# Patient Record
Sex: Female | Born: 1945 | Race: White | Hispanic: No | Marital: Married | Smoking: Former smoker
Health system: Southern US, Community
[De-identification: ages and names within clinical notes are randomized; demographics above are authoritative.]

## PROBLEM LIST (undated history)

## (undated) DIAGNOSIS — D509 Iron deficiency anemia, unspecified: Secondary | ICD-10-CM

## (undated) DIAGNOSIS — F329 Major depressive disorder, single episode, unspecified: Secondary | ICD-10-CM

## (undated) DIAGNOSIS — E785 Hyperlipidemia, unspecified: Secondary | ICD-10-CM

## (undated) DIAGNOSIS — Z8719 Personal history of other diseases of the digestive system: Secondary | ICD-10-CM

## (undated) DIAGNOSIS — C50019 Malignant neoplasm of nipple and areola, unspecified female breast: Secondary | ICD-10-CM

## (undated) DIAGNOSIS — N393 Stress incontinence (female) (male): Secondary | ICD-10-CM

## (undated) DIAGNOSIS — R21 Rash and other nonspecific skin eruption: Secondary | ICD-10-CM

## (undated) DIAGNOSIS — J449 Chronic obstructive pulmonary disease, unspecified: Secondary | ICD-10-CM

## (undated) DIAGNOSIS — I201 Angina pectoris with documented spasm: Secondary | ICD-10-CM

## (undated) DIAGNOSIS — S92912A Unspecified fracture of left toe(s), initial encounter for closed fracture: Secondary | ICD-10-CM

## (undated) DIAGNOSIS — I209 Angina pectoris, unspecified: Secondary | ICD-10-CM

## (undated) DIAGNOSIS — R0600 Dyspnea, unspecified: Secondary | ICD-10-CM

## (undated) DIAGNOSIS — D126 Benign neoplasm of colon, unspecified: Secondary | ICD-10-CM

## (undated) DIAGNOSIS — R7303 Prediabetes: Secondary | ICD-10-CM

## (undated) DIAGNOSIS — M810 Age-related osteoporosis without current pathological fracture: Secondary | ICD-10-CM

## (undated) DIAGNOSIS — I639 Cerebral infarction, unspecified: Secondary | ICD-10-CM

## (undated) DIAGNOSIS — F32A Depression, unspecified: Secondary | ICD-10-CM

## (undated) DIAGNOSIS — C50919 Malignant neoplasm of unspecified site of unspecified female breast: Secondary | ICD-10-CM

## (undated) DIAGNOSIS — K219 Gastro-esophageal reflux disease without esophagitis: Secondary | ICD-10-CM

## (undated) DIAGNOSIS — E669 Obesity, unspecified: Secondary | ICD-10-CM

## (undated) HISTORY — DX: Age-related osteoporosis without current pathological fracture: M81.0

## (undated) HISTORY — DX: Gastro-esophageal reflux disease without esophagitis: K21.9

## (undated) HISTORY — DX: Hyperlipidemia, unspecified: E78.5

## (undated) HISTORY — PX: CATARACT EXTRACTION: SUR2

## (undated) HISTORY — DX: Obesity, unspecified: E66.9

## (undated) HISTORY — DX: Major depressive disorder, single episode, unspecified: F32.9

## (undated) HISTORY — DX: Unspecified fracture of left toe(s), initial encounter for closed fracture: S92.912A

## (undated) HISTORY — DX: Malignant neoplasm of nipple and areola, unspecified female breast: C50.019

## (undated) HISTORY — PX: TONSILLECTOMY: SUR1361

## (undated) HISTORY — DX: Benign neoplasm of colon, unspecified: D12.6

## (undated) HISTORY — PX: RADIAL KERATOTOMY: SHX217

## (undated) HISTORY — DX: Iron deficiency anemia, unspecified: D50.9

## (undated) HISTORY — DX: Cerebral infarction, unspecified: I63.9

## (undated) HISTORY — DX: Depression, unspecified: F32.A

## (undated) SURGERY — Surgical Case
Anesthesia: *Unknown

---

## 1987-03-21 HISTORY — PX: ABDOMINAL HYSTERECTOMY: SHX81

## 2002-09-16 ENCOUNTER — Inpatient Hospital Stay (HOSPITAL_COMMUNITY): Admission: EM | Admit: 2002-09-16 | Discharge: 2002-09-18 | Payer: Self-pay | Admitting: Emergency Medicine

## 2002-09-16 ENCOUNTER — Encounter: Payer: Self-pay | Admitting: Emergency Medicine

## 2002-09-18 ENCOUNTER — Encounter: Payer: Self-pay | Admitting: Cardiology

## 2003-03-21 DIAGNOSIS — C50919 Malignant neoplasm of unspecified site of unspecified female breast: Secondary | ICD-10-CM

## 2003-03-21 HISTORY — DX: Malignant neoplasm of unspecified site of unspecified female breast: C50.919

## 2003-03-21 HISTORY — PX: MASTECTOMY: SHX3

## 2005-12-29 ENCOUNTER — Emergency Department (HOSPITAL_COMMUNITY): Admission: EM | Admit: 2005-12-29 | Discharge: 2005-12-29 | Payer: Self-pay | Admitting: Family Medicine

## 2011-03-10 ENCOUNTER — Encounter: Payer: Self-pay | Admitting: *Deleted

## 2011-03-10 ENCOUNTER — Emergency Department (INDEPENDENT_AMBULATORY_CARE_PROVIDER_SITE_OTHER)
Admission: EM | Admit: 2011-03-10 | Discharge: 2011-03-10 | Disposition: A | Discharge status: Home / Self Care | Payer: Federal, State, Local not specified - PPO | Source: Home / Self Care | Attending: Family Medicine | Admitting: Family Medicine

## 2011-03-10 DIAGNOSIS — H16002 Unspecified corneal ulcer, left eye: Secondary | ICD-10-CM

## 2011-03-10 DIAGNOSIS — H16009 Unspecified corneal ulcer, unspecified eye: Secondary | ICD-10-CM

## 2011-03-10 HISTORY — DX: Chronic obstructive pulmonary disease, unspecified: J44.9

## 2011-03-10 HISTORY — DX: Angina pectoris with documented spasm: I20.1

## 2011-03-10 HISTORY — DX: Malignant neoplasm of unspecified site of unspecified female breast: C50.919

## 2011-03-10 MED ORDER — ERYTHROMYCIN 5 MG/GM OP OINT: TOPICAL_OINTMENT | Freq: Every day | OPHTHALMIC | Status: AC

## 2011-03-10 MED ORDER — POLYMYXIN B-TRIMETHOPRIM 10000-0.1 UNIT/ML-% OP SOLN: 1.0000 [drp] | OPHTHALMIC | Status: AC

## 2011-03-10 NOTE — ED Provider Notes (Signed)
[name] is a 65 year old woman with left eye pain sensitivity and discharge. One month ago she had conjunctivitis of the left eye and has had symptoms off and on since then. She lives in [place] and is down here visiting family. She notes that her eye worsen dramatically this morning. She doesn't think she scratched it or to any foreign body in it. She has been using moisturizer eyedrops on her eyes. Her pertinent medical history for high is  Astigmatism, and  radial keratotomy.  Her past medical history significant for COPD, diabetes, Prinzmetal's angina and a history of breast cancer status post mastectomy.  She is a drug allergy to second-generation cephalosporins which cause a headache.  She feels well otherwise and has no change in her visual acuity. Additionally she denies any pain with eye motion and denies any fevers or chills.  PMH reviewed.  ROS as above otherwise neg Medications reviewed. No current facility-administered medications for this encounter.   Current Outpatient Prescriptions  Medication Sig Dispense Refill  . amLODipine (NORVASC) 10 MG tablet Take 10 mg by mouth daily.        Marland Kitchen aspirin 81 MG tablet Take 81 mg by mouth daily.        Marland Kitchen buPROPion (WELLBUTRIN) 100 MG tablet Take 100 mg by mouth daily.        . Calcium Carbonate-Vitamin D (OSCAL 500/200 D-3 PO) Take by mouth.        . ISOSORBIDE PO Take by mouth daily.        . metFORMIN (GLUCOPHAGE-XR) 500 MG 24 hr tablet Take 500 mg by mouth daily with breakfast. Take 2 daily       . metoprolol succinate (TOPROL-XL) 12.5 mg TB24 Take by mouth daily.        . Multiple Vitamins-Minerals (MULTIVITAMIN PO) Take by mouth.        . RABEprazole (ACIPHEX) 20 MG tablet Take 20 mg by mouth 2 (two) times daily.        . ramipril (ALTACE) 2.5 MG tablet Take 2.5 mg by mouth daily.        . rosuvastatin (CRESTOR) 40 MG tablet Take 40 mg by mouth daily.        Marland Kitchen erythromycin [facility]) ophthalmic ointment Place into the left eye at  bedtime.  3.5 g  0  . trimethoprim-polymyxin b (POLYTRIM) ophthalmic solution Place 1 drop into the left eye every 4 (four) hours.  10 mL  0     Exam:  BP 119/53  Pulse 86  Temp(Src) 98.6 F (37 C) (Oral)  Resp 16  SpO2 95% Gen: Well NAD HEENT: EOMI,  MMM EYE: Slitlamp exam reveals conjunctival injection of the medial aspect of the left eye. Additionally there is a small circular area on the cornea that has fluorescein uptake consistent with a corneal ulcer. The area is just inferior to the border of the iris in the 6:00 position . There is no dendritic pattern. No foreign body seen  Lungs: CTABL Nl WOB Heart: RRR no MRG Abd: NABS, NT, ND Exts: Non edematous BL  LE, warm and well perfused.    Assessment and plan: 65 year old woman with corneal ulcer, versus abrasion. Plan to treat with erythromycin ointment at night and Polytrim eye drops during the day.  I recommend that she followup with her ophthalmologist upon return to [place] in one week. If her pain worsens or she is changes in vision I recommend that she see an ophthalmologist sooner.  Handout on corneal  ulcer and red flag precautions reviewed with patient who expresses understanding.    Clementeen Graham 03/10/11 2016

## 2011-03-10 NOTE — ED Notes (Signed)
Had pink eye in left eye approx 1 month ago; "I've had problems with it ever since".  Has been using artificial tears, but irritation is getting worse in left eye.  C/O weeping eye w/ "gunky" drainage in AM.

## 2011-03-11 NOTE — ED Provider Notes (Signed)
Medical screening examination/treatment/procedure(s) were performed by [facility] family medicine resident physician and as supervising physician I was immediately available for consultation/collaboration.   [facility]; MD   Sharin Grave, MD 03/11/11 [phone]

## 2011-03-21 DIAGNOSIS — I639 Cerebral infarction, unspecified: Secondary | ICD-10-CM

## 2011-03-21 HISTORY — DX: Cerebral infarction, unspecified: I63.9

## 2011-04-19 DIAGNOSIS — E785 Hyperlipidemia, unspecified: Secondary | ICD-10-CM | POA: Diagnosis not present

## 2011-04-19 DIAGNOSIS — Z79899 Other long term (current) drug therapy: Secondary | ICD-10-CM | POA: Diagnosis not present

## 2011-04-25 DIAGNOSIS — K219 Gastro-esophageal reflux disease without esophagitis: Secondary | ICD-10-CM | POA: Diagnosis not present

## 2011-05-10 DIAGNOSIS — Z4682 Encounter for fitting and adjustment of non-vascular catheter: Secondary | ICD-10-CM | POA: Diagnosis not present

## 2011-05-10 DIAGNOSIS — Z880 Allergy status to penicillin: Secondary | ICD-10-CM | POA: Diagnosis not present

## 2011-05-10 DIAGNOSIS — G8190 Hemiplegia, unspecified affecting unspecified side: Secondary | ICD-10-CM | POA: Diagnosis not present

## 2011-05-10 DIAGNOSIS — D509 Iron deficiency anemia, unspecified: Secondary | ICD-10-CM | POA: Diagnosis present

## 2011-05-10 DIAGNOSIS — J449 Chronic obstructive pulmonary disease, unspecified: Secondary | ICD-10-CM | POA: Diagnosis present

## 2011-05-10 DIAGNOSIS — R29810 Facial weakness: Secondary | ICD-10-CM | POA: Diagnosis not present

## 2011-05-10 DIAGNOSIS — I6789 Other cerebrovascular disease: Secondary | ICD-10-CM | POA: Diagnosis not present

## 2011-05-10 DIAGNOSIS — I252 Old myocardial infarction: Secondary | ICD-10-CM | POA: Diagnosis not present

## 2011-05-10 DIAGNOSIS — E46 Unspecified protein-calorie malnutrition: Secondary | ICD-10-CM | POA: Diagnosis not present

## 2011-05-10 DIAGNOSIS — I10 Essential (primary) hypertension: Secondary | ICD-10-CM | POA: Diagnosis not present

## 2011-05-10 DIAGNOSIS — R4701 Aphasia: Secondary | ICD-10-CM | POA: Diagnosis not present

## 2011-05-10 DIAGNOSIS — I69959 Hemiplegia and hemiparesis following unspecified cerebrovascular disease affecting unspecified side: Secondary | ICD-10-CM | POA: Diagnosis not present

## 2011-05-10 DIAGNOSIS — K5730 Diverticulosis of large intestine without perforation or abscess without bleeding: Secondary | ICD-10-CM | POA: Diagnosis not present

## 2011-05-10 DIAGNOSIS — I669 Occlusion and stenosis of unspecified cerebral artery: Secondary | ICD-10-CM | POA: Diagnosis not present

## 2011-05-10 DIAGNOSIS — K922 Gastrointestinal hemorrhage, unspecified: Secondary | ICD-10-CM | POA: Diagnosis not present

## 2011-05-10 DIAGNOSIS — K219 Gastro-esophageal reflux disease without esophagitis: Secondary | ICD-10-CM | POA: Diagnosis present

## 2011-05-10 DIAGNOSIS — K449 Diaphragmatic hernia without obstruction or gangrene: Secondary | ICD-10-CM | POA: Diagnosis not present

## 2011-05-10 DIAGNOSIS — E785 Hyperlipidemia, unspecified: Secondary | ICD-10-CM | POA: Diagnosis not present

## 2011-05-10 DIAGNOSIS — Z8673 Personal history of transient ischemic attack (TIA), and cerebral infarction without residual deficits: Secondary | ICD-10-CM | POA: Diagnosis not present

## 2011-05-10 DIAGNOSIS — I6350 Cerebral infarction due to unspecified occlusion or stenosis of unspecified cerebral artery: Secondary | ICD-10-CM | POA: Diagnosis not present

## 2011-05-10 DIAGNOSIS — E119 Type 2 diabetes mellitus without complications: Secondary | ICD-10-CM | POA: Diagnosis not present

## 2011-05-10 DIAGNOSIS — R627 Adult failure to thrive: Secondary | ICD-10-CM | POA: Diagnosis not present

## 2011-05-10 DIAGNOSIS — R1310 Dysphagia, unspecified: Secondary | ICD-10-CM | POA: Diagnosis not present

## 2011-05-22 DIAGNOSIS — Z931 Gastrostomy status: Secondary | ICD-10-CM | POA: Diagnosis not present

## 2011-05-22 DIAGNOSIS — G8190 Hemiplegia, unspecified affecting unspecified side: Secondary | ICD-10-CM | POA: Diagnosis not present

## 2011-05-22 DIAGNOSIS — G519 Disorder of facial nerve, unspecified: Secondary | ICD-10-CM | POA: Diagnosis not present

## 2011-05-22 DIAGNOSIS — I69991 Dysphagia following unspecified cerebrovascular disease: Secondary | ICD-10-CM | POA: Diagnosis not present

## 2011-05-22 DIAGNOSIS — R1310 Dysphagia, unspecified: Secondary | ICD-10-CM | POA: Diagnosis not present

## 2011-05-22 DIAGNOSIS — J449 Chronic obstructive pulmonary disease, unspecified: Secondary | ICD-10-CM | POA: Diagnosis not present

## 2011-05-22 DIAGNOSIS — I252 Old myocardial infarction: Secondary | ICD-10-CM | POA: Diagnosis not present

## 2011-05-22 DIAGNOSIS — R4701 Aphasia: Secondary | ICD-10-CM | POA: Diagnosis not present

## 2011-05-22 DIAGNOSIS — I69920 Aphasia following unspecified cerebrovascular disease: Secondary | ICD-10-CM | POA: Diagnosis not present

## 2011-05-22 DIAGNOSIS — Z5189 Encounter for other specified aftercare: Secondary | ICD-10-CM | POA: Diagnosis not present

## 2011-05-22 DIAGNOSIS — I6350 Cerebral infarction due to unspecified occlusion or stenosis of unspecified cerebral artery: Secondary | ICD-10-CM | POA: Diagnosis not present

## 2011-05-22 DIAGNOSIS — D509 Iron deficiency anemia, unspecified: Secondary | ICD-10-CM | POA: Diagnosis not present

## 2011-05-22 DIAGNOSIS — K219 Gastro-esophageal reflux disease without esophagitis: Secondary | ICD-10-CM | POA: Diagnosis not present

## 2011-05-22 DIAGNOSIS — I2510 Atherosclerotic heart disease of native coronary artery without angina pectoris: Secondary | ICD-10-CM | POA: Diagnosis not present

## 2011-05-22 DIAGNOSIS — E119 Type 2 diabetes mellitus without complications: Secondary | ICD-10-CM | POA: Diagnosis not present

## 2011-05-22 DIAGNOSIS — I69922 Dysarthria following unspecified cerebrovascular disease: Secondary | ICD-10-CM | POA: Diagnosis not present

## 2011-05-22 DIAGNOSIS — I6789 Other cerebrovascular disease: Secondary | ICD-10-CM | POA: Diagnosis not present

## 2011-05-22 DIAGNOSIS — I69959 Hemiplegia and hemiparesis following unspecified cerebrovascular disease affecting unspecified side: Secondary | ICD-10-CM | POA: Diagnosis not present

## 2011-05-22 DIAGNOSIS — F329 Major depressive disorder, single episode, unspecified: Secondary | ICD-10-CM | POA: Diagnosis not present

## 2011-06-30 DIAGNOSIS — I10 Essential (primary) hypertension: Secondary | ICD-10-CM | POA: Diagnosis not present

## 2011-06-30 DIAGNOSIS — E785 Hyperlipidemia, unspecified: Secondary | ICD-10-CM | POA: Diagnosis not present

## 2011-06-30 DIAGNOSIS — I6789 Other cerebrovascular disease: Secondary | ICD-10-CM | POA: Diagnosis not present

## 2011-07-04 DIAGNOSIS — E119 Type 2 diabetes mellitus without complications: Secondary | ICD-10-CM | POA: Diagnosis not present

## 2011-07-04 DIAGNOSIS — I6789 Other cerebrovascular disease: Secondary | ICD-10-CM | POA: Diagnosis not present

## 2011-07-04 DIAGNOSIS — J449 Chronic obstructive pulmonary disease, unspecified: Secondary | ICD-10-CM | POA: Diagnosis not present

## 2011-07-04 DIAGNOSIS — R5381 Other malaise: Secondary | ICD-10-CM | POA: Diagnosis not present

## 2011-07-05 DIAGNOSIS — F329 Major depressive disorder, single episode, unspecified: Secondary | ICD-10-CM | POA: Diagnosis not present

## 2011-07-07 DIAGNOSIS — I6789 Other cerebrovascular disease: Secondary | ICD-10-CM | POA: Diagnosis not present

## 2011-07-07 DIAGNOSIS — R110 Nausea: Secondary | ICD-10-CM | POA: Diagnosis not present

## 2011-07-07 DIAGNOSIS — J449 Chronic obstructive pulmonary disease, unspecified: Secondary | ICD-10-CM | POA: Diagnosis not present

## 2011-07-07 DIAGNOSIS — E119 Type 2 diabetes mellitus without complications: Secondary | ICD-10-CM | POA: Diagnosis not present

## 2011-07-07 DIAGNOSIS — Z79899 Other long term (current) drug therapy: Secondary | ICD-10-CM | POA: Diagnosis not present

## 2011-07-10 DIAGNOSIS — I6789 Other cerebrovascular disease: Secondary | ICD-10-CM | POA: Diagnosis not present

## 2011-07-10 DIAGNOSIS — R5383 Other fatigue: Secondary | ICD-10-CM | POA: Diagnosis not present

## 2011-07-10 DIAGNOSIS — J449 Chronic obstructive pulmonary disease, unspecified: Secondary | ICD-10-CM | POA: Diagnosis not present

## 2011-07-10 DIAGNOSIS — E119 Type 2 diabetes mellitus without complications: Secondary | ICD-10-CM | POA: Diagnosis not present

## 2011-07-10 DIAGNOSIS — R5381 Other malaise: Secondary | ICD-10-CM | POA: Diagnosis not present

## 2011-07-12 DIAGNOSIS — F0151 Vascular dementia with behavioral disturbance: Secondary | ICD-10-CM | POA: Diagnosis not present

## 2011-07-14 DIAGNOSIS — I10 Essential (primary) hypertension: Secondary | ICD-10-CM | POA: Diagnosis not present

## 2011-07-14 DIAGNOSIS — I6789 Other cerebrovascular disease: Secondary | ICD-10-CM | POA: Diagnosis not present

## 2011-07-14 DIAGNOSIS — I2510 Atherosclerotic heart disease of native coronary artery without angina pectoris: Secondary | ICD-10-CM | POA: Diagnosis not present

## 2011-07-14 DIAGNOSIS — J449 Chronic obstructive pulmonary disease, unspecified: Secondary | ICD-10-CM | POA: Diagnosis not present

## 2011-07-19 DIAGNOSIS — J449 Chronic obstructive pulmonary disease, unspecified: Secondary | ICD-10-CM | POA: Diagnosis not present

## 2011-07-19 DIAGNOSIS — I6789 Other cerebrovascular disease: Secondary | ICD-10-CM | POA: Diagnosis not present

## 2011-07-19 DIAGNOSIS — E119 Type 2 diabetes mellitus without complications: Secondary | ICD-10-CM | POA: Diagnosis not present

## 2011-07-19 DIAGNOSIS — F0151 Vascular dementia with behavioral disturbance: Secondary | ICD-10-CM | POA: Diagnosis not present

## 2011-07-19 DIAGNOSIS — R110 Nausea: Secondary | ICD-10-CM | POA: Diagnosis not present

## 2011-07-26 DIAGNOSIS — E119 Type 2 diabetes mellitus without complications: Secondary | ICD-10-CM | POA: Diagnosis not present

## 2011-07-26 DIAGNOSIS — I6789 Other cerebrovascular disease: Secondary | ICD-10-CM | POA: Diagnosis not present

## 2011-07-26 DIAGNOSIS — H1045 Other chronic allergic conjunctivitis: Secondary | ICD-10-CM | POA: Diagnosis not present

## 2011-07-26 DIAGNOSIS — J449 Chronic obstructive pulmonary disease, unspecified: Secondary | ICD-10-CM | POA: Diagnosis not present

## 2011-07-27 DIAGNOSIS — J449 Chronic obstructive pulmonary disease, unspecified: Secondary | ICD-10-CM | POA: Diagnosis not present

## 2011-07-27 DIAGNOSIS — E119 Type 2 diabetes mellitus without complications: Secondary | ICD-10-CM | POA: Diagnosis not present

## 2011-07-27 DIAGNOSIS — H1045 Other chronic allergic conjunctivitis: Secondary | ICD-10-CM | POA: Diagnosis not present

## 2011-07-27 DIAGNOSIS — I6789 Other cerebrovascular disease: Secondary | ICD-10-CM | POA: Diagnosis not present

## 2011-08-01 DIAGNOSIS — F329 Major depressive disorder, single episode, unspecified: Secondary | ICD-10-CM | POA: Diagnosis not present

## 2011-08-01 DIAGNOSIS — I6789 Other cerebrovascular disease: Secondary | ICD-10-CM | POA: Diagnosis not present

## 2011-08-02 DIAGNOSIS — R5383 Other fatigue: Secondary | ICD-10-CM | POA: Diagnosis not present

## 2011-08-02 DIAGNOSIS — E119 Type 2 diabetes mellitus without complications: Secondary | ICD-10-CM | POA: Diagnosis not present

## 2011-08-02 DIAGNOSIS — R609 Edema, unspecified: Secondary | ICD-10-CM | POA: Diagnosis not present

## 2011-08-02 DIAGNOSIS — I6789 Other cerebrovascular disease: Secondary | ICD-10-CM | POA: Diagnosis not present

## 2011-08-08 DIAGNOSIS — R5381 Other malaise: Secondary | ICD-10-CM | POA: Diagnosis not present

## 2011-08-08 DIAGNOSIS — R609 Edema, unspecified: Secondary | ICD-10-CM | POA: Diagnosis not present

## 2011-08-08 DIAGNOSIS — I6789 Other cerebrovascular disease: Secondary | ICD-10-CM | POA: Diagnosis not present

## 2011-08-08 DIAGNOSIS — E119 Type 2 diabetes mellitus without complications: Secondary | ICD-10-CM | POA: Diagnosis not present

## 2011-08-08 DIAGNOSIS — R5383 Other fatigue: Secondary | ICD-10-CM | POA: Diagnosis not present

## 2011-08-11 DIAGNOSIS — H04129 Dry eye syndrome of unspecified lacrimal gland: Secondary | ICD-10-CM | POA: Diagnosis not present

## 2011-08-11 DIAGNOSIS — H02409 Unspecified ptosis of unspecified eyelid: Secondary | ICD-10-CM | POA: Diagnosis not present

## 2011-08-11 DIAGNOSIS — H179 Unspecified corneal scar and opacity: Secondary | ICD-10-CM | POA: Diagnosis not present

## 2011-08-11 DIAGNOSIS — H2510 Age-related nuclear cataract, unspecified eye: Secondary | ICD-10-CM | POA: Diagnosis not present

## 2011-08-11 DIAGNOSIS — H01009 Unspecified blepharitis unspecified eye, unspecified eyelid: Secondary | ICD-10-CM | POA: Diagnosis not present

## 2011-08-11 DIAGNOSIS — H16269 Vernal keratoconjunctivitis, with limbar and corneal involvement, unspecified eye: Secondary | ICD-10-CM | POA: Diagnosis not present

## 2011-08-15 DIAGNOSIS — E119 Type 2 diabetes mellitus without complications: Secondary | ICD-10-CM | POA: Diagnosis not present

## 2011-08-15 DIAGNOSIS — R609 Edema, unspecified: Secondary | ICD-10-CM | POA: Diagnosis not present

## 2011-08-15 DIAGNOSIS — I6789 Other cerebrovascular disease: Secondary | ICD-10-CM | POA: Diagnosis not present

## 2011-08-15 DIAGNOSIS — R5381 Other malaise: Secondary | ICD-10-CM | POA: Diagnosis not present

## 2011-08-17 DIAGNOSIS — H16129 Filamentary keratitis, unspecified eye: Secondary | ICD-10-CM | POA: Diagnosis not present

## 2011-08-17 DIAGNOSIS — H2510 Age-related nuclear cataract, unspecified eye: Secondary | ICD-10-CM | POA: Diagnosis not present

## 2011-08-17 DIAGNOSIS — H01009 Unspecified blepharitis unspecified eye, unspecified eyelid: Secondary | ICD-10-CM | POA: Diagnosis not present

## 2011-08-17 DIAGNOSIS — H179 Unspecified corneal scar and opacity: Secondary | ICD-10-CM | POA: Diagnosis not present

## 2011-08-17 DIAGNOSIS — H04129 Dry eye syndrome of unspecified lacrimal gland: Secondary | ICD-10-CM | POA: Diagnosis not present

## 2011-08-21 DIAGNOSIS — Z993 Dependence on wheelchair: Secondary | ICD-10-CM | POA: Diagnosis not present

## 2011-08-21 DIAGNOSIS — I69959 Hemiplegia and hemiparesis following unspecified cerebrovascular disease affecting unspecified side: Secondary | ICD-10-CM | POA: Diagnosis not present

## 2011-08-21 DIAGNOSIS — I2510 Atherosclerotic heart disease of native coronary artery without angina pectoris: Secondary | ICD-10-CM | POA: Diagnosis not present

## 2011-08-21 DIAGNOSIS — R5381 Other malaise: Secondary | ICD-10-CM | POA: Diagnosis not present

## 2011-08-21 DIAGNOSIS — M79609 Pain in unspecified limb: Secondary | ICD-10-CM | POA: Diagnosis not present

## 2011-08-21 DIAGNOSIS — E785 Hyperlipidemia, unspecified: Secondary | ICD-10-CM | POA: Diagnosis not present

## 2011-08-21 DIAGNOSIS — R5383 Other fatigue: Secondary | ICD-10-CM | POA: Diagnosis not present

## 2011-08-22 DIAGNOSIS — I69959 Hemiplegia and hemiparesis following unspecified cerebrovascular disease affecting unspecified side: Secondary | ICD-10-CM | POA: Diagnosis not present

## 2011-08-22 DIAGNOSIS — Z993 Dependence on wheelchair: Secondary | ICD-10-CM | POA: Diagnosis not present

## 2011-08-22 DIAGNOSIS — R5383 Other fatigue: Secondary | ICD-10-CM | POA: Diagnosis not present

## 2011-08-22 DIAGNOSIS — M79609 Pain in unspecified limb: Secondary | ICD-10-CM | POA: Diagnosis not present

## 2011-08-22 DIAGNOSIS — E785 Hyperlipidemia, unspecified: Secondary | ICD-10-CM | POA: Diagnosis not present

## 2011-08-22 DIAGNOSIS — I2510 Atherosclerotic heart disease of native coronary artery without angina pectoris: Secondary | ICD-10-CM | POA: Diagnosis not present

## 2011-08-23 DIAGNOSIS — I2510 Atherosclerotic heart disease of native coronary artery without angina pectoris: Secondary | ICD-10-CM | POA: Diagnosis not present

## 2011-08-23 DIAGNOSIS — Z993 Dependence on wheelchair: Secondary | ICD-10-CM | POA: Diagnosis not present

## 2011-08-23 DIAGNOSIS — M79609 Pain in unspecified limb: Secondary | ICD-10-CM | POA: Diagnosis not present

## 2011-08-23 DIAGNOSIS — R5383 Other fatigue: Secondary | ICD-10-CM | POA: Diagnosis not present

## 2011-08-23 DIAGNOSIS — I69959 Hemiplegia and hemiparesis following unspecified cerebrovascular disease affecting unspecified side: Secondary | ICD-10-CM | POA: Diagnosis not present

## 2011-08-23 DIAGNOSIS — E785 Hyperlipidemia, unspecified: Secondary | ICD-10-CM | POA: Diagnosis not present

## 2011-08-24 DIAGNOSIS — I2510 Atherosclerotic heart disease of native coronary artery without angina pectoris: Secondary | ICD-10-CM | POA: Diagnosis not present

## 2011-08-24 DIAGNOSIS — E785 Hyperlipidemia, unspecified: Secondary | ICD-10-CM | POA: Diagnosis not present

## 2011-08-24 DIAGNOSIS — R5381 Other malaise: Secondary | ICD-10-CM | POA: Diagnosis not present

## 2011-08-24 DIAGNOSIS — I69959 Hemiplegia and hemiparesis following unspecified cerebrovascular disease affecting unspecified side: Secondary | ICD-10-CM | POA: Diagnosis not present

## 2011-08-24 DIAGNOSIS — I6789 Other cerebrovascular disease: Secondary | ICD-10-CM | POA: Diagnosis not present

## 2011-08-24 DIAGNOSIS — M79609 Pain in unspecified limb: Secondary | ICD-10-CM | POA: Diagnosis not present

## 2011-08-24 DIAGNOSIS — Z993 Dependence on wheelchair: Secondary | ICD-10-CM | POA: Diagnosis not present

## 2011-08-28 DIAGNOSIS — Z993 Dependence on wheelchair: Secondary | ICD-10-CM | POA: Diagnosis not present

## 2011-08-28 DIAGNOSIS — E785 Hyperlipidemia, unspecified: Secondary | ICD-10-CM | POA: Diagnosis not present

## 2011-08-28 DIAGNOSIS — I69959 Hemiplegia and hemiparesis following unspecified cerebrovascular disease affecting unspecified side: Secondary | ICD-10-CM | POA: Diagnosis not present

## 2011-08-28 DIAGNOSIS — I2510 Atherosclerotic heart disease of native coronary artery without angina pectoris: Secondary | ICD-10-CM | POA: Diagnosis not present

## 2011-08-28 DIAGNOSIS — R5383 Other fatigue: Secondary | ICD-10-CM | POA: Diagnosis not present

## 2011-08-28 DIAGNOSIS — M79609 Pain in unspecified limb: Secondary | ICD-10-CM | POA: Diagnosis not present

## 2011-08-29 DIAGNOSIS — R5383 Other fatigue: Secondary | ICD-10-CM | POA: Diagnosis not present

## 2011-08-29 DIAGNOSIS — E785 Hyperlipidemia, unspecified: Secondary | ICD-10-CM | POA: Diagnosis not present

## 2011-08-29 DIAGNOSIS — Z993 Dependence on wheelchair: Secondary | ICD-10-CM | POA: Diagnosis not present

## 2011-08-29 DIAGNOSIS — I2510 Atherosclerotic heart disease of native coronary artery without angina pectoris: Secondary | ICD-10-CM | POA: Diagnosis not present

## 2011-08-29 DIAGNOSIS — M79609 Pain in unspecified limb: Secondary | ICD-10-CM | POA: Diagnosis not present

## 2011-08-29 DIAGNOSIS — I69959 Hemiplegia and hemiparesis following unspecified cerebrovascular disease affecting unspecified side: Secondary | ICD-10-CM | POA: Diagnosis not present

## 2011-08-30 DIAGNOSIS — Z993 Dependence on wheelchair: Secondary | ICD-10-CM | POA: Diagnosis not present

## 2011-08-30 DIAGNOSIS — E785 Hyperlipidemia, unspecified: Secondary | ICD-10-CM | POA: Diagnosis not present

## 2011-08-30 DIAGNOSIS — M79609 Pain in unspecified limb: Secondary | ICD-10-CM | POA: Diagnosis not present

## 2011-08-30 DIAGNOSIS — R5383 Other fatigue: Secondary | ICD-10-CM | POA: Diagnosis not present

## 2011-08-30 DIAGNOSIS — I69959 Hemiplegia and hemiparesis following unspecified cerebrovascular disease affecting unspecified side: Secondary | ICD-10-CM | POA: Diagnosis not present

## 2011-08-30 DIAGNOSIS — I2510 Atherosclerotic heart disease of native coronary artery without angina pectoris: Secondary | ICD-10-CM | POA: Diagnosis not present

## 2011-08-31 DIAGNOSIS — I69959 Hemiplegia and hemiparesis following unspecified cerebrovascular disease affecting unspecified side: Secondary | ICD-10-CM | POA: Diagnosis not present

## 2011-08-31 DIAGNOSIS — I2510 Atherosclerotic heart disease of native coronary artery without angina pectoris: Secondary | ICD-10-CM | POA: Diagnosis not present

## 2011-08-31 DIAGNOSIS — R5381 Other malaise: Secondary | ICD-10-CM | POA: Diagnosis not present

## 2011-08-31 DIAGNOSIS — Z993 Dependence on wheelchair: Secondary | ICD-10-CM | POA: Diagnosis not present

## 2011-08-31 DIAGNOSIS — M79609 Pain in unspecified limb: Secondary | ICD-10-CM | POA: Diagnosis not present

## 2011-08-31 DIAGNOSIS — E785 Hyperlipidemia, unspecified: Secondary | ICD-10-CM | POA: Diagnosis not present

## 2011-09-04 DIAGNOSIS — E559 Vitamin D deficiency, unspecified: Secondary | ICD-10-CM | POA: Diagnosis not present

## 2011-09-04 DIAGNOSIS — I6789 Other cerebrovascular disease: Secondary | ICD-10-CM | POA: Diagnosis not present

## 2011-09-04 DIAGNOSIS — R7309 Other abnormal glucose: Secondary | ICD-10-CM | POA: Diagnosis not present

## 2011-09-04 DIAGNOSIS — E538 Deficiency of other specified B group vitamins: Secondary | ICD-10-CM | POA: Diagnosis not present

## 2011-09-04 DIAGNOSIS — E785 Hyperlipidemia, unspecified: Secondary | ICD-10-CM | POA: Diagnosis not present

## 2011-09-05 DIAGNOSIS — Z993 Dependence on wheelchair: Secondary | ICD-10-CM | POA: Diagnosis not present

## 2011-09-05 DIAGNOSIS — I69959 Hemiplegia and hemiparesis following unspecified cerebrovascular disease affecting unspecified side: Secondary | ICD-10-CM | POA: Diagnosis not present

## 2011-09-05 DIAGNOSIS — R5383 Other fatigue: Secondary | ICD-10-CM | POA: Diagnosis not present

## 2011-09-05 DIAGNOSIS — E785 Hyperlipidemia, unspecified: Secondary | ICD-10-CM | POA: Diagnosis not present

## 2011-09-05 DIAGNOSIS — M79609 Pain in unspecified limb: Secondary | ICD-10-CM | POA: Diagnosis not present

## 2011-09-05 DIAGNOSIS — I2510 Atherosclerotic heart disease of native coronary artery without angina pectoris: Secondary | ICD-10-CM | POA: Diagnosis not present

## 2011-09-07 DIAGNOSIS — H01009 Unspecified blepharitis unspecified eye, unspecified eyelid: Secondary | ICD-10-CM | POA: Diagnosis not present

## 2011-09-07 DIAGNOSIS — H04129 Dry eye syndrome of unspecified lacrimal gland: Secondary | ICD-10-CM | POA: Diagnosis not present

## 2011-09-07 DIAGNOSIS — I2510 Atherosclerotic heart disease of native coronary artery without angina pectoris: Secondary | ICD-10-CM | POA: Diagnosis not present

## 2011-09-07 DIAGNOSIS — R5383 Other fatigue: Secondary | ICD-10-CM | POA: Diagnosis not present

## 2011-09-07 DIAGNOSIS — Z993 Dependence on wheelchair: Secondary | ICD-10-CM | POA: Diagnosis not present

## 2011-09-07 DIAGNOSIS — H2510 Age-related nuclear cataract, unspecified eye: Secondary | ICD-10-CM | POA: Diagnosis not present

## 2011-09-07 DIAGNOSIS — I69959 Hemiplegia and hemiparesis following unspecified cerebrovascular disease affecting unspecified side: Secondary | ICD-10-CM | POA: Diagnosis not present

## 2011-09-07 DIAGNOSIS — R5381 Other malaise: Secondary | ICD-10-CM | POA: Diagnosis not present

## 2011-09-07 DIAGNOSIS — E785 Hyperlipidemia, unspecified: Secondary | ICD-10-CM | POA: Diagnosis not present

## 2011-09-07 DIAGNOSIS — M79609 Pain in unspecified limb: Secondary | ICD-10-CM | POA: Diagnosis not present

## 2011-09-08 DIAGNOSIS — R5383 Other fatigue: Secondary | ICD-10-CM | POA: Diagnosis not present

## 2011-09-08 DIAGNOSIS — I69959 Hemiplegia and hemiparesis following unspecified cerebrovascular disease affecting unspecified side: Secondary | ICD-10-CM | POA: Diagnosis not present

## 2011-09-08 DIAGNOSIS — I2510 Atherosclerotic heart disease of native coronary artery without angina pectoris: Secondary | ICD-10-CM | POA: Diagnosis not present

## 2011-09-08 DIAGNOSIS — Z993 Dependence on wheelchair: Secondary | ICD-10-CM | POA: Diagnosis not present

## 2011-09-08 DIAGNOSIS — E785 Hyperlipidemia, unspecified: Secondary | ICD-10-CM | POA: Diagnosis not present

## 2011-09-08 DIAGNOSIS — M79609 Pain in unspecified limb: Secondary | ICD-10-CM | POA: Diagnosis not present

## 2011-09-11 DIAGNOSIS — Z993 Dependence on wheelchair: Secondary | ICD-10-CM | POA: Diagnosis not present

## 2011-09-11 DIAGNOSIS — I2510 Atherosclerotic heart disease of native coronary artery without angina pectoris: Secondary | ICD-10-CM | POA: Diagnosis not present

## 2011-09-11 DIAGNOSIS — I69959 Hemiplegia and hemiparesis following unspecified cerebrovascular disease affecting unspecified side: Secondary | ICD-10-CM | POA: Diagnosis not present

## 2011-09-11 DIAGNOSIS — E119 Type 2 diabetes mellitus without complications: Secondary | ICD-10-CM | POA: Diagnosis not present

## 2011-09-11 DIAGNOSIS — M79609 Pain in unspecified limb: Secondary | ICD-10-CM | POA: Diagnosis not present

## 2011-09-11 DIAGNOSIS — E785 Hyperlipidemia, unspecified: Secondary | ICD-10-CM | POA: Diagnosis not present

## 2011-09-11 DIAGNOSIS — R5381 Other malaise: Secondary | ICD-10-CM | POA: Diagnosis not present

## 2011-09-12 DIAGNOSIS — I2510 Atherosclerotic heart disease of native coronary artery without angina pectoris: Secondary | ICD-10-CM | POA: Diagnosis not present

## 2011-09-12 DIAGNOSIS — I69959 Hemiplegia and hemiparesis following unspecified cerebrovascular disease affecting unspecified side: Secondary | ICD-10-CM | POA: Diagnosis not present

## 2011-09-12 DIAGNOSIS — M79609 Pain in unspecified limb: Secondary | ICD-10-CM | POA: Diagnosis not present

## 2011-09-12 DIAGNOSIS — R5381 Other malaise: Secondary | ICD-10-CM | POA: Diagnosis not present

## 2011-09-12 DIAGNOSIS — E785 Hyperlipidemia, unspecified: Secondary | ICD-10-CM | POA: Diagnosis not present

## 2011-09-12 DIAGNOSIS — R5383 Other fatigue: Secondary | ICD-10-CM | POA: Diagnosis not present

## 2011-09-12 DIAGNOSIS — Z993 Dependence on wheelchair: Secondary | ICD-10-CM | POA: Diagnosis not present

## 2011-09-15 DIAGNOSIS — I2510 Atherosclerotic heart disease of native coronary artery without angina pectoris: Secondary | ICD-10-CM | POA: Diagnosis not present

## 2011-09-15 DIAGNOSIS — I69959 Hemiplegia and hemiparesis following unspecified cerebrovascular disease affecting unspecified side: Secondary | ICD-10-CM | POA: Diagnosis not present

## 2011-09-15 DIAGNOSIS — M79609 Pain in unspecified limb: Secondary | ICD-10-CM | POA: Diagnosis not present

## 2011-09-15 DIAGNOSIS — Z993 Dependence on wheelchair: Secondary | ICD-10-CM | POA: Diagnosis not present

## 2011-09-15 DIAGNOSIS — R5383 Other fatigue: Secondary | ICD-10-CM | POA: Diagnosis not present

## 2011-09-15 DIAGNOSIS — E785 Hyperlipidemia, unspecified: Secondary | ICD-10-CM | POA: Diagnosis not present

## 2011-09-25 DIAGNOSIS — I10 Essential (primary) hypertension: Secondary | ICD-10-CM | POA: Diagnosis not present

## 2011-09-25 DIAGNOSIS — I252 Old myocardial infarction: Secondary | ICD-10-CM | POA: Diagnosis not present

## 2011-09-25 DIAGNOSIS — I69959 Hemiplegia and hemiparesis following unspecified cerebrovascular disease affecting unspecified side: Secondary | ICD-10-CM | POA: Diagnosis not present

## 2011-09-25 DIAGNOSIS — I69993 Ataxia following unspecified cerebrovascular disease: Secondary | ICD-10-CM | POA: Diagnosis not present

## 2011-09-25 DIAGNOSIS — R262 Difficulty in walking, not elsewhere classified: Secondary | ICD-10-CM | POA: Diagnosis not present

## 2011-09-25 DIAGNOSIS — I2510 Atherosclerotic heart disease of native coronary artery without angina pectoris: Secondary | ICD-10-CM | POA: Diagnosis not present

## 2011-09-25 DIAGNOSIS — R269 Unspecified abnormalities of gait and mobility: Secondary | ICD-10-CM | POA: Diagnosis not present

## 2011-09-25 DIAGNOSIS — I69998 Other sequelae following unspecified cerebrovascular disease: Secondary | ICD-10-CM | POA: Diagnosis not present

## 2011-09-25 DIAGNOSIS — M6281 Muscle weakness (generalized): Secondary | ICD-10-CM | POA: Diagnosis not present

## 2011-09-25 DIAGNOSIS — IMO0001 Reserved for inherently not codable concepts without codable children: Secondary | ICD-10-CM | POA: Diagnosis not present

## 2011-09-25 DIAGNOSIS — E785 Hyperlipidemia, unspecified: Secondary | ICD-10-CM | POA: Diagnosis not present

## 2011-10-03 DIAGNOSIS — R262 Difficulty in walking, not elsewhere classified: Secondary | ICD-10-CM | POA: Diagnosis not present

## 2011-10-03 DIAGNOSIS — I69998 Other sequelae following unspecified cerebrovascular disease: Secondary | ICD-10-CM | POA: Diagnosis not present

## 2011-10-03 DIAGNOSIS — M6281 Muscle weakness (generalized): Secondary | ICD-10-CM | POA: Diagnosis not present

## 2011-10-03 DIAGNOSIS — IMO0001 Reserved for inherently not codable concepts without codable children: Secondary | ICD-10-CM | POA: Diagnosis not present

## 2011-10-03 DIAGNOSIS — R269 Unspecified abnormalities of gait and mobility: Secondary | ICD-10-CM | POA: Diagnosis not present

## 2011-10-03 DIAGNOSIS — I69993 Ataxia following unspecified cerebrovascular disease: Secondary | ICD-10-CM | POA: Diagnosis not present

## 2011-10-05 DIAGNOSIS — I69993 Ataxia following unspecified cerebrovascular disease: Secondary | ICD-10-CM | POA: Diagnosis not present

## 2011-10-05 DIAGNOSIS — IMO0001 Reserved for inherently not codable concepts without codable children: Secondary | ICD-10-CM | POA: Diagnosis not present

## 2011-10-05 DIAGNOSIS — I69998 Other sequelae following unspecified cerebrovascular disease: Secondary | ICD-10-CM | POA: Diagnosis not present

## 2011-10-05 DIAGNOSIS — M6281 Muscle weakness (generalized): Secondary | ICD-10-CM | POA: Diagnosis not present

## 2011-10-05 DIAGNOSIS — R269 Unspecified abnormalities of gait and mobility: Secondary | ICD-10-CM | POA: Diagnosis not present

## 2011-10-05 DIAGNOSIS — R262 Difficulty in walking, not elsewhere classified: Secondary | ICD-10-CM | POA: Diagnosis not present

## 2011-10-06 DIAGNOSIS — I69998 Other sequelae following unspecified cerebrovascular disease: Secondary | ICD-10-CM | POA: Diagnosis not present

## 2011-10-06 DIAGNOSIS — I69993 Ataxia following unspecified cerebrovascular disease: Secondary | ICD-10-CM | POA: Diagnosis not present

## 2011-10-06 DIAGNOSIS — IMO0001 Reserved for inherently not codable concepts without codable children: Secondary | ICD-10-CM | POA: Diagnosis not present

## 2011-10-06 DIAGNOSIS — M6281 Muscle weakness (generalized): Secondary | ICD-10-CM | POA: Diagnosis not present

## 2011-10-06 DIAGNOSIS — R269 Unspecified abnormalities of gait and mobility: Secondary | ICD-10-CM | POA: Diagnosis not present

## 2011-10-06 DIAGNOSIS — R262 Difficulty in walking, not elsewhere classified: Secondary | ICD-10-CM | POA: Diagnosis not present

## 2011-10-09 DIAGNOSIS — E785 Hyperlipidemia, unspecified: Secondary | ICD-10-CM | POA: Diagnosis not present

## 2011-10-10 DIAGNOSIS — R262 Difficulty in walking, not elsewhere classified: Secondary | ICD-10-CM | POA: Diagnosis not present

## 2011-10-10 DIAGNOSIS — I69998 Other sequelae following unspecified cerebrovascular disease: Secondary | ICD-10-CM | POA: Diagnosis not present

## 2011-10-10 DIAGNOSIS — M6281 Muscle weakness (generalized): Secondary | ICD-10-CM | POA: Diagnosis not present

## 2011-10-10 DIAGNOSIS — R269 Unspecified abnormalities of gait and mobility: Secondary | ICD-10-CM | POA: Diagnosis not present

## 2011-10-10 DIAGNOSIS — I69993 Ataxia following unspecified cerebrovascular disease: Secondary | ICD-10-CM | POA: Diagnosis not present

## 2011-10-10 DIAGNOSIS — IMO0001 Reserved for inherently not codable concepts without codable children: Secondary | ICD-10-CM | POA: Diagnosis not present

## 2011-10-12 DIAGNOSIS — I69998 Other sequelae following unspecified cerebrovascular disease: Secondary | ICD-10-CM | POA: Diagnosis not present

## 2011-10-12 DIAGNOSIS — R262 Difficulty in walking, not elsewhere classified: Secondary | ICD-10-CM | POA: Diagnosis not present

## 2011-10-12 DIAGNOSIS — M6281 Muscle weakness (generalized): Secondary | ICD-10-CM | POA: Diagnosis not present

## 2011-10-12 DIAGNOSIS — R269 Unspecified abnormalities of gait and mobility: Secondary | ICD-10-CM | POA: Diagnosis not present

## 2011-10-12 DIAGNOSIS — I69993 Ataxia following unspecified cerebrovascular disease: Secondary | ICD-10-CM | POA: Diagnosis not present

## 2011-10-12 DIAGNOSIS — IMO0001 Reserved for inherently not codable concepts without codable children: Secondary | ICD-10-CM | POA: Diagnosis not present

## 2011-10-13 DIAGNOSIS — I69993 Ataxia following unspecified cerebrovascular disease: Secondary | ICD-10-CM | POA: Diagnosis not present

## 2011-10-13 DIAGNOSIS — R262 Difficulty in walking, not elsewhere classified: Secondary | ICD-10-CM | POA: Diagnosis not present

## 2011-10-13 DIAGNOSIS — R269 Unspecified abnormalities of gait and mobility: Secondary | ICD-10-CM | POA: Diagnosis not present

## 2011-10-13 DIAGNOSIS — I69998 Other sequelae following unspecified cerebrovascular disease: Secondary | ICD-10-CM | POA: Diagnosis not present

## 2011-10-13 DIAGNOSIS — M6281 Muscle weakness (generalized): Secondary | ICD-10-CM | POA: Diagnosis not present

## 2011-10-13 DIAGNOSIS — IMO0001 Reserved for inherently not codable concepts without codable children: Secondary | ICD-10-CM | POA: Diagnosis not present

## 2011-10-17 DIAGNOSIS — R269 Unspecified abnormalities of gait and mobility: Secondary | ICD-10-CM | POA: Diagnosis not present

## 2011-10-17 DIAGNOSIS — M6281 Muscle weakness (generalized): Secondary | ICD-10-CM | POA: Diagnosis not present

## 2011-10-17 DIAGNOSIS — R262 Difficulty in walking, not elsewhere classified: Secondary | ICD-10-CM | POA: Diagnosis not present

## 2011-10-17 DIAGNOSIS — IMO0001 Reserved for inherently not codable concepts without codable children: Secondary | ICD-10-CM | POA: Diagnosis not present

## 2011-10-17 DIAGNOSIS — I69993 Ataxia following unspecified cerebrovascular disease: Secondary | ICD-10-CM | POA: Diagnosis not present

## 2011-10-17 DIAGNOSIS — I69998 Other sequelae following unspecified cerebrovascular disease: Secondary | ICD-10-CM | POA: Diagnosis not present

## 2011-10-18 DIAGNOSIS — I69993 Ataxia following unspecified cerebrovascular disease: Secondary | ICD-10-CM | POA: Diagnosis not present

## 2011-10-18 DIAGNOSIS — I69998 Other sequelae following unspecified cerebrovascular disease: Secondary | ICD-10-CM | POA: Diagnosis not present

## 2011-10-18 DIAGNOSIS — R269 Unspecified abnormalities of gait and mobility: Secondary | ICD-10-CM | POA: Diagnosis not present

## 2011-10-18 DIAGNOSIS — IMO0001 Reserved for inherently not codable concepts without codable children: Secondary | ICD-10-CM | POA: Diagnosis not present

## 2011-10-18 DIAGNOSIS — M6281 Muscle weakness (generalized): Secondary | ICD-10-CM | POA: Diagnosis not present

## 2011-10-18 DIAGNOSIS — R262 Difficulty in walking, not elsewhere classified: Secondary | ICD-10-CM | POA: Diagnosis not present

## 2011-10-20 DIAGNOSIS — R269 Unspecified abnormalities of gait and mobility: Secondary | ICD-10-CM | POA: Diagnosis not present

## 2011-10-20 DIAGNOSIS — I69959 Hemiplegia and hemiparesis following unspecified cerebrovascular disease affecting unspecified side: Secondary | ICD-10-CM | POA: Diagnosis not present

## 2011-10-20 DIAGNOSIS — IMO0001 Reserved for inherently not codable concepts without codable children: Secondary | ICD-10-CM | POA: Diagnosis not present

## 2011-10-20 DIAGNOSIS — I69993 Ataxia following unspecified cerebrovascular disease: Secondary | ICD-10-CM | POA: Diagnosis not present

## 2011-10-20 DIAGNOSIS — M6289 Other specified disorders of muscle: Secondary | ICD-10-CM | POA: Diagnosis not present

## 2011-10-20 DIAGNOSIS — M6281 Muscle weakness (generalized): Secondary | ICD-10-CM | POA: Diagnosis not present

## 2011-10-20 DIAGNOSIS — R262 Difficulty in walking, not elsewhere classified: Secondary | ICD-10-CM | POA: Diagnosis not present

## 2011-10-20 DIAGNOSIS — I69998 Other sequelae following unspecified cerebrovascular disease: Secondary | ICD-10-CM | POA: Diagnosis not present

## 2011-10-24 DIAGNOSIS — M6289 Other specified disorders of muscle: Secondary | ICD-10-CM | POA: Diagnosis not present

## 2011-10-24 DIAGNOSIS — M6281 Muscle weakness (generalized): Secondary | ICD-10-CM | POA: Diagnosis not present

## 2011-10-24 DIAGNOSIS — I69998 Other sequelae following unspecified cerebrovascular disease: Secondary | ICD-10-CM | POA: Diagnosis not present

## 2011-10-24 DIAGNOSIS — R262 Difficulty in walking, not elsewhere classified: Secondary | ICD-10-CM | POA: Diagnosis not present

## 2011-10-24 DIAGNOSIS — R269 Unspecified abnormalities of gait and mobility: Secondary | ICD-10-CM | POA: Diagnosis not present

## 2011-10-24 DIAGNOSIS — IMO0001 Reserved for inherently not codable concepts without codable children: Secondary | ICD-10-CM | POA: Diagnosis not present

## 2011-11-02 DIAGNOSIS — M6289 Other specified disorders of muscle: Secondary | ICD-10-CM | POA: Diagnosis not present

## 2011-11-02 DIAGNOSIS — M6281 Muscle weakness (generalized): Secondary | ICD-10-CM | POA: Diagnosis not present

## 2011-11-02 DIAGNOSIS — R262 Difficulty in walking, not elsewhere classified: Secondary | ICD-10-CM | POA: Diagnosis not present

## 2011-11-02 DIAGNOSIS — I69998 Other sequelae following unspecified cerebrovascular disease: Secondary | ICD-10-CM | POA: Diagnosis not present

## 2011-11-02 DIAGNOSIS — IMO0001 Reserved for inherently not codable concepts without codable children: Secondary | ICD-10-CM | POA: Diagnosis not present

## 2011-11-02 DIAGNOSIS — R269 Unspecified abnormalities of gait and mobility: Secondary | ICD-10-CM | POA: Diagnosis not present

## 2011-11-07 DIAGNOSIS — M6289 Other specified disorders of muscle: Secondary | ICD-10-CM | POA: Diagnosis not present

## 2011-11-07 DIAGNOSIS — R269 Unspecified abnormalities of gait and mobility: Secondary | ICD-10-CM | POA: Diagnosis not present

## 2011-11-07 DIAGNOSIS — I69998 Other sequelae following unspecified cerebrovascular disease: Secondary | ICD-10-CM | POA: Diagnosis not present

## 2011-11-07 DIAGNOSIS — M6281 Muscle weakness (generalized): Secondary | ICD-10-CM | POA: Diagnosis not present

## 2011-11-07 DIAGNOSIS — IMO0001 Reserved for inherently not codable concepts without codable children: Secondary | ICD-10-CM | POA: Diagnosis not present

## 2011-11-07 DIAGNOSIS — R262 Difficulty in walking, not elsewhere classified: Secondary | ICD-10-CM | POA: Diagnosis not present

## 2011-11-09 DIAGNOSIS — R269 Unspecified abnormalities of gait and mobility: Secondary | ICD-10-CM | POA: Diagnosis not present

## 2011-11-09 DIAGNOSIS — M6281 Muscle weakness (generalized): Secondary | ICD-10-CM | POA: Diagnosis not present

## 2011-11-09 DIAGNOSIS — IMO0001 Reserved for inherently not codable concepts without codable children: Secondary | ICD-10-CM | POA: Diagnosis not present

## 2011-11-09 DIAGNOSIS — I69998 Other sequelae following unspecified cerebrovascular disease: Secondary | ICD-10-CM | POA: Diagnosis not present

## 2011-11-09 DIAGNOSIS — R262 Difficulty in walking, not elsewhere classified: Secondary | ICD-10-CM | POA: Diagnosis not present

## 2011-11-09 DIAGNOSIS — M6289 Other specified disorders of muscle: Secondary | ICD-10-CM | POA: Diagnosis not present

## 2011-11-10 DIAGNOSIS — R269 Unspecified abnormalities of gait and mobility: Secondary | ICD-10-CM | POA: Diagnosis not present

## 2011-11-10 DIAGNOSIS — IMO0001 Reserved for inherently not codable concepts without codable children: Secondary | ICD-10-CM | POA: Diagnosis not present

## 2011-11-10 DIAGNOSIS — M6281 Muscle weakness (generalized): Secondary | ICD-10-CM | POA: Diagnosis not present

## 2011-11-10 DIAGNOSIS — R262 Difficulty in walking, not elsewhere classified: Secondary | ICD-10-CM | POA: Diagnosis not present

## 2011-11-10 DIAGNOSIS — M6289 Other specified disorders of muscle: Secondary | ICD-10-CM | POA: Diagnosis not present

## 2011-11-10 DIAGNOSIS — I69998 Other sequelae following unspecified cerebrovascular disease: Secondary | ICD-10-CM | POA: Diagnosis not present

## 2011-11-14 DIAGNOSIS — M6289 Other specified disorders of muscle: Secondary | ICD-10-CM | POA: Diagnosis not present

## 2011-11-14 DIAGNOSIS — R262 Difficulty in walking, not elsewhere classified: Secondary | ICD-10-CM | POA: Diagnosis not present

## 2011-11-14 DIAGNOSIS — I69998 Other sequelae following unspecified cerebrovascular disease: Secondary | ICD-10-CM | POA: Diagnosis not present

## 2011-11-14 DIAGNOSIS — IMO0001 Reserved for inherently not codable concepts without codable children: Secondary | ICD-10-CM | POA: Diagnosis not present

## 2011-11-14 DIAGNOSIS — R269 Unspecified abnormalities of gait and mobility: Secondary | ICD-10-CM | POA: Diagnosis not present

## 2011-11-14 DIAGNOSIS — M6281 Muscle weakness (generalized): Secondary | ICD-10-CM | POA: Diagnosis not present

## 2011-11-21 DIAGNOSIS — I69993 Ataxia following unspecified cerebrovascular disease: Secondary | ICD-10-CM | POA: Diagnosis not present

## 2011-11-21 DIAGNOSIS — IMO0001 Reserved for inherently not codable concepts without codable children: Secondary | ICD-10-CM | POA: Diagnosis not present

## 2011-11-21 DIAGNOSIS — I69998 Other sequelae following unspecified cerebrovascular disease: Secondary | ICD-10-CM | POA: Diagnosis not present

## 2011-11-21 DIAGNOSIS — R262 Difficulty in walking, not elsewhere classified: Secondary | ICD-10-CM | POA: Diagnosis not present

## 2011-11-21 DIAGNOSIS — I69959 Hemiplegia and hemiparesis following unspecified cerebrovascular disease affecting unspecified side: Secondary | ICD-10-CM | POA: Diagnosis not present

## 2011-11-21 DIAGNOSIS — M6281 Muscle weakness (generalized): Secondary | ICD-10-CM | POA: Diagnosis not present

## 2011-11-21 DIAGNOSIS — R269 Unspecified abnormalities of gait and mobility: Secondary | ICD-10-CM | POA: Diagnosis not present

## 2011-11-23 DIAGNOSIS — I69993 Ataxia following unspecified cerebrovascular disease: Secondary | ICD-10-CM | POA: Diagnosis not present

## 2011-11-23 DIAGNOSIS — R269 Unspecified abnormalities of gait and mobility: Secondary | ICD-10-CM | POA: Diagnosis not present

## 2011-11-23 DIAGNOSIS — IMO0001 Reserved for inherently not codable concepts without codable children: Secondary | ICD-10-CM | POA: Diagnosis not present

## 2011-11-23 DIAGNOSIS — R262 Difficulty in walking, not elsewhere classified: Secondary | ICD-10-CM | POA: Diagnosis not present

## 2011-11-23 DIAGNOSIS — I69959 Hemiplegia and hemiparesis following unspecified cerebrovascular disease affecting unspecified side: Secondary | ICD-10-CM | POA: Diagnosis not present

## 2011-11-23 DIAGNOSIS — I69998 Other sequelae following unspecified cerebrovascular disease: Secondary | ICD-10-CM | POA: Diagnosis not present

## 2011-11-24 DIAGNOSIS — IMO0001 Reserved for inherently not codable concepts without codable children: Secondary | ICD-10-CM | POA: Diagnosis not present

## 2011-11-24 DIAGNOSIS — I69959 Hemiplegia and hemiparesis following unspecified cerebrovascular disease affecting unspecified side: Secondary | ICD-10-CM | POA: Diagnosis not present

## 2011-11-24 DIAGNOSIS — I69993 Ataxia following unspecified cerebrovascular disease: Secondary | ICD-10-CM | POA: Diagnosis not present

## 2011-11-24 DIAGNOSIS — R269 Unspecified abnormalities of gait and mobility: Secondary | ICD-10-CM | POA: Diagnosis not present

## 2011-11-24 DIAGNOSIS — I69998 Other sequelae following unspecified cerebrovascular disease: Secondary | ICD-10-CM | POA: Diagnosis not present

## 2011-11-24 DIAGNOSIS — R262 Difficulty in walking, not elsewhere classified: Secondary | ICD-10-CM | POA: Diagnosis not present

## 2011-11-28 DIAGNOSIS — IMO0001 Reserved for inherently not codable concepts without codable children: Secondary | ICD-10-CM | POA: Diagnosis not present

## 2011-11-28 DIAGNOSIS — R269 Unspecified abnormalities of gait and mobility: Secondary | ICD-10-CM | POA: Diagnosis not present

## 2011-11-28 DIAGNOSIS — I69993 Ataxia following unspecified cerebrovascular disease: Secondary | ICD-10-CM | POA: Diagnosis not present

## 2011-11-28 DIAGNOSIS — I69998 Other sequelae following unspecified cerebrovascular disease: Secondary | ICD-10-CM | POA: Diagnosis not present

## 2011-11-28 DIAGNOSIS — I69959 Hemiplegia and hemiparesis following unspecified cerebrovascular disease affecting unspecified side: Secondary | ICD-10-CM | POA: Diagnosis not present

## 2011-11-28 DIAGNOSIS — R262 Difficulty in walking, not elsewhere classified: Secondary | ICD-10-CM | POA: Diagnosis not present

## 2011-11-30 DIAGNOSIS — I69998 Other sequelae following unspecified cerebrovascular disease: Secondary | ICD-10-CM | POA: Diagnosis not present

## 2011-11-30 DIAGNOSIS — R269 Unspecified abnormalities of gait and mobility: Secondary | ICD-10-CM | POA: Diagnosis not present

## 2011-11-30 DIAGNOSIS — R262 Difficulty in walking, not elsewhere classified: Secondary | ICD-10-CM | POA: Diagnosis not present

## 2011-11-30 DIAGNOSIS — I69993 Ataxia following unspecified cerebrovascular disease: Secondary | ICD-10-CM | POA: Diagnosis not present

## 2011-11-30 DIAGNOSIS — IMO0001 Reserved for inherently not codable concepts without codable children: Secondary | ICD-10-CM | POA: Diagnosis not present

## 2011-11-30 DIAGNOSIS — I69959 Hemiplegia and hemiparesis following unspecified cerebrovascular disease affecting unspecified side: Secondary | ICD-10-CM | POA: Diagnosis not present

## 2011-12-01 DIAGNOSIS — IMO0001 Reserved for inherently not codable concepts without codable children: Secondary | ICD-10-CM | POA: Diagnosis not present

## 2011-12-01 DIAGNOSIS — R269 Unspecified abnormalities of gait and mobility: Secondary | ICD-10-CM | POA: Diagnosis not present

## 2011-12-01 DIAGNOSIS — I69993 Ataxia following unspecified cerebrovascular disease: Secondary | ICD-10-CM | POA: Diagnosis not present

## 2011-12-01 DIAGNOSIS — I69959 Hemiplegia and hemiparesis following unspecified cerebrovascular disease affecting unspecified side: Secondary | ICD-10-CM | POA: Diagnosis not present

## 2011-12-01 DIAGNOSIS — R262 Difficulty in walking, not elsewhere classified: Secondary | ICD-10-CM | POA: Diagnosis not present

## 2011-12-01 DIAGNOSIS — I69998 Other sequelae following unspecified cerebrovascular disease: Secondary | ICD-10-CM | POA: Diagnosis not present

## 2011-12-05 DIAGNOSIS — I69993 Ataxia following unspecified cerebrovascular disease: Secondary | ICD-10-CM | POA: Diagnosis not present

## 2011-12-05 DIAGNOSIS — I69959 Hemiplegia and hemiparesis following unspecified cerebrovascular disease affecting unspecified side: Secondary | ICD-10-CM | POA: Diagnosis not present

## 2011-12-05 DIAGNOSIS — IMO0001 Reserved for inherently not codable concepts without codable children: Secondary | ICD-10-CM | POA: Diagnosis not present

## 2011-12-05 DIAGNOSIS — R262 Difficulty in walking, not elsewhere classified: Secondary | ICD-10-CM | POA: Diagnosis not present

## 2011-12-05 DIAGNOSIS — R269 Unspecified abnormalities of gait and mobility: Secondary | ICD-10-CM | POA: Diagnosis not present

## 2011-12-05 DIAGNOSIS — I69998 Other sequelae following unspecified cerebrovascular disease: Secondary | ICD-10-CM | POA: Diagnosis not present

## 2011-12-08 DIAGNOSIS — I69959 Hemiplegia and hemiparesis following unspecified cerebrovascular disease affecting unspecified side: Secondary | ICD-10-CM | POA: Diagnosis not present

## 2011-12-08 DIAGNOSIS — R269 Unspecified abnormalities of gait and mobility: Secondary | ICD-10-CM | POA: Diagnosis not present

## 2011-12-08 DIAGNOSIS — I69993 Ataxia following unspecified cerebrovascular disease: Secondary | ICD-10-CM | POA: Diagnosis not present

## 2011-12-08 DIAGNOSIS — R262 Difficulty in walking, not elsewhere classified: Secondary | ICD-10-CM | POA: Diagnosis not present

## 2011-12-08 DIAGNOSIS — IMO0001 Reserved for inherently not codable concepts without codable children: Secondary | ICD-10-CM | POA: Diagnosis not present

## 2011-12-08 DIAGNOSIS — I69998 Other sequelae following unspecified cerebrovascular disease: Secondary | ICD-10-CM | POA: Diagnosis not present

## 2011-12-12 DIAGNOSIS — I69959 Hemiplegia and hemiparesis following unspecified cerebrovascular disease affecting unspecified side: Secondary | ICD-10-CM | POA: Diagnosis not present

## 2011-12-12 DIAGNOSIS — I69998 Other sequelae following unspecified cerebrovascular disease: Secondary | ICD-10-CM | POA: Diagnosis not present

## 2011-12-12 DIAGNOSIS — IMO0001 Reserved for inherently not codable concepts without codable children: Secondary | ICD-10-CM | POA: Diagnosis not present

## 2011-12-12 DIAGNOSIS — R262 Difficulty in walking, not elsewhere classified: Secondary | ICD-10-CM | POA: Diagnosis not present

## 2011-12-12 DIAGNOSIS — I69993 Ataxia following unspecified cerebrovascular disease: Secondary | ICD-10-CM | POA: Diagnosis not present

## 2011-12-12 DIAGNOSIS — R269 Unspecified abnormalities of gait and mobility: Secondary | ICD-10-CM | POA: Diagnosis not present

## 2011-12-14 DIAGNOSIS — I69998 Other sequelae following unspecified cerebrovascular disease: Secondary | ICD-10-CM | POA: Diagnosis not present

## 2011-12-14 DIAGNOSIS — I69993 Ataxia following unspecified cerebrovascular disease: Secondary | ICD-10-CM | POA: Diagnosis not present

## 2011-12-14 DIAGNOSIS — IMO0001 Reserved for inherently not codable concepts without codable children: Secondary | ICD-10-CM | POA: Diagnosis not present

## 2011-12-14 DIAGNOSIS — I69959 Hemiplegia and hemiparesis following unspecified cerebrovascular disease affecting unspecified side: Secondary | ICD-10-CM | POA: Diagnosis not present

## 2011-12-14 DIAGNOSIS — R262 Difficulty in walking, not elsewhere classified: Secondary | ICD-10-CM | POA: Diagnosis not present

## 2011-12-14 DIAGNOSIS — R269 Unspecified abnormalities of gait and mobility: Secondary | ICD-10-CM | POA: Diagnosis not present

## 2011-12-19 DIAGNOSIS — R269 Unspecified abnormalities of gait and mobility: Secondary | ICD-10-CM | POA: Diagnosis not present

## 2011-12-19 DIAGNOSIS — I69993 Ataxia following unspecified cerebrovascular disease: Secondary | ICD-10-CM | POA: Diagnosis not present

## 2011-12-19 DIAGNOSIS — R262 Difficulty in walking, not elsewhere classified: Secondary | ICD-10-CM | POA: Diagnosis not present

## 2011-12-19 DIAGNOSIS — IMO0001 Reserved for inherently not codable concepts without codable children: Secondary | ICD-10-CM | POA: Diagnosis not present

## 2011-12-19 DIAGNOSIS — M6281 Muscle weakness (generalized): Secondary | ICD-10-CM | POA: Diagnosis not present

## 2011-12-19 DIAGNOSIS — I69998 Other sequelae following unspecified cerebrovascular disease: Secondary | ICD-10-CM | POA: Diagnosis not present

## 2011-12-19 DIAGNOSIS — I69959 Hemiplegia and hemiparesis following unspecified cerebrovascular disease affecting unspecified side: Secondary | ICD-10-CM | POA: Diagnosis not present

## 2011-12-21 DIAGNOSIS — R269 Unspecified abnormalities of gait and mobility: Secondary | ICD-10-CM | POA: Diagnosis not present

## 2011-12-21 DIAGNOSIS — I69993 Ataxia following unspecified cerebrovascular disease: Secondary | ICD-10-CM | POA: Diagnosis not present

## 2011-12-21 DIAGNOSIS — R262 Difficulty in walking, not elsewhere classified: Secondary | ICD-10-CM | POA: Diagnosis not present

## 2011-12-21 DIAGNOSIS — IMO0001 Reserved for inherently not codable concepts without codable children: Secondary | ICD-10-CM | POA: Diagnosis not present

## 2011-12-21 DIAGNOSIS — I69998 Other sequelae following unspecified cerebrovascular disease: Secondary | ICD-10-CM | POA: Diagnosis not present

## 2011-12-21 DIAGNOSIS — I69959 Hemiplegia and hemiparesis following unspecified cerebrovascular disease affecting unspecified side: Secondary | ICD-10-CM | POA: Diagnosis not present

## 2011-12-22 DIAGNOSIS — I69959 Hemiplegia and hemiparesis following unspecified cerebrovascular disease affecting unspecified side: Secondary | ICD-10-CM | POA: Diagnosis not present

## 2011-12-22 DIAGNOSIS — I69998 Other sequelae following unspecified cerebrovascular disease: Secondary | ICD-10-CM | POA: Diagnosis not present

## 2011-12-22 DIAGNOSIS — IMO0001 Reserved for inherently not codable concepts without codable children: Secondary | ICD-10-CM | POA: Diagnosis not present

## 2011-12-22 DIAGNOSIS — R262 Difficulty in walking, not elsewhere classified: Secondary | ICD-10-CM | POA: Diagnosis not present

## 2011-12-22 DIAGNOSIS — R269 Unspecified abnormalities of gait and mobility: Secondary | ICD-10-CM | POA: Diagnosis not present

## 2011-12-22 DIAGNOSIS — I69993 Ataxia following unspecified cerebrovascular disease: Secondary | ICD-10-CM | POA: Diagnosis not present

## 2011-12-26 DIAGNOSIS — I69959 Hemiplegia and hemiparesis following unspecified cerebrovascular disease affecting unspecified side: Secondary | ICD-10-CM | POA: Diagnosis not present

## 2011-12-26 DIAGNOSIS — I69993 Ataxia following unspecified cerebrovascular disease: Secondary | ICD-10-CM | POA: Diagnosis not present

## 2011-12-26 DIAGNOSIS — R269 Unspecified abnormalities of gait and mobility: Secondary | ICD-10-CM | POA: Diagnosis not present

## 2011-12-26 DIAGNOSIS — R262 Difficulty in walking, not elsewhere classified: Secondary | ICD-10-CM | POA: Diagnosis not present

## 2011-12-26 DIAGNOSIS — IMO0001 Reserved for inherently not codable concepts without codable children: Secondary | ICD-10-CM | POA: Diagnosis not present

## 2011-12-26 DIAGNOSIS — I69998 Other sequelae following unspecified cerebrovascular disease: Secondary | ICD-10-CM | POA: Diagnosis not present

## 2011-12-28 DIAGNOSIS — R269 Unspecified abnormalities of gait and mobility: Secondary | ICD-10-CM | POA: Diagnosis not present

## 2011-12-28 DIAGNOSIS — R262 Difficulty in walking, not elsewhere classified: Secondary | ICD-10-CM | POA: Diagnosis not present

## 2011-12-28 DIAGNOSIS — IMO0001 Reserved for inherently not codable concepts without codable children: Secondary | ICD-10-CM | POA: Diagnosis not present

## 2011-12-28 DIAGNOSIS — I69959 Hemiplegia and hemiparesis following unspecified cerebrovascular disease affecting unspecified side: Secondary | ICD-10-CM | POA: Diagnosis not present

## 2011-12-28 DIAGNOSIS — I69998 Other sequelae following unspecified cerebrovascular disease: Secondary | ICD-10-CM | POA: Diagnosis not present

## 2011-12-28 DIAGNOSIS — I69993 Ataxia following unspecified cerebrovascular disease: Secondary | ICD-10-CM | POA: Diagnosis not present

## 2011-12-29 DIAGNOSIS — IMO0001 Reserved for inherently not codable concepts without codable children: Secondary | ICD-10-CM | POA: Diagnosis not present

## 2011-12-29 DIAGNOSIS — I69959 Hemiplegia and hemiparesis following unspecified cerebrovascular disease affecting unspecified side: Secondary | ICD-10-CM | POA: Diagnosis not present

## 2011-12-29 DIAGNOSIS — I69993 Ataxia following unspecified cerebrovascular disease: Secondary | ICD-10-CM | POA: Diagnosis not present

## 2011-12-29 DIAGNOSIS — R269 Unspecified abnormalities of gait and mobility: Secondary | ICD-10-CM | POA: Diagnosis not present

## 2011-12-29 DIAGNOSIS — R262 Difficulty in walking, not elsewhere classified: Secondary | ICD-10-CM | POA: Diagnosis not present

## 2011-12-29 DIAGNOSIS — I69998 Other sequelae following unspecified cerebrovascular disease: Secondary | ICD-10-CM | POA: Diagnosis not present

## 2012-01-02 DIAGNOSIS — IMO0001 Reserved for inherently not codable concepts without codable children: Secondary | ICD-10-CM | POA: Diagnosis not present

## 2012-01-02 DIAGNOSIS — R262 Difficulty in walking, not elsewhere classified: Secondary | ICD-10-CM | POA: Diagnosis not present

## 2012-01-02 DIAGNOSIS — R269 Unspecified abnormalities of gait and mobility: Secondary | ICD-10-CM | POA: Diagnosis not present

## 2012-01-02 DIAGNOSIS — I69998 Other sequelae following unspecified cerebrovascular disease: Secondary | ICD-10-CM | POA: Diagnosis not present

## 2012-01-02 DIAGNOSIS — I69959 Hemiplegia and hemiparesis following unspecified cerebrovascular disease affecting unspecified side: Secondary | ICD-10-CM | POA: Diagnosis not present

## 2012-01-02 DIAGNOSIS — I69993 Ataxia following unspecified cerebrovascular disease: Secondary | ICD-10-CM | POA: Diagnosis not present

## 2012-01-04 DIAGNOSIS — R262 Difficulty in walking, not elsewhere classified: Secondary | ICD-10-CM | POA: Diagnosis not present

## 2012-01-04 DIAGNOSIS — IMO0001 Reserved for inherently not codable concepts without codable children: Secondary | ICD-10-CM | POA: Diagnosis not present

## 2012-01-04 DIAGNOSIS — I69959 Hemiplegia and hemiparesis following unspecified cerebrovascular disease affecting unspecified side: Secondary | ICD-10-CM | POA: Diagnosis not present

## 2012-01-04 DIAGNOSIS — I69993 Ataxia following unspecified cerebrovascular disease: Secondary | ICD-10-CM | POA: Diagnosis not present

## 2012-01-04 DIAGNOSIS — I69998 Other sequelae following unspecified cerebrovascular disease: Secondary | ICD-10-CM | POA: Diagnosis not present

## 2012-01-04 DIAGNOSIS — R269 Unspecified abnormalities of gait and mobility: Secondary | ICD-10-CM | POA: Diagnosis not present

## 2012-01-05 DIAGNOSIS — I69998 Other sequelae following unspecified cerebrovascular disease: Secondary | ICD-10-CM | POA: Diagnosis not present

## 2012-01-05 DIAGNOSIS — IMO0001 Reserved for inherently not codable concepts without codable children: Secondary | ICD-10-CM | POA: Diagnosis not present

## 2012-01-05 DIAGNOSIS — R269 Unspecified abnormalities of gait and mobility: Secondary | ICD-10-CM | POA: Diagnosis not present

## 2012-01-05 DIAGNOSIS — R262 Difficulty in walking, not elsewhere classified: Secondary | ICD-10-CM | POA: Diagnosis not present

## 2012-01-05 DIAGNOSIS — I69993 Ataxia following unspecified cerebrovascular disease: Secondary | ICD-10-CM | POA: Diagnosis not present

## 2012-01-05 DIAGNOSIS — I69959 Hemiplegia and hemiparesis following unspecified cerebrovascular disease affecting unspecified side: Secondary | ICD-10-CM | POA: Diagnosis not present

## 2012-01-09 DIAGNOSIS — IMO0001 Reserved for inherently not codable concepts without codable children: Secondary | ICD-10-CM | POA: Diagnosis not present

## 2012-01-09 DIAGNOSIS — R269 Unspecified abnormalities of gait and mobility: Secondary | ICD-10-CM | POA: Diagnosis not present

## 2012-01-09 DIAGNOSIS — R262 Difficulty in walking, not elsewhere classified: Secondary | ICD-10-CM | POA: Diagnosis not present

## 2012-01-09 DIAGNOSIS — I69998 Other sequelae following unspecified cerebrovascular disease: Secondary | ICD-10-CM | POA: Diagnosis not present

## 2012-01-09 DIAGNOSIS — I69959 Hemiplegia and hemiparesis following unspecified cerebrovascular disease affecting unspecified side: Secondary | ICD-10-CM | POA: Diagnosis not present

## 2012-01-09 DIAGNOSIS — I69993 Ataxia following unspecified cerebrovascular disease: Secondary | ICD-10-CM | POA: Diagnosis not present

## 2012-01-11 DIAGNOSIS — R262 Difficulty in walking, not elsewhere classified: Secondary | ICD-10-CM | POA: Diagnosis not present

## 2012-01-11 DIAGNOSIS — I69993 Ataxia following unspecified cerebrovascular disease: Secondary | ICD-10-CM | POA: Diagnosis not present

## 2012-01-11 DIAGNOSIS — R269 Unspecified abnormalities of gait and mobility: Secondary | ICD-10-CM | POA: Diagnosis not present

## 2012-01-11 DIAGNOSIS — IMO0001 Reserved for inherently not codable concepts without codable children: Secondary | ICD-10-CM | POA: Diagnosis not present

## 2012-01-11 DIAGNOSIS — I69959 Hemiplegia and hemiparesis following unspecified cerebrovascular disease affecting unspecified side: Secondary | ICD-10-CM | POA: Diagnosis not present

## 2012-01-11 DIAGNOSIS — I69998 Other sequelae following unspecified cerebrovascular disease: Secondary | ICD-10-CM | POA: Diagnosis not present

## 2012-01-18 DIAGNOSIS — I69998 Other sequelae following unspecified cerebrovascular disease: Secondary | ICD-10-CM | POA: Diagnosis not present

## 2012-01-18 DIAGNOSIS — R269 Unspecified abnormalities of gait and mobility: Secondary | ICD-10-CM | POA: Diagnosis not present

## 2012-01-18 DIAGNOSIS — IMO0001 Reserved for inherently not codable concepts without codable children: Secondary | ICD-10-CM | POA: Diagnosis not present

## 2012-01-18 DIAGNOSIS — I69993 Ataxia following unspecified cerebrovascular disease: Secondary | ICD-10-CM | POA: Diagnosis not present

## 2012-01-18 DIAGNOSIS — R262 Difficulty in walking, not elsewhere classified: Secondary | ICD-10-CM | POA: Diagnosis not present

## 2012-01-18 DIAGNOSIS — I69959 Hemiplegia and hemiparesis following unspecified cerebrovascular disease affecting unspecified side: Secondary | ICD-10-CM | POA: Diagnosis not present

## 2012-01-22 DIAGNOSIS — I69993 Ataxia following unspecified cerebrovascular disease: Secondary | ICD-10-CM | POA: Diagnosis not present

## 2012-01-22 DIAGNOSIS — IMO0001 Reserved for inherently not codable concepts without codable children: Secondary | ICD-10-CM | POA: Diagnosis not present

## 2012-01-22 DIAGNOSIS — R269 Unspecified abnormalities of gait and mobility: Secondary | ICD-10-CM | POA: Diagnosis not present

## 2012-01-22 DIAGNOSIS — I69998 Other sequelae following unspecified cerebrovascular disease: Secondary | ICD-10-CM | POA: Diagnosis not present

## 2012-01-22 DIAGNOSIS — M6281 Muscle weakness (generalized): Secondary | ICD-10-CM | POA: Diagnosis not present

## 2012-01-22 DIAGNOSIS — I69959 Hemiplegia and hemiparesis following unspecified cerebrovascular disease affecting unspecified side: Secondary | ICD-10-CM | POA: Diagnosis not present

## 2012-01-25 DIAGNOSIS — IMO0001 Reserved for inherently not codable concepts without codable children: Secondary | ICD-10-CM | POA: Diagnosis not present

## 2012-01-25 DIAGNOSIS — I69998 Other sequelae following unspecified cerebrovascular disease: Secondary | ICD-10-CM | POA: Diagnosis not present

## 2012-01-25 DIAGNOSIS — R269 Unspecified abnormalities of gait and mobility: Secondary | ICD-10-CM | POA: Diagnosis not present

## 2012-01-25 DIAGNOSIS — I69993 Ataxia following unspecified cerebrovascular disease: Secondary | ICD-10-CM | POA: Diagnosis not present

## 2012-01-25 DIAGNOSIS — M6281 Muscle weakness (generalized): Secondary | ICD-10-CM | POA: Diagnosis not present

## 2012-01-29 DIAGNOSIS — I69998 Other sequelae following unspecified cerebrovascular disease: Secondary | ICD-10-CM | POA: Diagnosis not present

## 2012-01-29 DIAGNOSIS — I69993 Ataxia following unspecified cerebrovascular disease: Secondary | ICD-10-CM | POA: Diagnosis not present

## 2012-01-29 DIAGNOSIS — IMO0001 Reserved for inherently not codable concepts without codable children: Secondary | ICD-10-CM | POA: Diagnosis not present

## 2012-01-29 DIAGNOSIS — R269 Unspecified abnormalities of gait and mobility: Secondary | ICD-10-CM | POA: Diagnosis not present

## 2012-01-29 DIAGNOSIS — M6281 Muscle weakness (generalized): Secondary | ICD-10-CM | POA: Diagnosis not present

## 2012-02-01 DIAGNOSIS — I69998 Other sequelae following unspecified cerebrovascular disease: Secondary | ICD-10-CM | POA: Diagnosis not present

## 2012-02-01 DIAGNOSIS — IMO0001 Reserved for inherently not codable concepts without codable children: Secondary | ICD-10-CM | POA: Diagnosis not present

## 2012-02-01 DIAGNOSIS — M6281 Muscle weakness (generalized): Secondary | ICD-10-CM | POA: Diagnosis not present

## 2012-02-01 DIAGNOSIS — R269 Unspecified abnormalities of gait and mobility: Secondary | ICD-10-CM | POA: Diagnosis not present

## 2012-02-01 DIAGNOSIS — I69993 Ataxia following unspecified cerebrovascular disease: Secondary | ICD-10-CM | POA: Diagnosis not present

## 2012-02-08 DIAGNOSIS — I69998 Other sequelae following unspecified cerebrovascular disease: Secondary | ICD-10-CM | POA: Diagnosis not present

## 2012-02-08 DIAGNOSIS — I69993 Ataxia following unspecified cerebrovascular disease: Secondary | ICD-10-CM | POA: Diagnosis not present

## 2012-02-08 DIAGNOSIS — M6281 Muscle weakness (generalized): Secondary | ICD-10-CM | POA: Diagnosis not present

## 2012-02-08 DIAGNOSIS — IMO0001 Reserved for inherently not codable concepts without codable children: Secondary | ICD-10-CM | POA: Diagnosis not present

## 2012-02-08 DIAGNOSIS — R269 Unspecified abnormalities of gait and mobility: Secondary | ICD-10-CM | POA: Diagnosis not present

## 2012-02-19 DIAGNOSIS — R269 Unspecified abnormalities of gait and mobility: Secondary | ICD-10-CM | POA: Diagnosis not present

## 2012-02-19 DIAGNOSIS — I69998 Other sequelae following unspecified cerebrovascular disease: Secondary | ICD-10-CM | POA: Diagnosis not present

## 2012-02-19 DIAGNOSIS — IMO0001 Reserved for inherently not codable concepts without codable children: Secondary | ICD-10-CM | POA: Diagnosis not present

## 2012-02-19 DIAGNOSIS — R262 Difficulty in walking, not elsewhere classified: Secondary | ICD-10-CM | POA: Diagnosis not present

## 2012-02-19 DIAGNOSIS — M6281 Muscle weakness (generalized): Secondary | ICD-10-CM | POA: Diagnosis not present

## 2012-02-19 DIAGNOSIS — I69959 Hemiplegia and hemiparesis following unspecified cerebrovascular disease affecting unspecified side: Secondary | ICD-10-CM | POA: Diagnosis not present

## 2012-02-21 DIAGNOSIS — Z1231 Encounter for screening mammogram for malignant neoplasm of breast: Secondary | ICD-10-CM | POA: Diagnosis not present

## 2012-02-22 DIAGNOSIS — R0789 Other chest pain: Secondary | ICD-10-CM | POA: Diagnosis not present

## 2012-02-22 DIAGNOSIS — Z87891 Personal history of nicotine dependence: Secondary | ICD-10-CM | POA: Diagnosis not present

## 2012-02-22 DIAGNOSIS — J449 Chronic obstructive pulmonary disease, unspecified: Secondary | ICD-10-CM | POA: Diagnosis not present

## 2012-02-22 DIAGNOSIS — I69959 Hemiplegia and hemiparesis following unspecified cerebrovascular disease affecting unspecified side: Secondary | ICD-10-CM | POA: Diagnosis not present

## 2012-02-22 DIAGNOSIS — R0602 Shortness of breath: Secondary | ICD-10-CM | POA: Diagnosis not present

## 2012-02-22 DIAGNOSIS — I2510 Atherosclerotic heart disease of native coronary artery without angina pectoris: Secondary | ICD-10-CM | POA: Diagnosis not present

## 2012-02-22 DIAGNOSIS — M1990 Unspecified osteoarthritis, unspecified site: Secondary | ICD-10-CM | POA: Diagnosis not present

## 2012-02-22 DIAGNOSIS — E785 Hyperlipidemia, unspecified: Secondary | ICD-10-CM | POA: Diagnosis not present

## 2012-02-22 DIAGNOSIS — I10 Essential (primary) hypertension: Secondary | ICD-10-CM | POA: Diagnosis not present

## 2012-02-22 DIAGNOSIS — I209 Angina pectoris, unspecified: Secondary | ICD-10-CM | POA: Diagnosis not present

## 2012-02-22 DIAGNOSIS — Z853 Personal history of malignant neoplasm of breast: Secondary | ICD-10-CM | POA: Diagnosis not present

## 2012-02-22 DIAGNOSIS — E119 Type 2 diabetes mellitus without complications: Secondary | ICD-10-CM | POA: Diagnosis not present

## 2012-02-22 DIAGNOSIS — Z9010 Acquired absence of unspecified breast and nipple: Secondary | ICD-10-CM | POA: Diagnosis not present

## 2012-02-28 DIAGNOSIS — M6281 Muscle weakness (generalized): Secondary | ICD-10-CM | POA: Diagnosis not present

## 2012-02-28 DIAGNOSIS — R269 Unspecified abnormalities of gait and mobility: Secondary | ICD-10-CM | POA: Diagnosis not present

## 2012-02-28 DIAGNOSIS — I69998 Other sequelae following unspecified cerebrovascular disease: Secondary | ICD-10-CM | POA: Diagnosis not present

## 2012-02-28 DIAGNOSIS — IMO0001 Reserved for inherently not codable concepts without codable children: Secondary | ICD-10-CM | POA: Diagnosis not present

## 2012-02-28 DIAGNOSIS — R262 Difficulty in walking, not elsewhere classified: Secondary | ICD-10-CM | POA: Diagnosis not present

## 2012-02-28 DIAGNOSIS — I69959 Hemiplegia and hemiparesis following unspecified cerebrovascular disease affecting unspecified side: Secondary | ICD-10-CM | POA: Diagnosis not present

## 2012-03-04 DIAGNOSIS — R269 Unspecified abnormalities of gait and mobility: Secondary | ICD-10-CM | POA: Diagnosis not present

## 2012-03-04 DIAGNOSIS — IMO0001 Reserved for inherently not codable concepts without codable children: Secondary | ICD-10-CM | POA: Diagnosis not present

## 2012-03-04 DIAGNOSIS — I69998 Other sequelae following unspecified cerebrovascular disease: Secondary | ICD-10-CM | POA: Diagnosis not present

## 2012-03-04 DIAGNOSIS — M6281 Muscle weakness (generalized): Secondary | ICD-10-CM | POA: Diagnosis not present

## 2012-03-04 DIAGNOSIS — R262 Difficulty in walking, not elsewhere classified: Secondary | ICD-10-CM | POA: Diagnosis not present

## 2012-03-04 DIAGNOSIS — I69959 Hemiplegia and hemiparesis following unspecified cerebrovascular disease affecting unspecified side: Secondary | ICD-10-CM | POA: Diagnosis not present

## 2012-03-07 DIAGNOSIS — R269 Unspecified abnormalities of gait and mobility: Secondary | ICD-10-CM | POA: Diagnosis not present

## 2012-03-07 DIAGNOSIS — I69998 Other sequelae following unspecified cerebrovascular disease: Secondary | ICD-10-CM | POA: Diagnosis not present

## 2012-03-07 DIAGNOSIS — I69959 Hemiplegia and hemiparesis following unspecified cerebrovascular disease affecting unspecified side: Secondary | ICD-10-CM | POA: Diagnosis not present

## 2012-03-07 DIAGNOSIS — M6281 Muscle weakness (generalized): Secondary | ICD-10-CM | POA: Diagnosis not present

## 2012-03-07 DIAGNOSIS — R262 Difficulty in walking, not elsewhere classified: Secondary | ICD-10-CM | POA: Diagnosis not present

## 2012-03-07 DIAGNOSIS — IMO0001 Reserved for inherently not codable concepts without codable children: Secondary | ICD-10-CM | POA: Diagnosis not present

## 2012-03-18 DIAGNOSIS — M6281 Muscle weakness (generalized): Secondary | ICD-10-CM | POA: Diagnosis not present

## 2012-03-18 DIAGNOSIS — IMO0001 Reserved for inherently not codable concepts without codable children: Secondary | ICD-10-CM | POA: Diagnosis not present

## 2012-03-18 DIAGNOSIS — I69959 Hemiplegia and hemiparesis following unspecified cerebrovascular disease affecting unspecified side: Secondary | ICD-10-CM | POA: Diagnosis not present

## 2012-03-18 DIAGNOSIS — I69998 Other sequelae following unspecified cerebrovascular disease: Secondary | ICD-10-CM | POA: Diagnosis not present

## 2012-03-18 DIAGNOSIS — R269 Unspecified abnormalities of gait and mobility: Secondary | ICD-10-CM | POA: Diagnosis not present

## 2012-03-18 DIAGNOSIS — R262 Difficulty in walking, not elsewhere classified: Secondary | ICD-10-CM | POA: Diagnosis not present

## 2012-03-21 DIAGNOSIS — I69998 Other sequelae following unspecified cerebrovascular disease: Secondary | ICD-10-CM | POA: Diagnosis not present

## 2012-03-21 DIAGNOSIS — R262 Difficulty in walking, not elsewhere classified: Secondary | ICD-10-CM | POA: Diagnosis not present

## 2012-03-21 DIAGNOSIS — I69993 Ataxia following unspecified cerebrovascular disease: Secondary | ICD-10-CM | POA: Diagnosis not present

## 2012-03-21 DIAGNOSIS — R269 Unspecified abnormalities of gait and mobility: Secondary | ICD-10-CM | POA: Diagnosis not present

## 2012-03-21 DIAGNOSIS — M6281 Muscle weakness (generalized): Secondary | ICD-10-CM | POA: Diagnosis not present

## 2012-03-21 DIAGNOSIS — IMO0001 Reserved for inherently not codable concepts without codable children: Secondary | ICD-10-CM | POA: Diagnosis not present

## 2012-03-27 DIAGNOSIS — M6281 Muscle weakness (generalized): Secondary | ICD-10-CM | POA: Diagnosis not present

## 2012-03-27 DIAGNOSIS — R262 Difficulty in walking, not elsewhere classified: Secondary | ICD-10-CM | POA: Diagnosis not present

## 2012-03-27 DIAGNOSIS — IMO0001 Reserved for inherently not codable concepts without codable children: Secondary | ICD-10-CM | POA: Diagnosis not present

## 2012-03-27 DIAGNOSIS — R269 Unspecified abnormalities of gait and mobility: Secondary | ICD-10-CM | POA: Diagnosis not present

## 2012-03-27 DIAGNOSIS — I69993 Ataxia following unspecified cerebrovascular disease: Secondary | ICD-10-CM | POA: Diagnosis not present

## 2012-03-27 DIAGNOSIS — I69998 Other sequelae following unspecified cerebrovascular disease: Secondary | ICD-10-CM | POA: Diagnosis not present

## 2012-04-02 DIAGNOSIS — I69998 Other sequelae following unspecified cerebrovascular disease: Secondary | ICD-10-CM | POA: Diagnosis not present

## 2012-04-02 DIAGNOSIS — I69993 Ataxia following unspecified cerebrovascular disease: Secondary | ICD-10-CM | POA: Diagnosis not present

## 2012-04-02 DIAGNOSIS — R269 Unspecified abnormalities of gait and mobility: Secondary | ICD-10-CM | POA: Diagnosis not present

## 2012-04-02 DIAGNOSIS — R262 Difficulty in walking, not elsewhere classified: Secondary | ICD-10-CM | POA: Diagnosis not present

## 2012-04-02 DIAGNOSIS — IMO0001 Reserved for inherently not codable concepts without codable children: Secondary | ICD-10-CM | POA: Diagnosis not present

## 2012-04-02 DIAGNOSIS — M6281 Muscle weakness (generalized): Secondary | ICD-10-CM | POA: Diagnosis not present

## 2012-04-16 DIAGNOSIS — M6281 Muscle weakness (generalized): Secondary | ICD-10-CM | POA: Diagnosis not present

## 2012-04-16 DIAGNOSIS — R269 Unspecified abnormalities of gait and mobility: Secondary | ICD-10-CM | POA: Diagnosis not present

## 2012-04-16 DIAGNOSIS — R262 Difficulty in walking, not elsewhere classified: Secondary | ICD-10-CM | POA: Diagnosis not present

## 2012-04-16 DIAGNOSIS — I69993 Ataxia following unspecified cerebrovascular disease: Secondary | ICD-10-CM | POA: Diagnosis not present

## 2012-04-16 DIAGNOSIS — IMO0001 Reserved for inherently not codable concepts without codable children: Secondary | ICD-10-CM | POA: Diagnosis not present

## 2012-04-16 DIAGNOSIS — I69998 Other sequelae following unspecified cerebrovascular disease: Secondary | ICD-10-CM | POA: Diagnosis not present

## 2012-04-19 DIAGNOSIS — M79609 Pain in unspecified limb: Secondary | ICD-10-CM | POA: Diagnosis not present

## 2012-04-19 DIAGNOSIS — M25579 Pain in unspecified ankle and joints of unspecified foot: Secondary | ICD-10-CM | POA: Diagnosis not present

## 2012-04-23 DIAGNOSIS — I69959 Hemiplegia and hemiparesis following unspecified cerebrovascular disease affecting unspecified side: Secondary | ICD-10-CM | POA: Diagnosis not present

## 2012-04-23 DIAGNOSIS — R269 Unspecified abnormalities of gait and mobility: Secondary | ICD-10-CM | POA: Diagnosis not present

## 2012-04-23 DIAGNOSIS — M6281 Muscle weakness (generalized): Secondary | ICD-10-CM | POA: Diagnosis not present

## 2012-04-23 DIAGNOSIS — R262 Difficulty in walking, not elsewhere classified: Secondary | ICD-10-CM | POA: Diagnosis not present

## 2012-04-23 DIAGNOSIS — I69998 Other sequelae following unspecified cerebrovascular disease: Secondary | ICD-10-CM | POA: Diagnosis not present

## 2012-04-23 DIAGNOSIS — R279 Unspecified lack of coordination: Secondary | ICD-10-CM | POA: Diagnosis not present

## 2012-04-23 DIAGNOSIS — IMO0001 Reserved for inherently not codable concepts without codable children: Secondary | ICD-10-CM | POA: Diagnosis not present

## 2012-04-24 DIAGNOSIS — R269 Unspecified abnormalities of gait and mobility: Secondary | ICD-10-CM | POA: Diagnosis not present

## 2012-04-24 DIAGNOSIS — R279 Unspecified lack of coordination: Secondary | ICD-10-CM | POA: Diagnosis not present

## 2012-04-24 DIAGNOSIS — IMO0001 Reserved for inherently not codable concepts without codable children: Secondary | ICD-10-CM | POA: Diagnosis not present

## 2012-04-24 DIAGNOSIS — R262 Difficulty in walking, not elsewhere classified: Secondary | ICD-10-CM | POA: Diagnosis not present

## 2012-04-24 DIAGNOSIS — I69998 Other sequelae following unspecified cerebrovascular disease: Secondary | ICD-10-CM | POA: Diagnosis not present

## 2012-04-24 DIAGNOSIS — M6281 Muscle weakness (generalized): Secondary | ICD-10-CM | POA: Diagnosis not present

## 2012-05-01 DIAGNOSIS — R269 Unspecified abnormalities of gait and mobility: Secondary | ICD-10-CM | POA: Diagnosis not present

## 2012-05-01 DIAGNOSIS — R262 Difficulty in walking, not elsewhere classified: Secondary | ICD-10-CM | POA: Diagnosis not present

## 2012-05-01 DIAGNOSIS — R279 Unspecified lack of coordination: Secondary | ICD-10-CM | POA: Diagnosis not present

## 2012-05-01 DIAGNOSIS — I69998 Other sequelae following unspecified cerebrovascular disease: Secondary | ICD-10-CM | POA: Diagnosis not present

## 2012-05-01 DIAGNOSIS — IMO0001 Reserved for inherently not codable concepts without codable children: Secondary | ICD-10-CM | POA: Diagnosis not present

## 2012-05-01 DIAGNOSIS — M6281 Muscle weakness (generalized): Secondary | ICD-10-CM | POA: Diagnosis not present

## 2012-05-07 DIAGNOSIS — I69998 Other sequelae following unspecified cerebrovascular disease: Secondary | ICD-10-CM | POA: Diagnosis not present

## 2012-05-07 DIAGNOSIS — R269 Unspecified abnormalities of gait and mobility: Secondary | ICD-10-CM | POA: Diagnosis not present

## 2012-05-07 DIAGNOSIS — R262 Difficulty in walking, not elsewhere classified: Secondary | ICD-10-CM | POA: Diagnosis not present

## 2012-05-07 DIAGNOSIS — R279 Unspecified lack of coordination: Secondary | ICD-10-CM | POA: Diagnosis not present

## 2012-05-07 DIAGNOSIS — M6281 Muscle weakness (generalized): Secondary | ICD-10-CM | POA: Diagnosis not present

## 2012-05-07 DIAGNOSIS — IMO0001 Reserved for inherently not codable concepts without codable children: Secondary | ICD-10-CM | POA: Diagnosis not present

## 2012-05-08 DIAGNOSIS — M6281 Muscle weakness (generalized): Secondary | ICD-10-CM | POA: Diagnosis not present

## 2012-05-08 DIAGNOSIS — IMO0001 Reserved for inherently not codable concepts without codable children: Secondary | ICD-10-CM | POA: Diagnosis not present

## 2012-05-08 DIAGNOSIS — R279 Unspecified lack of coordination: Secondary | ICD-10-CM | POA: Diagnosis not present

## 2012-05-08 DIAGNOSIS — I69998 Other sequelae following unspecified cerebrovascular disease: Secondary | ICD-10-CM | POA: Diagnosis not present

## 2012-05-08 DIAGNOSIS — R262 Difficulty in walking, not elsewhere classified: Secondary | ICD-10-CM | POA: Diagnosis not present

## 2012-05-21 DIAGNOSIS — I69998 Other sequelae following unspecified cerebrovascular disease: Secondary | ICD-10-CM | POA: Diagnosis not present

## 2012-05-21 DIAGNOSIS — R279 Unspecified lack of coordination: Secondary | ICD-10-CM | POA: Diagnosis not present

## 2012-05-21 DIAGNOSIS — I69959 Hemiplegia and hemiparesis following unspecified cerebrovascular disease affecting unspecified side: Secondary | ICD-10-CM | POA: Diagnosis not present

## 2012-05-21 DIAGNOSIS — R269 Unspecified abnormalities of gait and mobility: Secondary | ICD-10-CM | POA: Diagnosis not present

## 2012-05-21 DIAGNOSIS — R262 Difficulty in walking, not elsewhere classified: Secondary | ICD-10-CM | POA: Diagnosis not present

## 2012-05-21 DIAGNOSIS — M6281 Muscle weakness (generalized): Secondary | ICD-10-CM | POA: Diagnosis not present

## 2012-05-21 DIAGNOSIS — IMO0001 Reserved for inherently not codable concepts without codable children: Secondary | ICD-10-CM | POA: Diagnosis not present

## 2012-05-22 DIAGNOSIS — M6281 Muscle weakness (generalized): Secondary | ICD-10-CM | POA: Diagnosis not present

## 2012-05-22 DIAGNOSIS — R279 Unspecified lack of coordination: Secondary | ICD-10-CM | POA: Diagnosis not present

## 2012-05-22 DIAGNOSIS — IMO0001 Reserved for inherently not codable concepts without codable children: Secondary | ICD-10-CM | POA: Diagnosis not present

## 2012-05-22 DIAGNOSIS — I69998 Other sequelae following unspecified cerebrovascular disease: Secondary | ICD-10-CM | POA: Diagnosis not present

## 2012-05-22 DIAGNOSIS — I69959 Hemiplegia and hemiparesis following unspecified cerebrovascular disease affecting unspecified side: Secondary | ICD-10-CM | POA: Diagnosis not present

## 2012-05-28 DIAGNOSIS — M6281 Muscle weakness (generalized): Secondary | ICD-10-CM | POA: Diagnosis not present

## 2012-05-28 DIAGNOSIS — I69998 Other sequelae following unspecified cerebrovascular disease: Secondary | ICD-10-CM | POA: Diagnosis not present

## 2012-05-29 DIAGNOSIS — R269 Unspecified abnormalities of gait and mobility: Secondary | ICD-10-CM | POA: Diagnosis not present

## 2012-05-29 DIAGNOSIS — I69959 Hemiplegia and hemiparesis following unspecified cerebrovascular disease affecting unspecified side: Secondary | ICD-10-CM | POA: Diagnosis not present

## 2012-05-29 DIAGNOSIS — I69998 Other sequelae following unspecified cerebrovascular disease: Secondary | ICD-10-CM | POA: Diagnosis not present

## 2012-05-29 DIAGNOSIS — M6281 Muscle weakness (generalized): Secondary | ICD-10-CM | POA: Diagnosis not present

## 2012-05-29 DIAGNOSIS — R279 Unspecified lack of coordination: Secondary | ICD-10-CM | POA: Diagnosis not present

## 2012-06-04 DIAGNOSIS — M6281 Muscle weakness (generalized): Secondary | ICD-10-CM | POA: Diagnosis not present

## 2012-06-04 DIAGNOSIS — R279 Unspecified lack of coordination: Secondary | ICD-10-CM | POA: Diagnosis not present

## 2012-06-06 DIAGNOSIS — I69998 Other sequelae following unspecified cerebrovascular disease: Secondary | ICD-10-CM | POA: Diagnosis not present

## 2012-06-06 DIAGNOSIS — M6281 Muscle weakness (generalized): Secondary | ICD-10-CM | POA: Diagnosis not present

## 2012-06-06 DIAGNOSIS — IMO0001 Reserved for inherently not codable concepts without codable children: Secondary | ICD-10-CM | POA: Diagnosis not present

## 2012-06-06 DIAGNOSIS — I69959 Hemiplegia and hemiparesis following unspecified cerebrovascular disease affecting unspecified side: Secondary | ICD-10-CM | POA: Diagnosis not present

## 2012-06-06 DIAGNOSIS — R269 Unspecified abnormalities of gait and mobility: Secondary | ICD-10-CM | POA: Diagnosis not present

## 2012-06-11 DIAGNOSIS — I69959 Hemiplegia and hemiparesis following unspecified cerebrovascular disease affecting unspecified side: Secondary | ICD-10-CM | POA: Diagnosis not present

## 2012-06-11 DIAGNOSIS — R279 Unspecified lack of coordination: Secondary | ICD-10-CM | POA: Diagnosis not present

## 2012-06-11 DIAGNOSIS — IMO0001 Reserved for inherently not codable concepts without codable children: Secondary | ICD-10-CM | POA: Diagnosis not present

## 2012-06-11 DIAGNOSIS — R269 Unspecified abnormalities of gait and mobility: Secondary | ICD-10-CM | POA: Diagnosis not present

## 2012-06-11 DIAGNOSIS — I69998 Other sequelae following unspecified cerebrovascular disease: Secondary | ICD-10-CM | POA: Diagnosis not present

## 2012-06-11 DIAGNOSIS — M6281 Muscle weakness (generalized): Secondary | ICD-10-CM | POA: Diagnosis not present

## 2012-06-18 DIAGNOSIS — I69998 Other sequelae following unspecified cerebrovascular disease: Secondary | ICD-10-CM | POA: Diagnosis not present

## 2012-06-18 DIAGNOSIS — M6289 Other specified disorders of muscle: Secondary | ICD-10-CM | POA: Diagnosis not present

## 2012-06-18 DIAGNOSIS — IMO0001 Reserved for inherently not codable concepts without codable children: Secondary | ICD-10-CM | POA: Diagnosis not present

## 2012-06-18 DIAGNOSIS — M6281 Muscle weakness (generalized): Secondary | ICD-10-CM | POA: Diagnosis not present

## 2012-06-18 DIAGNOSIS — R269 Unspecified abnormalities of gait and mobility: Secondary | ICD-10-CM | POA: Diagnosis not present

## 2012-06-19 DIAGNOSIS — M6289 Other specified disorders of muscle: Secondary | ICD-10-CM | POA: Diagnosis not present

## 2012-06-19 DIAGNOSIS — M6281 Muscle weakness (generalized): Secondary | ICD-10-CM | POA: Diagnosis not present

## 2012-06-19 DIAGNOSIS — I69998 Other sequelae following unspecified cerebrovascular disease: Secondary | ICD-10-CM | POA: Diagnosis not present

## 2012-06-19 DIAGNOSIS — IMO0001 Reserved for inherently not codable concepts without codable children: Secondary | ICD-10-CM | POA: Diagnosis not present

## 2012-06-19 DIAGNOSIS — R269 Unspecified abnormalities of gait and mobility: Secondary | ICD-10-CM | POA: Diagnosis not present

## 2012-06-25 DIAGNOSIS — I10 Essential (primary) hypertension: Secondary | ICD-10-CM | POA: Diagnosis not present

## 2012-06-25 DIAGNOSIS — I69998 Other sequelae following unspecified cerebrovascular disease: Secondary | ICD-10-CM | POA: Diagnosis not present

## 2012-06-25 DIAGNOSIS — R269 Unspecified abnormalities of gait and mobility: Secondary | ICD-10-CM | POA: Diagnosis not present

## 2012-06-25 DIAGNOSIS — IMO0001 Reserved for inherently not codable concepts without codable children: Secondary | ICD-10-CM | POA: Diagnosis not present

## 2012-06-25 DIAGNOSIS — M6289 Other specified disorders of muscle: Secondary | ICD-10-CM | POA: Diagnosis not present

## 2012-06-25 DIAGNOSIS — R072 Precordial pain: Secondary | ICD-10-CM | POA: Diagnosis not present

## 2012-06-25 DIAGNOSIS — E785 Hyperlipidemia, unspecified: Secondary | ICD-10-CM | POA: Diagnosis not present

## 2012-06-25 DIAGNOSIS — M6281 Muscle weakness (generalized): Secondary | ICD-10-CM | POA: Diagnosis not present

## 2012-06-25 DIAGNOSIS — I2510 Atherosclerotic heart disease of native coronary artery without angina pectoris: Secondary | ICD-10-CM | POA: Diagnosis not present

## 2012-06-26 DIAGNOSIS — IMO0001 Reserved for inherently not codable concepts without codable children: Secondary | ICD-10-CM | POA: Diagnosis not present

## 2012-06-26 DIAGNOSIS — I69998 Other sequelae following unspecified cerebrovascular disease: Secondary | ICD-10-CM | POA: Diagnosis not present

## 2012-06-26 DIAGNOSIS — R269 Unspecified abnormalities of gait and mobility: Secondary | ICD-10-CM | POA: Diagnosis not present

## 2012-06-26 DIAGNOSIS — M6289 Other specified disorders of muscle: Secondary | ICD-10-CM | POA: Diagnosis not present

## 2012-06-26 DIAGNOSIS — M6281 Muscle weakness (generalized): Secondary | ICD-10-CM | POA: Diagnosis not present

## 2012-07-02 DIAGNOSIS — I69998 Other sequelae following unspecified cerebrovascular disease: Secondary | ICD-10-CM | POA: Diagnosis not present

## 2012-07-02 DIAGNOSIS — M6289 Other specified disorders of muscle: Secondary | ICD-10-CM | POA: Diagnosis not present

## 2012-07-02 DIAGNOSIS — IMO0001 Reserved for inherently not codable concepts without codable children: Secondary | ICD-10-CM | POA: Diagnosis not present

## 2012-07-02 DIAGNOSIS — M6281 Muscle weakness (generalized): Secondary | ICD-10-CM | POA: Diagnosis not present

## 2012-07-02 DIAGNOSIS — R269 Unspecified abnormalities of gait and mobility: Secondary | ICD-10-CM | POA: Diagnosis not present

## 2012-09-27 DIAGNOSIS — J449 Chronic obstructive pulmonary disease, unspecified: Secondary | ICD-10-CM | POA: Diagnosis not present

## 2012-09-27 DIAGNOSIS — R0789 Other chest pain: Secondary | ICD-10-CM | POA: Diagnosis not present

## 2012-09-27 DIAGNOSIS — I6789 Other cerebrovascular disease: Secondary | ICD-10-CM | POA: Diagnosis not present

## 2012-09-30 DIAGNOSIS — R7989 Other specified abnormal findings of blood chemistry: Secondary | ICD-10-CM | POA: Diagnosis not present

## 2012-09-30 DIAGNOSIS — E119 Type 2 diabetes mellitus without complications: Secondary | ICD-10-CM | POA: Diagnosis not present

## 2012-09-30 DIAGNOSIS — E559 Vitamin D deficiency, unspecified: Secondary | ICD-10-CM | POA: Diagnosis not present

## 2012-09-30 DIAGNOSIS — D518 Other vitamin B12 deficiency anemias: Secondary | ICD-10-CM | POA: Diagnosis not present

## 2012-09-30 DIAGNOSIS — E785 Hyperlipidemia, unspecified: Secondary | ICD-10-CM | POA: Diagnosis not present

## 2012-10-11 DIAGNOSIS — D649 Anemia, unspecified: Secondary | ICD-10-CM | POA: Diagnosis not present

## 2012-11-25 DIAGNOSIS — H2510 Age-related nuclear cataract, unspecified eye: Secondary | ICD-10-CM | POA: Diagnosis not present

## 2012-11-25 DIAGNOSIS — H04129 Dry eye syndrome of unspecified lacrimal gland: Secondary | ICD-10-CM | POA: Diagnosis not present

## 2012-11-28 DIAGNOSIS — I252 Old myocardial infarction: Secondary | ICD-10-CM | POA: Diagnosis not present

## 2012-11-28 DIAGNOSIS — I10 Essential (primary) hypertension: Secondary | ICD-10-CM | POA: Diagnosis not present

## 2012-11-28 DIAGNOSIS — E785 Hyperlipidemia, unspecified: Secondary | ICD-10-CM | POA: Diagnosis not present

## 2012-11-28 DIAGNOSIS — I2510 Atherosclerotic heart disease of native coronary artery without angina pectoris: Secondary | ICD-10-CM | POA: Diagnosis not present

## 2012-12-09 DIAGNOSIS — I248 Other forms of acute ischemic heart disease: Secondary | ICD-10-CM | POA: Diagnosis not present

## 2012-12-09 DIAGNOSIS — H269 Unspecified cataract: Secondary | ICD-10-CM | POA: Diagnosis not present

## 2012-12-23 DIAGNOSIS — H2510 Age-related nuclear cataract, unspecified eye: Secondary | ICD-10-CM | POA: Diagnosis not present

## 2012-12-30 DIAGNOSIS — H269 Unspecified cataract: Secondary | ICD-10-CM | POA: Diagnosis not present

## 2012-12-30 DIAGNOSIS — H2510 Age-related nuclear cataract, unspecified eye: Secondary | ICD-10-CM | POA: Diagnosis not present

## 2012-12-30 DIAGNOSIS — I10 Essential (primary) hypertension: Secondary | ICD-10-CM | POA: Diagnosis not present

## 2012-12-30 DIAGNOSIS — I219 Acute myocardial infarction, unspecified: Secondary | ICD-10-CM | POA: Diagnosis not present

## 2012-12-30 DIAGNOSIS — F458 Other somatoform disorders: Secondary | ICD-10-CM | POA: Diagnosis not present

## 2013-02-20 DIAGNOSIS — S42293A Other displaced fracture of upper end of unspecified humerus, initial encounter for closed fracture: Secondary | ICD-10-CM | POA: Diagnosis not present

## 2013-02-20 DIAGNOSIS — I252 Old myocardial infarction: Secondary | ICD-10-CM | POA: Diagnosis not present

## 2013-02-20 DIAGNOSIS — I2510 Atherosclerotic heart disease of native coronary artery without angina pectoris: Secondary | ICD-10-CM | POA: Diagnosis not present

## 2013-02-20 DIAGNOSIS — S42213A Unspecified displaced fracture of surgical neck of unspecified humerus, initial encounter for closed fracture: Secondary | ICD-10-CM | POA: Diagnosis not present

## 2013-02-20 DIAGNOSIS — S0990XA Unspecified injury of head, initial encounter: Secondary | ICD-10-CM | POA: Diagnosis not present

## 2013-02-20 DIAGNOSIS — R937 Abnormal findings on diagnostic imaging of other parts of musculoskeletal system: Secondary | ICD-10-CM | POA: Diagnosis not present

## 2013-02-20 DIAGNOSIS — I69959 Hemiplegia and hemiparesis following unspecified cerebrovascular disease affecting unspecified side: Secondary | ICD-10-CM | POA: Diagnosis not present

## 2013-02-20 DIAGNOSIS — J449 Chronic obstructive pulmonary disease, unspecified: Secondary | ICD-10-CM | POA: Diagnosis not present

## 2013-02-20 DIAGNOSIS — Z7902 Long term (current) use of antithrombotics/antiplatelets: Secondary | ICD-10-CM | POA: Diagnosis not present

## 2013-02-20 DIAGNOSIS — E119 Type 2 diabetes mellitus without complications: Secondary | ICD-10-CM | POA: Diagnosis not present

## 2013-02-20 DIAGNOSIS — I10 Essential (primary) hypertension: Secondary | ICD-10-CM | POA: Diagnosis not present

## 2013-02-20 DIAGNOSIS — Z881 Allergy status to other antibiotic agents status: Secondary | ICD-10-CM | POA: Diagnosis not present

## 2013-02-20 DIAGNOSIS — Z7982 Long term (current) use of aspirin: Secondary | ICD-10-CM | POA: Diagnosis not present

## 2013-02-20 DIAGNOSIS — Z87891 Personal history of nicotine dependence: Secondary | ICD-10-CM | POA: Diagnosis not present

## 2013-02-26 DIAGNOSIS — S42209A Unspecified fracture of upper end of unspecified humerus, initial encounter for closed fracture: Secondary | ICD-10-CM | POA: Diagnosis not present

## 2013-02-28 DIAGNOSIS — E785 Hyperlipidemia, unspecified: Secondary | ICD-10-CM | POA: Diagnosis not present

## 2013-02-28 DIAGNOSIS — I10 Essential (primary) hypertension: Secondary | ICD-10-CM | POA: Diagnosis not present

## 2013-03-04 DIAGNOSIS — S42209A Unspecified fracture of upper end of unspecified humerus, initial encounter for closed fracture: Secondary | ICD-10-CM | POA: Diagnosis not present

## 2013-04-01 DIAGNOSIS — S42209A Unspecified fracture of upper end of unspecified humerus, initial encounter for closed fracture: Secondary | ICD-10-CM | POA: Diagnosis not present

## 2013-05-14 DIAGNOSIS — S42209A Unspecified fracture of upper end of unspecified humerus, initial encounter for closed fracture: Secondary | ICD-10-CM | POA: Diagnosis not present

## 2013-07-09 DIAGNOSIS — S42209A Unspecified fracture of upper end of unspecified humerus, initial encounter for closed fracture: Secondary | ICD-10-CM | POA: Diagnosis not present

## 2013-10-08 ENCOUNTER — Telehealth: Payer: Self-pay | Admitting: Interventional Cardiology

## 2013-10-08 NOTE — Telephone Encounter (Signed)
ROI faxed to [facility] @ [phone]    7.22.15/km

## 2013-10-08 NOTE — Telephone Encounter (Signed)
Records rec [facility] [place] [facility] [place] 7.22.15/km

## 2013-10-09 ENCOUNTER — Encounter: Payer: Self-pay | Admitting: Interventional Cardiology

## 2013-10-09 ENCOUNTER — Ambulatory Visit (INDEPENDENT_AMBULATORY_CARE_PROVIDER_SITE_OTHER): Payer: Medicare Other | Admitting: Interventional Cardiology

## 2013-10-09 VITALS — BP 132/74 | HR 66 | Ht 63.0 in | Wt 208.0 lb

## 2013-10-09 DIAGNOSIS — J438 Other emphysema: Secondary | ICD-10-CM | POA: Diagnosis not present

## 2013-10-09 DIAGNOSIS — IMO0001 Reserved for inherently not codable concepts without codable children: Secondary | ICD-10-CM | POA: Diagnosis not present

## 2013-10-09 DIAGNOSIS — K219 Gastro-esophageal reflux disease without esophagitis: Secondary | ICD-10-CM

## 2013-10-09 DIAGNOSIS — I201 Angina pectoris with documented spasm: Secondary | ICD-10-CM | POA: Insufficient documentation

## 2013-10-09 DIAGNOSIS — J432 Centrilobular emphysema: Secondary | ICD-10-CM | POA: Insufficient documentation

## 2013-10-09 DIAGNOSIS — I209 Angina pectoris, unspecified: Secondary | ICD-10-CM | POA: Diagnosis not present

## 2013-10-09 DIAGNOSIS — I69920 Aphasia following unspecified cerebrovascular disease: Secondary | ICD-10-CM | POA: Diagnosis not present

## 2013-10-09 DIAGNOSIS — IMO0002 Reserved for concepts with insufficient information to code with codable children: Secondary | ICD-10-CM | POA: Insufficient documentation

## 2013-10-09 DIAGNOSIS — I69320 Aphasia following cerebral infarction: Secondary | ICD-10-CM

## 2013-10-09 DIAGNOSIS — E118 Type 2 diabetes mellitus with unspecified complications: Secondary | ICD-10-CM

## 2013-10-09 DIAGNOSIS — E119 Type 2 diabetes mellitus without complications: Secondary | ICD-10-CM | POA: Insufficient documentation

## 2013-10-09 DIAGNOSIS — E1165 Type 2 diabetes mellitus with hyperglycemia: Secondary | ICD-10-CM | POA: Insufficient documentation

## 2013-10-09 NOTE — Patient Instructions (Addendum)
Your physician recommends that you continue on your current medications as directed. Please refer to the Current Medication list given to you today.  Your physician wants you to follow-up in: 6 months. You will receive a reminder letter in the mail two months in advance. If you don't receive a letter, please call our office to schedule the follow-up appointment.  

## 2013-10-09 NOTE — Progress Notes (Signed)
Patient ID: Brandy Paul, female   DOB: 04-Jun-1945, 67 y.o.   MRN: [id number]   Date: 10/09/2013 ID: Brandy Paul [birth year], MRN [id number] PCP: No primary provider on file.  Reason: Coronary artery spasm, here to establish  ASSESSMENT;  1. Coronary artery spasm, commencing in 2003, with 3 recurring episodes of prolonged discomfort associated with myocardial injury. Stable pattern of recurring chest pain intermittently over the past 11 years relieved by sublingual nitroglycerin 2. 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 Gastroesophageal reflux 4. Diabetes mellitus 2, uncontrolled 5. COPD  PLAN:  1. Refill nitroglycerin isosorbide 2. Continue aspirin 3. Clinical followup in 6 months 4. Establish with primary care physician   SUBJECTIVE: Brandy Paul is a 68 y.o. female who is here to establish for chronic cardiology followup. She has a history of coronary artery spasm. She began having difficulty in 2003. She had 3 separate occasions where enzymes release occurred with prolonged episodes of chest pain. Coronary angiography x2 was unremarkable. Subsequently, she is use nitroglycerin sublingually for resolution of episodes of chest pain. She does not know for sure but it appears no significant damage has occurred to her heart. She does have chronic dyspnea on exertion. She is limited by right sided weakness from a CVA that occurred in 2013. She denies palpitations, orthopnea, PND, syncope and claudication. She did have a recent fall with fracture of her right humerus.   Allergies  Allergen Reactions  . Ceclor [Cefaclor]     Current Outpatient Prescriptions on File Prior to Visit  Medication Sig Dispense Refill  . aspirin 81 MG tablet Take 81 mg by mouth daily.        . Calcium Carbonate-Vitamin D (OSCAL 500/200 D-3 PO) Take by mouth.        . ISOSORBIDE PO Take by mouth daily.        . Multiple Vitamins-Minerals (MULTIVITAMIN PO) Take  by mouth.        . RABEprazole (ACIPHEX) 20 MG tablet Take 20 mg by mouth 2 (two) times daily.         No current facility-administered medications on file prior to visit.    Past Medical History  Diagnosis Date  . COPD (chronic obstructive pulmonary disease)   . Diabetes mellitus   . Coronary artery spasm   . Breast cancer     Past Surgical History  Procedure Laterality Date  . Radial keratotomy    . Mastectomy      History   Social History  . Marital Status: Married    Spouse Name: N/A    Number of Children: N/A  . Years of Education: N/A   Occupational History  . Not on file.   Social History Main Topics  . Smoking status: Former Research scientist (life sciences)  . Smokeless tobacco: Not on file  . Alcohol Use: No  . Drug Use: No  . Sexual Activity: No   Other Topics Concern  . Not on file   Social History Narrative  . No narrative on file    Family History  Problem Relation Age of Onset  . CVA Mother     ROS: Appetite is stable. Difficulty ambulating because of generalized right-sided weakness. Expressive aphasia (mild). No history of GI bleeding. She tolerates aspirin without side effects. She denies palpitations, orthopnea, and PND.Marland Kitchen Other systems negative for complaints.   OBJECTIVE: BP 132/74  Pulse 66  Ht 5\' 3"  (1.6 m)  Wt 208 lb (94.348 kg)  BMI 36.85 kg/m2,  General: No acute distress, moderately obese HEENT: normal no pallor or jaundice Neck: JVD flat. Carotids absent Chest: Clear Cardiac: Murmur: None. Gallop: Normal. Rhythm: Normal. Other: Normal Abdomen: Bruit: Absent. Pulsation: 2+ bilateral Extremities: Edema: Absent. Pulses: 2+ Neuro: Normal Psych: Normal  ECG: Normal sinus rhythm with normal tracing.

## 2013-12-04 DIAGNOSIS — J449 Chronic obstructive pulmonary disease, unspecified: Secondary | ICD-10-CM | POA: Diagnosis not present

## 2013-12-04 DIAGNOSIS — F3289 Other specified depressive episodes: Secondary | ICD-10-CM | POA: Diagnosis not present

## 2013-12-04 DIAGNOSIS — Z87891 Personal history of nicotine dependence: Secondary | ICD-10-CM | POA: Diagnosis not present

## 2013-12-04 DIAGNOSIS — Z1331 Encounter for screening for depression: Secondary | ICD-10-CM | POA: Diagnosis not present

## 2013-12-04 DIAGNOSIS — I6350 Cerebral infarction due to unspecified occlusion or stenosis of unspecified cerebral artery: Secondary | ICD-10-CM | POA: Diagnosis not present

## 2013-12-04 DIAGNOSIS — S42209A Unspecified fracture of upper end of unspecified humerus, initial encounter for closed fracture: Secondary | ICD-10-CM | POA: Diagnosis not present

## 2013-12-04 DIAGNOSIS — E119 Type 2 diabetes mellitus without complications: Secondary | ICD-10-CM | POA: Diagnosis not present

## 2013-12-04 DIAGNOSIS — K219 Gastro-esophageal reflux disease without esophagitis: Secondary | ICD-10-CM | POA: Diagnosis not present

## 2013-12-04 DIAGNOSIS — C50019 Malignant neoplasm of nipple and areola, unspecified female breast: Secondary | ICD-10-CM | POA: Diagnosis not present

## 2013-12-04 DIAGNOSIS — Z23 Encounter for immunization: Secondary | ICD-10-CM | POA: Diagnosis not present

## 2013-12-04 DIAGNOSIS — F329 Major depressive disorder, single episode, unspecified: Secondary | ICD-10-CM | POA: Diagnosis not present

## 2013-12-11 DIAGNOSIS — H3530 Unspecified macular degeneration: Secondary | ICD-10-CM | POA: Diagnosis not present

## 2013-12-17 ENCOUNTER — Other Ambulatory Visit: Payer: Self-pay | Admitting: *Deleted

## 2013-12-17 MED ORDER — ISOSORBIDE MONONITRATE ER 30 MG PO TB24: 30.0000 mg | ORAL_TABLET | Freq: Every day | ORAL | Status: DC

## 2013-12-17 MED ORDER — ISOSORBIDE MONONITRATE ER 60 MG PO TB24: 60.0000 mg | ORAL_TABLET | Freq: Every day | ORAL | Status: DC

## 2013-12-23 DIAGNOSIS — H15813 Equatorial staphyloma, bilateral: Secondary | ICD-10-CM | POA: Diagnosis not present

## 2013-12-23 DIAGNOSIS — H4423 Degenerative myopia, bilateral: Secondary | ICD-10-CM | POA: Diagnosis not present

## 2013-12-23 DIAGNOSIS — H43813 Vitreous degeneration, bilateral: Secondary | ICD-10-CM | POA: Diagnosis not present

## 2014-01-23 DIAGNOSIS — H26491 Other secondary cataract, right eye: Secondary | ICD-10-CM | POA: Diagnosis not present

## 2014-01-23 DIAGNOSIS — H26492 Other secondary cataract, left eye: Secondary | ICD-10-CM | POA: Diagnosis not present

## 2014-01-23 DIAGNOSIS — Z961 Presence of intraocular lens: Secondary | ICD-10-CM | POA: Diagnosis not present

## 2014-02-02 DIAGNOSIS — E785 Hyperlipidemia, unspecified: Secondary | ICD-10-CM | POA: Diagnosis not present

## 2014-02-02 DIAGNOSIS — E119 Type 2 diabetes mellitus without complications: Secondary | ICD-10-CM | POA: Diagnosis not present

## 2014-02-02 DIAGNOSIS — C50019 Malignant neoplasm of nipple and areola, unspecified female breast: Secondary | ICD-10-CM | POA: Diagnosis not present

## 2014-02-06 DIAGNOSIS — Z961 Presence of intraocular lens: Secondary | ICD-10-CM | POA: Diagnosis not present

## 2014-02-06 DIAGNOSIS — H26492 Other secondary cataract, left eye: Secondary | ICD-10-CM | POA: Diagnosis not present

## 2014-02-09 DIAGNOSIS — Z23 Encounter for immunization: Secondary | ICD-10-CM | POA: Diagnosis not present

## 2014-02-09 DIAGNOSIS — E785 Hyperlipidemia, unspecified: Secondary | ICD-10-CM | POA: Diagnosis not present

## 2014-02-09 DIAGNOSIS — M810 Age-related osteoporosis without current pathological fracture: Secondary | ICD-10-CM | POA: Diagnosis not present

## 2014-02-09 DIAGNOSIS — K219 Gastro-esophageal reflux disease without esophagitis: Secondary | ICD-10-CM | POA: Diagnosis not present

## 2014-02-09 DIAGNOSIS — Z0000 Encounter for general adult medical examination without abnormal findings: Secondary | ICD-10-CM | POA: Diagnosis not present

## 2014-02-09 DIAGNOSIS — F329 Major depressive disorder, single episode, unspecified: Secondary | ICD-10-CM | POA: Diagnosis not present

## 2014-02-09 DIAGNOSIS — J449 Chronic obstructive pulmonary disease, unspecified: Secondary | ICD-10-CM | POA: Diagnosis not present

## 2014-02-09 DIAGNOSIS — Z79899 Other long term (current) drug therapy: Secondary | ICD-10-CM | POA: Diagnosis not present

## 2014-02-09 DIAGNOSIS — E119 Type 2 diabetes mellitus without complications: Secondary | ICD-10-CM | POA: Diagnosis not present

## 2014-02-10 DIAGNOSIS — J449 Chronic obstructive pulmonary disease, unspecified: Secondary | ICD-10-CM | POA: Diagnosis not present

## 2014-02-10 DIAGNOSIS — K219 Gastro-esophageal reflux disease without esophagitis: Secondary | ICD-10-CM | POA: Diagnosis not present

## 2014-02-10 DIAGNOSIS — M810 Age-related osteoporosis without current pathological fracture: Secondary | ICD-10-CM | POA: Diagnosis not present

## 2014-02-10 DIAGNOSIS — Z23 Encounter for immunization: Secondary | ICD-10-CM | POA: Diagnosis not present

## 2014-02-10 DIAGNOSIS — F329 Major depressive disorder, single episode, unspecified: Secondary | ICD-10-CM | POA: Diagnosis not present

## 2014-02-10 DIAGNOSIS — Z0000 Encounter for general adult medical examination without abnormal findings: Secondary | ICD-10-CM | POA: Diagnosis not present

## 2014-02-10 DIAGNOSIS — E785 Hyperlipidemia, unspecified: Secondary | ICD-10-CM | POA: Diagnosis not present

## 2014-02-10 DIAGNOSIS — Z79899 Other long term (current) drug therapy: Secondary | ICD-10-CM | POA: Diagnosis not present

## 2014-02-10 DIAGNOSIS — E119 Type 2 diabetes mellitus without complications: Secondary | ICD-10-CM | POA: Diagnosis not present

## 2014-03-15 ENCOUNTER — Other Ambulatory Visit: Payer: Self-pay | Admitting: Interventional Cardiology

## 2014-03-16 NOTE — Telephone Encounter (Signed)
Rx(s) sent to pharmacy electronically.  

## 2014-03-20 ENCOUNTER — Other Ambulatory Visit: Payer: Self-pay | Admitting: Interventional Cardiology

## 2014-04-15 ENCOUNTER — Ambulatory Visit (INDEPENDENT_AMBULATORY_CARE_PROVIDER_SITE_OTHER): Payer: Medicare Other | Admitting: Interventional Cardiology

## 2014-04-15 ENCOUNTER — Encounter: Payer: Self-pay | Admitting: Interventional Cardiology

## 2014-04-15 VITALS — BP 120/78 | HR 78 | Ht 63.0 in | Wt 209.0 lb

## 2014-04-15 DIAGNOSIS — I69920 Aphasia following unspecified cerebrovascular disease: Secondary | ICD-10-CM

## 2014-04-15 DIAGNOSIS — I201 Angina pectoris with documented spasm: Secondary | ICD-10-CM | POA: Diagnosis not present

## 2014-04-15 DIAGNOSIS — E1165 Type 2 diabetes mellitus with hyperglycemia: Secondary | ICD-10-CM

## 2014-04-15 DIAGNOSIS — IMO0002 Reserved for concepts with insufficient information to code with codable children: Secondary | ICD-10-CM

## 2014-04-15 DIAGNOSIS — I69320 Aphasia following cerebral infarction: Secondary | ICD-10-CM

## 2014-04-15 MED ORDER — NITROGLYCERIN 0.4 MG SL SUBL: 0.4000 mg | SUBLINGUAL_TABLET | SUBLINGUAL | Status: DC | PRN

## 2014-04-15 NOTE — Progress Notes (Signed)
Patient ID: Brandy Paul, female   DOB: 1945/12/19, 69 y.o.   MRN: [id number]    Cardiology Office Note   Date:  04/15/2014   ID:  Brandy, Paul Oct 22, 1945, MRN [id number]  PCP:  Velna Hatchet, MD  Cardiologist:   Sinclair Grooms, MD   Chief Complaint  Patient presents with  . Code Stroke    FOLLOW UP      History of Present Illness: Brandy Paul is a 69 y.o. female who presents for coronary artery spasm. Since I last saw her 6 months ago she has not needed nitroglycerin. Chest pain episodes are rare. They tend to be cyclical. She is accompanied by her husband. She has had no new neurological complaints. Since I last saw them he has had a stroke.    Past Medical History  Diagnosis Date  . COPD (chronic obstructive pulmonary disease)   . Diabetes mellitus   . Coronary artery spasm   . Breast cancer     Past Surgical History  Procedure Laterality Date  . Radial keratotomy    . Mastectomy       Current Outpatient Prescriptions  Medication Sig Dispense Refill  . aspirin 81 MG tablet Take 81 mg by mouth daily.      . Calcium Carbonate-Vitamin D (OSCAL 500/200 D-3 PO) Take by mouth.      . clopidogrel (PLAVIX) 75 MG tablet Take 75 mg by mouth daily.     Marland Kitchen ezetimibe (ZETIA) 10 MG tablet Take 10 mg by mouth daily.    . isosorbide mononitrate (IMDUR) 60 MG 24 hr tablet TAKE 1 TABLET (60 MG TOTAL) BY MOUTH DAILY. 90 tablet 1  . Multiple Vitamins-Minerals (MULTIVITAMIN PO) Take by mouth.      Marland Kitchen NITROSTAT 0.4 MG SL tablet Place 0.4 mg under the tongue every 5 (five) minutes as needed.     . RABEprazole (ACIPHEX) 20 MG tablet Take 20 mg by mouth 2 (two) times daily.      . sertraline (ZOLOFT) 25 MG tablet Take 25 mg by mouth daily.    Marland Kitchen tiotropium (SPIRIVA HANDIHALER) 18 MCG inhalation capsule Place 18 mcg into inhaler and inhale daily.     No current facility-administered medications for this visit.    Allergies:   Ceclor    Social History:  The patient   reports that she has quit smoking. She does not have any smokeless tobacco history on file. She reports that she does not drink alcohol or use illicit drugs.   Family History:  The patient's family history includes CVA in her mother; Heart attack in her sister; Hypertension in her sister; Stroke in her maternal grandfather.    ROS:  Please see the history of present illness.   Otherwise, review of systems are positive for none.   All other systems are reviewed and negative.    PHYSICAL EXAM: VS:  BP 120/78 mmHg  Pulse 78  Ht 5\' 3"  (1.6 m)  Wt 209 lb (94.802 kg)  BMI 37.03 kg/m2  SpO2 98% , BMI Body mass index is 37.03 kg/(m^2). GEN: Well nourished, well developed, in no acute distress HEENT: normal Neck: no JVD, carotid bruits, or masses Cardiac: RRR; no murmurs, rubs, or gallops,no edema  Respiratory:  clear to auscultation bilaterally, normal work of breathing GI: soft, nontender, nondistended, + BS MS: no deformity or atrophy Skin: warm and dry, no rash Neuro:  Strength and sensation are intact Psych: euthymic mood, full affect   EKG:  EKG is not ordered today.    Recent Labs: No results found for requested labs within last 365 days.    Lipid Panel No results found for: CHOL, TRIG, HDL, CHOLHDL, VLDL, LDLCALC, LDLDIRECT    Wt Readings from Last 3 Encounters:  04/15/14 209 lb (94.802 kg)  10/09/13 208 lb (94.348 kg)      Other studies Reviewed: Additional studies/ records that were reviewed today include: None.    ASSESSMENT AND PLAN:  1.  Coronary artery spasm, largely asymptomatic. Nitroglycerin prescriptions a hole and need to be renewed.  2. Prior CVA, with expressive aphasia  Current medicines are reviewed at length with the patient today.  The patient does not have concerns regarding medicines.  The following changes have been made:  no change  Labs/ tests ordered today include:  No orders of the defined types were placed in this encounter.      Disposition:   FU with Linard Millers in 9 months   Signed, Sinclair Grooms, MD  04/15/2014 12:33 PM    [facility] [address], [place], [place]  [address] Phone: [phone]; Fax: [phone]

## 2014-04-15 NOTE — Patient Instructions (Signed)
Your physician recommends that you continue on your current medications as directed. Please refer to the Current Medication list given to you today.  An Rx for Nitro-glycerin has been sent to your pharmacy, use as directed  Your physician wants you to follow-up in: 6-9 months with Dr.Smith You will receive a reminder letter in the mail two months in advance. If you don't receive a letter, please call our office to schedule the follow-up appointment.

## 2014-04-17 ENCOUNTER — Ambulatory Visit: Payer: Medicare Other | Admitting: Interventional Cardiology

## 2014-05-26 DIAGNOSIS — Z6837 Body mass index (BMI) 37.0-37.9, adult: Secondary | ICD-10-CM | POA: Diagnosis not present

## 2014-05-26 DIAGNOSIS — L03114 Cellulitis of left upper limb: Secondary | ICD-10-CM | POA: Diagnosis not present

## 2014-05-26 DIAGNOSIS — I639 Cerebral infarction, unspecified: Secondary | ICD-10-CM | POA: Diagnosis not present

## 2014-05-26 DIAGNOSIS — J449 Chronic obstructive pulmonary disease, unspecified: Secondary | ICD-10-CM | POA: Diagnosis not present

## 2014-05-26 DIAGNOSIS — E119 Type 2 diabetes mellitus without complications: Secondary | ICD-10-CM | POA: Diagnosis not present

## 2014-05-27 DIAGNOSIS — L03114 Cellulitis of left upper limb: Secondary | ICD-10-CM | POA: Diagnosis not present

## 2014-05-27 DIAGNOSIS — Z6837 Body mass index (BMI) 37.0-37.9, adult: Secondary | ICD-10-CM | POA: Diagnosis not present

## 2014-08-10 DIAGNOSIS — M25511 Pain in right shoulder: Secondary | ICD-10-CM | POA: Diagnosis not present

## 2014-08-10 DIAGNOSIS — I69961 Other paralytic syndrome following unspecified cerebrovascular disease affecting right dominant side: Secondary | ICD-10-CM | POA: Diagnosis not present

## 2014-08-10 DIAGNOSIS — Z6836 Body mass index (BMI) 36.0-36.9, adult: Secondary | ICD-10-CM | POA: Diagnosis not present

## 2014-08-10 DIAGNOSIS — M79651 Pain in right thigh: Secondary | ICD-10-CM | POA: Diagnosis not present

## 2014-08-10 DIAGNOSIS — B372 Candidiasis of skin and nail: Secondary | ICD-10-CM | POA: Diagnosis not present

## 2014-08-10 DIAGNOSIS — E785 Hyperlipidemia, unspecified: Secondary | ICD-10-CM | POA: Diagnosis not present

## 2014-08-14 DIAGNOSIS — E785 Hyperlipidemia, unspecified: Secondary | ICD-10-CM | POA: Diagnosis not present

## 2014-09-12 ENCOUNTER — Other Ambulatory Visit: Payer: Self-pay | Admitting: Interventional Cardiology

## 2014-11-06 DIAGNOSIS — I69961 Other paralytic syndrome following unspecified cerebrovascular disease affecting right dominant side: Secondary | ICD-10-CM | POA: Diagnosis not present

## 2014-11-06 DIAGNOSIS — Z6837 Body mass index (BMI) 37.0-37.9, adult: Secondary | ICD-10-CM | POA: Diagnosis not present

## 2014-11-06 DIAGNOSIS — H1131 Conjunctival hemorrhage, right eye: Secondary | ICD-10-CM | POA: Diagnosis not present

## 2014-11-23 NOTE — Progress Notes (Signed)
Cardiology Office Note   Date:  11/23/2014   ID:  Brandy, Paul January 23, 1946, MRN [id number]  PCP:  Brandy Hatchet, MD  Cardiologist:  Brandy Grooms, MD   Chief Complaint  Patient presents with  . Coronary Artery Disease      History of Present Illness: Brandy Paul is a 69 y.o. female who presents for coronary artery spasm, 2013 prior CVA with right sided hemi-assist, type 2 diabetes with complications, prior documented history of coronary artery spasm.  Patient has had no recent requirement for sublingual nitroglycerin. She is limited by right sided weakness from prior stroke. She has not had palpitations, syncope, edema, orthopnea, or other respiratory concern.  The patient stroke occurred in 2013. No recurrence of any neurological symptoms on top of her baseline.    Past Medical History  Diagnosis Date  . COPD (chronic obstructive pulmonary disease)   . Diabetes mellitus   . Coronary artery spasm   . Breast cancer     Past Surgical History  Procedure Laterality Date  . Radial keratotomy    . Mastectomy       Current Outpatient Prescriptions  Medication Sig Dispense Refill  . aspirin 81 MG tablet Take 81 mg by mouth daily.      . Calcium Carbonate-Vitamin D (OSCAL 500/200 D-3 PO) Take by mouth.      . clopidogrel (PLAVIX) 75 MG tablet Take 75 mg by mouth daily.     Marland Kitchen ezetimibe (ZETIA) 10 MG tablet Take 10 mg by mouth daily.    . isosorbide mononitrate (IMDUR) 60 MG 24 hr tablet TAKE 1 TABLET (60 MG TOTAL) BY MOUTH DAILY. 90 tablet 0  . Multiple Vitamins-Minerals (MULTIVITAMIN PO) Take by mouth.      . nitroGLYCERIN (NITROSTAT) 0.4 MG SL tablet Place 1 tablet (0.4 mg total) under the tongue every 5 (five) minutes as needed. 25 tablet 3  . RABEprazole (ACIPHEX) 20 MG tablet Take 20 mg by mouth 2 (two) times daily.      . sertraline (ZOLOFT) 25 MG tablet Take 25 mg by mouth daily.    Marland Kitchen tiotropium (SPIRIVA HANDIHALER) 18 MCG inhalation capsule Place 18  mcg into inhaler and inhale daily.     No current facility-administered medications for this visit.    Allergies:   Ceclor    Social History:  The patient  reports that she has quit smoking. She does not have any smokeless tobacco history on file. She reports that she does not drink alcohol or use illicit drugs.   Family History:  The patient's family history includes CVA in her mother; Heart attack in her sister; Hypertension in her sister; Stroke in her maternal grandfather.    ROS:  Please see the history of present illness.   Otherwise, review of systems are positive for recent right eye conjunctival hemorrhage. Difficulty with speech secondary to CVA [place] to 6013..   All other systems are reviewed and negative.    PHYSICAL EXAM: VS:  There were no vitals taken for this visit. , BMI There is no weight on file to calculate BMI. GEN: Well nourished, well developed, in no acute distress HEENT: normal Neck: no JVD, carotid bruits, or masses Cardiac: RRR; no murmurs, rubs, or gallops,no edema  Respiratory:  clear to auscultation bilaterally, normal work of breathing GI: soft, nontender, nondistended, + BS MS: no deformity or atrophy Skin: warm and dry, no rash Neuro:  Strength and sensation are intact. Contracture and right hemiparesis.  Psych: euthymic mood, full affect   EKG:  EKG is ordered today. The ekg ordered today demonstrates  normal   Recent Labs: No results found for requested labs within last 365 days.    Lipid Panel No results found for: CHOL, TRIG, HDL, CHOLHDL, VLDL, LDLCALC, LDLDIRECT    Wt Readings from Last 3 Encounters:  04/15/14 94.802 kg (209 lb)  10/09/13 94.348 kg (208 lb)      Other studies Reviewed: Additional studies/ records that were reviewed today include: .    ASSESSMENT AND PLAN:  1. Coronary artery spasm  relatively quiesced sent  2. Diabetes mellitus type II, uncontrolled  managed by primary care  3. CVA, old, aphasia and  right-sided weakness   No new symptoms  4. Hyperlipidemia Followed by PCP    Current medicines are reviewed at length with the patient today.  The patient does not have concerns regarding medicines.  The following changes have been made:  Interior have nitroglycerin available to use if needed.  Labs/ tests ordered today include:  No orders of the defined types were placed in this encounter.     Disposition:   FU with HS in 6 months  Signed, Brandy Grooms, MD  11/23/2014 2:57 PM    [facility] [address], [place], [place]  [address] Phone: [phone]; Fax: [phone]

## 2014-11-24 ENCOUNTER — Encounter: Payer: Self-pay | Admitting: Interventional Cardiology

## 2014-11-24 ENCOUNTER — Ambulatory Visit (INDEPENDENT_AMBULATORY_CARE_PROVIDER_SITE_OTHER): Payer: Medicare Other | Admitting: Interventional Cardiology

## 2014-11-24 VITALS — BP 130/74 | HR 61 | Ht 63.0 in | Wt 206.8 lb

## 2014-11-24 DIAGNOSIS — I69351 Hemiplegia and hemiparesis following cerebral infarction affecting right dominant side: Secondary | ICD-10-CM

## 2014-11-24 DIAGNOSIS — IMO0002 Reserved for concepts with insufficient information to code with codable children: Secondary | ICD-10-CM

## 2014-11-24 DIAGNOSIS — I69851 Hemiplegia and hemiparesis following other cerebrovascular disease affecting right dominant side: Secondary | ICD-10-CM

## 2014-11-24 DIAGNOSIS — E1165 Type 2 diabetes mellitus with hyperglycemia: Secondary | ICD-10-CM | POA: Diagnosis not present

## 2014-11-24 DIAGNOSIS — I201 Angina pectoris with documented spasm: Secondary | ICD-10-CM | POA: Diagnosis not present

## 2014-11-24 DIAGNOSIS — E785 Hyperlipidemia, unspecified: Secondary | ICD-10-CM | POA: Diagnosis not present

## 2014-11-24 DIAGNOSIS — I69320 Aphasia following cerebral infarction: Secondary | ICD-10-CM

## 2014-11-24 DIAGNOSIS — I69920 Aphasia following unspecified cerebrovascular disease: Secondary | ICD-10-CM

## 2014-11-24 MED ORDER — NITROGLYCERIN 0.4 MG SL SUBL: 0.4000 mg | SUBLINGUAL_TABLET | SUBLINGUAL | Status: DC | PRN

## 2014-11-24 NOTE — Patient Instructions (Signed)
Medication Instructions:  Your physician recommends that you continue on your current medications as directed. Please refer to the Current Medication list given to you today.   Labwork: None orderd  Testing/Procedures: None ordered   Follow-Up: Your physician wants you to follow-up in: 1 year with Dr.Smith You will receive a reminder letter in the mail two months in advance. If you don't receive a letter, please call our office to schedule the follow-up appointment.   Any Other Special Instructions Will Be Listed Below (If Applicable).   

## 2014-12-25 ENCOUNTER — Other Ambulatory Visit: Payer: Self-pay | Admitting: Interventional Cardiology

## 2015-01-01 DIAGNOSIS — H35351 Cystoid macular degeneration, right eye: Secondary | ICD-10-CM | POA: Diagnosis not present

## 2015-02-10 DIAGNOSIS — R8290 Unspecified abnormal findings in urine: Secondary | ICD-10-CM | POA: Diagnosis not present

## 2015-02-10 DIAGNOSIS — M810 Age-related osteoporosis without current pathological fracture: Secondary | ICD-10-CM | POA: Diagnosis not present

## 2015-02-10 DIAGNOSIS — N390 Urinary tract infection, site not specified: Secondary | ICD-10-CM | POA: Diagnosis not present

## 2015-02-10 DIAGNOSIS — E119 Type 2 diabetes mellitus without complications: Secondary | ICD-10-CM | POA: Diagnosis not present

## 2015-02-10 DIAGNOSIS — E784 Other hyperlipidemia: Secondary | ICD-10-CM | POA: Diagnosis not present

## 2015-02-17 DIAGNOSIS — E119 Type 2 diabetes mellitus without complications: Secondary | ICD-10-CM | POA: Diagnosis not present

## 2015-02-17 DIAGNOSIS — M810 Age-related osteoporosis without current pathological fracture: Secondary | ICD-10-CM | POA: Diagnosis not present

## 2015-02-17 DIAGNOSIS — E784 Other hyperlipidemia: Secondary | ICD-10-CM | POA: Diagnosis not present

## 2015-02-17 DIAGNOSIS — D509 Iron deficiency anemia, unspecified: Secondary | ICD-10-CM | POA: Diagnosis not present

## 2015-02-17 DIAGNOSIS — K219 Gastro-esophageal reflux disease without esophagitis: Secondary | ICD-10-CM | POA: Diagnosis not present

## 2015-02-17 DIAGNOSIS — D649 Anemia, unspecified: Secondary | ICD-10-CM | POA: Diagnosis not present

## 2015-02-17 DIAGNOSIS — Z1389 Encounter for screening for other disorder: Secondary | ICD-10-CM | POA: Diagnosis not present

## 2015-02-17 DIAGNOSIS — J449 Chronic obstructive pulmonary disease, unspecified: Secondary | ICD-10-CM | POA: Diagnosis not present

## 2015-02-17 DIAGNOSIS — Z1212 Encounter for screening for malignant neoplasm of rectum: Secondary | ICD-10-CM | POA: Diagnosis not present

## 2015-02-17 DIAGNOSIS — I69961 Other paralytic syndrome following unspecified cerebrovascular disease affecting right dominant side: Secondary | ICD-10-CM | POA: Diagnosis not present

## 2015-02-17 DIAGNOSIS — Z0000 Encounter for general adult medical examination without abnormal findings: Secondary | ICD-10-CM | POA: Diagnosis not present

## 2015-02-17 DIAGNOSIS — F325 Major depressive disorder, single episode, in full remission: Secondary | ICD-10-CM | POA: Diagnosis not present

## 2015-02-19 ENCOUNTER — Other Ambulatory Visit (HOSPITAL_COMMUNITY): Payer: Self-pay | Admitting: *Deleted

## 2015-02-22 ENCOUNTER — Ambulatory Visit (HOSPITAL_COMMUNITY)
Admission: RE | Admit: 2015-02-22 | Discharge: 2015-02-22 | Disposition: A | Discharge status: Home / Self Care | Payer: Federal, State, Local not specified - PPO | Source: Ambulatory Visit | Attending: Internal Medicine | Admitting: Internal Medicine

## 2015-02-22 DIAGNOSIS — D649 Anemia, unspecified: Secondary | ICD-10-CM | POA: Insufficient documentation

## 2015-02-22 MED ORDER — SODIUM CHLORIDE 0.9 % IV SOLN
510.0000 mg | INTRAVENOUS | Status: DC
  Administered 2015-02-22: 510 mg via INTRAVENOUS
  Filled 2015-02-22: qty 17

## 2015-02-22 NOTE — Discharge Instructions (Signed)

## 2015-02-25 ENCOUNTER — Encounter (HOSPITAL_COMMUNITY): Payer: Self-pay | Admitting: Emergency Medicine

## 2015-02-25 ENCOUNTER — Emergency Department (HOSPITAL_COMMUNITY)
Admission: EM | Admit: 2015-02-25 | Discharge: 2015-02-25 | Disposition: A | Discharge status: Home / Self Care | Payer: Medicare Other | Attending: Emergency Medicine | Admitting: Emergency Medicine

## 2015-02-25 ENCOUNTER — Emergency Department (HOSPITAL_COMMUNITY): Payer: Medicare Other

## 2015-02-25 DIAGNOSIS — Z853 Personal history of malignant neoplasm of breast: Secondary | ICD-10-CM | POA: Insufficient documentation

## 2015-02-25 DIAGNOSIS — W182XXA Fall in (into) shower or empty bathtub, initial encounter: Secondary | ICD-10-CM | POA: Insufficient documentation

## 2015-02-25 DIAGNOSIS — Y93E1 Activity, personal bathing and showering: Secondary | ICD-10-CM | POA: Insufficient documentation

## 2015-02-25 DIAGNOSIS — S92512A Displaced fracture of proximal phalanx of left lesser toe(s), initial encounter for closed fracture: Secondary | ICD-10-CM | POA: Diagnosis not present

## 2015-02-25 DIAGNOSIS — Y998 Other external cause status: Secondary | ICD-10-CM | POA: Diagnosis not present

## 2015-02-25 DIAGNOSIS — S99922A Unspecified injury of left foot, initial encounter: Secondary | ICD-10-CM | POA: Diagnosis present

## 2015-02-25 DIAGNOSIS — Z87891 Personal history of nicotine dependence: Secondary | ICD-10-CM | POA: Insufficient documentation

## 2015-02-25 DIAGNOSIS — E119 Type 2 diabetes mellitus without complications: Secondary | ICD-10-CM | POA: Diagnosis not present

## 2015-02-25 DIAGNOSIS — J449 Chronic obstructive pulmonary disease, unspecified: Secondary | ICD-10-CM | POA: Diagnosis not present

## 2015-02-25 DIAGNOSIS — I201 Angina pectoris with documented spasm: Secondary | ICD-10-CM | POA: Insufficient documentation

## 2015-02-25 DIAGNOSIS — Y92009 Unspecified place in unspecified non-institutional (private) residence as the place of occurrence of the external cause: Secondary | ICD-10-CM | POA: Insufficient documentation

## 2015-02-25 DIAGNOSIS — Z7902 Long term (current) use of antithrombotics/antiplatelets: Secondary | ICD-10-CM | POA: Diagnosis not present

## 2015-02-25 DIAGNOSIS — Z1211 Encounter for screening for malignant neoplasm of colon: Secondary | ICD-10-CM | POA: Diagnosis not present

## 2015-02-25 DIAGNOSIS — Z79899 Other long term (current) drug therapy: Secondary | ICD-10-CM | POA: Insufficient documentation

## 2015-02-25 DIAGNOSIS — W010XXA Fall on same level from slipping, tripping and stumbling without subsequent striking against object, initial encounter: Secondary | ICD-10-CM | POA: Insufficient documentation

## 2015-02-25 DIAGNOSIS — Z1212 Encounter for screening for malignant neoplasm of rectum: Secondary | ICD-10-CM | POA: Diagnosis not present

## 2015-02-25 DIAGNOSIS — S92912A Unspecified fracture of left toe(s), initial encounter for closed fracture: Secondary | ICD-10-CM

## 2015-02-25 DIAGNOSIS — S90935A Unspecified superficial injury of left lesser toe(s), initial encounter: Secondary | ICD-10-CM | POA: Diagnosis not present

## 2015-02-25 MED ORDER — HYDROCODONE-ACETAMINOPHEN 5-325 MG PO TABS: 2.0000 | ORAL_TABLET | ORAL | Status: DC | PRN

## 2015-02-25 MED ORDER — HYDROCODONE-ACETAMINOPHEN 5-325 MG PO TABS
1.0000 | ORAL_TABLET | Freq: Once | ORAL | Status: AC
  Administered 2015-02-25: 1 via ORAL
  Filled 2015-02-25: qty 1

## 2015-02-25 MED ORDER — IBUPROFEN 400 MG PO TABS: 400.0000 mg | ORAL_TABLET | Freq: Four times a day (QID) | ORAL | Status: DC | PRN

## 2015-02-25 MED ORDER — HYDROCODONE-ACETAMINOPHEN 5-325 MG PO TABS: 2.0000 | ORAL_TABLET | Freq: Once | ORAL | Status: DC

## 2015-02-25 NOTE — ED Notes (Signed)
Pt from home with c/o falling while getting into the shower this am. Pt tripped getting into the shower and reports left toe injury and pain.  Pt denies hitting head or other complaints.  NAD, A&O.

## 2015-02-25 NOTE — ED Notes (Signed)
Applied ice to pt's left foot.

## 2015-02-25 NOTE — Discharge Instructions (Signed)
You were evaluated in the ED today for your toe pain. Your found have a broken toe. Please take your Norco for extreme pain. Take your ibuprofen for moderate pain. Follow-up your doctor in one week for wound recheck. Follow-up with orthopedics if your symptoms are not improving. Return to ED immediately if your toe feels cold or numb or exceptionally red  Toe Fracture A toe fracture is a break in one of the toe bones (phalanges). CAUSES This condition may be caused by:  Dropping a heavy object on your toe.  Stubbing your toe.  Overusing your toe or doing repetitive exercise.  Twisting or stretching your toe out of place. RISK FACTORS This condition is more likely to develop in people who:  Play contact sports.  Have a bone disease.  Have a low calcium level. SYMPTOMS The main symptoms of this condition are swelling and pain in the toe. The pain may get worse with standing or walking. Other symptoms include:  Bruising.  Stiffness.  Numbness.  A change in the way the toe looks.  Broken bones that poke through the skin.  Blood beneath the toenail. DIAGNOSIS This condition is diagnosed with a physical exam. You may also have X-rays. TREATMENT  Treatment for this condition depends on the type of fracture and its severity. Treatment may involve:  Taping the broken toe to a toe that is next to it (buddy taping). This is the most common treatment for fractures in which the bone has not moved out of place (nondisplaced fracture).  Wearing a shoe that has a wide, rigid sole to protect the toe and to limit its movement.  Wearing a walking cast.  Having a procedure to move the toe back into place.  Surgery. This may be needed:  If there are many pieces of broken bone that are out of place (displaced).  If the toe joint breaks.  If the bone breaks through the skin.  Physical therapy. This is done to help regain movement and strength in the toe. You may need follow-up  X-rays to make sure that the bone is healing well and staying in position. HOME CARE INSTRUCTIONS If You Have a Cast:  Do not stick anything inside the cast to scratch your skin. Doing that increases your risk of infection.  Check the skin around the cast every day. Report any concerns to your health care provider. You may put lotion on dry skin around the edges of the cast. Do not apply lotion to the skin underneath the cast.  Do not put pressure on any part of the cast until it is fully hardened. This may take several hours.  Keep the cast clean and dry. Bathing  Do not take baths, swim, or use a hot tub until your health care provider approves. Ask your health care provider if you can take showers. You may only be allowed to take sponge baths for bathing.  If your health care provider approves bathing and showering, cover the cast or bandage (dressing) with a watertight plastic bag to protect it from water. Do not let the cast or dressing get wet. Managing Pain, Stiffness, and Swelling  If you do not have a cast, apply ice to the injured area, if directed.  Put ice in a plastic bag.  Place a towel between your skin and the bag.  Leave the ice on for 20 minutes, 2-3 times per day.  Move your toes often to avoid stiffness and to lessen swelling.  Raise (elevate)  the injured area above the level of your heart while you are sitting or lying down. Driving  Do not drive or operate heavy machinery while taking pain medicine.  Do not drive while wearing a cast on a foot that you use for driving. Activity  Return to your normal activities as directed by your health care provider. Ask your health care provider what activities are safe for you.  Perform exercises daily as directed by your health care provider or physical therapist. Safety  Do not use the injured limb to support your body weight until your health care provider says that you can. Use crutches or other assistive devices  as directed by your health care provider. General Instructions  If your toe was treated with buddy taping, follow your health care provider's instructions for changing the gauze and tape. Change it more often:  The gauze and tape get wet. If this happens, dry the space between the toes.  The gauze and tape are too tight and cause your toe to become pale or numb.  Wear a protective shoe as directed by your health care provider. If you were not given a protective shoe, wear sturdy, supportive shoes. Your shoes should not pinch your toes and should not fit tightly against your toes.  Do not use any tobacco products, including cigarettes, chewing tobacco, or e-cigarettes. Tobacco can delay bone healing. If you need help quitting, ask your health care provider.  Take medicines only as directed by your health care provider.  Keep all follow-up visits as directed by your health care provider. This is important. SEEK MEDICAL CARE IF:  You have a fever.  Your pain medicine is not helping.  Your toe is cold.  Your toe is numb.  You still have pain after one week of rest and treatment.  You still have pain after your health care provider has said that you can start walking again.  You have pain, tingling, or numbness in your foot that is not going away. SEEK IMMEDIATE MEDICAL CARE IF:  You have severe pain.  You have redness or inflammation in your toe that is getting worse.  You have pain or numbness in your toe that is getting worse.  Your toe turns blue.   This information is not intended to replace advice given to you by your health care provider. Make sure you discuss any questions you have with your health care provider.   Document Released: 03/03/2000 Document Revised: 11/25/2014 Document Reviewed: 12/31/2013 Elsevier Interactive Patient Education [employer].

## 2015-02-25 NOTE — ED Provider Notes (Signed)
CSN: [id number]     Arrival date & time 02/25/15  1105 History  By signing my name below, I, Hansel Feinstein, attest that this documentation has been prepared under the direction and in the presence of  [employer], PA-C. Electronically Signed: Hansel Feinstein, ED Scribe. 02/25/2015. 1:24 PM.    Chief Complaint  Patient presents with  . Toe Injury   The history is provided by the patient. No language interpreter was used.    HPI Comments: Brandy Paul is a 69 y.o. female with h/o COPD, DM who presents to the Emergency Department complaining of moderate left 2nd toe pain and swelling onset this morning s/p mechanical fall in the shower. No LOC or head injury. Pt also complains of left knee swelling. She states pain is worsened with movement and ambulation. She states mild to moderate relief of pain with ice applied in the ED. Pt denies taking OTC medications at home to improve symptoms. Pt is on Plavix and ASA. No other daily medications. She denies numbness, weakness, paresthesia, additional injuries.   Past Medical History  Diagnosis Date  . COPD (chronic obstructive pulmonary disease) ([place])   . Diabetes mellitus   . Coronary artery spasm ([place])   . Breast cancer [facility])    Past Surgical History  Procedure Laterality Date  . Radial keratotomy    . Mastectomy     Family History  Problem Relation Age of Onset  . CVA Mother   . Heart attack Sister   . Stroke Maternal Grandfather   . Hypertension Sister    Social History  Substance Use Topics  . Smoking status: Former Research scientist (life sciences)  . Smokeless tobacco: Never Used  . Alcohol Use: No   OB History    No data available     Review of Systems A 10 point review of systems was completed and was negative except for pertinent positives and negatives as mentioned in the history of present illness.   Allergies  Ceclor  Home Medications   Prior to Admission medications   Medication Sig Start Date End Date Taking? Authorizing Provider   aspirin 81 MG tablet Take 81 mg by mouth daily.      Historical Provider, MD  atorvastatin (LIPITOR) 20 MG tablet Take 20 mg by mouth daily. 10/31/14   Historical Provider, MD  Calcium Carbonate-Vitamin D (OSCAL 500/200 D-3 PO) Take 1 tablet by mouth daily.     Historical Provider, MD  clopidogrel (PLAVIX) 75 MG tablet Take 75 mg by mouth daily.  10/02/13   Historical Provider, MD  ezetimibe (ZETIA) 10 MG tablet Take 10 mg by mouth daily.    Historical Provider, MD  HYDROcodone-acetaminophen (NORCO) 5-325 MG tablet Take 2 tablets by mouth every 4 (four) hours as needed. 02/25/15   Comer Locket, PA-C  ibuprofen (ADVIL,MOTRIN) 400 MG tablet Take 1 tablet (400 mg total) by mouth every 6 (six) hours as needed. 02/25/15   Comer Locket, PA-C  isosorbide mononitrate (IMDUR) 60 MG 24 hr tablet Take 1 tablet (60 mg total) by mouth daily. 12/25/14   Belva Crome, MD  Multiple Vitamins-Minerals (MULTIVITAMIN PO) Take 1 tablet by mouth daily.     Historical Provider, MD  nitroGLYCERIN (NITROSTAT) 0.4 MG SL tablet Place 1 tablet (0.4 mg total) under the tongue every 5 (five) minutes as needed. 11/24/14   Belva Crome, MD  RABEprazole (ACIPHEX) 20 MG tablet Take 20 mg by mouth daily. 10/29/14   Historical Provider, MD  sertraline (ZOLOFT) 25 MG  tablet Take 25 mg by mouth 2 (two) times daily. 11/17/14   Historical Provider, MD  tiotropium (SPIRIVA HANDIHALER) 18 MCG inhalation capsule Place 18 mcg into inhaler and inhale daily.    Historical Provider, MD   BP 126/73 mmHg  Pulse 69  Temp(Src) 97.7 F (36.5 C) (Oral)  Resp 18  SpO2 96% Physical Exam  Constitutional: She is oriented to person, place, and time. She appears well-developed and well-nourished.  Awake, alert, nontoxic appearance.   HENT:  Head: Normocephalic and atraumatic.  Eyes: Conjunctivae and EOM are normal. Pupils are equal, round, and reactive to light.  Neck: Normal range of motion. Neck supple.  Cardiovascular: Normal rate, regular  rhythm, normal heart sounds and intact distal pulses.  Exam reveals no gallop and no friction rub.   No murmur heard. Heart sounds normal. RRR. DPs intact.  Pulmonary/Chest: Effort normal and breath sounds normal. No respiratory distress. She has no wheezes. She has no rales.  Lungs CTA bilaterally.   Abdominal: Soft. Bowel sounds are normal. She exhibits no distension. There is no tenderness.  Musculoskeletal: Normal range of motion. She exhibits edema and tenderness.  Brisk cap refill. Tenderness noted to the base of the 2nd proximal phalanx of the left foot with ecchymosis and mild swelling.   Neurological: She is alert and oriented to person, place, and time.  Skin: Skin is warm and dry.  Psychiatric: She has a normal mood and affect. Her behavior is normal.  Nursing note and vitals reviewed.  ED Course  Procedures (including critical care time) DIAGNOSTIC STUDIES: Oxygen Saturation is 98% on RA, normal by my interpretation.    COORDINATION OF CARE: 1:23 PM Discussed treatment plan with pt at bedside and pt agreed to plan. Plan to XR left foot and left knee. Pain management in the ED.   Imaging Review Dg Foot Complete Left  02/25/2015  CLINICAL DATA:  Fall today.  Toe injury EXAM: LEFT FOOT - COMPLETE 3+ VIEW COMPARISON:  None. FINDINGS: Fracture of the second proximal phalanx involving the distal portion. Mild impaction and mild displacement. No other fracture. IMPRESSION: Mildly displaced fracture of the second proximal phalanx. Electronically Signed   By: Franchot Gallo M.D.   On: 02/25/2015 13:01   I have personally reviewed and evaluated these images as part of my medical decision-making.  Meds given in ED:  Medications  HYDROcodone-acetaminophen (NORCO/VICODIN) 5-325 MG per tablet 1 tablet (1 tablet Oral Given 02/25/15 1416)    New Prescriptions   HYDROCODONE-ACETAMINOPHEN (NORCO) 5-325 MG TABLET    Take 2 tablets by mouth every 4 (four) hours as needed.   IBUPROFEN  (ADVIL,MOTRIN) 400 MG TABLET    Take 1 tablet (400 mg total) by mouth every 6 (six) hours as needed.     Filed Vitals:   02/25/15 1151 02/25/15 1411  BP: 113/58 126/73  Pulse: 71 69  Temp: 97.8 F (36.6 C) 97.7 F (36.5 C)  Resp: 14 18    MDM   Final diagnoses:  Broken toe, left, closed, initial encounter    Patient X-Ray showed mildly displaced fracture of the second proximal phalanx. Neurovascularly intact. No hemarthrosis. Pt advised to follow up with orthopedics. Patient given surgical boot while in ED, buddy taping, conservative therapy recommended and discussed. Short course pain medicines prescribed and discussed no driving/precautions while being on medication. Patient will be discharged home & is agreeable with above plan. Returns precautions discussed. Pt appears safe for discharge.   I personally performed the services described  in this documentation, which was scribed in my presence. The recorded information has been reviewed and is accurate.   Comer Locket, PA-C 02/25/15 1418  Gareth Morgan, MD 02/26/15 [phone]

## 2015-02-26 ENCOUNTER — Other Ambulatory Visit (HOSPITAL_COMMUNITY): Payer: Self-pay | Admitting: *Deleted

## 2015-03-01 ENCOUNTER — Ambulatory Visit (HOSPITAL_COMMUNITY)
Admission: RE | Admit: 2015-03-01 | Discharge: 2015-03-01 | Disposition: A | Discharge status: Home / Self Care | Payer: Medicare Other | Source: Ambulatory Visit | Attending: Internal Medicine | Admitting: Internal Medicine

## 2015-03-01 DIAGNOSIS — D649 Anemia, unspecified: Secondary | ICD-10-CM | POA: Diagnosis not present

## 2015-03-01 MED ORDER — SODIUM CHLORIDE 0.9 % IV SOLN
510.0000 mg | INTRAVENOUS | Status: DC
  Administered 2015-03-01: 510 mg via INTRAVENOUS
  Filled 2015-03-01: qty 17

## 2015-03-08 DIAGNOSIS — Z6833 Body mass index (BMI) 33.0-33.9, adult: Secondary | ICD-10-CM | POA: Diagnosis not present

## 2015-03-08 DIAGNOSIS — M79675 Pain in left toe(s): Secondary | ICD-10-CM | POA: Diagnosis not present

## 2015-03-10 ENCOUNTER — Encounter: Payer: Self-pay | Admitting: Gastroenterology

## 2015-03-30 ENCOUNTER — Ambulatory Visit: Payer: Medicare Other | Admitting: Gastroenterology

## 2015-04-02 ENCOUNTER — Encounter: Payer: Self-pay | Admitting: Emergency Medicine

## 2015-04-06 ENCOUNTER — Ambulatory Visit (INDEPENDENT_AMBULATORY_CARE_PROVIDER_SITE_OTHER): Payer: Medicare Other | Admitting: Gastroenterology

## 2015-04-06 ENCOUNTER — Telehealth: Payer: Self-pay | Admitting: Emergency Medicine

## 2015-04-06 ENCOUNTER — Encounter: Payer: Self-pay | Admitting: Gastroenterology

## 2015-04-06 VITALS — BP 134/80 | HR 64 | Ht 63.0 in | Wt 206.4 lb

## 2015-04-06 DIAGNOSIS — R195 Other fecal abnormalities: Secondary | ICD-10-CM | POA: Diagnosis not present

## 2015-04-06 DIAGNOSIS — D509 Iron deficiency anemia, unspecified: Secondary | ICD-10-CM

## 2015-04-06 DIAGNOSIS — Z7902 Long term (current) use of antithrombotics/antiplatelets: Secondary | ICD-10-CM

## 2015-04-06 NOTE — Progress Notes (Signed)
04/06/2015 CHRISTABELLE SALAMEH [id number] 09/15/1945   HISTORY OF PRESENT ILLNESS:  This is a 70 year old female who presents to our office today with her husband at the request of her PCP, Dr. Ardeth Perfect, to discuss colonoscopy. The patient has history of stroke 4 years ago and has residual weakness on her right side. She requires a lot of help from her husband at home. Her last colonoscopy was several years ago. She recently had a positive Cologuard study by her PCP. She also has iron deficiency anemia and has received iron infusions twice in the month of December. She is on Plavix for her history of stroke. She denies any GI complaints. Her husband reports that he does help her a lot of times with bowel movements and on rare occasion she will have some very light pink colored blood on the toilet paper.  Labs from PCPs office are being sent to be scanned, but hemoglobin from November 23 prior to her iron infusions was 9.1 g. MCV was 69.8, serum iron 22, iron percent sat was 6, and ferritin was 7.5. Folic acid and vitamin B12 were normal.  Past Medical History  Diagnosis Date  . COPD (chronic obstructive pulmonary disease) ([facility])     hx of tobacco use  . Diabetes mellitus   . Coronary artery spasm ([place])   . Breast cancer ([place])   . IDA (iron deficiency anemia)   . GERD (gastroesophageal reflux disease)   . 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 Depression   . Hyperlipemia   . Osteoporosis   . Stroke [facility])     paralytic syndrome of dominant left side  . Toe fracture, left    Past Surgical History  Procedure Laterality Date  . Radial keratotomy    . Mastectomy Left   . Abdominal hysterectomy    . Tonsillectomy      reports that she quit smoking about 21 years ago. Her smoking use included Cigarettes. She has never used smokeless tobacco. She reports that she does not drink alcohol or use illicit drugs. family history includes Arthritis in her sister; CVA in her mother; Diabetes in  her sister; Heart attack in her sister; Hypertension in her sister; Liver cancer in her mother; Stroke in her maternal grandfather. Allergies  Allergen Reactions  . Ceclor [Cefaclor] Other (See Comments)    REACTION: "SEVERE HEADACHE"      Outpatient Encounter Prescriptions as of 04/06/2015  Medication Sig  . aspirin 81 MG tablet Take 81 mg by mouth daily.    Marland Kitchen atorvastatin (LIPITOR) 20 MG tablet Take 20 mg by mouth daily.  . Calcium Carbonate-Vitamin D (OSCAL 500/200 D-3 PO) Take 1 tablet by mouth daily.   . clopidogrel (PLAVIX) 75 MG tablet Take 75 mg by mouth daily.   Marland Kitchen ezetimibe (ZETIA) 10 MG tablet Take 10 mg by mouth daily.  . isosorbide mononitrate (IMDUR) 60 MG 24 hr tablet Take 1 tablet (60 mg total) by mouth daily.  . Multiple Vitamins-Minerals (MULTIVITAMIN PO) Take 1 tablet by mouth daily.   . nitroGLYCERIN (NITROSTAT) 0.4 MG SL tablet Place 1 tablet (0.4 mg total) under the tongue every 5 (five) minutes as needed.  . RABEprazole (ACIPHEX) 20 MG tablet Take 20 mg by mouth daily.  . sertraline (ZOLOFT) 25 MG tablet Take 25 mg by mouth 2 (two) times daily.  Marland Kitchen tiotropium (SPIRIVA HANDIHALER) 18 MCG inhalation capsule Place 18 mcg into inhaler and inhale daily.  . [DISCONTINUED] HYDROcodone-acetaminophen ([address]) 5-325  MG tablet Take 2 tablets by mouth every 4 (four) hours as needed.  . [DISCONTINUED] ibuprofen (ADVIL,MOTRIN) 400 MG tablet Take 1 tablet (400 mg total) by mouth every 6 (six) hours as needed.   No facility-administered encounter medications on file as of 04/06/2015.     REVIEW OF SYSTEMS  : All other systems reviewed and negative except where noted in the History of Present Illness.   PHYSICAL EXAM: BP 134/80 mmHg  Pulse 64  Ht 5\' 3"  (1.6 m)  Wt 206 lb 6 oz (93.611 kg)  BMI 36.57 kg/m2 General: Well developed white female in no acute distress Head: Normocephalic and atraumatic Eyes:  Sclerae anicteric,conjunctiva pink. Ears: Normal auditory acuity Lungs:  Clear throughout to auscultation Heart: Regular rate and rhythm Abdomen: Soft, non-distended.  Normal bowel sounds.  Non-tender. Rectal:  Will be done at the time of colonoscopy. Musculoskeletal: Symmetrical with no gross deformities. Skin: No lesions on visible extremities Extremities: No edema  Neurological: Alert oriented x 4; has residual right sided weakness Psychological:  Alert and cooperative. Normal mood and affect  ASSESSMENT AND PLAN: -This is a 70 year old female who has iron deficiency anemia and a positive Cologuard study. She needs colonoscopy since her last was several years ago. Due to residual weakness from her CVA 4 years ago her husband is concerned about her prepping at home. We will have her admitted to the hospital on our service the day prior to her inpatient colonoscopy so that she can prep at the hospital. Discuss with Dr. Silverio Decamp and she is agreeable.  She already received IV iron infusions per PCP. -Chronic anti-platelet use with Plavix for stroke:  Hold Plavix for 5 days before procedure - will instruct when and how to resume after procedure. Risks and benefits of procedure including bleeding, perforation, infection, missed lesions, medication reactions and possible hospitalization or surgery if complications occur explained. Additional rare but real risk of cardiovascular event such as heart attack or ischemia/infarct of other organs off of Plavix explained and need to seek urgent help if this occurs. Will communicate by phone or EMR with patient's prescribing provider, Dr. Ardeth Perfect, to confirm that holding Plavix is reasonable in this case.    CC:  Velna Hatchet, MD

## 2015-04-06 NOTE — Patient Instructions (Addendum)
You have been scheduled for a colonoscopy.  If you use inhalers (even only as needed), please bring them with you on the day of your procedure.  We will contact Dr. Ardeth Perfect about holding your Plavix prior to your procedure. If you have not heard anything 1 week before your procedure, please contact the office.   Brandy Paul will call you to discuss admission to the hospital.

## 2015-04-06 NOTE — Telephone Encounter (Signed)
04/06/2015   RE: Brandy Paul DOB: 09-25-1945 MRN: [id number]   Dear Dr. Ardeth Perfect,    We have scheduled the above patient for an endoscopic procedure. Our records show that she is on anticoagulation therapy.   Please advise as to how long the patient may come off her therapy of Plavix prior to the procedure, which is scheduled for 04/29/15.  Please fax back/ or route the completed form to [facility].   Sincerely,   Tinnie Gens, [place]

## 2015-04-09 NOTE — Progress Notes (Signed)
Reviewed and agree with documentation and assessment and plan. K. Veena Nandigam , MD   

## 2015-04-14 ENCOUNTER — Encounter: Payer: Self-pay | Admitting: Gastroenterology

## 2015-04-14 ENCOUNTER — Encounter: Payer: Self-pay | Admitting: Emergency Medicine

## 2015-04-16 ENCOUNTER — Encounter: Payer: Self-pay | Admitting: Emergency Medicine

## 2015-04-16 NOTE — Telephone Encounter (Signed)
Dr. Ardeth Perfect OK'd Plavix to be held 5 days before and then resume the day after. Patients husband informed and will tell patient.

## 2015-04-20 ENCOUNTER — Encounter (HOSPITAL_COMMUNITY): Payer: Self-pay | Admitting: *Deleted

## 2015-04-28 ENCOUNTER — Encounter (HOSPITAL_COMMUNITY): Payer: Self-pay

## 2015-04-28 ENCOUNTER — Observation Stay (HOSPITAL_COMMUNITY)
Admission: AD | Admit: 2015-04-28 | Discharge: 2015-04-29 | Disposition: A | Discharge status: Home / Self Care | Payer: Medicare Other | Source: Ambulatory Visit | Attending: Gastroenterology | Admitting: Gastroenterology

## 2015-04-28 DIAGNOSIS — F329 Major depressive disorder, single episode, unspecified: Secondary | ICD-10-CM | POA: Insufficient documentation

## 2015-04-28 DIAGNOSIS — Z853 Personal history of malignant neoplasm of breast: Secondary | ICD-10-CM | POA: Diagnosis not present

## 2015-04-28 DIAGNOSIS — Z87891 Personal history of nicotine dependence: Secondary | ICD-10-CM | POA: Insufficient documentation

## 2015-04-28 DIAGNOSIS — E119 Type 2 diabetes mellitus without complications: Secondary | ICD-10-CM | POA: Diagnosis not present

## 2015-04-28 DIAGNOSIS — Z79899 Other long term (current) drug therapy: Secondary | ICD-10-CM | POA: Insufficient documentation

## 2015-04-28 DIAGNOSIS — D509 Iron deficiency anemia, unspecified: Secondary | ICD-10-CM | POA: Diagnosis not present

## 2015-04-28 DIAGNOSIS — Z7982 Long term (current) use of aspirin: Secondary | ICD-10-CM | POA: Insufficient documentation

## 2015-04-28 DIAGNOSIS — E785 Hyperlipidemia, unspecified: Secondary | ICD-10-CM | POA: Insufficient documentation

## 2015-04-28 DIAGNOSIS — Z9010 Acquired absence of unspecified breast and nipple: Secondary | ICD-10-CM | POA: Insufficient documentation

## 2015-04-28 DIAGNOSIS — I69351 Hemiplegia and hemiparesis following cerebral infarction affecting right dominant side: Secondary | ICD-10-CM | POA: Diagnosis not present

## 2015-04-28 DIAGNOSIS — Z7902 Long term (current) use of antithrombotics/antiplatelets: Secondary | ICD-10-CM | POA: Diagnosis not present

## 2015-04-28 DIAGNOSIS — J449 Chronic obstructive pulmonary disease, unspecified: Secondary | ICD-10-CM | POA: Insufficient documentation

## 2015-04-28 DIAGNOSIS — I201 Angina pectoris with documented spasm: Secondary | ICD-10-CM | POA: Insufficient documentation

## 2015-04-28 DIAGNOSIS — K219 Gastro-esophageal reflux disease without esophagitis: Secondary | ICD-10-CM | POA: Insufficient documentation

## 2015-04-28 LAB — COMPREHENSIVE METABOLIC PANEL
ALBUMIN: 3.4 g/dL — AB (ref 3.5–5.0)
ALT: 16 U/L (ref 14–54)
AST: 24 U/L (ref 15–41)
Alkaline Phosphatase: 78 U/L (ref 38–126)
Anion gap: 7 (ref 5–15)
BUN: 15 mg/dL (ref 6–20)
CHLORIDE: 103 mmol/L (ref 101–111)
CO2: 28 mmol/L (ref 22–32)
Calcium: 8.6 mg/dL — ABNORMAL LOW (ref 8.9–10.3)
Creatinine, Ser: 0.79 mg/dL (ref 0.44–1.00)
GFR calc Af Amer: >60 mL/min (ref >60)
GFR calc non Af Amer: >60 mL/min (ref >60)
GLUCOSE: 86 mg/dL (ref 65–99)
POTASSIUM: 4.1 mmol/L (ref 3.5–5.1)
SODIUM: 138 mmol/L (ref 135–145)
Total Bilirubin: 0.4 mg/dL (ref 0.3–1.2)
Total Protein: 6.4 g/dL — ABNORMAL LOW (ref 6.5–8.1)

## 2015-04-28 LAB — CBC
HEMATOCRIT: 38.7 % (ref 36.0–46.0)
HEMOGLOBIN: 12.4 g/dL (ref 12.0–15.0)
MCH: 26.3 pg (ref 26.0–34.0)
MCHC: 32 g/dL (ref 30.0–36.0)
MCV: 82.2 fL (ref 78.0–100.0)
Platelets: 147 10*3/uL — ABNORMAL LOW (ref 150–400)
RBC: 4.71 MIL/uL (ref 3.87–5.11)
RDW: 24.1 % — AB (ref 11.5–15.5)
WBC: 6.1 10*3/uL (ref 4.0–10.5)

## 2015-04-28 LAB — PROTIME-INR
INR: 1.04 (ref 0.00–1.49)
Prothrombin Time: 13.4 seconds (ref 11.6–15.2)

## 2015-04-28 MED ORDER — TIOTROPIUM BROMIDE MONOHYDRATE 18 MCG IN CAPS
18.0000 ug | ORAL_CAPSULE | Freq: Every day | RESPIRATORY_TRACT | Status: DC
  Administered 2015-04-29: 18 ug via RESPIRATORY_TRACT
  Filled 2015-04-28: qty 5

## 2015-04-28 MED ORDER — ONDANSETRON HCL 4 MG PO TABS: 4.0000 mg | ORAL_TABLET | Freq: Four times a day (QID) | ORAL | Status: DC | PRN

## 2015-04-28 MED ORDER — PEG-KCL-NACL-NASULF-NA ASC-C 100 G PO SOLR
0.5000 | Freq: Once | ORAL | Status: AC
  Administered 2015-04-28: 100 g via ORAL
  Filled 2015-04-28: qty 1

## 2015-04-28 MED ORDER — SODIUM CHLORIDE 0.9% FLUSH
3.0000 mL | Freq: Two times a day (BID) | INTRAVENOUS | Status: DC
  Administered 2015-04-28: 3 mL via INTRAVENOUS

## 2015-04-28 MED ORDER — SODIUM CHLORIDE 0.9 % IV SOLN: 250.0000 mL | INTRAVENOUS | Status: DC | PRN

## 2015-04-28 MED ORDER — ISOSORBIDE MONONITRATE ER 60 MG PO TB24
60.0000 mg | ORAL_TABLET | Freq: Every day | ORAL | Status: DC
  Administered 2015-04-28 – 2015-04-29 (×2): 60 mg via ORAL
  Filled 2015-04-28 (×2): qty 1

## 2015-04-28 MED ORDER — PANTOPRAZOLE SODIUM 40 MG PO TBEC
40.0000 mg | DELAYED_RELEASE_TABLET | Freq: Every day | ORAL | Status: DC
  Administered 2015-04-28 – 2015-04-29 (×2): 40 mg via ORAL
  Filled 2015-04-28 (×2): qty 1

## 2015-04-28 MED ORDER — SODIUM CHLORIDE 0.9% FLUSH: 3.0000 mL | INTRAVENOUS | Status: DC | PRN

## 2015-04-28 MED ORDER — PEG-KCL-NACL-NASULF-NA ASC-C 100 G PO SOLR: 1.0000 | Freq: Once | ORAL | Status: DC

## 2015-04-28 MED ORDER — ATORVASTATIN CALCIUM 20 MG PO TABS
20.0000 mg | ORAL_TABLET | Freq: Every day | ORAL | Status: AC
Start: 2015-04-28 — End: 2015-04-28
  Administered 2015-04-28: 20 mg via ORAL
  Filled 2015-04-28: qty 1

## 2015-04-28 MED ORDER — PEG-KCL-NACL-NASULF-NA ASC-C 100 G PO SOLR
0.5000 | Freq: Once | ORAL | Status: AC
  Administered 2015-04-29: 100 g via ORAL

## 2015-04-28 MED ORDER — NITROGLYCERIN 0.4 MG SL SUBL: 0.4000 mg | SUBLINGUAL_TABLET | SUBLINGUAL | Status: DC | PRN

## 2015-04-28 MED ORDER — ACETAMINOPHEN 325 MG PO TABS: 650.0000 mg | ORAL_TABLET | Freq: Four times a day (QID) | ORAL | Status: DC | PRN

## 2015-04-28 MED ORDER — SODIUM CHLORIDE 0.9 % IV SOLN
INTRAVENOUS | Status: DC
  Administered 2015-04-28: 19:00:00 via INTRAVENOUS

## 2015-04-28 MED ORDER — ACETAMINOPHEN 650 MG RE SUPP: 650.0000 mg | Freq: Four times a day (QID) | RECTAL | Status: DC | PRN

## 2015-04-28 MED ORDER — ONDANSETRON HCL 4 MG/2ML IJ SOLN: 4.0000 mg | Freq: Four times a day (QID) | INTRAMUSCULAR | Status: DC | PRN

## 2015-04-28 MED ORDER — HYDROCODONE-ACETAMINOPHEN 5-325 MG PO TABS: 1.0000 | ORAL_TABLET | ORAL | Status: DC | PRN

## 2015-04-28 MED ORDER — EZETIMIBE 10 MG PO TABS
10.0000 mg | ORAL_TABLET | Freq: Every day | ORAL | Status: DC
  Administered 2015-04-28 – 2015-04-29 (×2): 10 mg via ORAL
  Filled 2015-04-28 (×2): qty 1

## 2015-04-28 MED ORDER — SERTRALINE HCL 50 MG PO TABS
50.0000 mg | ORAL_TABLET | Freq: Every day | ORAL | Status: DC
  Administered 2015-04-28 – 2015-04-29 (×2): 50 mg via ORAL
  Filled 2015-04-28 (×2): qty 1

## 2015-04-28 NOTE — H&P (Signed)
[facility] Admission Note   Primary Care Physician:  Velna Hatchet, MD Primary Gastroenterologist:  Dr. Silverio Decamp  CHIEF COMPLAINT:  Iron deficiency anemia  HPI: Brandy Paul is a 70 y.o. female  With a history of coronary artery spasm,  Prior CVA with right-sided hemiparesis, type 2 diabetes with complications, COPD, and breast cancer.  She was recently referred to the GI ofice by her PCP to discuss colonoscopy. The patient has a history of a stroke 4 years ago and has residual weakness on the right side. She requires help from her husband at home. Her last colonoscopy was several years ago. She recently had a positive Cologard test by her primary car physician. She also has iron deficiency anemia and has received iron infusions twice in the month of December. She has been on Plavix per her strke , last took this on February 2 in anticipation of her colonoscopy tomorrow. She denies any GI complaints. Her husband reports that he does help her a lot at times with bowel movements and on rare occasion she will have some light pink colored blood on the toilet tissue. Laboratory studies from 02/10/2015 prior to her iron infusions showed a hemoglobin of 9.1, MCV 69.8, serum iron 22  , iron percent sat was 6 and ferritin was 7.5. Folc acid and vitamin B-12 were normal.   Past Medical History  Diagnosis Date  . COPD (chronic obstructive pulmonary disease) ([facility])     hx of tobacco use  . 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 IDA (iron deficiency anemia)   . GERD (gastroesophageal reflux disease)   . Depression   . Hyperlipemia   . Osteoporosis   . Toe fracture, left   . Stroke [facility]) 2013    paralytic syndrome of dominant left side  . Diabetes mellitus     hx of prediabetes priot to cva  . Breast cancer ([place]) 2005    left  . 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 Radial keratotomy  Bilateral   . Tonsillectomy    . Abdominal hysterectomy      complete  . Mastectomy Left     Prior to Admission medications   Medication Sig Start Date End Date Taking? Authorizing Provider  acetaminophen (TYLENOL) 500 MG tablet Take 500 mg by mouth every 6 (six) hours as needed for mild pain or headache.    Historical Provider, MD  aspirin 81 MG tablet Take 81 mg by mouth daily.      Historical Provider, MD  atorvastatin (LIPITOR) 20 MG tablet Take 20 mg by mouth daily. 10/31/14   Historical Provider, MD  Calcium Carbonate-Vitamin D (OSCAL 500/200 D-3 PO) Take 1 tablet by mouth daily.     Historical Provider, MD  clopidogrel (PLAVIX) 75 MG tablet Take 75 mg by mouth daily.  10/02/13   Historical Provider, MD  ezetimibe (ZETIA) 10 MG tablet Take 10 mg by mouth daily.    Historical Provider, MD  isosorbide mononitrate (IMDUR) 60 MG 24 hr tablet Take 1 tablet (60 mg total) by mouth daily. 12/25/14   Belva Crome, MD  Multiple Vitamins-Minerals (MULTIVITAMIN PO) Take 1 tablet by mouth daily.     Historical Provider, MD  nitroGLYCERIN (NITROSTAT) 0.4 MG SL tablet Place 1 tablet (0.4 mg total) under the tongue every 5 (five) minutes as needed. Patient taking differently: Place 0.4 mg under the tongue every 5 (five) minutes as needed  for chest pain.  11/24/14   Belva Crome, MD  RABEprazole (ACIPHEX) 20 MG tablet Take 20 mg by mouth daily. 10/29/14   Historical Provider, MD  sertraline (ZOLOFT) 25 MG tablet Take 50 mg by mouth daily.  11/17/14   Historical Provider, MD  tiotropium (SPIRIVA HANDIHALER) 18 MCG inhalation capsule Place 18 mcg into inhaler and inhale daily as needed (DIFFICULTY BREATHING).     Historical Provider, MD    No current facility-administered medications for this encounter.    Allergies as of 04/07/2015 - Review Complete 04/06/2015  Allergen Reaction Noted  . Ceclor [cefaclor] Other (See Comments) 03/10/2011    Family History  Problem Relation Age of Onset  . CVA Mother    . Heart attack Sister   . Stroke Maternal Grandfather   . Hypertension Sister   . Liver cancer Mother   . Diabetes Sister   . Arthritis Sister     Social History   Social History  . Marital Status: Married    Spouse Name: N/A  . Number of Children: 2  . Years of Education: N/A   Occupational History  . retired    Social History Main Topics  . Smoking status: Former Smoker    Types: Cigarettes    Quit date: 02/17/1994  . Smokeless tobacco: Never Used  . Alcohol Use: No  . Drug Use: No  . Sexual Activity: No   Other Topics Concern  . Not on file   Social History Narrative    REVIEW OF SYSTEMS:   All other systems reviewed and negative except for noted in the history of present illness.   PHYSICAL EXAM:  Pulse Rate:  [63] 63 (02/08 1420) Resp:  [18] 18 (02/08 1420) BP: (133)/(78) 133/78 mmHg (02/08 1420) SpO2:  [95 %] 95 % (02/08 1420) Weight:  [205 lb 6.4 oz (93.169 kg)] 205 lb 6.4 oz (93.169 kg) (02/08 1420) Last BM Date: 04/26/15 General: Well developed white female in no acute distress Head: Normocephalic and atraumatic Eyes: Sclerae anicteric,conjunctiva pink. Ears: Normal auditory acuity Lungs: Clear throughout to auscultation Heart: Regular rate and rhythm Abdomen: Soft, non-distended. Normal bowel sounds. Non-tender. Rectal: Will be done at the time of colonoscopy. Musculoskeletal: Symmetrical with no gross deformities. Skin: No lesions on visible extremities Extremities: No edema  Neurological: Alert oriented x 4; has residual right sided weakness Psychological: Alert and cooperative. Normal mood and affect      IMPRESSION/PLAN:   70 year old female with a history of iron deiciency anemia and a positive cologard test,  Admitted for colonoscopy. Patient has right-sided weakness  Due to her CVA and is unable to prep at home. Patient is admitted so she can  Prep at the hospital and have an inpatient colonoscopy.  Patient has been off her  Plavix for 5 days.The risks, benefits, and alternatives to colonoscopy with possible biopsy and possible polypectomy were discussed with the patient and they consent to proceed.     LOS: 0 days   Hvozdovic, Vita Barley  04/28/2015, 3:18 PM Pager [phone] Mon-Fri 8a-5p [phone] after 5p, weekends, holidays    Attending physician's note   I have taken a history, examined the patient and reviewed the chart. I agree with the Advanced Practitioner's note, impression and recommendations. We will proceed with colonoscopy, The risks and benefits as well as alternatives of endoscopic procedure(s) have been discussed and reviewed. All questions answered. The patient agrees to proceed.   Damaris Hippo, MD [phone] Mon-Fri 8a-5p [phone] after 5p,  weekends, holidays

## 2015-04-29 ENCOUNTER — Encounter (HOSPITAL_COMMUNITY): Payer: Self-pay | Admitting: Certified Registered"

## 2015-04-29 ENCOUNTER — Encounter (HOSPITAL_COMMUNITY)
Admission: AD | Disposition: A | Discharge status: Home / Self Care | Payer: Self-pay | Source: Ambulatory Visit | Attending: Gastroenterology

## 2015-04-29 ENCOUNTER — Ambulatory Visit (HOSPITAL_COMMUNITY): Admission: RE | Admit: 2015-04-29 | Payer: Medicare Other | Source: Ambulatory Visit | Admitting: Gastroenterology

## 2015-04-29 DIAGNOSIS — E119 Type 2 diabetes mellitus without complications: Secondary | ICD-10-CM | POA: Diagnosis not present

## 2015-04-29 DIAGNOSIS — D509 Iron deficiency anemia, unspecified: Secondary | ICD-10-CM | POA: Diagnosis not present

## 2015-04-29 DIAGNOSIS — J449 Chronic obstructive pulmonary disease, unspecified: Secondary | ICD-10-CM | POA: Diagnosis not present

## 2015-04-29 DIAGNOSIS — Z7902 Long term (current) use of antithrombotics/antiplatelets: Secondary | ICD-10-CM | POA: Diagnosis not present

## 2015-04-29 DIAGNOSIS — I69351 Hemiplegia and hemiparesis following cerebral infarction affecting right dominant side: Secondary | ICD-10-CM | POA: Diagnosis not present

## 2015-04-29 DIAGNOSIS — K219 Gastro-esophageal reflux disease without esophagitis: Secondary | ICD-10-CM | POA: Diagnosis not present

## 2015-04-29 LAB — BASIC METABOLIC PANEL
ANION GAP: 7 (ref 5–15)
BUN: 12 mg/dL (ref 6–20)
CALCIUM: 7.2 mg/dL — AB (ref 8.9–10.3)
CHLORIDE: 111 mmol/L (ref 101–111)
CO2: 24 mmol/L (ref 22–32)
Creatinine, Ser: 0.67 mg/dL (ref 0.44–1.00)
GFR calc non Af Amer: >60 mL/min (ref >60)
GLUCOSE: 96 mg/dL (ref 65–99)
POTASSIUM: 3.4 mmol/L — AB (ref 3.5–5.1)
Sodium: 142 mmol/L (ref 135–145)

## 2015-04-29 SURGERY — COLONOSCOPY WITH PROPOFOL
Anesthesia: Monitor Anesthesia Care

## 2015-04-29 SURGERY — CANCELLED PROCEDURE

## 2015-04-29 MED ORDER — PROPOFOL 10 MG/ML IV BOLUS
INTRAVENOUS | Status: AC
  Filled 2015-04-29: qty 20

## 2015-04-29 MED ORDER — SODIUM CHLORIDE 0.9 % IV SOLN: INTRAVENOUS | Status: DC

## 2015-04-29 SURGICAL SUPPLY — 21 items

## 2015-04-29 NOTE — Anesthesia Preprocedure Evaluation (Deleted)
Anesthesia Evaluation  Patient identified by MRN, date of birth, ID band Patient awake    Reviewed: Allergy & Precautions, H&P , NPO status , Patient's Chart, lab work & pertinent test results  Airway Mallampati: II  TM Distance: >3 FB Neck ROM: full    Dental no notable dental hx. (+) Dental Advisory Given, Teeth Intact   Pulmonary COPD,  COPD inhaler, former smoker,    Pulmonary exam normal breath sounds clear to auscultation       Cardiovascular + CAD  Normal cardiovascular exam Rhythm:regular Rate:Normal  Coronary artery spasm   Neuro/Psych Depression Aphasia. hemiparesis  Neuromuscular disease CVA, Residual Symptoms negative neurological ROS  negative psych ROS   GI/Hepatic negative GI ROS, Neg liver ROS, GERD  Medicated and Controlled,  Endo/Other  diabetes, Well Controlled, Type 2, Oral Hypoglycemic Agents  Renal/GU negative Renal ROS  negative genitourinary   Musculoskeletal   Abdominal   Peds  Hematology negative hematology ROS (+)   Anesthesia Other Findings   Reproductive/Obstetrics negative OB ROS                             Anesthesia Physical Anesthesia Plan  ASA: III  Anesthesia Plan: MAC   Post-op Pain Management:    Induction:   Airway Management Planned:   Additional Equipment:   Intra-op Plan:   Post-operative Plan:   Informed Consent: I have reviewed the patients History and Physical, chart, labs and discussed the procedure including the risks, benefits and alternatives for the proposed anesthesia with the patient or authorized representative who has indicated his/her understanding and acceptance.   Dental Advisory Given  Plan Discussed with: CRNA and Surgeon  Anesthesia Plan Comments:         Anesthesia Quick Evaluation

## 2015-04-29 NOTE — Care Management Obs Status (Signed)
[place] NOTIFICATION   Patient Details  Name: Brandy Paul MRN: [id number] Date of Birth: 02/21/1946   Medicare Observation Status Notification Given:  Yes    Dellie Catholic, RN 04/29/2015, 2:00 PM

## 2015-04-29 NOTE — Discharge Summary (Signed)
[facility] Discharge Summary  Name: Brandy Paul MRN: [id number] DOB: 1945-06-05 70 y.o. PCP:  Velna Hatchet, MD  Date of Admission: 04/28/2015  1:46 PM Date of Discharge: 04/29/2015 Primary Gastroenterologist:Dr Nandigam Discharging Physician:Dr Nandigam  Discharge Diagnosis: Active Problems:   Iron deficiency anemia   Consultations:none  Procedures Performed: none   GI Procedures: none  History/Physical Exam:  See Admission H&P  Admission HPI: Brandy Paul is a 70 y.o. female With a history of coronary artery spasm, Prior CVA with right-sided hemiparesis, type 2 diabetes with complications, COPD, and breast cancer. She was recently referred to the GI ofice by her PCP to discuss colonoscopy. The patient has a history of a stroke 4 years ago and has residual weakness on the right side. She requires help from her husband at home. Her last colonoscopy was several years ago. She recently had a positive Cologard test by her primary car physician. She also has iron deficiency anemia and has received iron infusions twice in the month of December. She has been on Plavix per her strke , last took this on February 2 in anticipation of her colonoscopy tomorrow. She denies any GI complaints. Her husband reports that he does help her a lot at times with bowel movements and on rare occasion she will have some light pink colored blood on the toilet tissue. Laboratory studies from 02/10/2015 prior to her iron infusions showed a hemoglobin of 9.1, MCV 69.8, serum iron 22 , iron percent sat was 6 and ferritin was 7.5. Folc acid and vitamin B-12 were normal.  Hospital Course by problem list: Active Problems:   Iron deficiency anemia  patient was admitted on 04/28/2015 for assistance with a bowel prep. Patient was scheduled for colonoscopy on 04/29/2015. Unfortunately, despite drinking the entire bowel prep, on the morning of the colonoscopy the patient was still passing formed stools.  Patient was offered the option of doing another bowel prep and having her colonoscopy tomorrow, however unfortunately no endoscopy rooms were available for the procedure and no propofol was available for the procedure. Patient was then offered the option of going home, with plans to readmit on Monday for colonoscopy with Dr. Loletha Carrow on Tuesday. The other option was for patient to go home today and reschedule her procedure for sometime in March when Dr. Silverio Decamp will be back in the hospital. Patient opted to resume her regular diet today and go home and come back in March 2 reschedule. She will be contacted by the GI office. We will plan on admitting her to days prior to the procedure for a 2 day vigorous prep.  Discharge Vitals:  BP 122/52 mmHg  Pulse 64  Temp(Src) 97.9 F (36.6 C) (Oral)  Resp 18  Ht 5\' 3"  (1.6 m)  Wt 205 lb 6.4 oz (93.169 kg)  BMI 36.39 kg/m2  SpO2 97%  Physical Exam alert and oriented 3 General: Alert and oriented 3, no acute distress Cardiac: Regular rate and rhythm S1 and S2 Pulmonary: Lungs clear to auscultation Abdominal: Soft nontender positive bowel sounds Extremity: No cyanosis clubbing or edema Neuro: Alert and oriented 3 Psych: Normal affect  Discharge Labs:  Results for orders placed or performed during the hospital encounter of 04/28/15 (from the past 24 hour(s))  Comprehensive metabolic panel     Status: Abnormal   Collection Time: 04/28/15  6:11 PM  Result Value Ref Range   Sodium 138 135 - 145 mmol/L   Potassium 4.1 3.5 - 5.1 mmol/L   Chloride  103 101 - 111 mmol/L   CO2 28 22 - 32 mmol/L   Glucose, Bld 86 65 - 99 mg/dL   BUN 15 6 - 20 mg/dL   Creatinine, Ser 0.79 0.44 - 1.00 mg/dL   Calcium 8.6 (L) 8.9 - 10.3 mg/dL   Total Protein 6.4 (L) 6.5 - 8.1 g/dL   Albumin 3.4 (L) 3.5 - 5.0 g/dL   AST 24 15 - 41 U/L   ALT 16 14 - 54 U/L   Alkaline Phosphatase 78 38 - 126 U/L   Total Bilirubin 0.4 0.3 - 1.2 mg/dL   GFR calc non Af Amer >60 >60 mL/min    GFR calc Af Amer >60 >60 mL/min   Anion gap 7 5 - 15  CBC     Status: Abnormal   Collection Time: 04/28/15  6:11 PM  Result Value Ref Range   WBC 6.1 4.0 - 10.5 K/uL   RBC 4.71 3.87 - 5.11 MIL/uL   Hemoglobin 12.4 12.0 - 15.0 g/dL   HCT 38.7 36.0 - 46.0 %   MCV 82.2 78.0 - 100.0 fL   MCH 26.3 26.0 - 34.0 pg   MCHC 32.0 30.0 - 36.0 g/dL   RDW 24.1 (H) 11.5 - 15.5 %   Platelets 147 (L) 150 - 400 K/uL  Protime-INR     Status: None   Collection Time: 04/28/15  6:11 PM  Result Value Ref Range   Prothrombin Time 13.4 11.6 - 15.2 seconds   INR 1.04 0.00 - 99991111  Basic metabolic panel     Status: Abnormal   Collection Time: 04/29/15  7:17 AM  Result Value Ref Range   Sodium 142 135 - 145 mmol/L   Potassium 3.4 (L) 3.5 - 5.1 mmol/L   Chloride 111 101 - 111 mmol/L   CO2 24 22 - 32 mmol/L   Glucose, Bld 96 65 - 99 mg/dL   BUN 12 6 - 20 mg/dL   Creatinine, Ser 0.67 0.44 - 1.00 mg/dL   Calcium 7.2 (L) 8.9 - 10.3 mg/dL   GFR calc non Af Amer >60 >60 mL/min   GFR calc Af Amer >60 >60 mL/min   Anion gap 7 5 - 15    Disposition and follow-up:   [name] was discharged from [facility] in stable condition.    Follow-up Appointments: Discharge Instructions    Diet - low sodium heart healthy    Complete by:  As directed      Increase activity slowly    Complete by:  As directed            Discharge Medications:   Medication List    STOP taking these medications        acetaminophen 500 MG tablet  Commonly known as:  TYLENOL      TAKE these medications        aspirin 81 MG tablet  Take 81 mg by mouth daily.     atorvastatin 20 MG tablet  Commonly known as:  LIPITOR  Take 20 mg by mouth daily.     clopidogrel 75 MG tablet  Commonly known as:  PLAVIX  Take 75 mg by mouth daily.     diphenhydrAMINE 25 MG tablet  Commonly known as:  SOMINEX  Take 25 mg by mouth daily as needed for allergies or sleep.     ezetimibe 10 MG tablet  Commonly known as:   ZETIA  Take 10 mg by mouth daily.  isosorbide mononitrate 60 MG 24 hr tablet  Commonly known as:  IMDUR  Take 1 tablet (60 mg total) by mouth daily.     MULTIVITAMIN PO  Take 1 tablet by mouth daily.     nitroGLYCERIN 0.4 MG SL tablet  Commonly known as:  NITROSTAT  Place 1 tablet (0.4 mg total) under the tongue every 5 (five) minutes as needed.     OSCAL 500/200 D-3 PO  Take 1 tablet by mouth daily.     RABEprazole 20 MG tablet  Commonly known as:  ACIPHEX  Take 20 mg by mouth daily.     sertraline 25 MG tablet  Commonly known as:  ZOLOFT  Take 50 mg by mouth daily.     SPIRIVA HANDIHALER 18 MCG inhalation capsule  Generic drug:  tiotropium  Place 18 mcg into inhaler and inhale daily as needed (DIFFICULTY BREATHING).        Signed: Ernesteen Mihalic, Vita Barley PA-C 04/29/2015, 9:55 AM

## 2015-04-30 SURGERY — COLONOSCOPY
Anesthesia: General

## 2015-07-20 ENCOUNTER — Encounter (HOSPITAL_COMMUNITY): Payer: Self-pay | Admitting: Gastroenterology

## 2015-08-20 DIAGNOSIS — E784 Other hyperlipidemia: Secondary | ICD-10-CM | POA: Diagnosis not present

## 2015-08-20 DIAGNOSIS — E119 Type 2 diabetes mellitus without complications: Secondary | ICD-10-CM | POA: Diagnosis not present

## 2015-08-20 DIAGNOSIS — Z1389 Encounter for screening for other disorder: Secondary | ICD-10-CM | POA: Diagnosis not present

## 2015-08-20 DIAGNOSIS — I69961 Other paralytic syndrome following unspecified cerebrovascular disease affecting right dominant side: Secondary | ICD-10-CM | POA: Diagnosis not present

## 2015-08-20 DIAGNOSIS — Z6837 Body mass index (BMI) 37.0-37.9, adult: Secondary | ICD-10-CM | POA: Diagnosis not present

## 2015-08-20 DIAGNOSIS — F325 Major depressive disorder, single episode, in full remission: Secondary | ICD-10-CM | POA: Diagnosis not present

## 2015-08-20 DIAGNOSIS — D229 Melanocytic nevi, unspecified: Secondary | ICD-10-CM | POA: Diagnosis not present

## 2015-08-20 DIAGNOSIS — J449 Chronic obstructive pulmonary disease, unspecified: Secondary | ICD-10-CM | POA: Diagnosis not present

## 2015-08-22 ENCOUNTER — Emergency Department (HOSPITAL_COMMUNITY)
Admission: EM | Admit: 2015-08-22 | Discharge: 2015-08-22 | Disposition: A | Discharge status: Home / Self Care | Payer: Medicare Other | Attending: Emergency Medicine | Admitting: Emergency Medicine

## 2015-08-22 ENCOUNTER — Encounter (HOSPITAL_COMMUNITY): Payer: Self-pay | Admitting: Nurse Practitioner

## 2015-08-22 ENCOUNTER — Emergency Department (HOSPITAL_COMMUNITY): Payer: Medicare Other

## 2015-08-22 DIAGNOSIS — N83202 Unspecified ovarian cyst, left side: Secondary | ICD-10-CM | POA: Diagnosis not present

## 2015-08-22 DIAGNOSIS — F329 Major depressive disorder, single episode, unspecified: Secondary | ICD-10-CM | POA: Insufficient documentation

## 2015-08-22 DIAGNOSIS — R1031 Right lower quadrant pain: Secondary | ICD-10-CM | POA: Diagnosis not present

## 2015-08-22 DIAGNOSIS — Z87891 Personal history of nicotine dependence: Secondary | ICD-10-CM | POA: Insufficient documentation

## 2015-08-22 DIAGNOSIS — Z7982 Long term (current) use of aspirin: Secondary | ICD-10-CM | POA: Insufficient documentation

## 2015-08-22 DIAGNOSIS — E785 Hyperlipidemia, unspecified: Secondary | ICD-10-CM | POA: Diagnosis not present

## 2015-08-22 DIAGNOSIS — E119 Type 2 diabetes mellitus without complications: Secondary | ICD-10-CM | POA: Insufficient documentation

## 2015-08-22 DIAGNOSIS — R1013 Epigastric pain: Secondary | ICD-10-CM | POA: Diagnosis not present

## 2015-08-22 DIAGNOSIS — Z853 Personal history of malignant neoplasm of breast: Secondary | ICD-10-CM | POA: Insufficient documentation

## 2015-08-22 DIAGNOSIS — J449 Chronic obstructive pulmonary disease, unspecified: Secondary | ICD-10-CM | POA: Insufficient documentation

## 2015-08-22 DIAGNOSIS — Z79899 Other long term (current) drug therapy: Secondary | ICD-10-CM | POA: Diagnosis not present

## 2015-08-22 DIAGNOSIS — N949 Unspecified condition associated with female genital organs and menstrual cycle: Secondary | ICD-10-CM

## 2015-08-22 DIAGNOSIS — K5730 Diverticulosis of large intestine without perforation or abscess without bleeding: Secondary | ICD-10-CM | POA: Diagnosis not present

## 2015-08-22 DIAGNOSIS — R0602 Shortness of breath: Secondary | ICD-10-CM | POA: Diagnosis not present

## 2015-08-22 DIAGNOSIS — N858 Other specified noninflammatory disorders of uterus: Secondary | ICD-10-CM | POA: Diagnosis not present

## 2015-08-22 DIAGNOSIS — R109 Unspecified abdominal pain: Secondary | ICD-10-CM

## 2015-08-22 DIAGNOSIS — Z8673 Personal history of transient ischemic attack (TIA), and cerebral infarction without residual deficits: Secondary | ICD-10-CM | POA: Insufficient documentation

## 2015-08-22 LAB — CBC
HEMATOCRIT: 39.8 % (ref 36.0–46.0)
HEMOGLOBIN: 11.8 g/dL — AB (ref 12.0–15.0)
MCH: 25.3 pg — AB (ref 26.0–34.0)
MCHC: 29.6 g/dL — AB (ref 30.0–36.0)
MCV: 85.2 fL (ref 78.0–100.0)
Platelets: 198 10*3/uL (ref 150–400)
RBC: 4.67 MIL/uL (ref 3.87–5.11)
RDW: 16.1 % — AB (ref 11.5–15.5)
WBC: 6.7 10*3/uL (ref 4.0–10.5)

## 2015-08-22 LAB — URINALYSIS, ROUTINE W REFLEX MICROSCOPIC
BILIRUBIN URINE: NEGATIVE
GLUCOSE, UA: NEGATIVE mg/dL
HGB URINE DIPSTICK: NEGATIVE
Ketones, ur: NEGATIVE mg/dL
Nitrite: NEGATIVE
PH: 6 (ref 5.0–8.0)
Protein, ur: NEGATIVE mg/dL
SPECIFIC GRAVITY, URINE: 1.023 (ref 1.005–1.030)

## 2015-08-22 LAB — BASIC METABOLIC PANEL
Anion gap: 5 (ref 5–15)
BUN: 16 mg/dL (ref 6–20)
CALCIUM: 8.8 mg/dL — AB (ref 8.9–10.3)
CHLORIDE: 105 mmol/L (ref 101–111)
CO2: 29 mmol/L (ref 22–32)
CREATININE: 0.79 mg/dL (ref 0.44–1.00)
GFR calc Af Amer: >60 mL/min (ref >60)
GFR calc non Af Amer: >60 mL/min (ref >60)
Glucose, Bld: 131 mg/dL — ABNORMAL HIGH (ref 65–99)
Potassium: 3.9 mmol/L (ref 3.5–5.1)
SODIUM: 139 mmol/L (ref 135–145)

## 2015-08-22 LAB — HEPATIC FUNCTION PANEL
ALBUMIN: 3.4 g/dL — AB (ref 3.5–5.0)
ALK PHOS: 83 U/L (ref 38–126)
ALT: 14 U/L (ref 14–54)
AST: 18 U/L (ref 15–41)
BILIRUBIN TOTAL: 0.5 mg/dL (ref 0.3–1.2)
Total Protein: 6.3 g/dL — ABNORMAL LOW (ref 6.5–8.1)

## 2015-08-22 LAB — URINE MICROSCOPIC-ADD ON

## 2015-08-22 LAB — LIPASE, BLOOD: Lipase: 37 U/L (ref 11–51)

## 2015-08-22 LAB — I-STAT TROPONIN, ED: Troponin i, poc: 0 ng/mL (ref 0.00–0.08)

## 2015-08-22 MED ORDER — SODIUM CHLORIDE 0.9 % IV BOLUS (SEPSIS)
500.0000 mL | Freq: Once | INTRAVENOUS | Status: AC
  Administered 2015-08-22: 500 mL via INTRAVENOUS

## 2015-08-22 MED ORDER — IOPAMIDOL (ISOVUE-300) INJECTION 61%
INTRAVENOUS | Status: AC
  Administered 2015-08-22: 100 mL
  Filled 2015-08-22: qty 100

## 2015-08-22 MED ORDER — FENTANYL CITRATE (PF) 100 MCG/2ML IJ SOLN
50.0000 ug | INTRAMUSCULAR | Status: DC | PRN
  Administered 2015-08-22: 50 ug via INTRAVENOUS
  Filled 2015-08-22: qty 2

## 2015-08-22 NOTE — Discharge Instructions (Signed)
Take Tylenol as needed for pain. Return to see a physician if you develop worsening pain, fevers, blood in the stool or persistent diarrhea.  If you were given medicines take as directed.  If you are on coumadin or contraceptives realize their levels and effectiveness is altered by many different medicines.  If you have any reaction (rash, tongues swelling, other) to the medicines stop taking and see a physician.    If your blood pressure was elevated in the ER make sure you follow up for management with a primary doctor or return for chest pain, shortness of breath or stroke symptoms.  Please follow up as directed and return to the ER or see a physician for new or worsening symptoms.  Thank you. Filed Vitals:   08/22/15 1635 08/22/15 1830 08/22/15 1907 08/22/15 2030  BP: 160/83 179/76 170/80 148/74  Pulse: 66 67 63 76  Temp: 98 F (36.7 C)     TempSrc: Oral     Resp: 20 18 17 17   Height: 5\' 3"  (1.6 m)     Weight: 207 lb (93.895 kg)     SpO2: 98% 98% 98% 98%

## 2015-08-22 NOTE — ED Notes (Signed)
Called main lab to add on hepatic function and lipase.

## 2015-08-22 NOTE — ED Provider Notes (Signed)
CSN: [id number]     Arrival date & time 08/22/15  1630 History   First MD Initiated Contact with Patient 08/22/15 1753     Chief Complaint  Patient presents with  . Chest Pain     (Consider location/radiation/quality/duration/timing/severity/associated sxs/prior Treatment) HPI Comments: 70 year old female with history of coronary artery spasm, stroke, diabetes, reflux presents with epigastric lower chest pain and right-sided abdominal pain. This afternoon patient is resting on the couch no recent meals started having mild cough and gas-like symptoms. Patient drinks soda took nitroglycerin without change in the pain. Patient's pain localized to the right abdomen. No history of similar. Patient passing gas. No vomiting or fevers. No current chest pain. No heart attack history no blood clot history, no recent surgeries. No recent leg swelling. Pain nonradiating.  Patient is a 70 y.o. female presenting with chest pain. The history is provided by the patient.  Chest Pain Associated symptoms: abdominal pain and cough   Associated symptoms: no back pain, no fever, no headache, no shortness of breath and not vomiting     Past Medical History  Diagnosis Date  . COPD (chronic obstructive pulmonary disease) (HCC)     hx of tobacco use  . Coronary artery spasm ([place])   . IDA (iron deficiency anemia)   . GERD (gastroesophageal reflux disease)   . Depression   . Hyperlipemia   . Osteoporosis   . Toe fracture, left   . Stroke [facility]) 2013    paralytic syndrome of dominant left side  . Diabetes mellitus     hx of prediabetes priot to cva  . Breast cancer ([place]) 2005    left  . 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 Radial keratotomy Bilateral   . Tonsillectomy    . Abdominal hysterectomy      complete  . Mastectomy Left    Family History  Problem Relation Age of Onset  . CVA Mother   . Heart attack Sister   . Stroke Maternal  Grandfather   . Hypertension Sister   . Liver cancer Mother   . Diabetes Sister   . Arthritis Sister    Social History  Substance Use Topics  . Smoking status: Former Smoker    Types: Cigarettes    Quit date: 02/17/1994  . Smokeless tobacco: Never Used  . Alcohol Use: No   OB History    No data available     Review of Systems  Constitutional: Positive for appetite change. Negative for fever and chills.  HENT: Negative for congestion.   Eyes: Negative for visual disturbance.  Respiratory: Positive for cough. Negative for shortness of breath.   Cardiovascular: Positive for chest pain.  Gastrointestinal: Positive for abdominal pain. Negative for vomiting.  Genitourinary: Negative for dysuria and flank pain.  Musculoskeletal: Negative for back pain, neck pain and neck stiffness.  Skin: Negative for rash.  Neurological: Negative for light-headedness and headaches.      Allergies  Ceclor  Home Medications   Prior to Admission medications   Medication Sig Start Date End Date Taking? Authorizing Provider  aspirin 81 MG tablet Take 81 mg by mouth daily.     Yes Historical Provider, MD  atorvastatin (LIPITOR) 20 MG tablet Take 20 mg by mouth daily. 10/31/14  Yes Historical Provider, MD  Calcium Carbonate-Vitamin D (OSCAL 500/200 D-3 PO) Take 1 tablet by mouth daily.    Yes Historical Provider, MD  clopidogrel (PLAVIX) 75  MG tablet Take 75 mg by mouth daily.  10/02/13  Yes Historical Provider, MD  diphenhydrAMINE (SOMINEX) 25 MG tablet Take 25 mg by mouth daily as needed for allergies.    Yes Historical Provider, MD  ezetimibe (ZETIA) 10 MG tablet Take 10 mg by mouth daily.   Yes Historical Provider, MD  isosorbide mononitrate (IMDUR) 60 MG 24 hr tablet Take 1 tablet (60 mg total) by mouth daily. 12/25/14  Yes Belva Crome, MD  Multiple Vitamins-Minerals (MULTIVITAMIN PO) Take 1 tablet by mouth daily.    Yes Historical Provider, MD  nitroGLYCERIN (NITROSTAT) 0.4 MG SL tablet Place  1 tablet (0.4 mg total) under the tongue every 5 (five) minutes as needed. Patient taking differently: Place 0.4 mg under the tongue every 5 (five) minutes as needed for chest pain.  11/24/14  Yes Belva Crome, MD  RABEprazole (ACIPHEX) 20 MG tablet Take 20 mg by mouth daily. 10/29/14  Yes Historical Provider, MD  sertraline (ZOLOFT) 25 MG tablet Take 50 mg by mouth daily.  11/17/14  Yes Historical Provider, MD  tiotropium (SPIRIVA HANDIHALER) 18 MCG inhalation capsule Place 18 mcg into inhaler and inhale daily as needed (DIFFICULTY BREATHING).    Yes Historical Provider, MD   BP 148/74 mmHg  Pulse 76  Temp(Src) 98 F (36.7 C) (Oral)  Resp 17  Ht 5\' 3"  (1.6 m)  Wt 207 lb (93.895 kg)  BMI 36.68 kg/m2  SpO2 98% Physical Exam  Constitutional: She is oriented to person, place, and time. She appears well-developed and well-nourished.  HENT:  Head: Normocephalic and atraumatic.  Eyes: Conjunctivae are normal. Right eye exhibits no discharge. Left eye exhibits no discharge.  Neck: Normal range of motion. Neck supple. No tracheal deviation present.  Cardiovascular: Normal rate and regular rhythm.   Pulmonary/Chest: Effort normal and breath sounds normal.  Abdominal: Soft. She exhibits no distension. There is tenderness (right mid and lower abdomen mild). There is no guarding.  Musculoskeletal: She exhibits no edema.  Neurological: She is alert and oriented to person, place, and time.  Skin: Skin is warm. No rash noted.  Psychiatric: She has a normal mood and affect.  Nursing note and vitals reviewed.   ED Course  Procedures (including critical care time) Labs Review Labs Reviewed  BASIC METABOLIC PANEL - Abnormal; Notable for the following:    Glucose, Bld 131 (*)    Calcium 8.8 (*)    All other components within normal limits  CBC - Abnormal; Notable for the following:    Hemoglobin 11.8 (*)    MCH 25.3 (*)    MCHC 29.6 (*)    RDW 16.1 (*)    All other components within normal  limits  HEPATIC FUNCTION PANEL - Abnormal; Notable for the following:    Total Protein 6.3 (*)    Albumin 3.4 (*)    Bilirubin, Direct <0.1 (*)    All other components within normal limits  LIPASE, BLOOD  URINALYSIS, ROUTINE W REFLEX MICROSCOPIC (NOT AT [facility])  Randolm Idol, ED    Imaging Review Dg Chest 2 View  08/22/2015  CLINICAL DATA:  Chest pain with shortness of breath and cough for 3 hours. History of myocardial infarction, stroke and COPD. EXAM: CHEST  2 VIEW COMPARISON:  None. FINDINGS: There is a moderate size hiatal hernia. The heart size and mediastinal contours are otherwise normal. The lungs are clear. There are surgical clips in the left axilla status post left mastectomy. No acute osseous findings are seen.  IMPRESSION: No evidence of active cardiopulmonary process. Postsurgical changes and hiatal hernia as described. Electronically Signed   By: Richardean Sale M.D.   On: 08/22/2015 18:19   Ct Abdomen Pelvis W Contrast  08/22/2015  CLINICAL DATA:  70 year old female with right abdominal pain for 1 day. Initial encounter. EXAM: CT ABDOMEN AND PELVIS WITH CONTRAST TECHNIQUE: Multidetector CT imaging of the abdomen and pelvis was performed using the standard protocol following bolus administration of intravenous contrast. CONTRAST:  159mL ISOVUE-300 IOPAMIDOL (ISOVUE-300) INJECTION 61% COMPARISON:  Chest radiographs 1756 hours today. FINDINGS: Partially visible postoperative changes to the left chest wall including breast implant. Moderate-sized gastric hiatal hernia. Negative lung bases aside from mild dependent atelectasis. No pericardial or pleural effusion. No acute osseous abnormality identified. The uterus is surgically absent. There is a lobulated 3.1 x 4.8 x 3.9 cm cystic lesion of the left adnexa projecting into the central pelvis. This has simple fluid densitometry. Negative right ovary. Diminutive urinary bladder. Negative rectum. Moderate to severe diverticulosis of the  sigmoid colon and left colon with suggestion of mild sigmoid wall thickening but otherwise no active inflammation. No mesenteric stranding. Occasional diverticula in the transverse colon which otherwise is negative. Negative right colon and appendix. Negative terminal ileum. No dilated small bowel. The intra abdominal portion of the stomach appears normal. Negative duodenum. No abdominal free air or free fluid. Liver, gallbladder, spleen, pancreas and adrenal glands are within normal limits. Portal venous system is patent. Aortoiliac calcified atherosclerosis noted. Major arterial structures of the abdomen and pelvis are patent there is some renal cortical scarring, worse on the left. Renal enhancement and contrast excretion however appears symmetric. No lymphadenopathy. No abdominal hernia identified. IMPRESSION: 1. Diverticulosis of the sigmoid colon and left colon. Difficult to exclude mild sigmoid diverticulitis. 2. Otherwise no acute or inflammatory process identified in the abdomen or pelvis. 3. Cystic 4.8 cm left adnexal lesion. Benign etiology is favored but nonemergent follow-up Pelvic Ultrasound is indicated as per the ACR consensus guidelines: White Paper of the ACR Incidental Findings Committee II on Adnexal Findings. J Am Coll Radiol [phone]. Transabdominal and transvaginal ultrasound of the pelvis would be preferred. 4. Calcified aortic atherosclerosis. Moderate size gastric hiatal hernia. Mild degree of chronic renal scarring. Electronically Signed   By: Genevie Ann M.D.   On: 08/22/2015 20:36   I have personally reviewed and evaluated these images and lab results as part of my medical decision-making.   EKG Interpretation   Date/Time:  Sunday August 22 2015 16:35:15 EDT Ventricular Rate:  68 PR Interval:  154 QRS Duration: 98 QT Interval:  402 QTC Calculation: 427 R Axis:   12 Text Interpretation:  Normal sinus rhythm Normal ECG Confirmed by Romari Gasparro  MD, Vonna Kotyk [phone]) on 08/22/2015  5:59:08 PM      MDM   Final diagnoses:  Right sided abdominal pain  Epigastric pain  Diverticulosis of large intestine without hemorrhage  Adnexal cyst   Patient presents with epigastric discomfort localized to right abdomen. No known gallbladder or appendix history. Plan for cardiac screen and CT scan abdomen pelvis to look for further details of pain.  Patient pain resolved/improved on reassessment. Blood work unremarkable. CT scan showed cyst that needs outpatient follow-up. Showed diverticulosis without obvious diverticulitis. No fevers no diarrhea. Patient stable for follow-up with primary doctor this week.  If you were given medicines take as directed.  If you are on coumadin or contraceptives realize their levels and effectiveness is altered by many different medicines.  If you have any reaction (rash, tongues swelling, other) to the medicines stop taking and see a physician.    If your blood pressure was elevated in the ER make sure you follow up for management with a primary doctor or return for chest pain, shortness of breath or stroke symptoms.  Please follow up as directed and return to the ER or see a physician for new or worsening symptoms.  Thank you. Filed Vitals:   08/22/15 1635 08/22/15 1830 08/22/15 1907 08/22/15 2030  BP: 160/83 179/76 170/80 148/74  Pulse: 66 67 63 76  Temp: 98 F (36.7 C)     TempSrc: Oral     Resp: 20 18 17 17   Height: 5\' 3"  (1.6 m)     Weight: 207 lb (93.895 kg)     SpO2: 98% 98% 98% 98%       Elnora Morrison, MD 08/22/15 2113

## 2015-08-22 NOTE — ED Notes (Signed)
E-sig not working, pt verbalizes understanding of DC instructions and follow up care. Pt leaving with SO.

## 2015-08-22 NOTE — ED Notes (Signed)
Pt c/o abd and chest pain onset this afternoon while resting on the couch. She reports SOB, cough, dizziness. Denies nausea, diaphoresis. She drank some soda and took 1 nitro with no change in the pain. She is alert and breathing easily

## 2015-08-24 DIAGNOSIS — Z6837 Body mass index (BMI) 37.0-37.9, adult: Secondary | ICD-10-CM | POA: Diagnosis not present

## 2015-08-24 DIAGNOSIS — K5730 Diverticulosis of large intestine without perforation or abscess without bleeding: Secondary | ICD-10-CM | POA: Diagnosis not present

## 2015-08-24 DIAGNOSIS — R1909 Other intra-abdominal and pelvic swelling, mass and lump: Secondary | ICD-10-CM | POA: Diagnosis not present

## 2015-08-26 ENCOUNTER — Other Ambulatory Visit: Payer: Self-pay | Admitting: Internal Medicine

## 2015-08-26 DIAGNOSIS — N9489 Other specified conditions associated with female genital organs and menstrual cycle: Secondary | ICD-10-CM

## 2015-08-30 ENCOUNTER — Ambulatory Visit
Admission: RE | Admit: 2015-08-30 | Discharge: 2015-08-30 | Disposition: A | Discharge status: Home / Self Care | Payer: Medicare Other | Source: Ambulatory Visit | Attending: Internal Medicine | Admitting: Internal Medicine

## 2015-08-30 DIAGNOSIS — N838 Other noninflammatory disorders of ovary, fallopian tube and broad ligament: Secondary | ICD-10-CM | POA: Diagnosis not present

## 2015-08-30 DIAGNOSIS — N9489 Other specified conditions associated with female genital organs and menstrual cycle: Secondary | ICD-10-CM

## 2015-09-07 DIAGNOSIS — L820 Inflamed seborrheic keratosis: Secondary | ICD-10-CM | POA: Diagnosis not present

## 2015-09-07 DIAGNOSIS — L821 Other seborrheic keratosis: Secondary | ICD-10-CM | POA: Diagnosis not present

## 2015-09-07 DIAGNOSIS — L304 Erythema intertrigo: Secondary | ICD-10-CM | POA: Diagnosis not present

## 2015-09-07 DIAGNOSIS — L219 Seborrheic dermatitis, unspecified: Secondary | ICD-10-CM | POA: Diagnosis not present

## 2015-09-30 ENCOUNTER — Other Ambulatory Visit: Payer: Self-pay | Admitting: Adult Health

## 2015-10-08 ENCOUNTER — Ambulatory Visit: Payer: Medicare Other | Attending: Gynecology | Admitting: Gynecology

## 2015-10-08 ENCOUNTER — Encounter: Payer: Self-pay | Admitting: Gynecology

## 2015-10-08 VITALS — BP 125/58 | HR 66 | Temp 98.3°F | Resp 18 | Ht 63.0 in | Wt 206.4 lb

## 2015-10-08 DIAGNOSIS — C50019 Malignant neoplasm of nipple and areola, unspecified female breast: Secondary | ICD-10-CM | POA: Insufficient documentation

## 2015-10-08 DIAGNOSIS — Z90710 Acquired absence of both cervix and uterus: Secondary | ICD-10-CM | POA: Insufficient documentation

## 2015-10-08 DIAGNOSIS — E785 Hyperlipidemia, unspecified: Secondary | ICD-10-CM | POA: Insufficient documentation

## 2015-10-08 DIAGNOSIS — Z8673 Personal history of transient ischemic attack (TIA), and cerebral infarction without residual deficits: Secondary | ICD-10-CM | POA: Insufficient documentation

## 2015-10-08 DIAGNOSIS — E119 Type 2 diabetes mellitus without complications: Secondary | ICD-10-CM | POA: Diagnosis not present

## 2015-10-08 DIAGNOSIS — D509 Iron deficiency anemia, unspecified: Secondary | ICD-10-CM | POA: Diagnosis not present

## 2015-10-08 DIAGNOSIS — Z888 Allergy status to other drugs, medicaments and biological substances status: Secondary | ICD-10-CM | POA: Insufficient documentation

## 2015-10-08 DIAGNOSIS — G8389 Other specified paralytic syndromes: Secondary | ICD-10-CM | POA: Insufficient documentation

## 2015-10-08 DIAGNOSIS — D4959 Neoplasm of unspecified behavior of other genitourinary organ: Secondary | ICD-10-CM | POA: Diagnosis not present

## 2015-10-08 DIAGNOSIS — J449 Chronic obstructive pulmonary disease, unspecified: Secondary | ICD-10-CM | POA: Diagnosis not present

## 2015-10-08 DIAGNOSIS — Z8249 Family history of ischemic heart disease and other diseases of the circulatory system: Secondary | ICD-10-CM | POA: Diagnosis not present

## 2015-10-08 DIAGNOSIS — Z808 Family history of malignant neoplasm of other organs or systems: Secondary | ICD-10-CM | POA: Diagnosis not present

## 2015-10-08 DIAGNOSIS — I201 Angina pectoris with documented spasm: Secondary | ICD-10-CM | POA: Insufficient documentation

## 2015-10-08 DIAGNOSIS — M810 Age-related osteoporosis without current pathological fracture: Secondary | ICD-10-CM | POA: Diagnosis not present

## 2015-10-08 DIAGNOSIS — Z853 Personal history of malignant neoplasm of breast: Secondary | ICD-10-CM | POA: Insufficient documentation

## 2015-10-08 DIAGNOSIS — Z9012 Acquired absence of left breast and nipple: Secondary | ICD-10-CM | POA: Insufficient documentation

## 2015-10-08 DIAGNOSIS — Z87891 Personal history of nicotine dependence: Secondary | ICD-10-CM | POA: Insufficient documentation

## 2015-10-08 DIAGNOSIS — Z823 Family history of stroke: Secondary | ICD-10-CM | POA: Insufficient documentation

## 2015-10-08 DIAGNOSIS — Z7982 Long term (current) use of aspirin: Secondary | ICD-10-CM | POA: Insufficient documentation

## 2015-10-08 DIAGNOSIS — K219 Gastro-esophageal reflux disease without esophagitis: Secondary | ICD-10-CM | POA: Diagnosis not present

## 2015-10-08 DIAGNOSIS — Z8261 Family history of arthritis: Secondary | ICD-10-CM | POA: Insufficient documentation

## 2015-10-08 DIAGNOSIS — F329 Major depressive disorder, single episode, unspecified: Secondary | ICD-10-CM | POA: Diagnosis not present

## 2015-10-08 NOTE — Patient Instructions (Signed)
Plan to follow up with your cardiologist Dr Loletta Specter and your PCP Dr Ardeth Perfect for medical clearance for surgery with Dr Everitt Amber as discussed during your consultation today with Dr Fermin Schwab. We will arrange an appointment with anaesthesia after your visit with Dr Everitt Amber . Call our office [phone] after your have completed your appointments for medical clearance to schedule your follow up with Dr Everitt Amber. Thank you !

## 2015-10-08 NOTE — Progress Notes (Signed)
Consult Note: Gyn-Onc   Brandy Paul 70 y.o. female  Chief Complaint  Patient presents with  . Ovarian Tumor    New Consultation    Assessment :  6.4 x 5.2 x 3.8 cm complex left adnexal mass. There are some areas of concern including moderately thickened internal septations, nodular peripheral wall thickening and an irregular peripheral nodule. On the other hand there is no internal blood flow and no free fluid. Ideally I would recommend laparoscopic removal of the left adnexa. However, given the patient's significant medical comorbidities, I would recommend reconsultation with her cardiologist and internist before scheduling surgery. If it were deemed that surgery was too risky, I recommend serial follow-up with pelvic ultrasounds to monitor the mass.  Plan: We will the patient be reevaluated by her cardiologist and internist regarding surgical clearance. If it is deemed that surgery is reasonable then I would like the patient to be seen by Dr. Denman George and schedule surgery with a minimally invasive approach if possible.  HPI: 64-year-old white married female gravida 4 para 2 seen in consultation at the request of Dr.Scott Holwerda regarding management of a newly diagnosed left adnexal mass. The patient initially presented to the emergency department with epigastric pain. CT scan was obtained that demonstrated diverticulosis of the sigmoid and left colon but no evidence of abscess or acute or inflammatory processes. An incidental 4.8 cm left adnexal mass was also seen. Further evaluation with an ultrasound showed the left ovary measures 6.4 x 5.2 x 3.8 cm. There are some areas of concern including moderately thickened internal septations, nodular peripheral wall thickening and an irregular peripheral nodule. On the other hand there is no internal blood flow and no free fluid.  The patient denies any past gynecologic history. She denies any family history of breast or ovarian cancer.  On the other  hand, patient has a number of medical problems including coronary artery spasm, a right CVA (2013). The patient has functional deficit on the right side and uses a cane. Diet-controlled diabetes and COPD associated with history of tobacco abuse. The patient comes accompanied by her husband.  Review of Systems:10 point review of systems is negative except as noted in interval history.   Vitals: Blood pressure 125/58, pulse 66, temperature 98.3 F (36.8 C), temperature source Oral, resp. rate 18, height 5\' 3"  (1.6 m), weight 206 lb 6.4 oz (93.622 kg), SpO2 97 %.  Physical Exam: General : The patient is in no acute distress.  HEENT: normocephalic, extraoccular movements normal; neck is supple without thyromegally  Lynphnodes: Supraclavicular and inguinal nodes not enlarged  Abdomen: Obese, Soft, non-tender, no ascites, no organomegally, no masses, no hernias  Pelvic:  EGBUS: Normal female  Vagina: Normal, no lesions  Urethra and Bladder: Normal, non-tender  Cervix: Atrophic. No lesions are seen. Uterus: I cannot palpate the uterus secondary to the patient's habitus. Bi-manual examination: Non-tender; no adenxal masses or nodularity  Rectal: normal sphincter tone, no masses, no blood  Lower extremities: No edema or varicosities. Normal range of motion      Allergies  Allergen Reactions  . Ceclor [Cefaclor] Other (See Comments)    REACTION: "SEVERE HEADACHE"    Past Medical History  Diagnosis Date  . COPD (chronic obstructive pulmonary disease) (HCC)     hx of tobacco use  . Coronary artery spasm ([employer])   . IDA (iron deficiency anemia)   . GERD (gastroesophageal reflux disease)   . Depression   . Hyperlipemia   . Osteoporosis   .  Toe fracture, left   . Stroke [facility]) 2013    paralytic syndrome of dominant left side  . Diabetes mellitus     hx of prediabetes priot to cva  . Breast cancer ([place]) 2005    left  . 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 Radial keratotomy Bilateral   . Tonsillectomy    . Abdominal hysterectomy      complete  . Mastectomy Left     Current Outpatient Prescriptions  Medication Sig Dispense Refill  . aspirin 81 MG tablet Take 81 mg by mouth daily.      Marland Kitchen atorvastatin (LIPITOR) 20 MG tablet Take 20 mg by mouth daily.  4  . Calcium Carbonate-Vitamin D (OSCAL 500/200 D-3 PO) Take 1 tablet by mouth daily.     . clopidogrel (PLAVIX) 75 MG tablet Take 75 mg by mouth daily.     Marland Kitchen ezetimibe (ZETIA) 10 MG tablet Take 10 mg by mouth daily.    . isosorbide mononitrate (IMDUR) 60 MG 24 hr tablet TAKE 1 TABLET BY MOUTH EVERY DAY 90 tablet 0  . ketoconazole (NIZORAL) 2 % cream APP ON THE SKIN BID PRF ABDOMINAL RASH  2  . Multiple Vitamins-Minerals (MULTIVITAMIN PO) Take 1 tablet by mouth daily.     . RABEprazole (ACIPHEX) 20 MG tablet Take 20 mg by mouth daily.  2  . sertraline (ZOLOFT) 25 MG tablet Take 50 mg by mouth daily.   3  . tiotropium (SPIRIVA HANDIHALER) 18 MCG inhalation capsule Place 18 mcg into inhaler and inhale daily as needed (DIFFICULTY BREATHING).     . nitroGLYCERIN (NITROSTAT) 0.4 MG SL tablet Place 1 tablet (0.4 mg total) under the tongue every 5 (five) minutes as needed. (Patient not taking: Reported on 10/08/2015) 25 tablet 3   No current facility-administered medications for this visit.    Social History   Social History  . Marital Status: Married    Spouse Name: N/A  . Number of Children: 2  . Years of Education: N/A   Occupational History  . retired    Social History Main Topics  . Smoking status: Former Smoker    Types: Cigarettes    Quit date: 02/17/1994  . Smokeless tobacco: Never Used  . Alcohol Use: No  . Drug Use: No  . Sexual Activity: No   Other Topics Concern  . Not on file   Social History Narrative    Family History  Problem Relation Age of Onset  . CVA Mother   . Heart attack Sister   . Stroke Maternal Grandfather   .  Hypertension Sister   . Liver cancer Mother   . Diabetes Sister   . Arthritis Sister       Marti Sleigh, MD 10/08/2015, 10:00 AM

## 2015-10-12 ENCOUNTER — Telehealth: Payer: Self-pay

## 2015-10-12 NOTE — Telephone Encounter (Signed)
Patient contacted with an update that we scheduled a cardiology appointment with Sharrell Ku, PA this Friday October 15, 2015 at 3 PM for surgical clearance per Dr Marti Sleigh. Spoke with Mr Diazdeleon the patient was not available, Mr Zucco stated understanding , denied further questions at this time.

## 2015-10-13 DIAGNOSIS — D509 Iron deficiency anemia, unspecified: Secondary | ICD-10-CM | POA: Diagnosis not present

## 2015-10-13 DIAGNOSIS — Z6836 Body mass index (BMI) 36.0-36.9, adult: Secondary | ICD-10-CM | POA: Diagnosis not present

## 2015-10-13 DIAGNOSIS — N839 Noninflammatory disorder of ovary, fallopian tube and broad ligament, unspecified: Secondary | ICD-10-CM | POA: Diagnosis not present

## 2015-10-15 ENCOUNTER — Ambulatory Visit (INDEPENDENT_AMBULATORY_CARE_PROVIDER_SITE_OTHER): Payer: Medicare Other | Admitting: Physician Assistant

## 2015-10-15 ENCOUNTER — Encounter: Payer: Self-pay | Admitting: Physician Assistant

## 2015-10-15 VITALS — BP 120/60 | HR 74 | Ht 63.0 in | Wt 206.8 lb

## 2015-10-15 DIAGNOSIS — Z01810 Encounter for preprocedural cardiovascular examination: Secondary | ICD-10-CM

## 2015-10-15 DIAGNOSIS — I201 Angina pectoris with documented spasm: Secondary | ICD-10-CM

## 2015-10-15 DIAGNOSIS — E785 Hyperlipidemia, unspecified: Secondary | ICD-10-CM

## 2015-10-15 DIAGNOSIS — Z8673 Personal history of transient ischemic attack (TIA), and cerebral infarction without residual deficits: Secondary | ICD-10-CM | POA: Diagnosis not present

## 2015-10-15 DIAGNOSIS — R0602 Shortness of breath: Secondary | ICD-10-CM

## 2015-10-15 NOTE — Patient Instructions (Signed)
Medication Instructions:  Your physician recommends that you continue on your current medications as directed. Please refer to the Current Medication list given to you today.   Labwork: None ordered  Testing/Procedures: Your physician has requested that you have a lexiscan myoview. For further information please visit [email or web address]. Please follow instruction sheet, as given. (To be scheduled asap for pre-op clearance)  Follow-Up: Your physician recommends that you schedule a follow-up appointment in: 3 months with Dr.Smith   Any Other Special Instructions Will Be Listed Below (If Applicable).     If you need a refill on your cardiac medications before your next appointment, please call your pharmacy.

## 2015-10-15 NOTE — Progress Notes (Signed)
Cardiology Office Note    Date:  10/15/2015  ID:  Brandy Paul, Brandy Paul Mar 05, 1946, MRN [id number] PCP:  Brandy Hatchet, MD  Cardiologist:  Dr. Tamala Paul  Chief Complaint: pre-op evaluation  History of Present Illness:  ABBEYGALE Paul is a 70 y.o. 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 Per Dr. Tamala Paul she has history of coronary spasm commencing in 2003, with 3 recurring episodes of prolonged discomfort associated with myocardial injury. There was a stable pattern of recurring chest pain intermittently over the past decade relieved wtih SL NTG. Last echo 2015: LVEF normal, 60%, mild TR. Nuc stress test 2013 was negative. She was recently found to have a complex left adnexal mass after presenting to the ER for epigastric pain and abdominal pain. Troponin was negative. OB-GYN is recommending surgery and if surgery felt too risky, then serial pelvic ultrasounds.  She presents for pre-op eval. She says she's been doing well from a cardiac perspective. She has not had recurrent chest pain. She does have occasional SOB - occurs with exertion or "when I get excited." She uses Spiriva daily for COPD. She has not had any wheezing. She is very sedentary - not active at all. Walks with a cane.   Past Medical History:  Diagnosis Date  . Breast cancer ([place]) 2005   left  . COPD (chronic obstructive pulmonary disease) (HCC)    hx of tobacco use  . Coronary artery spasm ([place])   . Depression   . Diabetes mellitus    hx of prediabetes priot to cva  . GERD (gastroesophageal reflux disease)   . Hyperlipemia   . IDA (iron deficiency anemia)   . Osteoporosis   . 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 Stroke [name]) 2013   paralytic syndrome of dominant left side  . Toe fracture, left     Past Surgical History:  Procedure Laterality Date  . ABDOMINAL  HYSTERECTOMY     complete  . MASTECTOMY Left   . RADIAL KERATOTOMY Bilateral   . TONSILLECTOMY      Current Medications: Current Outpatient Prescriptions  Medication Sig Dispense Refill  . aspirin 81 MG tablet Take 81 mg by mouth daily.      Marland Kitchen atorvastatin (LIPITOR) 20 MG tablet Take 20 mg by mouth daily.  4  . Calcium Carbonate-Vitamin D (OSCAL 500/200 D-3 PO) Take 1 tablet by mouth daily.     . clopidogrel (PLAVIX) 75 MG tablet Take 75 mg by mouth daily.     Marland Kitchen ezetimibe (ZETIA) 10 MG tablet Take 10 mg by mouth daily.    . isosorbide mononitrate (IMDUR) 60 MG 24 hr tablet TAKE 1 TABLET BY MOUTH EVERY DAY 90 tablet 0  . ketoconazole (NIZORAL) 2 % cream APP ON THE SKIN BID PRF ABDOMINAL RASH  2  . Multiple Vitamins-Minerals (MULTIVITAMIN PO) Take 1 tablet by mouth daily.     . nitroGLYCERIN (NITROSTAT) 0.4 MG SL tablet Place 1 tablet (0.4 mg total) under the tongue every 5 (five) minutes as needed. 25 tablet 3  . RABEprazole (ACIPHEX) 20 MG tablet Take 20 mg by mouth daily.  2  . sertraline (ZOLOFT) 25 MG tablet Take 50 mg by mouth daily.   3  . tiotropium (SPIRIVA HANDIHALER) 18 MCG inhalation capsule Place 18 mcg into inhaler and inhale daily as needed (DIFFICULTY BREATHING).  No current facility-administered medications for this visit.      Allergies:   Ceclor [cefaclor]   Social History   Social History  . Marital status: Married    Spouse name: N/A  . Number of children: 2  . Years of education: N/A   Occupational History  . retired    Social History Main Topics  . Smoking status: Former Smoker    Types: Cigarettes    Quit date: 02/17/1994  . Smokeless tobacco: Never Used  . Alcohol use No  . Drug use: No  . Sexual activity: No   Other Topics Concern  . None   Social History Narrative  . None     Family History:  The patient's family history includes Arthritis in her sister; CVA in her mother; Diabetes in her sister; Heart attack in her sister;  Hypertension in her sister; Liver cancer in her mother; Stroke in her maternal grandfather.   ROS:   Please see the history of present illness. All other systems are reviewed and otherwise negative.    PHYSICAL EXAM:   VS:  BP 120/60   Pulse 74   Ht 5\' 3"  (1.6 m)   Wt 206 lb 12.8 oz (93.8 kg)   BMI 36.63 kg/m   BMI: Body mass index is 36.63 kg/m. GEN: Well nourished, well developed obese WF in no acute distress  HEENT: normocephalic, atraumatic Neck: no JVD, carotid bruits, or masses Cardiac: RRR; no murmurs, rubs, or gallops, no edema  Respiratory: Slightly diminished but otherwise clear to auscultation bilaterally, normal work of breathing GI: soft, nontender, nondistended, + BS MS: no deformity or atrophy  Skin: warm and dry, no rash Neuro:  Alert and Oriented x 3, Strength and sensation are intact, follows commands, expressive aphasia noted (slow cadence of speech at times) Psych: euthymic mood, full affect  Wt Readings from Last 3 Encounters:  10/15/15 206 lb 12.8 oz (93.8 kg)  10/08/15 206 lb 6.4 oz (93.6 kg)  08/22/15 207 lb (93.9 kg)      Studies/Labs Reviewed:   EKG:  EKG was ordered today and personally reviewed by me and demonstrates NSR 74bpm, low voltage QRS, nonspecific T Wave changes (TWI III, avF, flattening V3-V6)  Recent Labs: 08/22/2015: ALT 14; BUN 16; Creatinine, Ser 0.79; Hemoglobin 11.8; Platelets 198; Potassium 3.9; Sodium 139   Lipid Panel No results found for: CHOL, TRIG, HDL, CHOLHDL, VLDL, LDLCALC, LDLDIRECT  Additional studies/ records that were reviewed today include: Summarized above.   ASSESSMENT & PLAN:   1. Pre-operative cardiovascular assessment - EKG shows new TWI in lead III, avF, and flattening in V3-V6 that is new from 11/2014. The TWI in lead III was present on recent ER EKG 08/2015. She denies h/o heart blockages at time of diagnosis of coronary spasm. She is rather sedentary with inadequate METS to assess for exertional angina. She  does c/o chronic DOE. Will proceed with Lexiscan nuclear stress testing prior to clearing for surgery. She is not wheezing and has generally good air movement so I suspect she will tolerate this. I told her if for some reason she has a URI or wheezing the day of the test she needs to let us know so we can postpone it. 2. Coronary artery spasm - see above. No recent episodes requiring NTG.  3. History of stroke c/b right hemiparesis - she has been maintained on Plavix for this at the recommendation of neurology. 4. Hyperlipidemia - LDL 73 in 01/2015. Continue statin.  Disposition: F/u with Dr. Tamala Paul in 3 months.   Medication Adjustments/Labs and Tests Ordered: Current medicines are reviewed at length with the patient today.  Concerns regarding medicines are outlined above. Medication changes, Labs and Tests ordered today are summarized above and listed in the Patient Instructions accessible in Encounters.   Raechel Ache PA-C  10/15/2015 3:14 PM    [facility] [address], [place], [place]  [address] Phone: [phone]; Fax: [phone]

## 2015-10-18 ENCOUNTER — Telehealth (HOSPITAL_COMMUNITY): Payer: Self-pay | Admitting: *Deleted

## 2015-10-18 NOTE — Telephone Encounter (Signed)
Patient given detailed instructions per Myocardial Perfusion Study Information Sheet for the test on 10/20/15 at 7:30. Patient notified to arrive 15 minutes early and that it is imperative to arrive on time for appointment to keep from having the test rescheduled.  If you need to cancel or reschedule your appointment, please call the office within 24 hours of your appointment. Failure to do so may result in a cancellation of your appointment, and a $50 no show fee. Patient verbalized understanding.Brandy Paul

## 2015-10-19 ENCOUNTER — Other Ambulatory Visit: Payer: Self-pay

## 2015-10-19 ENCOUNTER — Telehealth: Payer: Self-pay | Admitting: Gastroenterology

## 2015-10-19 DIAGNOSIS — D62 Acute posthemorrhagic anemia: Secondary | ICD-10-CM

## 2015-10-19 DIAGNOSIS — I639 Cerebral infarction, unspecified: Secondary | ICD-10-CM

## 2015-10-19 DIAGNOSIS — R195 Other fecal abnormalities: Secondary | ICD-10-CM

## 2015-10-19 NOTE — Telephone Encounter (Signed)
We will plan on admitting her to days prior to the procedure for a 2 day vigorous prep. Bed control contacted for morning admission on 12/01/15. Cardiology has been sent a letter about holding Plavix. Mr Wiesen has been contacted.

## 2015-10-19 NOTE — Telephone Encounter (Signed)
Can we try to coordinate to do the procedure at [facility] on 12/03/15? Thanks

## 2015-10-19 NOTE — Telephone Encounter (Signed)
This patient has to be admitted to the hospital for her prep. She is also on Plavix. This was originally planned in February 2017. I think the hospital did not have a bed on the planned day. Please advise on how you would like to proceed.

## 2015-10-20 ENCOUNTER — Ambulatory Visit (HOSPITAL_COMMUNITY): Payer: Medicare Other | Attending: Cardiovascular Disease

## 2015-10-20 DIAGNOSIS — R0609 Other forms of dyspnea: Secondary | ICD-10-CM | POA: Insufficient documentation

## 2015-10-20 DIAGNOSIS — R0602 Shortness of breath: Secondary | ICD-10-CM | POA: Diagnosis not present

## 2015-10-20 DIAGNOSIS — Z01810 Encounter for preprocedural cardiovascular examination: Secondary | ICD-10-CM

## 2015-10-20 DIAGNOSIS — I201 Angina pectoris with documented spasm: Secondary | ICD-10-CM | POA: Diagnosis not present

## 2015-10-20 LAB — MYOCARDIAL PERFUSION IMAGING
CHL CUP NUCLEAR SDS: 0
CHL CUP NUCLEAR SRS: 5
CHL CUP NUCLEAR SSS: 5
LHR: 0.22
LV sys vol: 36 mL
LVDIAVOL: 91 mL (ref 46–106)
NUC STRESS TID: 1.03
Peak HR: 75 {beats}/min
Rest HR: 55 {beats}/min

## 2015-10-20 MED ORDER — TECHNETIUM TC 99M TETROFOSMIN IV KIT
32.6000 | PACK | Freq: Once | INTRAVENOUS | Status: AC | PRN
  Administered 2015-10-20: 33 via INTRAVENOUS
  Filled 2015-10-20: qty 33

## 2015-10-20 MED ORDER — TECHNETIUM TC 99M TETROFOSMIN IV KIT
10.4000 | PACK | Freq: Once | INTRAVENOUS | Status: AC | PRN
  Administered 2015-10-20: 10 via INTRAVENOUS
  Filled 2015-10-20: qty 10

## 2015-10-20 MED ORDER — REGADENOSON 0.4 MG/5ML IV SOLN
0.4000 mg | Freq: Once | INTRAVENOUS | Status: AC
  Administered 2015-10-20: 0.4 mg via INTRAVENOUS

## 2015-10-21 ENCOUNTER — Telehealth: Payer: Self-pay

## 2015-10-21 NOTE — Telephone Encounter (Signed)
Orders received to contact the patient to update that we have received the Lexiscan results and she is medically cleared to safely proceed with surgery. Contacted the patient , patient not available , spoke with Mr Diogo and updated with medical clearance . Also scheduled an appointment with Dr Marti Sleigh to discuss surgery . Appointment was scheduled for Monday August 7, 20117 , Mr Furrow agreed to date and time ,denied further questions.

## 2015-10-25 ENCOUNTER — Encounter: Payer: Self-pay | Admitting: Gynecology

## 2015-10-25 ENCOUNTER — Ambulatory Visit: Payer: Medicare Other | Attending: Gynecology | Admitting: Gynecology

## 2015-10-25 VITALS — BP 134/58 | HR 70 | Temp 98.2°F | Resp 18 | Ht 63.0 in | Wt 209.6 lb

## 2015-10-25 DIAGNOSIS — Z79899 Other long term (current) drug therapy: Secondary | ICD-10-CM | POA: Insufficient documentation

## 2015-10-25 DIAGNOSIS — Z87891 Personal history of nicotine dependence: Secondary | ICD-10-CM | POA: Diagnosis not present

## 2015-10-25 DIAGNOSIS — Z888 Allergy status to other drugs, medicaments and biological substances status: Secondary | ICD-10-CM | POA: Insufficient documentation

## 2015-10-25 DIAGNOSIS — K219 Gastro-esophageal reflux disease without esophagitis: Secondary | ICD-10-CM | POA: Insufficient documentation

## 2015-10-25 DIAGNOSIS — Z853 Personal history of malignant neoplasm of breast: Secondary | ICD-10-CM | POA: Diagnosis not present

## 2015-10-25 DIAGNOSIS — Z9889 Other specified postprocedural states: Secondary | ICD-10-CM | POA: Diagnosis not present

## 2015-10-25 DIAGNOSIS — Z8673 Personal history of transient ischemic attack (TIA), and cerebral infarction without residual deficits: Secondary | ICD-10-CM | POA: Insufficient documentation

## 2015-10-25 DIAGNOSIS — F329 Major depressive disorder, single episode, unspecified: Secondary | ICD-10-CM | POA: Insufficient documentation

## 2015-10-25 DIAGNOSIS — Z823 Family history of stroke: Secondary | ICD-10-CM | POA: Diagnosis not present

## 2015-10-25 DIAGNOSIS — D3912 Neoplasm of uncertain behavior of left ovary: Secondary | ICD-10-CM | POA: Insufficient documentation

## 2015-10-25 DIAGNOSIS — Z7982 Long term (current) use of aspirin: Secondary | ICD-10-CM | POA: Insufficient documentation

## 2015-10-25 DIAGNOSIS — D509 Iron deficiency anemia, unspecified: Secondary | ICD-10-CM | POA: Insufficient documentation

## 2015-10-25 DIAGNOSIS — J449 Chronic obstructive pulmonary disease, unspecified: Secondary | ICD-10-CM | POA: Insufficient documentation

## 2015-10-25 DIAGNOSIS — E119 Type 2 diabetes mellitus without complications: Secondary | ICD-10-CM | POA: Insufficient documentation

## 2015-10-25 DIAGNOSIS — D4959 Neoplasm of unspecified behavior of other genitourinary organ: Secondary | ICD-10-CM | POA: Diagnosis not present

## 2015-10-25 DIAGNOSIS — E785 Hyperlipidemia, unspecified: Secondary | ICD-10-CM | POA: Diagnosis not present

## 2015-10-25 DIAGNOSIS — Z9012 Acquired absence of left breast and nipple: Secondary | ICD-10-CM | POA: Insufficient documentation

## 2015-10-25 NOTE — Progress Notes (Signed)
Consult Note: Gyn-Onc   Brandy Paul 70 y.o. female  Chief Complaint  Patient presents with  . Ovarian tumor    Follow up visit    Assessment :  6.4 x 5.2 x 3.8 cm complex left adnexal mass. There are some areas of concern including moderately thickened internal septations, nodular peripheral wall thickening and an irregular peripheral nodule. On the other hand there is no internal blood flow and no free fluid. Ideally I would recommend laparoscopic removal of the left adnexa. Intraoperative frozen section be obtained. If this is a malignancy then surgical staging with the robot would be recommended. The best avoid a large midline incision given the patient's other medical problems unless it is necessary. Patient's also in agreement to have the right ovary removed.   Since her initial visit she is been seen by her her cardiologist and internist and has been cleared for surgery. We will check with her primary care physician regarding whether we may safely discontinue Plavix.  Plan: We will proceed with scheduling laparoscopic left salpingo-oophorectomy (and right salpingo-oophorectomy) with intraoperative frozen section. Should malignancy be found the patient understands further surgical staging including omentectomy, and lymphadenectomy would be recommended. Surgery is scheduled with Dr. Denman George as the primary surgeon (11/04/2015)  HPI: 27-year-old white married female gravida 4 para 2 seen in consultation at the request of Dr.Scott Holwerda regarding management of a newly diagnosed left adnexal mass. The patient initially presented to the emergency department with epigastric pain. CT scan was obtained that demonstrated diverticulosis of the sigmoid and left colon but no evidence of abscess or acute or inflammatory processes. An incidental 4.8 cm left adnexal mass was also seen. Further evaluation with an ultrasound showed the left ovary measures 6.4 x 5.2 x 3.8 cm. There are some areas of concern  including moderately thickened internal septations, nodular peripheral wall thickening and an irregular peripheral nodule. On the other hand there is no internal blood flow and no free fluid.  The patient denies any past gynecologic history. She denies any family history of breast or ovarian cancer.  On the other hand, patient has a number of medical problems including coronary artery spasm, a right CVA (2013). The patient has functional deficit on the right side and uses a cane. Diet-controlled diabetes and COPD associated with history of tobacco abuse. The patient comes accompanied by her husband.  Review of Systems:10 point review of systems is negative except as noted in interval history.   Vitals: Blood pressure (!) 134/58, pulse 70, temperature 98.2 F (36.8 C), temperature source Oral, resp. rate 18, height 5\' 3"  (1.6 m), weight 209 lb 9.6 oz (95.1 kg), SpO2 99 %.  Physical Exam: General : The patient is in no acute distress.  HEENT: normocephalic, extraoccular movements normal; neck is supple without thyromegally  Lynphnodes: Supraclavicular and inguinal nodes not enlarged  Abdomen: Obese, Soft, non-tender, no ascites, no organomegally, no masses, no hernias  Pelvic:  EGBUS: Normal female  Vagina: Normal, no lesions  Urethra and Bladder: Normal, non-tender  Cervix: Atrophic. No lesions are seen. Uterus: I cannot palpate the uterus secondary to the patient's habitus. Bi-manual examination: Non-tender; no adenxal masses or nodularity  Rectal: normal sphincter tone, no masses, no blood  Lower extremities: No edema or varicosities. Normal range of motion      Allergies  Allergen Reactions  . Ceclor [Cefaclor] Other (See Comments)    REACTION: "SEVERE HEADACHE"    Past Medical History:  Diagnosis Date  . Breast cancer (  [place]) 2005   left  . COPD (chronic obstructive pulmonary disease) (HCC)    hx of tobacco use  . Coronary artery spasm ([place])   . Depression   . Diabetes  mellitus    hx of prediabetes priot to cva  . GERD (gastroesophageal reflux disease)   . Hyperlipemia   . IDA (iron deficiency anemia)   . Osteoporosis   . 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 Stroke [facility]) 2013   paralytic syndrome of dominant left side  . Toe fracture, left     Past Surgical History:  Procedure Laterality Date  . ABDOMINAL HYSTERECTOMY     complete  . MASTECTOMY Left   . RADIAL KERATOTOMY Bilateral   . TONSILLECTOMY      Current Outpatient Prescriptions  Medication Sig Dispense Refill  . aspirin 81 MG tablet Take 81 mg by mouth daily.      Marland Kitchen atorvastatin (LIPITOR) 20 MG tablet Take 20 mg by mouth daily.  4  . Calcium Carbonate-Vitamin D (OSCAL 500/200 D-3 PO) Take 1 tablet by mouth daily.     . clopidogrel (PLAVIX) 75 MG tablet Take 75 mg by mouth daily.     Marland Kitchen ezetimibe (ZETIA) 10 MG tablet Take 10 mg by mouth daily.    . isosorbide mononitrate (IMDUR) 60 MG 24 hr tablet TAKE 1 TABLET BY MOUTH EVERY DAY 90 tablet 0  . ketoconazole (NIZORAL) 2 % cream APP ON THE SKIN BID PRF ABDOMINAL RASH  2  . Multiple Vitamins-Minerals (MULTIVITAMIN PO) Take 1 tablet by mouth daily.     . nitroGLYCERIN (NITROSTAT) 0.4 MG SL tablet Place 1 tablet (0.4 mg total) under the tongue every 5 (five) minutes as needed. 25 tablet 3  . RABEprazole (ACIPHEX) 20 MG tablet Take 20 mg by mouth daily.  2  . sertraline (ZOLOFT) 25 MG tablet Take 50 mg by mouth daily.   3  . tiotropium (SPIRIVA HANDIHALER) 18 MCG inhalation capsule Place 18 mcg into inhaler and inhale daily as needed (DIFFICULTY BREATHING).      No current facility-administered medications for this visit.     Social History   Social History  . Marital status: Married    Spouse name: N/A  . Number of children: 2  . Years of education: N/A   Occupational History  . retired    Social History Main Topics  . Smoking status: Former Smoker    Types: Cigarettes    Quit date: 02/17/1994  . Smokeless tobacco: Never  Used  . Alcohol use No  . Drug use: No  . Sexual activity: No   Other Topics Concern  . Not on file   Social History Narrative  . No narrative on file    Family History  Problem Relation Age of Onset  . CVA Mother   . Heart attack Sister   . Stroke Maternal Grandfather   . Hypertension Sister   . Liver cancer Mother   . Diabetes Sister   . Arthritis Sister       Marti Sleigh, MD 10/25/2015, 12:12 PM

## 2015-10-25 NOTE — Patient Instructions (Signed)
Preparing for your Surgery  Plan for surgery on November 04, 2015 with Dr. Everitt Amber at [place] will be scheduled for a robotic assisted bilateral salpingo-oophorectomy, possible staging.  Pre-operative Testing -You will receive a phone call from presurgical testing at [facility] to arrange for a pre-operative testing appointment before your surgery.  This appointment normally occurs one to two weeks before your scheduled surgery.   -Bring your insurance card, copy of an advanced directive if applicable, medication list  -At that visit, you will be asked to sign a consent for a possible blood transfusion in case a transfusion becomes necessary during surgery.  The need for a blood transfusion is rare but having consent is a necessary part of your care.     -You should not be taking blood thinners or aspirin at least ten days prior to surgery unless instructed by your surgeon. (We will let you know Dr. Hoover Brunette recommendations for stopping the plavix)  Day Before Surgery at [place] will be asked to take in a light diet the day before surgery.  Avoid carbonated beverages.  You will be advised to have nothing to eat or drink after midnight the evening before.     Eat a light diet the day before surgery.  Examples including soups, broths, toast, yogurt, mashed potatoes.  Things to avoid include carbonated beverages (fizzy beverages), raw fruits and raw vegetables, or beans.    If your bowels are filled with gas, your surgeon will have difficulty visualizing your pelvic organs which increases your surgical risks.  Your role in recovery Your role is to become active as soon as directed by your doctor, while still giving yourself time to heal.  Rest when you feel tired. You will be asked to do the following in order to speed your recovery:  - Cough and breathe deeply. This helps toclear and expand your lungs and can prevent pneumonia. You may be given a  spirometer to practice deep breathing. A staff member will show you how to use the spirometer. - Do mild physical activity. Walking or moving your legs help your circulation and body functions return to normal. A staff member will help you when you try to walk and will provide you with simple exercises. Do not try to get up or walk alone the first time. - Actively manage your pain. Managing your pain lets you move in comfort. We will ask you to rate your pain on a scale of zero to 10. It is your responsibility to tell your doctor or nurse where and how much you hurt so your pain can be treated.  Special Considerations -If you are diabetic, you may be placed on insulin after surgery to have closer control over your blood sugars to promote healing and recovery.  This does not mean that you will be discharged on insulin.  If applicable, your oral antidiabetics will be resumed when you are tolerating a solid diet.  -Your final pathology results from surgery should be available by the Friday after surgery and the results will be relayed to you when available.   Blood Transfusion Information WHAT IS A BLOOD TRANSFUSION? A transfusion is the replacement of blood or some of its parts. Blood is made up of multiple cells which provide different functions.  Red blood cells carry oxygen and are used for blood loss replacement.  White blood cells fight against infection.  Platelets control bleeding.  Plasma helps clot blood.  Other blood products are  available for specialized needs, such as hemophilia or other clotting disorders. BEFORE THE TRANSFUSION  Who gives blood for transfusions?   You may be able to donate blood to be used at a later date on yourself (autologous donation).  Relatives can be asked to donate blood. This is generally not any safer than if you have received blood from a stranger. The same precautions are taken to ensure safety when a relative's blood is donated.  Healthy  volunteers who are fully evaluated to make sure their blood is safe. This is blood bank blood. Transfusion therapy is the safest it has ever been in the practice of medicine. Before blood is taken from a donor, a complete history is taken to make sure that person has no history of diseases nor engages in risky social behavior (examples are intravenous drug use or sexual activity with multiple partners). The donor's travel history is screened to minimize risk of transmitting infections, such as malaria. The donated blood is tested for signs of infectious diseases, such as HIV and hepatitis. The blood is then tested to be sure it is compatible with you in order to minimize the chance of a transfusion reaction. If you or a relative donates blood, this is often done in anticipation of surgery and is not appropriate for emergency situations. It takes many days to process the donated blood. RISKS AND COMPLICATIONS Although transfusion therapy is very safe and saves many lives, the main dangers of transfusion include:   Getting an infectious disease.  Developing a transfusion reaction. This is an allergic reaction to something in the blood you were given. Every precaution is taken to prevent this. The decision to have a blood transfusion has been considered carefully by your caregiver before blood is given. Blood is not given unless the benefits outweigh the risks.

## 2015-10-26 ENCOUNTER — Encounter: Payer: Self-pay | Admitting: Gynecologic Oncology

## 2015-10-26 ENCOUNTER — Telehealth: Payer: Self-pay

## 2015-10-26 ENCOUNTER — Telehealth: Payer: Self-pay | Admitting: Gynecologic Oncology

## 2015-10-26 NOTE — Telephone Encounter (Signed)
Recomendations received from Dr Ardeth Perfect , stop Plavix 5 days prior to surgery and stop the aspirin the day before surgery. The patient's husband was contacted and updated with recommendations to stop the Plavix and Aspirin. Mr Trusty states understanding ,denies questions at this time.

## 2015-10-26 NOTE — Progress Notes (Signed)
Per Dr. Ardeth Perfect, patient to stop plavix 5 days before surgery and aspirin the day before surgery to to resume as soon as medically safe after surgery.

## 2015-10-26 NOTE — Telephone Encounter (Signed)
Left message with Caryl Pina working with DR. Holwerda about needing recommendations on stopping plavix and aspirin prior to surgery next week.  Left another message with nurse working with Dr. Ardeth Perfect today, Magda Paganini, about needing recommendations for plavix and aspirin cessation prior to surgery.

## 2015-10-29 NOTE — Patient Instructions (Addendum)
Brandy Paul  10/29/2015   Your procedure is scheduled on: 11/04/2015    Report to [facility]  Entrance take [facility]  elevators to 3rd floor to  [place] at    1230pm  Call this number if you have problems the morning of surgery [phone]   Remember: ONLY 1 PERSON MAY GO WITH YOU TO SHORT STAY TO GET  READY MORNING OF YOUR SURGERY.  Eat a light diet the day before surgery.  Examples include; toast, soups, broths and mashed potatoees.  Avoid the following foods: raw fruits, vegetables, beans and carbonated beverages.  You may have clear liquids from 12 midnite the nite before surgery until 0800am then nothing by mouth.       Take these medicines the morning of surgery with A SIP OF WATER: spiriva                               You may not have any metal on your body including hair pins and              piercings  Do not wear jewelry, make-up, lotions, powders or perfumes, deodorant             Do not wear nail polish.  Do not shave  48 hours prior to surgery.                 Do not bring valuables to the hospital. [place].  Contacts, dentures or bridgework may not be worn into surgery.  Leave suitcase in the car. After surgery it may be brought to your room.         Special Instructions: coughing and deep breathing exercises, leg exercises               Please read over the following fact sheets you were given: _____________________________________________________________________                CLEAR LIQUID DIET   Foods Allowed                                                                     Foods Excluded  Coffee and tea, regular and decaf                             liquids that you cannot  Plain Jell-O in any flavor                                             see through such as: Fruit ices (not with fruit pulp)                                     milk, soups, orange juice   Iced Popsicles  All solid food Carbonated beverages, regular and diet                                    Cranberry, grape and apple juices Sports drinks like Gatorade Lightly seasoned clear broth or consume(fat free) Sugar, honey syrup  Sample Menu Breakfast                                Lunch                                     Supper Cranberry juice                    Beef broth                            Chicken broth Jell-O                                     Grape juice                           Apple juice Coffee or tea                        Jell-O                                      Popsicle                                                Coffee or tea                        Coffee or tea  _____________________________________________________________________  [facility] - Preparing for Surgery Before surgery, you can play an important role.  Because skin is not sterile, your skin needs to be as free of germs as possible.  You can reduce the number of germs on your skin by washing with CHG (chlorahexidine gluconate) soap before surgery.  CHG is an antiseptic cleaner which kills germs and bonds with the skin to continue killing germs even after washing. Please DO NOT use if you have an allergy to CHG or antibacterial soaps.  If your skin becomes reddened/irritated stop using the CHG and inform your nurse when you arrive at Short Stay. Do not shave (including legs and underarms) for at least 48 hours prior to the first CHG shower.  You may shave your face/neck. Please follow these instructions carefully:  1.  Shower with CHG Soap the night before surgery and the  morning of Surgery.  2.  If you choose to wash your hair, wash your hair first as usual with your  normal  shampoo.  3.  After you shampoo, rinse your hair and body thoroughly to remove the  shampoo.  4.  Use CHG as you would any other liquid soap.  You can apply chg  directly  to the skin and wash                       Gently with a scrungie or clean washcloth.  5.  Apply the CHG Soap to your body ONLY FROM THE NECK DOWN.   Do not use on face/ open                           Wound or open sores. Avoid contact with eyes, ears mouth and genitals (private parts).                       Wash face,  Genitals (private parts) with your normal soap.             6.  Wash thoroughly, paying special attention to the area where your surgery  will be performed.  7.  Thoroughly rinse your body with warm water from the neck down.  8.  DO NOT shower/wash with your normal soap after using and rinsing off  the CHG Soap.                9.  Pat yourself dry with a clean towel.            10.  Wear clean pajamas.            11.  Place clean sheets on your bed the night of your first shower and do not  sleep with pets. Day of Surgery : Do not apply any lotions/deodorants the morning of surgery.  Please wear clean clothes to the hospital/surgery center.  FAILURE TO FOLLOW THESE INSTRUCTIONS [place] RESULT IN THE CANCELLATION OF YOUR SURGERY PATIENT SIGNATURE_________________________________  NURSE SIGNATURE__________________________________  ________________________________________________________________________  WHAT IS A BLOOD TRANSFUSION? Blood Transfusion Information  A transfusion is the replacement of blood or some of its parts. Blood is made up of multiple cells which provide different functions.  Red blood cells carry oxygen and are used for blood loss replacement.  White blood cells fight against infection.  Platelets control bleeding.  Plasma helps clot blood.  Other blood products are available for specialized needs, such as hemophilia or other clotting disorders. BEFORE THE TRANSFUSION  Who gives blood for transfusions?   Healthy volunteers who are fully evaluated to make sure their blood is safe. This is blood bank blood. Transfusion therapy is the safest  it has ever been in the practice of medicine. Before blood is taken from a donor, a complete history is taken to make sure that person has no history of diseases nor engages in risky social behavior (examples are intravenous drug use or sexual activity with multiple partners). The donor's travel history is screened to minimize risk of transmitting infections, such as malaria. The donated blood is tested for signs of infectious diseases, such as HIV and hepatitis. The blood is then tested to be sure it is compatible with you in order to minimize the chance of a transfusion reaction. If you or a relative donates blood, this is often done in anticipation of surgery and is not appropriate for emergency situations. It takes many days to process the donated blood. RISKS AND COMPLICATIONS Although transfusion therapy is very safe and saves many lives, the main dangers of transfusion include:   Getting an infectious disease.  Developing a transfusion reaction.  This is an allergic reaction to something in the blood you were given. Every precaution is taken to prevent this. The decision to have a blood transfusion has been considered carefully by your caregiver before blood is given. Blood is not given unless the benefits outweigh the risks. AFTER THE TRANSFUSION  Right after receiving a blood transfusion, you will usually feel much better and more energetic. This is especially true if your red blood cells have gotten low (anemic). The transfusion raises the level of the red blood cells which carry oxygen, and this usually causes an energy increase.  The nurse administering the transfusion will monitor you carefully for complications. HOME CARE INSTRUCTIONS  No special instructions are needed after a transfusion. You Doria find your energy is better. Speak with your caregiver about any limitations on activity for underlying diseases you [place] have. SEEK MEDICAL CARE IF:   Your condition is not improving after  your transfusion.  You develop redness or irritation at the intravenous (IV) site. SEEK IMMEDIATE MEDICAL CARE IF:  Any of the following symptoms occur over the next 12 hours:  Shaking chills.  You have a temperature by mouth above 102 F (38.9 C), not controlled by medicine.  Chest, back, or muscle pain.  People around you feel you are not acting correctly or are confused.  Shortness of breath or difficulty breathing.  Dizziness and fainting.  You get a rash or develop hives.  You have a decrease in urine output.  Your urine turns a dark color or changes to pink, red, or brown. Any of the following symptoms occur over the next 10 days:  You have a temperature by mouth above 102 F (38.9 C), not controlled by medicine.  Shortness of breath.  Weakness after normal activity.  The white part of the eye turns yellow (jaundice).  You have a decrease in the amount of urine or are urinating less often.  Your urine turns a dark color or changes to pink, red, or brown. Document Released: 03/03/2000 Document Revised: 05/29/2011 Document Reviewed: 10/21/2007 [facility] Patient Information 2014 [place], [place].  _______________________________________________________________________

## 2015-11-02 ENCOUNTER — Encounter (HOSPITAL_COMMUNITY)
Admission: RE | Admit: 2015-11-02 | Discharge: 2015-11-02 | Disposition: A | Discharge status: Home / Self Care | Payer: Medicare Other | Source: Ambulatory Visit | Attending: Gynecologic Oncology | Admitting: Gynecologic Oncology

## 2015-11-02 ENCOUNTER — Encounter (HOSPITAL_COMMUNITY): Payer: Self-pay

## 2015-11-02 DIAGNOSIS — M810 Age-related osteoporosis without current pathological fracture: Secondary | ICD-10-CM | POA: Diagnosis not present

## 2015-11-02 DIAGNOSIS — E785 Hyperlipidemia, unspecified: Secondary | ICD-10-CM | POA: Diagnosis not present

## 2015-11-02 DIAGNOSIS — F329 Major depressive disorder, single episode, unspecified: Secondary | ICD-10-CM | POA: Diagnosis not present

## 2015-11-02 DIAGNOSIS — E119 Type 2 diabetes mellitus without complications: Secondary | ICD-10-CM | POA: Diagnosis not present

## 2015-11-02 DIAGNOSIS — Z7984 Long term (current) use of oral hypoglycemic drugs: Secondary | ICD-10-CM | POA: Diagnosis not present

## 2015-11-02 DIAGNOSIS — I2510 Atherosclerotic heart disease of native coronary artery without angina pectoris: Secondary | ICD-10-CM | POA: Diagnosis not present

## 2015-11-02 DIAGNOSIS — Z7902 Long term (current) use of antithrombotics/antiplatelets: Secondary | ICD-10-CM | POA: Diagnosis not present

## 2015-11-02 DIAGNOSIS — N7011 Chronic salpingitis: Secondary | ICD-10-CM | POA: Diagnosis not present

## 2015-11-02 DIAGNOSIS — D271 Benign neoplasm of left ovary: Secondary | ICD-10-CM | POA: Diagnosis not present

## 2015-11-02 DIAGNOSIS — I25111 Atherosclerotic heart disease of native coronary artery with angina pectoris with documented spasm: Secondary | ICD-10-CM | POA: Diagnosis not present

## 2015-11-02 DIAGNOSIS — K219 Gastro-esophageal reflux disease without esophagitis: Secondary | ICD-10-CM | POA: Diagnosis not present

## 2015-11-02 DIAGNOSIS — Z853 Personal history of malignant neoplasm of breast: Secondary | ICD-10-CM | POA: Diagnosis not present

## 2015-11-02 DIAGNOSIS — N83202 Unspecified ovarian cyst, left side: Secondary | ICD-10-CM | POA: Diagnosis present

## 2015-11-02 DIAGNOSIS — D270 Benign neoplasm of right ovary: Secondary | ICD-10-CM | POA: Diagnosis not present

## 2015-11-02 DIAGNOSIS — Z9012 Acquired absence of left breast and nipple: Secondary | ICD-10-CM | POA: Diagnosis not present

## 2015-11-02 DIAGNOSIS — Z87891 Personal history of nicotine dependence: Secondary | ICD-10-CM | POA: Diagnosis not present

## 2015-11-02 DIAGNOSIS — J449 Chronic obstructive pulmonary disease, unspecified: Secondary | ICD-10-CM | POA: Diagnosis not present

## 2015-11-02 DIAGNOSIS — Z79899 Other long term (current) drug therapy: Secondary | ICD-10-CM | POA: Diagnosis not present

## 2015-11-02 DIAGNOSIS — Z7982 Long term (current) use of aspirin: Secondary | ICD-10-CM | POA: Diagnosis not present

## 2015-11-02 DIAGNOSIS — I69351 Hemiplegia and hemiparesis following cerebral infarction affecting right dominant side: Secondary | ICD-10-CM | POA: Diagnosis not present

## 2015-11-02 HISTORY — DX: Rash and other nonspecific skin eruption: R21

## 2015-11-02 HISTORY — DX: Prediabetes: R73.03

## 2015-11-02 HISTORY — DX: Stress incontinence (female) (male): N39.3

## 2015-11-02 LAB — COMPREHENSIVE METABOLIC PANEL
ALBUMIN: 3.9 g/dL (ref 3.5–5.0)
ALK PHOS: 86 U/L (ref 38–126)
ALT: 12 U/L — ABNORMAL LOW (ref 14–54)
ANION GAP: 6 (ref 5–15)
AST: 18 U/L (ref 15–41)
BILIRUBIN TOTAL: 0.4 mg/dL (ref 0.3–1.2)
BUN: 18 mg/dL (ref 6–20)
CALCIUM: 9.1 mg/dL (ref 8.9–10.3)
CO2: 30 mmol/L (ref 22–32)
Chloride: 103 mmol/L (ref 101–111)
Creatinine, Ser: 0.72 mg/dL (ref 0.44–1.00)
GFR calc Af Amer: >60 mL/min (ref >60)
GFR calc non Af Amer: >60 mL/min (ref >60)
GLUCOSE: 109 mg/dL — AB (ref 65–99)
Potassium: 4 mmol/L (ref 3.5–5.1)
SODIUM: 139 mmol/L (ref 135–145)
TOTAL PROTEIN: 7.2 g/dL (ref 6.5–8.1)

## 2015-11-02 LAB — URINALYSIS, ROUTINE W REFLEX MICROSCOPIC
Bilirubin Urine: NEGATIVE
Glucose, UA: NEGATIVE mg/dL
HGB URINE DIPSTICK: NEGATIVE
Ketones, ur: NEGATIVE mg/dL
NITRITE: NEGATIVE
PROTEIN: NEGATIVE mg/dL
SPECIFIC GRAVITY, URINE: 1.026 (ref 1.005–1.030)
pH: 5.5 (ref 5.0–8.0)

## 2015-11-02 LAB — ABO/RH: ABO/RH(D): A POS

## 2015-11-02 LAB — URINE MICROSCOPIC-ADD ON

## 2015-11-02 LAB — CBC WITH DIFFERENTIAL/PLATELET
BASOS ABS: 0 10*3/uL (ref 0.0–0.1)
BASOS PCT: 0 %
EOS ABS: 0.2 10*3/uL (ref 0.0–0.7)
Eosinophils Relative: 3 %
HEMATOCRIT: 36.8 % (ref 36.0–46.0)
HEMOGLOBIN: 11.2 g/dL — AB (ref 12.0–15.0)
Lymphocytes Relative: 31 %
Lymphs Abs: 2.1 10*3/uL (ref 0.7–4.0)
MCH: 24.3 pg — ABNORMAL LOW (ref 26.0–34.0)
MCHC: 30.4 g/dL (ref 30.0–36.0)
MCV: 79.8 fL (ref 78.0–100.0)
Monocytes Absolute: 0.6 10*3/uL (ref 0.1–1.0)
Monocytes Relative: 10 %
NEUTROS ABS: 3.8 10*3/uL (ref 1.7–7.7)
NEUTROS PCT: 56 %
Platelets: 216 10*3/uL (ref 150–400)
RBC: 4.61 MIL/uL (ref 3.87–5.11)
RDW: 15.7 % — ABNORMAL HIGH (ref 11.5–15.5)
WBC: 6.7 10*3/uL (ref 4.0–10.5)

## 2015-11-02 NOTE — Progress Notes (Signed)
Micro, ua results routed to [place] [facility] no to epic in basket

## 2015-11-02 NOTE — Progress Notes (Signed)
ekg 10-15-15 epic Stress test 10-20-15 epic Chest xray 08-22-15 epic 10-15-15 lov cardio epic

## 2015-11-03 LAB — URINE CULTURE: Special Requests: NORMAL

## 2015-11-03 LAB — CA 125: CA 125: 16.5 U/mL (ref 0.0–38.1)

## 2015-11-04 ENCOUNTER — Ambulatory Visit (HOSPITAL_COMMUNITY)
Admission: RE | Admit: 2015-11-04 | Discharge: 2015-11-05 | Disposition: A | Discharge status: Home / Self Care | Payer: Medicare Other | Source: Ambulatory Visit | Attending: Gynecologic Oncology | Admitting: Gynecologic Oncology

## 2015-11-04 ENCOUNTER — Encounter (HOSPITAL_COMMUNITY): Payer: Self-pay | Admitting: *Deleted

## 2015-11-04 ENCOUNTER — Encounter (HOSPITAL_COMMUNITY)
Admission: RE | Disposition: A | Discharge status: Home / Self Care | Payer: Self-pay | Source: Ambulatory Visit | Attending: Gynecologic Oncology

## 2015-11-04 ENCOUNTER — Ambulatory Visit (HOSPITAL_COMMUNITY): Payer: Medicare Other | Admitting: Anesthesiology

## 2015-11-04 DIAGNOSIS — F329 Major depressive disorder, single episode, unspecified: Secondary | ICD-10-CM | POA: Diagnosis not present

## 2015-11-04 DIAGNOSIS — D271 Benign neoplasm of left ovary: Secondary | ICD-10-CM | POA: Diagnosis not present

## 2015-11-04 DIAGNOSIS — Z7984 Long term (current) use of oral hypoglycemic drugs: Secondary | ICD-10-CM | POA: Diagnosis not present

## 2015-11-04 DIAGNOSIS — E785 Hyperlipidemia, unspecified: Secondary | ICD-10-CM | POA: Diagnosis not present

## 2015-11-04 DIAGNOSIS — I2510 Atherosclerotic heart disease of native coronary artery without angina pectoris: Secondary | ICD-10-CM | POA: Diagnosis not present

## 2015-11-04 DIAGNOSIS — N838 Other noninflammatory disorders of ovary, fallopian tube and broad ligament: Secondary | ICD-10-CM | POA: Diagnosis present

## 2015-11-04 DIAGNOSIS — D4959 Neoplasm of unspecified behavior of other genitourinary organ: Secondary | ICD-10-CM | POA: Diagnosis not present

## 2015-11-04 DIAGNOSIS — M810 Age-related osteoporosis without current pathological fracture: Secondary | ICD-10-CM | POA: Diagnosis not present

## 2015-11-04 DIAGNOSIS — R1900 Intra-abdominal and pelvic swelling, mass and lump, unspecified site: Secondary | ICD-10-CM | POA: Diagnosis present

## 2015-11-04 DIAGNOSIS — Z7982 Long term (current) use of aspirin: Secondary | ICD-10-CM | POA: Insufficient documentation

## 2015-11-04 DIAGNOSIS — D270 Benign neoplasm of right ovary: Secondary | ICD-10-CM | POA: Diagnosis not present

## 2015-11-04 DIAGNOSIS — Z9012 Acquired absence of left breast and nipple: Secondary | ICD-10-CM | POA: Insufficient documentation

## 2015-11-04 DIAGNOSIS — I69351 Hemiplegia and hemiparesis following cerebral infarction affecting right dominant side: Secondary | ICD-10-CM | POA: Diagnosis not present

## 2015-11-04 DIAGNOSIS — Z7902 Long term (current) use of antithrombotics/antiplatelets: Secondary | ICD-10-CM | POA: Insufficient documentation

## 2015-11-04 DIAGNOSIS — J449 Chronic obstructive pulmonary disease, unspecified: Secondary | ICD-10-CM | POA: Insufficient documentation

## 2015-11-04 DIAGNOSIS — E119 Type 2 diabetes mellitus without complications: Secondary | ICD-10-CM | POA: Diagnosis not present

## 2015-11-04 DIAGNOSIS — N83202 Unspecified ovarian cyst, left side: Secondary | ICD-10-CM | POA: Diagnosis present

## 2015-11-04 DIAGNOSIS — N9489 Other specified conditions associated with female genital organs and menstrual cycle: Secondary | ICD-10-CM

## 2015-11-04 DIAGNOSIS — K219 Gastro-esophageal reflux disease without esophagitis: Secondary | ICD-10-CM | POA: Diagnosis not present

## 2015-11-04 DIAGNOSIS — N7011 Chronic salpingitis: Secondary | ICD-10-CM | POA: Insufficient documentation

## 2015-11-04 DIAGNOSIS — Z87891 Personal history of nicotine dependence: Secondary | ICD-10-CM | POA: Insufficient documentation

## 2015-11-04 DIAGNOSIS — I25111 Atherosclerotic heart disease of native coronary artery with angina pectoris with documented spasm: Secondary | ICD-10-CM | POA: Diagnosis not present

## 2015-11-04 DIAGNOSIS — Z79899 Other long term (current) drug therapy: Secondary | ICD-10-CM | POA: Insufficient documentation

## 2015-11-04 DIAGNOSIS — Z853 Personal history of malignant neoplasm of breast: Secondary | ICD-10-CM | POA: Insufficient documentation

## 2015-11-04 HISTORY — PX: ROBOTIC ASSISTED BILATERAL SALPINGO OOPHERECTOMY: SHX6078

## 2015-11-04 LAB — TYPE AND SCREEN
ABO/RH(D): A POS
Antibody Screen: NEGATIVE

## 2015-11-04 LAB — GLUCOSE, CAPILLARY
GLUCOSE-CAPILLARY: 97 mg/dL (ref 65–99)
GLUCOSE-CAPILLARY: 118 mg/dL — AB (ref 65–99)

## 2015-11-04 SURGERY — SALPINGO-OOPHORECTOMY, BILATERAL, ROBOT-ASSISTED
Anesthesia: General | Laterality: Bilateral

## 2015-11-04 MED ORDER — PROPOFOL 10 MG/ML IV BOLUS
INTRAVENOUS | Status: DC | PRN
  Administered 2015-11-04: 130 mg via INTRAVENOUS

## 2015-11-04 MED ORDER — ONDANSETRON HCL 4 MG/2ML IJ SOLN: 4.0000 mg | Freq: Four times a day (QID) | INTRAMUSCULAR | Status: DC | PRN

## 2015-11-04 MED ORDER — LIDOCAINE HCL (CARDIAC) 20 MG/ML IV SOLN
INTRAVENOUS | Status: AC
Start: 2015-11-04 — End: 2015-11-04
  Filled 2015-11-04: qty 5

## 2015-11-04 MED ORDER — FENTANYL CITRATE (PF) 100 MCG/2ML IJ SOLN
INTRAMUSCULAR | Status: AC
  Filled 2015-11-04: qty 2

## 2015-11-04 MED ORDER — TIOTROPIUM BROMIDE MONOHYDRATE 18 MCG IN CAPS
18.0000 ug | ORAL_CAPSULE | Freq: Every day | RESPIRATORY_TRACT | Status: DC
  Filled 2015-11-04: qty 5

## 2015-11-04 MED ORDER — OXYCODONE-ACETAMINOPHEN 5-325 MG PO TABS
1.0000 | ORAL_TABLET | ORAL | Status: DC | PRN
  Administered 2015-11-04 – 2015-11-05 (×2): 1 via ORAL
  Filled 2015-11-04 (×2): qty 1

## 2015-11-04 MED ORDER — EZETIMIBE 10 MG PO TABS
10.0000 mg | ORAL_TABLET | Freq: Every day | ORAL | Status: DC
  Administered 2015-11-04: 10 mg via ORAL
  Filled 2015-11-04: qty 1

## 2015-11-04 MED ORDER — ONDANSETRON HCL 4 MG PO TABS: 4.0000 mg | ORAL_TABLET | Freq: Four times a day (QID) | ORAL | Status: DC | PRN

## 2015-11-04 MED ORDER — ONDANSETRON HCL 4 MG/2ML IJ SOLN
INTRAMUSCULAR | Status: AC
  Filled 2015-11-04: qty 2

## 2015-11-04 MED ORDER — KCL IN DEXTROSE-NACL 20-5-0.45 MEQ/L-%-% IV SOLN
INTRAVENOUS | Status: DC
  Administered 2015-11-04: 18:00:00 via INTRAVENOUS
  Filled 2015-11-04: qty 1000

## 2015-11-04 MED ORDER — IBUPROFEN 800 MG PO TABS
800.0000 mg | ORAL_TABLET | Freq: Four times a day (QID) | ORAL | Status: DC
  Administered 2015-11-05: 800 mg via ORAL
  Filled 2015-11-04: qty 1

## 2015-11-04 MED ORDER — PHENYLEPHRINE HCL 10 MG/ML IJ SOLN
INTRAMUSCULAR | Status: DC | PRN
  Administered 2015-11-04 (×2): 80 ug via INTRAVENOUS

## 2015-11-04 MED ORDER — ISOSORBIDE MONONITRATE ER 30 MG PO TB24
60.0000 mg | ORAL_TABLET | Freq: Every day | ORAL | Status: DC
  Administered 2015-11-05: 60 mg via ORAL
  Filled 2015-11-04: qty 2

## 2015-11-04 MED ORDER — LACTATED RINGERS IR SOLN
Status: DC | PRN
  Administered 2015-11-04: 100 mL

## 2015-11-04 MED ORDER — LACTATED RINGERS IV SOLN
INTRAVENOUS | Status: DC
  Administered 2015-11-04 (×2): via INTRAVENOUS

## 2015-11-04 MED ORDER — PROMETHAZINE HCL 25 MG/ML IJ SOLN
6.2500 mg | INTRAMUSCULAR | Status: DC | PRN
Start: 2015-11-04 — End: 2015-11-04

## 2015-11-04 MED ORDER — DEXAMETHASONE SODIUM PHOSPHATE 10 MG/ML IJ SOLN
INTRAMUSCULAR | Status: DC | PRN
  Administered 2015-11-04: 10 mg via INTRAVENOUS

## 2015-11-04 MED ORDER — PROPOFOL 10 MG/ML IV BOLUS
INTRAVENOUS | Status: AC
  Filled 2015-11-04: qty 20

## 2015-11-04 MED ORDER — ONDANSETRON HCL 4 MG/2ML IJ SOLN
INTRAMUSCULAR | Status: DC | PRN
  Administered 2015-11-04: 4 mg via INTRAVENOUS

## 2015-11-04 MED ORDER — FENTANYL CITRATE (PF) 100 MCG/2ML IJ SOLN: 25.0000 ug | INTRAMUSCULAR | Status: DC | PRN

## 2015-11-04 MED ORDER — FENTANYL CITRATE (PF) 100 MCG/2ML IJ SOLN
INTRAMUSCULAR | Status: DC | PRN
  Administered 2015-11-04 (×2): 25 ug via INTRAVENOUS

## 2015-11-04 MED ORDER — PANTOPRAZOLE SODIUM 40 MG PO TBEC
40.0000 mg | DELAYED_RELEASE_TABLET | Freq: Every day | ORAL | Status: DC
Start: 2015-11-04 — End: 2015-11-05
  Administered 2015-11-04 – 2015-11-05 (×2): 40 mg via ORAL
  Filled 2015-11-04 (×2): qty 1

## 2015-11-04 MED ORDER — ROCURONIUM BROMIDE 100 MG/10ML IV SOLN
INTRAVENOUS | Status: DC | PRN
  Administered 2015-11-04: 40 mg via INTRAVENOUS
  Administered 2015-11-04: 20 mg via INTRAVENOUS

## 2015-11-04 MED ORDER — STERILE WATER FOR IRRIGATION IR SOLN
Status: DC | PRN
  Administered 2015-11-04: 1000 mL

## 2015-11-04 MED ORDER — LIDOCAINE HCL (CARDIAC) 20 MG/ML IV SOLN
INTRAVENOUS | Status: DC | PRN
  Administered 2015-11-04: 100 mg via INTRAVENOUS

## 2015-11-04 MED ORDER — NITROGLYCERIN 0.4 MG SL SUBL: 0.4000 mg | SUBLINGUAL_TABLET | SUBLINGUAL | Status: DC | PRN

## 2015-11-04 MED ORDER — ASPIRIN EC 81 MG PO TBEC
81.0000 mg | DELAYED_RELEASE_TABLET | Freq: Every day | ORAL | Status: DC
  Administered 2015-11-05: 81 mg via ORAL
  Filled 2015-11-04: qty 1

## 2015-11-04 MED ORDER — HYDROMORPHONE HCL 1 MG/ML IJ SOLN: 0.2000 mg | INTRAMUSCULAR | Status: DC | PRN

## 2015-11-04 MED ORDER — SUGAMMADEX SODIUM 200 MG/2ML IV SOLN
INTRAVENOUS | Status: DC | PRN
  Administered 2015-11-04: 200 mg via INTRAVENOUS

## 2015-11-04 MED ORDER — DEXAMETHASONE SODIUM PHOSPHATE 10 MG/ML IJ SOLN
INTRAMUSCULAR | Status: AC
  Filled 2015-11-04: qty 1

## 2015-11-04 MED ORDER — SUGAMMADEX SODIUM 200 MG/2ML IV SOLN
INTRAVENOUS | Status: AC
Start: 2015-11-04 — End: 2015-11-04
  Filled 2015-11-04: qty 2

## 2015-11-04 MED ORDER — ROCURONIUM BROMIDE 100 MG/10ML IV SOLN
INTRAVENOUS | Status: AC
  Filled 2015-11-04: qty 1

## 2015-11-04 MED ORDER — SERTRALINE HCL 50 MG PO TABS
50.0000 mg | ORAL_TABLET | Freq: Every day | ORAL | Status: DC
  Administered 2015-11-04: 50 mg via ORAL
  Filled 2015-11-04: qty 1

## 2015-11-04 MED ORDER — ATORVASTATIN CALCIUM 20 MG PO TABS
20.0000 mg | ORAL_TABLET | Freq: Every day | ORAL | Status: DC
  Administered 2015-11-04: 20 mg via ORAL
  Filled 2015-11-04: qty 1

## 2015-11-04 SURGICAL SUPPLY — 53 items
BAG LAPAROSCOPIC 12 15 PORT 16 (BASKET) IMPLANT
BAG RETRIEVAL 12/15 (BASKET)
BAG SPEC RTRVL LRG 6X4 10 (ENDOMECHANICALS) ×2
CHLORAPREP W/TINT 26ML (MISCELLANEOUS) ×4 IMPLANT
COVER SURGICAL LIGHT HANDLE (MISCELLANEOUS) IMPLANT
COVER TIP SHEARS 8 DVNC (MISCELLANEOUS) ×1 IMPLANT
COVER TIP SHEARS 8MM DA VINCI (MISCELLANEOUS) ×1
DRAPE ARM DVNC X/XI (DISPOSABLE) ×4 IMPLANT
DRAPE COLUMN DVNC XI (DISPOSABLE) ×1 IMPLANT
DRAPE DA VINCI XI ARM (DISPOSABLE) ×4
DRAPE DA VINCI XI COLUMN (DISPOSABLE) ×1
DRAPE SHEET LG 3/4 BI-LAMINATE (DRAPES) ×4 IMPLANT
DRAPE SURG IRRIG POUCH 19X23 (DRAPES) ×6 IMPLANT
ELECT REM PT RETURN 9FT ADLT (ELECTROSURGICAL) ×2
ELECTRODE REM PT RTRN 9FT ADLT (ELECTROSURGICAL) ×1 IMPLANT
GLOVE BIO SURGEON STRL SZ 6 (GLOVE) ×8 IMPLANT
GLOVE BIO SURGEON STRL SZ 6.5 (GLOVE) ×6 IMPLANT
GOWN STRL REUS W/ TWL LRG LVL3 (GOWN DISPOSABLE) ×3 IMPLANT
GOWN STRL REUS W/TWL LRG LVL3 (GOWN DISPOSABLE) ×6
HOLDER FOLEY CATH W/STRAP (MISCELLANEOUS) ×2 IMPLANT
IRRIG SUCT STRYKERFLOW 2 WTIP (MISCELLANEOUS) ×2
IRRIGATION SUCT STRKRFLW 2 WTP (MISCELLANEOUS) ×1 IMPLANT
KIT BASIN OR (CUSTOM PROCEDURE TRAY) ×2 IMPLANT
LIQUID BAND (GAUZE/BANDAGES/DRESSINGS) ×2 IMPLANT
MANIPULATOR UTERINE 4.5 ZUMI (MISCELLANEOUS) IMPLANT
MARKER SKIN DUAL TIP RULER LAB (MISCELLANEOUS) ×2 IMPLANT
NEEDLE INSUFFLATION 14GA 120MM (NEEDLE) ×2 IMPLANT
OBTURATOR XI 8MM BLADELESS (TROCAR) ×2 IMPLANT
OCCLUDER COLPOPNEUMO (BALLOONS) IMPLANT
PAD POSITIONING PINK XL (MISCELLANEOUS) ×2 IMPLANT
PORT ACCESS TROCAR AIRSEAL 12 (TROCAR) ×1 IMPLANT
PORT ACCESS TROCAR AIRSEAL 5M (TROCAR) ×1
POUCH SPECIMEN RETRIEVAL 10MM (ENDOMECHANICALS) ×4 IMPLANT
SEAL CANN UNIV 5-8 DVNC XI (MISCELLANEOUS) ×4 IMPLANT
SEAL XI 5MM-8MM UNIVERSAL (MISCELLANEOUS) ×4
SET TRI-LUMEN FLTR TB AIRSEAL (TUBING) ×2 IMPLANT
SHEET LAVH (DRAPES) ×2 IMPLANT
SOLUTION ELECTROLUBE (MISCELLANEOUS) ×2 IMPLANT
SUT MNCRL AB 4-0 PS2 18 (SUTURE) ×4 IMPLANT
SUT VIC AB 0 CT1 27 (SUTURE) ×2
SUT VIC AB 0 CT1 27XBRD ANTBC (SUTURE) ×1 IMPLANT
SUT VIC AB 3-0 SH 27 (SUTURE) ×4
SUT VIC AB 3-0 SH 27XBRD (SUTURE) ×2 IMPLANT
SYR 50ML LL SCALE MARK (SYRINGE) IMPLANT
TOWEL OR 17X26 10 PK STRL BLUE (TOWEL DISPOSABLE) ×4 IMPLANT
TOWEL OR NON WOVEN STRL DISP B (DISPOSABLE) ×2 IMPLANT
TRAP SPECIMEN MUCOUS 40CC (MISCELLANEOUS) ×2 IMPLANT
TRAY FOLEY W/METER SILVER 14FR (SET/KITS/TRAYS/PACK) ×2 IMPLANT
TRAY LAPAROSCOPIC (CUSTOM PROCEDURE TRAY) ×2 IMPLANT
TROCAR BLADELESS OPT 5 100 (ENDOMECHANICALS) ×2 IMPLANT
UNDERPAD 30X30 INCONTINENT (UNDERPADS AND DIAPERS) ×2 IMPLANT
WATER STERILE IRR 1000ML POUR (IV SOLUTION) ×2 IMPLANT
WATER STERILE IRR 1500ML POUR (IV SOLUTION) IMPLANT

## 2015-11-04 NOTE — Anesthesia Preprocedure Evaluation (Addendum)
Anesthesia Evaluation  Patient identified by MRN, date of birth, ID band Patient awake    Reviewed: Allergy & Precautions, H&P , NPO status , Patient's Chart, lab work & pertinent test results  Airway Mallampati: II  TM Distance: >3 FB Neck ROM: full    Dental no notable dental hx. (+) Dental Advisory Given, Teeth Intact   Pulmonary COPD,  COPD inhaler, former smoker,    Pulmonary exam normal breath sounds clear to auscultation       Cardiovascular + CAD  Normal cardiovascular exam Rhythm:regular Rate:Normal  Coronary artery spasm   Neuro/Psych Depression Aphasia. hemiparesis  Neuromuscular disease CVA (R arm/leg weakness), Residual Symptoms negative neurological ROS  negative psych ROS   GI/Hepatic negative GI ROS, Neg liver ROS, GERD  Medicated and Controlled,  Endo/Other  diabetes, Well Controlled, Type 2, Oral Hypoglycemic Agents  Renal/GU negative Renal ROS  negative genitourinary   Musculoskeletal   Abdominal   Peds  Hematology negative hematology ROS (+)   Anesthesia Other Findings   Reproductive/Obstetrics negative OB ROS                            Anesthesia Physical  Anesthesia Plan  ASA: III  Anesthesia Plan: General   Post-op Pain Management:    Induction: Intravenous  Airway Management Planned: Oral ETT  Additional Equipment: None  Intra-op Plan:   Post-operative Plan: Extubation in OR  Informed Consent: I have reviewed the patients History and Physical, chart, labs and discussed the procedure including the risks, benefits and alternatives for the proposed anesthesia with the patient or authorized representative who has indicated his/her understanding and acceptance.   Dental Advisory Given  Plan Discussed with: CRNA and Surgeon  Anesthesia Plan Comments:        Anesthesia Quick Evaluation

## 2015-11-04 NOTE — Anesthesia Postprocedure Evaluation (Signed)
Anesthesia Post Note  Patient: Brandy Paul  Procedure(s) Performed: Procedure(s) (LRB): XI ROBOTIC ASSISTED BILATERAL SALPINGO OOPHORECTOMY (Bilateral)  Patient location during evaluation: PACU Anesthesia Type: General Level of consciousness: awake and alert Pain management: pain level controlled Vital Signs Assessment: post-procedure vital signs reviewed and stable Respiratory status: spontaneous breathing, nonlabored ventilation, respiratory function stable and patient connected to nasal cannula oxygen Cardiovascular status: blood pressure returned to baseline and stable Postop Assessment: no signs of nausea or vomiting Anesthetic complications: no    Last Vitals:  Vitals:   11/04/15 1600 11/04/15 1615  BP: (!) 128/54 (!) 116/54  Pulse: 76 78  Resp: 20 17  Temp:      Last Pain: There were no vitals filed for this visit.               Alexis Frock

## 2015-11-04 NOTE — Anesthesia Procedure Notes (Signed)
Procedure Name: Intubation Date/Time: 11/04/2015 1:30 PM Performed by: Lind Covert Pre-anesthesia Checklist: Patient identified, Timeout performed, Emergency Drugs available, Suction available and Patient being monitored Patient Re-evaluated:Patient Re-evaluated prior to inductionOxygen Delivery Method: Circle system utilized Preoxygenation: Pre-oxygenation with 100% oxygen Intubation Type: IV induction Ventilation: Mask ventilation without difficulty Laryngoscope Size: Mac and 4 Grade View: Grade I Tube type: Oral Tube size: 7.0 mm Number of attempts: 1 Airway Equipment and Method: Stylet Placement Confirmation: ETT inserted through vocal cords under direct vision Secured at: 22 cm Tube secured with: Tape Dental Injury: Teeth and Oropharynx as per pre-operative assessment

## 2015-11-04 NOTE — H&P (View-Only) (Signed)
Consult Note: Gyn-Onc   Brandy Paul 70 y.o. female  Chief Complaint  Patient presents with  . Ovarian tumor    Follow up visit    Assessment :  6.4 x 5.2 x 3.8 cm complex left adnexal mass. There are some areas of concern including moderately thickened internal septations, nodular peripheral wall thickening and an irregular peripheral nodule. On the other hand there is no internal blood flow and no free fluid. Ideally I would recommend laparoscopic removal of the left adnexa. Intraoperative frozen section be obtained. If this is a malignancy then surgical staging with the robot would be recommended. The best avoid a large midline incision given the patient's other medical problems unless it is necessary. Patient's also in agreement to have the right ovary removed.   Since her initial visit she is been seen by her her cardiologist and internist and has been cleared for surgery. We will check with her primary care physician regarding whether we may safely discontinue Plavix.  Plan: We will proceed with scheduling laparoscopic left salpingo-oophorectomy (and right salpingo-oophorectomy) with intraoperative frozen section. Should malignancy be found the patient understands further surgical staging including omentectomy, and lymphadenectomy would be recommended. Surgery is scheduled with Dr. Denman George as the primary surgeon (11/04/2015)  HPI: 71-year-old white married female gravida 4 para 2 seen in consultation at the request of Dr.Scott Holwerda regarding management of a newly diagnosed left adnexal mass. The patient initially presented to the emergency department with epigastric pain. CT scan was obtained that demonstrated diverticulosis of the sigmoid and left colon but no evidence of abscess or acute or inflammatory processes. An incidental 4.8 cm left adnexal mass was also seen. Further evaluation with an ultrasound showed the left ovary measures 6.4 x 5.2 x 3.8 cm. There are some areas of concern  including moderately thickened internal septations, nodular peripheral wall thickening and an irregular peripheral nodule. On the other hand there is no internal blood flow and no free fluid.  The patient denies any past gynecologic history. She denies any family history of breast or ovarian cancer.  On the other hand, patient has a number of medical problems including coronary artery spasm, a right CVA (2013). The patient has functional deficit on the right side and uses a cane. Diet-controlled diabetes and COPD associated with history of tobacco abuse. The patient comes accompanied by her husband.  Review of Systems:10 point review of systems is negative except as noted in interval history.   Vitals: Blood pressure (!) 134/58, pulse 70, temperature 98.2 F (36.8 C), temperature source Oral, resp. rate 18, height 5\' 3"  (1.6 m), weight 209 lb 9.6 oz (95.1 kg), SpO2 99 %.  Physical Exam: General : The patient is in no acute distress.  HEENT: normocephalic, extraoccular movements normal; neck is supple without thyromegally  Lynphnodes: Supraclavicular and inguinal nodes not enlarged  Abdomen: Obese, Soft, non-tender, no ascites, no organomegally, no masses, no hernias  Pelvic:  EGBUS: Normal female  Vagina: Normal, no lesions  Urethra and Bladder: Normal, non-tender  Cervix: Atrophic. No lesions are seen. Uterus: I cannot palpate the uterus secondary to the patient's habitus. Bi-manual examination: Non-tender; no adenxal masses or nodularity  Rectal: normal sphincter tone, no masses, no blood  Lower extremities: No edema or varicosities. Normal range of motion      Allergies  Allergen Reactions  . Ceclor [Cefaclor] Other (See Comments)    REACTION: "SEVERE HEADACHE"    Past Medical History:  Diagnosis Date  . Breast cancer (  [place]) 2005   left  . COPD (chronic obstructive pulmonary disease) ([facility])    hx of tobacco use  . Coronary artery spasm ([place])   . Depression   . Diabetes  mellitus    hx of prediabetes priot to cva  . GERD (gastroesophageal reflux disease)   . Hyperlipemia   . IDA (iron deficiency anemia)   . Osteoporosis   . 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 Stroke [facility]) 2013   paralytic syndrome of dominant left side  . Toe fracture, left     Past Surgical History:  Procedure Laterality Date  . ABDOMINAL HYSTERECTOMY     complete  . MASTECTOMY Left   . RADIAL KERATOTOMY Bilateral   . TONSILLECTOMY      Current Outpatient Prescriptions  Medication Sig Dispense Refill  . aspirin 81 MG tablet Take 81 mg by mouth daily.      Marland Kitchen atorvastatin (LIPITOR) 20 MG tablet Take 20 mg by mouth daily.  4  . Calcium Carbonate-Vitamin D (OSCAL 500/200 D-3 PO) Take 1 tablet by mouth daily.     . clopidogrel (PLAVIX) 75 MG tablet Take 75 mg by mouth daily.     Marland Kitchen ezetimibe (ZETIA) 10 MG tablet Take 10 mg by mouth daily.    . isosorbide mononitrate (IMDUR) 60 MG 24 hr tablet TAKE 1 TABLET BY MOUTH EVERY DAY 90 tablet 0  . ketoconazole (NIZORAL) 2 % cream APP ON THE SKIN BID PRF ABDOMINAL RASH  2  . Multiple Vitamins-Minerals (MULTIVITAMIN PO) Take 1 tablet by mouth daily.     . nitroGLYCERIN (NITROSTAT) 0.4 MG SL tablet Place 1 tablet (0.4 mg total) under the tongue every 5 (five) minutes as needed. 25 tablet 3  . RABEprazole (ACIPHEX) 20 MG tablet Take 20 mg by mouth daily.  2  . sertraline (ZOLOFT) 25 MG tablet Take 50 mg by mouth daily.   3  . tiotropium (SPIRIVA HANDIHALER) 18 MCG inhalation capsule Place 18 mcg into inhaler and inhale daily as needed (DIFFICULTY BREATHING).      No current facility-administered medications for this visit.     Social History   Social History  . Marital status: Married    Spouse name: N/A  . Number of children: 2  . Years of education: N/A   Occupational History  . retired    Social History Main Topics  . Smoking status: Former Smoker    Types: Cigarettes    Quit date: 02/17/1994  . Smokeless tobacco: Never  Used  . Alcohol use No  . Drug use: No  . Sexual activity: No   Other Topics Concern  . Not on file   Social History Narrative  . No narrative on file    Family History  Problem Relation Age of Onset  . CVA Mother   . Heart attack Sister   . Stroke Maternal Grandfather   . Hypertension Sister   . Liver cancer Mother   . Diabetes Sister   . Arthritis Sister       Marti Sleigh, MD 10/25/2015, 12:12 PM

## 2015-11-04 NOTE — Interval H&P Note (Signed)
History and Physical Interval Note:  11/04/2015 12:53 PM  Brandy Paul  has presented today for surgery, with the diagnosis of OVARIAN TUMOR  The various methods of treatment have been discussed with the patient and family. After consideration of risks, benefits and other options for treatment, the patient has consented to  Procedure(s): XI ROBOTIC [place] (Bilateral) as a surgical intervention .  The patient's history has been reviewed, patient examined, no change in status, stable for surgery.  I have reviewed the patient's chart and labs.  Questions were answered to the patient's satisfaction.     [name]

## 2015-11-04 NOTE — Transfer of Care (Signed)
Immediate Anesthesia Transfer of Care Note  Patient: Brandy Paul  Procedure(s) Performed: Procedure(s): XI ROBOTIC ASSISTED BILATERAL SALPINGO OOPHORECTOMY (Bilateral)  Patient Location: PACU  Anesthesia Type:General  Level of Consciousness: sedated  Airway & Oxygen Therapy: Patient Spontanous Breathing and Patient connected to face mask oxygen  Post-op Assessment: Report given to RN and Post -op Vital signs reviewed and stable  Post vital signs: Reviewed and stable  Last Vitals: There were no vitals filed for this visit.  Last Pain: There were no vitals filed for this visit.       Complications: No apparent anesthesia complications

## 2015-11-04 NOTE — Op Note (Signed)
OPERATIVE NOTE 11/04/15   Surgeon: [name]   Assistants: Dr Lahoma Crocker (an MD assistant was necessary for tissue manipulation, management of robotic instrumentation, retraction and positioning due to the complexity of the case and hospital policies).   Anesthesia: General endotracheal anesthesia  ASA Class: 3   Pre-operative Diagnosis: left ovarian complex cyst  Post-operative Diagnosis: left ovarian benign cyst  Operation: Robotic-assisted laparoscopic bilateral salpingoophorectomy, lysis of adhesions  Surgeon: [name]  Assistant Surgeon: Lahoma Crocker MD  Anesthesia: GET  Urine Output: 100  Operative Findings:  : dense adhesions between sigmoid colon and bilateral ovaries. 6cm left fallopian tube/ovarian cyst unavoidably ruptured for clear fluid (benign on frozen). Normal appearing right tube and ovary also densely adherent to sigmoid colon.  Estimated Blood Loss:  less than 50 mL      Total IV Fluids: 400 ml         Specimens: left and right tube and ovary         Complications:  None; patient tolerated the procedure well.         Disposition: PACU - hemodynamically stable.  Procedure Details  The patient was seen in the Holding Room. The risks, benefits, complications, treatment options, and expected outcomes were discussed with the patient.  The patient concurred with the proposed plan, giving informed consent.  The site of surgery properly noted/marked. The patient was identified as Brandy Paul and the procedure verified as a Robotic-assisted bilateral salpingo oophorectomy. A Time Out was held and the above information confirmed.  After induction of anesthesia, the patient was draped and prepped in the usual sterile manner. Pt was placed in supine position after anesthesia and draped and prepped in the usual sterile manner. The abdominal drape was placed after the CholoraPrep had been allowed to dry for 3 minutes.  Her arms were  tucked to her side with all appropriate precautions.  The chest was secured to the table.  The patient was placed in the semi-lithotomy position in [place].  The perineum was prepped with Betadine.  Foley catheter was placed. A second time-out was performed.  OG tube placement was confirmed and to suction.   Next, a 5 mm skin incision was made 1 cm below the subcostal margin in the right midclavicular line. A veress needle was placed intraperitoneally and insufflation took place, however pressures were high. The veress was exchanged for a 5 mm Optiview port and scope was used for direct entry.  Opening pressure was under 10 mm CO2.  The abdomen was insufflated and the findings were noted as above.   At this point and all points during the procedure, the patient's intra-abdominal pressure did not exceed 15 mmHg. Next, a 10 mm skin incision was made in the left upper quadrant after ensuring that the prior PEG site was lateral and superior and no adhesions were present. A 54mm disposable assistant port was placed through this. An 33mm incision was made in the umbilicus and a right and left port was placed about 10 cm lateral to the robot port on the right and left side.  A fourth arm was placed in the left lower quadrant 2 cm above and superior and medial to the anterior superior iliac spine.  All ports were placed under direct visualization.  The patient was placed in steep Trendelenburg.  Bowel was away into the upper abdomen.  The robot was docked in the normal manner.  For 1 hour meticulous sharp dissection was employed to  separate the dense colonic adhesions with the sigmoid colon and ovaries and pelvic peritoneum. A 2cm area of anterior sigmoid colon was deserosalized and was oversewn for reinforcement with interrupted 3-0 vicryl suture. An incision was made on the right pelvic side wall peritoneum parallel to the IP ligament and the retroperitoneal space entered. The right ureter was identified and the  para-rectal space was developed. A window was created in the right broad ligament above the ureter. The right infundibulopelvic vessels were skeletonized cauterized and transected. The utero-ovarian ligaments similarly were cauterized and transected. Specimen was placed in an Endo Catch bag.  In a similar manner the left peritoneum and the side wall was incised, and the retroperitoneal space entered. The left ureter was identified and the left pararectal space was developed. The utero-ovarian ligament was skeletonized cauterized and transected. The left utero-ovarian ligaments were cauterized and transected in the left adnexa was placed in an Endo Catch bag.  The abdomen was copiously irrigated and drained and all operative sites inspected and hemostasis was assured  The robot was undocked. The contents of the left Endo Catch bag were first aspirated and then morcellated to facilitate removal from the abdominal cavity through the left upper quadrant incision. In a similar fashion the contents of the right Endo Catch bag or morcellated to facilitate removal from the abdominal cavity.  The ports were all remove. The fascial closure at the umbilical incision and left upper quadrant port was made with 0 Vicryl.  All incisions were closed with a running subcuticular Monocryl suture. Dermabond was applied. Sponge, lap and needle counts were correct x 3.    The patient had sequential compression devices for VTE prophylaxis.         Disposition: PACU -stable         Condition: stable  [name], MD

## 2015-11-05 ENCOUNTER — Encounter (HOSPITAL_COMMUNITY): Payer: Self-pay | Admitting: Gynecologic Oncology

## 2015-11-05 DIAGNOSIS — D270 Benign neoplasm of right ovary: Secondary | ICD-10-CM | POA: Diagnosis not present

## 2015-11-05 DIAGNOSIS — N7011 Chronic salpingitis: Secondary | ICD-10-CM | POA: Diagnosis not present

## 2015-11-05 DIAGNOSIS — E119 Type 2 diabetes mellitus without complications: Secondary | ICD-10-CM | POA: Diagnosis not present

## 2015-11-05 DIAGNOSIS — J449 Chronic obstructive pulmonary disease, unspecified: Secondary | ICD-10-CM | POA: Diagnosis not present

## 2015-11-05 DIAGNOSIS — D271 Benign neoplasm of left ovary: Secondary | ICD-10-CM | POA: Diagnosis not present

## 2015-11-05 DIAGNOSIS — I25111 Atherosclerotic heart disease of native coronary artery with angina pectoris with documented spasm: Secondary | ICD-10-CM | POA: Diagnosis not present

## 2015-11-05 LAB — BASIC METABOLIC PANEL
Anion gap: 5 (ref 5–15)
BUN: 13 mg/dL (ref 6–20)
CALCIUM: 8.5 mg/dL — AB (ref 8.9–10.3)
CO2: 29 mmol/L (ref 22–32)
CREATININE: 0.81 mg/dL (ref 0.44–1.00)
Chloride: 104 mmol/L (ref 101–111)
GFR calc Af Amer: >60 mL/min (ref >60)
Glucose, Bld: 155 mg/dL — ABNORMAL HIGH (ref 65–99)
Potassium: 4.4 mmol/L (ref 3.5–5.1)
SODIUM: 138 mmol/L (ref 135–145)

## 2015-11-05 LAB — CBC
HCT: 32.6 % — ABNORMAL LOW (ref 36.0–46.0)
Hemoglobin: 10 g/dL — ABNORMAL LOW (ref 12.0–15.0)
MCH: 24.1 pg — AB (ref 26.0–34.0)
MCHC: 30.7 g/dL (ref 30.0–36.0)
MCV: 78.6 fL (ref 78.0–100.0)
PLATELETS: 188 10*3/uL (ref 150–400)
RBC: 4.15 MIL/uL (ref 3.87–5.11)
RDW: 15.5 % (ref 11.5–15.5)
WBC: 12.2 10*3/uL — ABNORMAL HIGH (ref 4.0–10.5)

## 2015-11-05 MED ORDER — CLOPIDOGREL BISULFATE 75 MG PO TABS: 75.0000 mg | ORAL_TABLET | Freq: Every day | ORAL | Status: DC

## 2015-11-05 MED ORDER — TIOTROPIUM BROMIDE MONOHYDRATE 18 MCG IN CAPS
18.0000 ug | ORAL_CAPSULE | Freq: Every day | RESPIRATORY_TRACT | Status: DC
  Filled 2015-11-05: qty 5

## 2015-11-05 MED ORDER — TRAMADOL HCL 50 MG PO TABS: 50.0000 mg | ORAL_TABLET | Freq: Four times a day (QID) | ORAL | 0 refills | Status: DC | PRN

## 2015-11-05 NOTE — Discharge Instructions (Addendum)
11/05/2015  Resume Plavix on Sunday, November 07, 2015.  You can resume your aspirin 81 mg today.  Activity: 1. Be up and out of the bed during the day.  Take a nap if needed.  You may walk up steps but be careful and use the hand rail.  Stair climbing will tire you more than you think, you may need to stop part way and rest.   2. No lifting or straining for 6 weeks.  3. Shower daily.  Use soap and water on your incision and pat dry; don't rub.  No tub baths until cleared by your surgeon.   4. No sexual activity and nothing in the vagina for 6 weeks.  5. You may experience a small amount of clear drainage from your incisions, which is normal.  If the drainage persists or increases, please call the office.   Diet: 1. Low sodium Heart Healthy Diet is recommended.  2. It is safe to use a laxative, such as Miralax or Colace, if you have difficulty moving your bowels.   Wound Care: 1. Keep clean and dry.  Shower daily.  Reasons to call the Doctor:  Fever - Oral temperature greater than 100.4 degrees Fahrenheit  Foul-smelling vaginal discharge  Difficulty urinating  Nausea and vomiting  Increased pain at the site of the incision that is unrelieved with pain medicine.  Difficulty breathing with or without chest pain  New calf pain especially if only on one side  Sudden, continuing increased vaginal bleeding with or without clots.   Contacts: For questions or concerns you should contact:  Dr. Everitt Amber at [phone]  Joylene John, NP at [phone]  After Hours: call [phone] and have the GYN Oncologist paged/contacted  Tramadol tablets What is this medicine? TRAMADOL (TRA ma dole) is a pain reliever. It is used to treat moderate to severe pain in adults. This medicine may be used for other purposes; ask your health care provider or pharmacist if you have questions. What should I tell my health care provider before I take this medicine? They need to know if you  have any of these conditions: -brain tumor -depression -drug abuse or addiction -head injury -if you frequently drink alcohol containing drinks -kidney disease or trouble passing urine -liver disease -lung disease, asthma, or breathing problems -seizures or epilepsy -suicidal thoughts, plans, or attempt; a previous suicide attempt by you or a family member -an unusual or allergic reaction to tramadol, codeine, other medicines, foods, dyes, or preservatives -pregnant or trying to get pregnant -breast-feeding How should I use this medicine? Take this medicine by mouth with a full glass of water. Follow the directions on the prescription label. If the medicine upsets your stomach, take it with food or milk. Do not take more medicine than you are told to take. Talk to your pediatrician regarding the use of this medicine in children. Special care may be needed. Overdosage: If you think you have taken too much of this medicine contact a poison control center or emergency room at once. NOTE: This medicine is only for you. Do not share this medicine with others. What if I miss a dose? If you miss a dose, take it as soon as you can. If it is almost time for your next dose, take only that dose. Do not take double or extra doses. What may interact with this medicine? Do not take this medicine with any of the following medications: -MAOIs like Carbex, Eldepryl, Marplan, Nardil, and Parnate This medicine may  also interact with the following medications: -alcohol or medicines that contain alcohol -antihistamines -benzodiazepines -bupropion -carbamazepine or oxcarbazepine -clozapine -cyclobenzaprine -digoxin -furazolidone -linezolid -medicines for depression, anxiety, or psychotic disturbances -medicines for migraine headache like almotriptan, eletriptan, frovatriptan, naratriptan, rizatriptan, sumatriptan, zolmitriptan -medicines for pain like pentazocine, buprenorphine, butorphanol,  meperidine, nalbuphine, and propoxyphene -medicines for sleep -muscle relaxants -naltrexone -phenobarbital -phenothiazines like perphenazine, thioridazine, chlorpromazine, mesoridazine, fluphenazine, prochlorperazine, promazine, and trifluoperazine -procarbazine -warfarin This list may not describe all possible interactions. Give your health care provider a list of all the medicines, herbs, non-prescription drugs, or dietary supplements you use. Also tell them if you smoke, drink alcohol, or use illegal drugs. Some items may interact with your medicine. What should I watch for while using this medicine? Tell your doctor or health care professional if your pain does not go away, if it gets worse, or if you have new or a different type of pain. You may develop tolerance to the medicine. Tolerance means that you will need a higher dose of the medicine for pain relief. Tolerance is normal and is expected if you take this medicine for a long time. Do not suddenly stop taking your medicine because you may develop a severe reaction. Your body becomes used to the medicine. This does NOT mean you are addicted. Addiction is a behavior related to getting and using a drug for a non-medical reason. If you have pain, you have a medical reason to take pain medicine. Your doctor will tell you how much medicine to take. If your doctor wants you to stop the medicine, the dose will be slowly lowered over time to avoid any side effects. You may get drowsy or dizzy. Do not drive, use machinery, or do anything that needs mental alertness until you know how this medicine affects you. Do not stand or sit up quickly, especially if you are an older patient. This reduces the risk of dizzy or fainting spells. Alcohol can increase or decrease the effects of this medicine. Avoid alcoholic drinks. You may have constipation. Try to have a bowel movement at least every 2 to 3 days. If you do not have a bowel movement for 3 days, call  your doctor or health care professional. Your mouth may get dry. Chewing sugarless gum or sucking hard candy, and drinking plenty of water may help. Contact your doctor if the problem does not go away or is severe. What side effects may I notice from receiving this medicine? Side effects that you should report to your doctor or health care professional as soon as possible: -allergic reactions like skin rash, itching or hives, swelling of the face, lips, or tongue -breathing difficulties, wheezing -confusion -itching -light headedness or fainting spells -redness, blistering, peeling or loosening of the skin, including inside the mouth -seizures Side effects that usually do not require medical attention (report to your doctor or health care professional if they continue or are bothersome): -constipation -dizziness -drowsiness -headache -nausea, vomiting This list may not describe all possible side effects. Call your doctor for medical advice about side effects. You may report side effects to FDA at 1-800-FDA-1088. Where should I keep my medicine? Keep out of the reach of children. This medicine may cause accidental overdose and death if it taken by other adults, children, or pets. Mix any unused medicine with a substance like cat litter or coffee grounds. Then throw the medicine away in a sealed container like a sealed bag or a coffee can with a lid. Do not use  the medicine after the expiration date. Store at room temperature between 15 and 30 degrees C (59 and 86 degrees F). NOTE: This sheet is a summary. It may not cover all possible information. If you have questions about this medicine, talk to your doctor, pharmacist, or health care provider.    2016, [employer] Standard. (2013-05-02 15:42:09)

## 2015-11-05 NOTE — Discharge Summary (Signed)
Physician Discharge Summary  Patient ID: Brandy Paul MRN: [id number] DOB/AGE: October 26, 1945 70 y.o.  Admit date: 11/04/2015 Discharge date: 11/05/2015  Admission Diagnoses: Ovarian mass, left  Discharge Diagnoses:  Principal Problem:   Ovarian mass, left Active Problems:   Pelvic mass in female   Discharged Condition:  The patient is in good condition and stable for discharge.   Hospital Course: On 11/04/2015, the patient underwent the following: Procedure(s): XI ROBOTIC ASSISTED BILATERAL SALPINGO OOPHORECTOMY.   The postoperative course was uneventful.  She was discharged to home on postoperative day 1 tolerating a regular diet, minimal pain, voiding.  Consults: None  Significant Diagnostic Studies: None  Treatments: surgery: see above  Discharge Exam: Blood pressure (!) 122/51, pulse 75, temperature 99.1 F (37.3 C), temperature source Oral, resp. rate 16, height 5\' 3"  (1.6 m), weight 214 lb 15.2 oz (97.5 kg), SpO2 98 %. General appearance: alert, cooperative and no distress Resp: clear to auscultation bilaterally Cardio: regular rate and rhythm, S1, S2 normal, no murmur, click, rub or gallop GI: soft, non-tender; bowel sounds normal; no masses,  no organomegaly Extremities: extremities normal, atraumatic, no cyanosis or edema Incision/Wound: Lap sites to the abdomen with dermabond without erythema or drainage  Disposition: 01-Home or Self Care  Discharge Instructions    Call MD for:  difficulty breathing, headache or visual disturbances    Complete by:  As directed   Call MD for:  extreme fatigue    Complete by:  As directed   Call MD for:  hives    Complete by:  As directed   Call MD for:  persistant dizziness or light-headedness    Complete by:  As directed   Call MD for:  persistant nausea and vomiting    Complete by:  As directed   Call MD for:  redness, tenderness, or signs of infection (pain, swelling, redness, odor or green/yellow discharge around incision site)     Complete by:  As directed   Call MD for:  severe uncontrolled pain    Complete by:  As directed   Call MD for:  temperature >100.4    Complete by:  As directed   Diet - low sodium heart healthy    Complete by:  As directed   Increase activity slowly    Complete by:  As directed   Lifting restrictions    Complete by:  As directed   No lifting greater than 10 lbs.   Sexual Activity Restrictions    Complete by:  As directed   No sexual activity, nothing in the vagina, for 6 weeks.       Medication List    TAKE these medications   aspirin 81 MG tablet Take 81 mg by mouth daily.   atorvastatin 20 MG tablet Commonly known as:  LIPITOR Take 20 mg by mouth at bedtime.   clopidogrel 75 MG tablet Commonly known as:  PLAVIX Take 1 tablet (75 mg total) by mouth at bedtime. Start taking on:  11/07/2015   diphenhydrAMINE 25 mg capsule Commonly known as:  BENADRYL Take 25 mg by mouth every 6 (six) hours as needed for itching or allergies.   ezetimibe 10 MG tablet Commonly known as:  ZETIA Take 10 mg by mouth at bedtime.   isosorbide mononitrate 60 MG 24 hr tablet Commonly known as:  IMDUR TAKE 1 TABLET BY MOUTH EVERY DAY   ketoconazole 2 % cream Commonly known as:  NIZORAL APP ON THE SKIN BID PRF ABDOMINAL RASH  MULTIVITAMIN PO Take 1 tablet by mouth daily.   nitroGLYCERIN 0.4 MG SL tablet Commonly known as:  NITROSTAT Place 1 tablet (0.4 mg total) under the tongue every 5 (five) minutes as needed. What changed:  reasons to take this   OSCAL 500/200 D-3 PO Take 1 tablet by mouth daily.   RABEprazole 20 MG tablet Commonly known as:  ACIPHEX Take 20 mg by mouth at bedtime.   sertraline 25 MG tablet Commonly known as:  ZOLOFT Take 50 mg by mouth at bedtime.   SPIRIVA HANDIHALER 18 MCG inhalation capsule Generic drug:  tiotropium Place 18 mcg into inhaler and inhale at bedtime. And prn   traMADol 50 MG tablet Commonly known as:  ULTRAM Take 1 tablet (50 mg total)  by mouth every 6 (six) hours as needed for moderate pain or severe pain.      Follow-up Information    [name], MD Follow up on 11/29/2015.   Specialty:  Obstetrics and Gynecology Why:  at 12:00pm at the [facility] information: [place] [address] [phone]           Greater than thirty minutes were spend for face to face discharge instructions and discharge orders/summary in EPIC.   Signed: CROSS, MELISSA DEAL 11/05/2015, 9:40 AM

## 2015-11-09 ENCOUNTER — Telehealth: Payer: Self-pay

## 2015-11-09 NOTE — Telephone Encounter (Signed)
Orders received from [facility] to contact the patient to update with surgical pathology report , " No cancer found on final pathology report". Contacted the patient , patient was resting , spoke with Brandy Paul. Brandy Paul states understanding , states his wife is doing well postoperatively . No further questions asked at this  time.

## 2015-11-29 ENCOUNTER — Telehealth: Payer: Self-pay | Admitting: Gastroenterology

## 2015-11-29 ENCOUNTER — Ambulatory Visit: Payer: Medicare Other | Attending: Gynecologic Oncology | Admitting: Gynecologic Oncology

## 2015-11-29 ENCOUNTER — Encounter: Payer: Self-pay | Admitting: Gynecologic Oncology

## 2015-11-29 VITALS — BP 147/83 | HR 86 | Temp 98.3°F | Resp 18 | Ht 63.0 in | Wt 203.3 lb

## 2015-11-29 DIAGNOSIS — N83202 Unspecified ovarian cyst, left side: Secondary | ICD-10-CM | POA: Insufficient documentation

## 2015-11-29 DIAGNOSIS — D271 Benign neoplasm of left ovary: Secondary | ICD-10-CM

## 2015-11-29 DIAGNOSIS — D4959 Neoplasm of unspecified behavior of other genitourinary organ: Secondary | ICD-10-CM

## 2015-11-29 DIAGNOSIS — Z90722 Acquired absence of ovaries, bilateral: Secondary | ICD-10-CM | POA: Insufficient documentation

## 2015-11-29 DIAGNOSIS — Z9889 Other specified postprocedural states: Secondary | ICD-10-CM | POA: Insufficient documentation

## 2015-11-29 DIAGNOSIS — D270 Benign neoplasm of right ovary: Secondary | ICD-10-CM

## 2015-11-29 NOTE — Patient Instructions (Signed)
No follow up necessary.  Please call for any questions or concerns. 

## 2015-11-29 NOTE — Progress Notes (Signed)
POSTOPERATIVE FOLLOWUP  HPI:  Brandy Paul is a 70 y.o. year old No obstetric history on file. initially seen in consultation on 10/25/15 for left ovarian cyst.  She then underwent a robotic assisted BSO and lysis of adhesions on [id number] without complications.  Her postoperative course was uncomplicated.  Her final pathology revealed a benign cystadenofibroma on both ovaries.  She is seen today for a postoperative check and to discuss her pathology results and ongoing plan.  Since discharge from the hospital, she is feeling well.  She has improving appetite, normal bowel and bladder function, and pain controlled with minimal PO medication. She has no other complaints today.    Review of systems: Constitutional:  She has no weight gain or weight loss. She has no fever or chills. Eyes: No blurred vision Ears, Nose, Mouth, Throat: No dizziness, headaches or changes in hearing. No mouth sores. Cardiovascular: No chest pain, palpitations or edema. Respiratory:  No shortness of breath, wheezing or cough Gastrointestinal: She has normal bowel movements without diarrhea or constipation. She denies any nausea or vomiting. She denies blood in her stool or heart burn. Genitourinary:  She denies pelvic pain, pelvic pressure or changes in her urinary function. She has no hematuria, dysuria, or incontinence. She has no irregular vaginal bleeding or vaginal discharge Musculoskeletal: Denies muscle weakness or joint pains.  Skin:  She has no skin changes, rashes or itching Neurological:  Denies dizziness or headaches. No neuropathy, no numbness or tingling. Psychiatric:  She denies depression or anxiety. Hematologic/Lymphatic:   No easy bruising or bleeding   Physical Exam: Blood pressure (!) 147/83, pulse 86, temperature 98.3 F (36.8 C), temperature source Oral, resp. rate 18, height 5\' 3"  (1.6 m), weight 203 lb 4.8 oz (92.2 kg), SpO2 97 %. General: Well dressed, well nourished in no apparent distress.    HEENT:  Normocephalic and atraumatic, no lesions.  Extraocular muscles intact. Sclerae anicteric. Pupils equal, round, reactive. No mouth sores or ulcers. Thyroid is normal size, not nodular, midline. Skin:  No lesions or rashes. Breasts:  Soft, symmetric.  No skin or nipple changes.  No palpable LN or masses. Lungs:  Clear to auscultation bilaterally.  No wheezes. Cardiovascular:  Regular rate and rhythm.  No murmurs or rubs. Abdomen:  Soft, nontender, nondistended.  No palpable masses.  No hepatosplenomegaly.  No ascites. Normal bowel sounds.  No hernias.  Incisions are well healed  Genitourinary: Normal EGBUS  Vaginal cuff intact.  No bleeding or discharge.  No cul de sac fullness. Extremities: No cyanosis, clubbing or edema.  No calf tenderness or erythema. No palpable cords. Psychiatric: Mood and affect are appropriate. Neurological: Awake, alert and oriented x 3. Sensation is intact, no neuropathy.  Musculoskeletal: No pain, normal strength and range of motion.  Assessment:    70 y.o. year old with a history of a left ovarian cyst.   S/p robotic assisted bilateral ovarian cystectomy on 11/04/15.   Plan: 1) Pathology reports reviewed today 2) Treatment counseling - I discussed the benign nature of her pathology. No intervention is necessary She was given the opportunity to ask questions, which were answered to her satisfaction, and she is agreement with the above mentioned plan of care.  3)  Return to clinic on a prn basis. She will continue to follow-up with her PCP Dr Ardeth Perfect as needed for care  [name], MD

## 2015-11-29 NOTE — Telephone Encounter (Signed)
She is to be admitted. Confirmed with bed control that the patient is on the list. Patient's procedure is scheduled for 12/03/15

## 2015-12-01 ENCOUNTER — Encounter (HOSPITAL_COMMUNITY): Payer: Self-pay | Admitting: *Deleted

## 2015-12-01 ENCOUNTER — Inpatient Hospital Stay (HOSPITAL_COMMUNITY)
Admission: AD | Admit: 2015-12-01 | Discharge: 2015-12-03 | DRG: 812 | Disposition: A | Discharge status: Home / Self Care | Payer: Medicare Other | Source: Ambulatory Visit | Attending: Gastroenterology | Admitting: Gastroenterology

## 2015-12-01 DIAGNOSIS — Z853 Personal history of malignant neoplasm of breast: Secondary | ICD-10-CM

## 2015-12-01 DIAGNOSIS — Z823 Family history of stroke: Secondary | ICD-10-CM | POA: Diagnosis not present

## 2015-12-01 DIAGNOSIS — D509 Iron deficiency anemia, unspecified: Principal | ICD-10-CM | POA: Diagnosis present

## 2015-12-01 DIAGNOSIS — K648 Other hemorrhoids: Secondary | ICD-10-CM | POA: Diagnosis present

## 2015-12-01 DIAGNOSIS — D649 Anemia, unspecified: Secondary | ICD-10-CM | POA: Diagnosis present

## 2015-12-01 DIAGNOSIS — Z9012 Acquired absence of left breast and nipple: Secondary | ICD-10-CM

## 2015-12-01 DIAGNOSIS — Z8249 Family history of ischemic heart disease and other diseases of the circulatory system: Secondary | ICD-10-CM

## 2015-12-01 DIAGNOSIS — E785 Hyperlipidemia, unspecified: Secondary | ICD-10-CM | POA: Diagnosis present

## 2015-12-01 DIAGNOSIS — Z87891 Personal history of nicotine dependence: Secondary | ICD-10-CM

## 2015-12-01 DIAGNOSIS — Z881 Allergy status to other antibiotic agents status: Secondary | ICD-10-CM | POA: Diagnosis not present

## 2015-12-01 DIAGNOSIS — D124 Benign neoplasm of descending colon: Secondary | ICD-10-CM | POA: Diagnosis present

## 2015-12-01 DIAGNOSIS — I69331 Monoplegia of upper limb following cerebral infarction affecting right dominant side: Secondary | ICD-10-CM | POA: Diagnosis not present

## 2015-12-01 DIAGNOSIS — Z7982 Long term (current) use of aspirin: Secondary | ICD-10-CM | POA: Diagnosis not present

## 2015-12-01 DIAGNOSIS — D123 Benign neoplasm of transverse colon: Secondary | ICD-10-CM | POA: Diagnosis present

## 2015-12-01 DIAGNOSIS — Z90710 Acquired absence of both cervix and uterus: Secondary | ICD-10-CM | POA: Diagnosis not present

## 2015-12-01 DIAGNOSIS — K5730 Diverticulosis of large intestine without perforation or abscess without bleeding: Secondary | ICD-10-CM | POA: Diagnosis present

## 2015-12-01 DIAGNOSIS — I2510 Atherosclerotic heart disease of native coronary artery without angina pectoris: Secondary | ICD-10-CM | POA: Diagnosis present

## 2015-12-01 DIAGNOSIS — Z833 Family history of diabetes mellitus: Secondary | ICD-10-CM | POA: Diagnosis not present

## 2015-12-01 DIAGNOSIS — R5381 Other malaise: Secondary | ICD-10-CM | POA: Diagnosis present

## 2015-12-01 DIAGNOSIS — F329 Major depressive disorder, single episode, unspecified: Secondary | ICD-10-CM | POA: Diagnosis present

## 2015-12-01 DIAGNOSIS — R195 Other fecal abnormalities: Secondary | ICD-10-CM | POA: Diagnosis not present

## 2015-12-01 DIAGNOSIS — Z79899 Other long term (current) drug therapy: Secondary | ICD-10-CM

## 2015-12-01 DIAGNOSIS — K635 Polyp of colon: Secondary | ICD-10-CM | POA: Diagnosis not present

## 2015-12-01 DIAGNOSIS — J449 Chronic obstructive pulmonary disease, unspecified: Secondary | ICD-10-CM | POA: Diagnosis present

## 2015-12-01 DIAGNOSIS — K219 Gastro-esophageal reflux disease without esophagitis: Secondary | ICD-10-CM | POA: Diagnosis present

## 2015-12-01 DIAGNOSIS — M810 Age-related osteoporosis without current pathological fracture: Secondary | ICD-10-CM | POA: Diagnosis present

## 2015-12-01 LAB — PROTIME-INR
INR: 0.88
Prothrombin Time: 11.9 seconds (ref 11.4–15.2)

## 2015-12-01 LAB — COMPREHENSIVE METABOLIC PANEL
ALBUMIN: 3.2 g/dL — AB (ref 3.5–5.0)
ALT: 11 U/L — ABNORMAL LOW (ref 14–54)
ANION GAP: 6 (ref 5–15)
AST: 16 U/L (ref 15–41)
Alkaline Phosphatase: 79 U/L (ref 38–126)
BUN: 20 mg/dL (ref 6–20)
CO2: 27 mmol/L (ref 22–32)
Calcium: 8.8 mg/dL — ABNORMAL LOW (ref 8.9–10.3)
Chloride: 108 mmol/L (ref 101–111)
Creatinine, Ser: 0.78 mg/dL (ref 0.44–1.00)
GFR calc non Af Amer: >60 mL/min (ref >60)
GLUCOSE: 136 mg/dL — AB (ref 65–99)
POTASSIUM: 3.7 mmol/L (ref 3.5–5.1)
SODIUM: 141 mmol/L (ref 135–145)
TOTAL PROTEIN: 6.7 g/dL (ref 6.5–8.1)
Total Bilirubin: 0.4 mg/dL (ref 0.3–1.2)

## 2015-12-01 LAB — CBC
HEMATOCRIT: 34.2 % — AB (ref 36.0–46.0)
HEMOGLOBIN: 10.2 g/dL — AB (ref 12.0–15.0)
MCH: 23.6 pg — AB (ref 26.0–34.0)
MCHC: 29.8 g/dL — AB (ref 30.0–36.0)
MCV: 79.2 fL (ref 78.0–100.0)
Platelets: 210 10*3/uL (ref 150–400)
RBC: 4.32 MIL/uL (ref 3.87–5.11)
RDW: 16.7 % — ABNORMAL HIGH (ref 11.5–15.5)
WBC: 6.9 10*3/uL (ref 4.0–10.5)

## 2015-12-01 MED ORDER — MAGNESIUM CITRATE PO SOLN
1.0000 | Freq: Once | ORAL | Status: AC
  Administered 2015-12-01: 1 via ORAL
  Filled 2015-12-01: qty 296

## 2015-12-01 MED ORDER — ATORVASTATIN CALCIUM 10 MG PO TABS
20.0000 mg | ORAL_TABLET | Freq: Every day | ORAL | Status: DC
  Administered 2015-12-01: 20 mg via ORAL
  Filled 2015-12-01: qty 2

## 2015-12-01 MED ORDER — PEG-KCL-NACL-NASULF-NA ASC-C 100 G PO SOLR
0.5000 | Freq: Once | ORAL | Status: AC
  Administered 2015-12-02: 100 g via ORAL
  Filled 2015-12-01: qty 1

## 2015-12-01 MED ORDER — ISOSORBIDE MONONITRATE ER 60 MG PO TB24
60.0000 mg | ORAL_TABLET | Freq: Every day | ORAL | Status: DC
  Administered 2015-12-01 – 2015-12-03 (×3): 60 mg via ORAL
  Filled 2015-12-01 (×3): qty 1

## 2015-12-01 MED ORDER — SODIUM CHLORIDE 0.9% FLUSH
3.0000 mL | Freq: Two times a day (BID) | INTRAVENOUS | Status: DC
  Administered 2015-12-01 – 2015-12-02 (×3): 3 mL via INTRAVENOUS

## 2015-12-01 MED ORDER — ACETAMINOPHEN 325 MG PO TABS: 650.0000 mg | ORAL_TABLET | Freq: Four times a day (QID) | ORAL | Status: DC | PRN

## 2015-12-01 MED ORDER — PANTOPRAZOLE SODIUM 40 MG PO TBEC
40.0000 mg | DELAYED_RELEASE_TABLET | Freq: Every day | ORAL | Status: DC
  Administered 2015-12-01 – 2015-12-03 (×3): 40 mg via ORAL
  Filled 2015-12-01 (×3): qty 1

## 2015-12-01 MED ORDER — ONDANSETRON HCL 4 MG PO TABS: 4.0000 mg | ORAL_TABLET | Freq: Four times a day (QID) | ORAL | Status: DC | PRN

## 2015-12-01 MED ORDER — SODIUM CHLORIDE 0.9% FLUSH: 3.0000 mL | INTRAVENOUS | Status: DC | PRN

## 2015-12-01 MED ORDER — CALCIUM CARBONATE ANTACID 500 MG PO CHEW
2.0000 | CHEWABLE_TABLET | Freq: Three times a day (TID) | ORAL | Status: DC | PRN
  Administered 2015-12-01: 400 mg via ORAL
  Filled 2015-12-01: qty 2

## 2015-12-01 MED ORDER — EZETIMIBE 10 MG PO TABS
10.0000 mg | ORAL_TABLET | Freq: Every day | ORAL | Status: DC
  Administered 2015-12-01 – 2015-12-03 (×3): 10 mg via ORAL
  Filled 2015-12-01 (×3): qty 1

## 2015-12-01 MED ORDER — SERTRALINE HCL 50 MG PO TABS
50.0000 mg | ORAL_TABLET | Freq: Every day | ORAL | Status: DC
  Administered 2015-12-01 – 2015-12-03 (×3): 50 mg via ORAL
  Filled 2015-12-01 (×3): qty 1

## 2015-12-01 MED ORDER — PEG-KCL-NACL-NASULF-NA ASC-C 100 G PO SOLR: 1.0000 | Freq: Once | ORAL | Status: DC

## 2015-12-01 MED ORDER — TIOTROPIUM BROMIDE MONOHYDRATE 18 MCG IN CAPS
18.0000 ug | ORAL_CAPSULE | Freq: Every day | RESPIRATORY_TRACT | Status: DC
  Administered 2015-12-02 – 2015-12-03 (×2): 18 ug via RESPIRATORY_TRACT
  Filled 2015-12-01: qty 5

## 2015-12-01 MED ORDER — ONDANSETRON HCL 4 MG/2ML IJ SOLN: 4.0000 mg | Freq: Four times a day (QID) | INTRAMUSCULAR | Status: DC | PRN

## 2015-12-01 MED ORDER — TRAMADOL HCL 50 MG PO TABS
50.0000 mg | ORAL_TABLET | Freq: Four times a day (QID) | ORAL | Status: DC | PRN
Start: 2015-12-01 — End: 2015-12-03

## 2015-12-01 MED ORDER — ACETAMINOPHEN 650 MG RE SUPP: 650.0000 mg | Freq: Four times a day (QID) | RECTAL | Status: DC | PRN

## 2015-12-01 MED ORDER — SODIUM CHLORIDE 0.9 % IV SOLN: 250.0000 mL | INTRAVENOUS | Status: DC | PRN

## 2015-12-01 MED ORDER — ASPIRIN 81 MG PO CHEW
81.0000 mg | CHEWABLE_TABLET | Freq: Every day | ORAL | Status: DC
  Administered 2015-12-01 – 2015-12-02 (×2): 81 mg via ORAL
  Filled 2015-12-01 (×3): qty 1

## 2015-12-01 NOTE — H&P (Signed)
Admission Note  Primary Care Physician:  Velna Hatchet, MD Primary Gastroenterologist:   Dr Silverio Decamp  MD.  HPI: Brandy Paul is a 70 y.o. female  known to Dr. Silverio Decamp who was initially referred to our office earlier this year for evaluation of iron deficiency anemia and a positive lCologuard. Patient has history of a CVA 2013, with residual right upper extremity weakness and is maintained on Plavix. Other medical problems include history of breast cancer, COPD, GERD, Paget's disease of the breast and hyperlipidemia. Her husband initially expressed concern about her ability to prep at home and it was eventually arranged for her to be admitted to undergo bowel prep and colonoscopy. Patient was admitted in February 2017 and underwent overnight bowel prep. However when a colonoscopy was attempted the prep was totally inadequate. She is rescheduled at this time for admission for prolonged bowel prep and plans for colonoscopy on 12/03/2015. Patient has been off Plavix since 11/27/2015. She says that she has chronic problems with constipation but has not been using any regular laxatives. Her husband says most days she does have some sort of small bowel movement and then every several days will ask larger amount of stool. She denies any abdominal pain. Denies any dysphagia or odynophagia or nausea. Her husband says that the only time they have seen blood in her stool is from known external hemorrhoids.   Past Medical History:  Diagnosis Date  . Breast cancer ([facility]) 2005   left  . COPD (chronic obstructive pulmonary disease) ([facility])    hx of tobacco use  . Coronary artery spasm ([place])   . Depression   . GERD (gastroesophageal reflux disease)   . Hyperlipemia   . IDA (iron deficiency anemia)    last iron infusion 4 months ago  . Osteoporosis   . 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 Prediabetes    hx of  . Rash    under right breast and groin line using nizoral ointment to q day per  dermatology saw few weeks ago  . Stress incontinence wers depends  . Stroke [facility]) 2013   paralytic syndrome of dominant right side  . Toe fracture, left     Past Surgical History:  Procedure Laterality Date  . ABDOMINAL HYSTERECTOMY     complete  . MASTECTOMY Left    1 lymph node removed  . RADIAL KERATOTOMY Bilateral   . ROBOTIC ASSISTED BILATERAL SALPINGO OOPHERECTOMY Bilateral 11/04/2015   Procedure: XI ROBOTIC ASSISTED BILATERAL SALPINGO OOPHORECTOMY;  Surgeon: Everitt Amber, MD;  Location: [facility] ORS;  Service: Gynecology;  Laterality: Bilateral;  . TONSILLECTOMY      Prior to Admission medications   Medication Sig Start Date End Date Taking? Authorizing Provider  aspirin 81 MG tablet Take 81 mg by mouth every evening.    Yes Historical Provider, MD  atorvastatin (LIPITOR) 20 MG tablet Take 20 mg by mouth at bedtime.  10/31/14  Yes Historical Provider, MD  Calcium Carbonate-Vitamin D (OSCAL 500/200 D-3 PO) Take 1 tablet by mouth every evening.    Yes Historical Provider, MD  diphenhydrAMINE (BENADRYL) 25 mg capsule Take 25 mg by mouth every 6 (six) hours as needed for itching or allergies.   Yes Historical Provider, MD  ezetimibe (ZETIA) 10 MG tablet Take 10 mg by mouth at bedtime.    Yes Historical Provider, MD  isosorbide mononitrate (IMDUR) 60 MG 24 hr tablet TAKE 1 TABLET BY MOUTH EVERY DAY 09/30/15  Yes Belva Crome, MD  ketoconazole (NIZORAL) 2 % cream APPly topically ON THE SKIN BID as needed for ABDOMINAL RASH 09/07/15  Yes Historical Provider, MD  Multiple Vitamins-Minerals (MULTIVITAMIN PO) Take 1 tablet by mouth every evening.    Yes Historical Provider, MD  nitroGLYCERIN (NITROSTAT) 0.4 MG SL tablet Place 1 tablet (0.4 mg total) under the tongue every 5 (five) minutes as needed. Patient taking differently: Place 0.4 mg under the tongue every 5 (five) minutes as needed for chest pain.  11/24/14  Yes Belva Crome, MD  RABEprazole (ACIPHEX) 20 MG tablet Take 20 mg by mouth at  bedtime.  10/29/14  Yes Historical Provider, MD  sertraline (ZOLOFT) 25 MG tablet Take 50 mg by mouth at bedtime.  11/17/14  Yes Historical Provider, MD  tiotropium (SPIRIVA HANDIHALER) 18 MCG inhalation capsule Place 18 mcg into inhaler and inhale daily.    Yes Historical Provider, MD  clopidogrel (PLAVIX) 75 MG tablet Take 1 tablet (75 mg total) by mouth at bedtime. Patient not taking: Reported on 11/29/2015 11/07/15   Dorothyann Gibbs, NP  traMADol (ULTRAM) 50 MG tablet Take 1 tablet (50 mg total) by mouth every 6 (six) hours as needed for moderate pain or severe pain. Patient not taking: Reported on 12/01/2015 11/05/15   Dorothyann Gibbs, NP    Current Facility-Administered Medications  Medication Dose Route Frequency Provider Last Rate Last Dose  . 0.9 %  sodium chloride infusion  250 mL Intravenous PRN Amy S Esterwood, PA-C      . acetaminophen (TYLENOL) tablet 650 mg  650 mg Oral Q6H PRN Amy S Esterwood, PA-C       Or  . acetaminophen (TYLENOL) suppository 650 mg  650 mg Rectal Q6H PRN Amy S Esterwood, PA-C      . aspirin chewable tablet 81 mg  81 mg Oral Daily Amy S Esterwood, PA-C      . atorvastatin (LIPITOR) tablet 20 mg  20 mg Oral q1800 Amy S Esterwood, PA-C      . ezetimibe (ZETIA) tablet 10 mg  10 mg Oral Daily Amy S Esterwood, PA-C      . isosorbide mononitrate (IMDUR) 24 hr tablet 60 mg  60 mg Oral Daily Amy S Esterwood, PA-C      . magnesium citrate solution 1 Bottle  1 Bottle Oral Once Amy S Esterwood, PA-C      . ondansetron (ZOFRAN) tablet 4 mg  4 mg Oral Q6H PRN Amy S Esterwood, PA-C       Or  . ondansetron (ZOFRAN) injection 4 mg  4 mg Intravenous Q6H PRN Amy S Esterwood, PA-C      . pantoprazole (PROTONIX) EC tablet 40 mg  40 mg Oral Q0600 Amy S Esterwood, PA-C      . [START ON 12/02/2015] peg 3350 powder (MOVIPREP) kit 100 g  0.5 kit Oral Once Amy S Esterwood, PA-C       And  . [START ON 12/02/2015] peg 3350 powder (MOVIPREP) kit 100 g  0.5 kit Oral Once Amy S Esterwood, PA-C       . sertraline (ZOLOFT) tablet 50 mg  50 mg Oral Daily Amy S Esterwood, PA-C      . sodium chloride flush (NS) 0.9 % injection 3 mL  3 mL Intravenous Q12H Amy S Esterwood, PA-C      . sodium chloride flush (NS) 0.9 % injection 3 mL  3 mL Intravenous PRN Amy S Esterwood, PA-C      . tiotropium (SPIRIVA) inhalation capsule  18 mcg  18 mcg Inhalation Daily Amy S Esterwood, PA-C      . traMADol (ULTRAM) tablet 50 mg  50 mg Oral Q6H PRN Amy S Esterwood, PA-C        Allergies as of 10/19/2015 - Review Complete 10/15/2015  Allergen Reaction Noted  . Ceclor [cefaclor] Other (See Comments) 03/10/2011    Family History  Problem Relation Age of Onset  . CVA Mother   . Liver cancer Mother   . Heart attack Sister   . Stroke Maternal Grandfather   . Hypertension Sister   . Diabetes Sister   . Arthritis Sister     Social History   Social History  . Marital status: Married    Spouse name: N/A  . Number of children: 2  . Years of education: N/A   Occupational History  . retired    Social History Main Topics  . Smoking status: Former Smoker    Types: Cigarettes    Quit date: 02/17/1994  . Smokeless tobacco: Never Used  . Alcohol use No  . Drug use: No  . Sexual activity: No   Other Topics Concern  . Not on file   Social History Narrative  . No narrative on file    Review of Systems:  All systems reviewed an negative except where noted in HPI.   Physical Exam: Vital signs in last 24 hours: Temp:  [98.7 F (37.1 C)] 98.7 F (37.1 C) (09/13 1057) Pulse Rate:  [82] 82 (09/13 1057) Resp:  [18] 18 (09/13 1057) BP: (133)/(56) 133/56 (09/13 1057) SpO2:  [82 %] 82 % (09/13 1057)   General:  Pleasant, well-developed, older white female in NAD Head:  Normocephalic and atraumatic. Eyes:  Sclera clear, no icterus.   Conjunctiva pink. Ears:  Normal auditory acuity. Mouth:  No deformity or lesions.,slight facial droop on right  Neck:  Supple; no masses . Lungs:  Clear  throughout to auscultation.   No wheezes, crackles, or rhonchi. No acute distress. Heart:  Regular rate and rhythm; no murmurs. Abdomen:  Soft,large , nondistended, nontender. No masses, hepatomegaly. No obvious masses.  Normal bowel .    Rectal:  Not done   Msk:  Symmetrical without gross deformities.. Pulses:  Normal pulses noted. Extremities:  Without edema. Neurologic:  Alert and  oriented x4; weakness RUE Skin:  Intact without significant lesions or rashes. Cervical Nodes:  No significant cervical adenopathy. Psych:  Alert and cooperative. Normal mood and affect.  Lab Results:  Recent Labs  12/01/15 1228  WBC 6.9  HGB 10.2*  HCT 34.2*  PLT 210   BMET  Recent Labs  12/01/15 1228  NA 141  K 3.7  CL 108  CO2 27  GLUCOSE 136*  BUN 20  CREATININE 0.78  CALCIUM 8.8*   LFT  Recent Labs  12/01/15 1228  PROT 6.7  ALBUMIN 3.2*  AST 16  ALT 11*  ALKPHOS 79  BILITOT 0.4   PT/INR  Recent Labs  12/01/15 1228  LABPROT 11.9  INR 0.88   Hepatitis Panel No results for input(s): HEPBSAG, HCVAB, HEPAIGM, HEPBIGM in the last 72 hours.  Studies/Results: No results found.  Impression / Plan:  #70 70 year old white female admitted for prolonged bowel prep and planned colonoscopy for evaluation of iron deficiency anemia and positive ColoGuard . Patient is debilitated post-CVA and unable to prep at home. She also had 1 previous overnight admission for bowel prep which was inadequate. #2 status post CVA 2013 with residual right-sided  weakness #3 chronic antiplatelet therapy with Plavix #4 history of breast cancer #5 hyperlipidemia  Plan; full liquid diet Start bowel prep today with magnesium citrate Movi prep starting tomorrow morning, and can give additional prep tomorrow pending results from magnesium citrate. Colonoscopy as scheduled for Friday, 12/03/2015 with Dr. Silverio Decamp at 12:30 PM. Procedure was discussed again with patient and her husband including risks and  benefits and they're agreeable to proceed. Patient has been off Plavix over the past 5 days.      LOS: 0 days   Amy Esterwood  12/01/2015, 3:06 PM

## 2015-12-01 NOTE — Progress Notes (Signed)
Patient arrived from home to [facility] and is admitted to room 1519.  Husband at bedside.  Patient is alert and oriented x 4, no acute distress, denies pain.  Patient oriented to unit, room, staff, and call bell.  Patient assessed, vitals obtained.  Amy Easterwood, PA is on unit and notified of patient arrival.

## 2015-12-02 DIAGNOSIS — D649 Anemia, unspecified: Secondary | ICD-10-CM | POA: Diagnosis present

## 2015-12-02 MED ORDER — PEG-KCL-NACL-NASULF-NA ASC-C 100 G PO SOLR
1.0000 | Freq: Once | ORAL | Status: AC
  Administered 2015-12-02: 200 g via ORAL
  Filled 2015-12-02: qty 1

## 2015-12-02 NOTE — Progress Notes (Signed)
Patient ID: Brandy Paul, female   DOB: [birth year], 70 y.o.   MRN: [id number]    Progress Note   Subjective   Pt says she had good results with Mag citrate last pm - multiple Bm's . Taking Moviprep currently. No complaints   Objective   Vital signs in last 24 hours: Temp:  [98.4 F (36.9 C)-98.7 F (37.1 C)] 98.4 F (36.9 C) (09/14 0547) Pulse Rate:  [72-82] 72 (09/14 0547) Resp:  [18-19] 19 (09/14 0547) BP: (122-137)/(56-66) 122/66 (09/14 0547) SpO2:  [82 %-96 %] 95 % (09/14 0933) Last BM Date: 12/01/15 General:   Elderly  white female in NAD Heart:  Regular rate and rhythm; no murmurs Lungs: Respirations even and unlabored, lungs CTA bilaterally Abdomen:  Soft, nontender and nondistended. Normal bowel sounds. Extremities:  Without edema.right UE some wasting Neurologic:  Alert and oriented,  grossly normal neurologically. Psych:  Cooperative. Normal mood and affect.  Intake/Output from previous day: 09/13 0701 - 09/14 0700 In: 540 [P.O.:540] Out: -  Intake/Output this shift: No intake/output data recorded.  Lab Results:  Recent Labs  12/01/15 1228  WBC 6.9  HGB 10.2*  HCT 34.2*  PLT 210   BMET  Recent Labs  12/01/15 1228  NA 141  K 3.7  CL 108  CO2 27  GLUCOSE 136*  BUN 20  CREATININE 0.78  CALCIUM 8.8*   LFT  Recent Labs  12/01/15 1228  PROT 6.7  ALBUMIN 3.2*  AST 16  ALT 11*  ALKPHOS 79  BILITOT 0.4   PT/INR  Recent Labs  12/01/15 1228  LABPROT 11.9  INR 0.88    Studies/Results: No results found.     Assessment / Plan:    #1 70 yo female s/p CVA /debilitated with right sided weakness, admitted with Fe deficiency  anemia and a positive Cologuard  For extended  bowel prep , and Colonoscopy tomorrow   Plan; Continue bowel prep today, will give additional liter of moviprep  If needed. Off plavix since 11/26/2015 Colonoscopy scheduled for 12:30 tomorrow with Dr Silverio Decamp  Active Problems:   Iron deficiency anemia    Anemia     LOS: 1 day   Brandy Paul  12/02/2015, 10:37 AM

## 2015-12-02 NOTE — Progress Notes (Signed)
[id number] Davis,BSN,RN3,CCM  [phone]/tct-Dr. Nandigam for level of care/ tcf-Amy Easterwood PA-C- may be changed to observation.

## 2015-12-02 NOTE — Progress Notes (Signed)
Date:  December 02, 2015 Chart reviewed for concurrent status and case management needs. Will continue to follow the patient for status change: Discharge Planning: following for needs Expected discharge date: [id number] Velva Harman, BSN, [place], [place]

## 2015-12-03 ENCOUNTER — Encounter (HOSPITAL_COMMUNITY): Payer: Self-pay | Admitting: *Deleted

## 2015-12-03 ENCOUNTER — Observation Stay (HOSPITAL_COMMUNITY): Payer: Medicare Other | Admitting: Anesthesiology

## 2015-12-03 ENCOUNTER — Encounter (HOSPITAL_COMMUNITY)
Admission: AD | Disposition: A | Discharge status: Home / Self Care | Payer: Self-pay | Source: Ambulatory Visit | Attending: Gastroenterology

## 2015-12-03 DIAGNOSIS — F329 Major depressive disorder, single episode, unspecified: Secondary | ICD-10-CM | POA: Diagnosis not present

## 2015-12-03 DIAGNOSIS — D124 Benign neoplasm of descending colon: Secondary | ICD-10-CM | POA: Diagnosis not present

## 2015-12-03 DIAGNOSIS — R5381 Other malaise: Secondary | ICD-10-CM | POA: Diagnosis not present

## 2015-12-03 DIAGNOSIS — R195 Other fecal abnormalities: Secondary | ICD-10-CM

## 2015-12-03 DIAGNOSIS — M810 Age-related osteoporosis without current pathological fracture: Secondary | ICD-10-CM | POA: Diagnosis not present

## 2015-12-03 DIAGNOSIS — D123 Benign neoplasm of transverse colon: Secondary | ICD-10-CM

## 2015-12-03 DIAGNOSIS — K219 Gastro-esophageal reflux disease without esophagitis: Secondary | ICD-10-CM | POA: Diagnosis not present

## 2015-12-03 DIAGNOSIS — E785 Hyperlipidemia, unspecified: Secondary | ICD-10-CM | POA: Diagnosis not present

## 2015-12-03 DIAGNOSIS — D509 Iron deficiency anemia, unspecified: Principal | ICD-10-CM

## 2015-12-03 DIAGNOSIS — I2510 Atherosclerotic heart disease of native coronary artery without angina pectoris: Secondary | ICD-10-CM | POA: Diagnosis not present

## 2015-12-03 DIAGNOSIS — K5730 Diverticulosis of large intestine without perforation or abscess without bleeding: Secondary | ICD-10-CM | POA: Diagnosis not present

## 2015-12-03 DIAGNOSIS — K648 Other hemorrhoids: Secondary | ICD-10-CM | POA: Diagnosis not present

## 2015-12-03 DIAGNOSIS — K635 Polyp of colon: Secondary | ICD-10-CM | POA: Diagnosis not present

## 2015-12-03 DIAGNOSIS — J449 Chronic obstructive pulmonary disease, unspecified: Secondary | ICD-10-CM | POA: Diagnosis not present

## 2015-12-03 DIAGNOSIS — I69331 Monoplegia of upper limb following cerebral infarction affecting right dominant side: Secondary | ICD-10-CM | POA: Diagnosis not present

## 2015-12-03 HISTORY — PX: COLONOSCOPY WITH PROPOFOL: SHX5780

## 2015-12-03 SURGERY — COLONOSCOPY WITH PROPOFOL
Anesthesia: Monitor Anesthesia Care

## 2015-12-03 MED ORDER — PROPOFOL 10 MG/ML IV BOLUS
INTRAVENOUS | Status: AC
  Filled 2015-12-03: qty 40

## 2015-12-03 MED ORDER — PROPOFOL 10 MG/ML IV BOLUS
INTRAVENOUS | Status: AC
  Filled 2015-12-03: qty 20

## 2015-12-03 MED ORDER — LACTATED RINGERS IV SOLN
INTRAVENOUS | Status: DC
  Administered 2015-12-03: 1000 mL via INTRAVENOUS

## 2015-12-03 SURGICAL SUPPLY — 21 items

## 2015-12-03 NOTE — Op Note (Signed)
[facility] Patient Name: Brandy Paul Procedure Date: 12/03/2015 MRN: [id number] Attending MD: Mauri Pole , MD Date of Birth: 05/21/1945 CSN: [id number] Age: 70 Admit Type: Inpatient Procedure:                Colonoscopy Indications:              Unexplained iron deficiency anemia, Positive                            Cologuard test Providers:                Mauri Pole, MD, Cleda Daub, RN, Ralene Bathe, Technician, Delena Bali, CRNA Referring MD:              Medicines:                Monitored Anesthesia Care Complications:            No immediate complications. Estimated Blood Loss:     Estimated blood loss was minimal. Procedure:                Pre-Anesthesia Assessment:                           - Prior to the procedure, a History and Physical                            was performed, and patient medications and                            allergies were reviewed. The patient's tolerance of                            previous anesthesia was also reviewed. The risks                            and benefits of the procedure and the sedation                            options and risks were discussed with the patient.                            All questions were answered, and informed consent                            was obtained. Prior Anticoagulants: The patient has                            taken no previous anticoagulant or antiplatelet                            agents. ASA Grade Assessment: III - A patient with  severe systemic disease. After reviewing the risks                            and benefits, the patient was deemed in                            satisfactory condition to undergo the procedure.                           After obtaining informed consent, the colonoscope                            was passed under direct vision. Throughout the   procedure, the patient's blood pressure, pulse, and                            oxygen saturations were monitored continuously. The                            [id number]) scope was introduced through                            the anus and advanced to the the terminal ileum,                            with identification of the appendiceal orifice and                            IC valve. The colonoscopy was performed without                            difficulty. The patient tolerated the procedure                            well. The quality of the bowel preparation was                            good. The terminal ileum, ileocecal valve,                            appendiceal orifice, and rectum were photographed. Scope In: 1:09:14 PM Scope Out: 1:26:22 PM Scope Withdrawal Time: 0 hours 10 minutes 16 seconds  Total Procedure Duration: 0 hours 17 minutes 8 seconds  Findings:      The perianal and digital rectal examinations were normal.      A 15 mm polyp was found in the descending colon. The polyp was       semi-pedunculated. The polyp was removed with a hot snare. Resection and       retrieval were complete.      A 3 mm polyp was found in the transverse colon. The polyp was sessile.       The polyp was removed with a cold biopsy forceps. Resection and       retrieval were complete.      Multiple small and large-mouthed diverticula were found in the sigmoid  colon and descending colon.      Non-bleeding internal hemorrhoids were found during retroflexion. The       hemorrhoids were small. Impression:               - One 15 mm polyp in the descending colon, removed                            with a hot snare. Resected and retrieved.                           - One 3 mm polyp in the transverse colon, removed                            with a cold biopsy forceps. Resected and retrieved.                           - Diverticulosis in the sigmoid colon and in the                             descending colon.                           - Non-bleeding internal hemorrhoids. Moderate Sedation:      N/A- Per Anesthesia Care Recommendation:           - Patient has a contact number available for                            emergencies. The signs and symptoms of potential                            delayed complications were discussed with the                            patient. Return to normal activities tomorrow.                            Written discharge instructions were provided to the                            patient.                           - Resume previous diet.                           - Continue present medications.                           - Resume aspirin today and Plavix (clopidogrel)                            tomorrow at prior doses. Refer to managing                            physician for further  adjustment of therapy.                           - Await pathology results.                           - Repeat colonoscopy is recommended for adenoma                            surveillance. The colonoscopy date will be                            determined after pathology results from today's                            exam become available for review.                           - Ok to discharge home today                           - Return to GI clinic PRN. Procedure Code(s):        --- Professional ---                           [phone], Colonoscopy, flexible; with removal of                            tumor(s), polyp(s), or other lesion(s) by snare                            technique                           45380, 30, Colonoscopy, flexible; with biopsy,                            single or multiple Diagnosis Code(s):        --- Professional ---                           D12.4, Benign neoplasm of descending colon                           D12.3, Benign neoplasm of transverse colon (hepatic                            flexure or splenic flexure)                            K64.8, Other hemorrhoids                           D50.9, Iron deficiency anemia, unspecified                           R19.5, Other fecal abnormalities  K57.30, Diverticulosis of large intestine without                            perforation or abscess without bleeding CPT copyright 2016 American Medical Association. All rights reserved. The codes documented in this report are preliminary and upon coder review may  be revised to meet current compliance requirements. Mauri Pole, MD 12/03/2015 1:36:02 PM This report has been signed electronically. Number of Addenda: 0

## 2015-12-03 NOTE — Progress Notes (Signed)
Pt given discharge instructions and all questions answered. Discharged home with husband.

## 2015-12-03 NOTE — Anesthesia Postprocedure Evaluation (Signed)
Anesthesia Post Note  Patient: Brandy Paul  Procedure(s) Performed: Procedure(s) (LRB): COLONOSCOPY WITH PROPOFOL (N/A)  Patient location during evaluation: PACU Anesthesia Type: MAC Level of consciousness: awake and alert Pain management: pain level controlled Vital Signs Assessment: post-procedure vital signs reviewed and stable Respiratory status: spontaneous breathing, nonlabored ventilation, respiratory function stable and patient connected to nasal cannula oxygen Cardiovascular status: stable and blood pressure returned to baseline Anesthetic complications: no    Last Vitals:  Vitals:   12/03/15 1334 12/03/15 1350  BP: (!) 158/61 (!) 144/75  Pulse: 66   Resp: 18   Temp: 36.4 C     Last Pain:  Vitals:   12/03/15 1334  TempSrc: Oral  PainSc:                  [name]

## 2015-12-03 NOTE — Progress Notes (Signed)
Patient ID: Brandy Paul, female   DOB: 22-Jul-1945, 70 y.o.   MRN: [id number]    Progress Note   Subjective  Feels fine, no complaints.[name] Stools are clear/yellow liquid   Objective   Vital signs in last 24 hours: Temp:  [97.8 F (36.6 C)-98.7 F (37.1 C)] 98.7 F (37.1 C) (09/15 0439) Pulse Rate:  [64-80] 80 (09/15 0439) Resp:  [16-19] 18 (09/15 0439) BP: (100-132)/(60-75) 100/75 (09/15 0439) SpO2:  [95 %-100 %] 100 % (09/15 0819) Weight:  [203 lb 4.2 oz (92.2 kg)] 203 lb 4.2 oz (92.2 kg) (09/15 0700) Last BM Date: 12/01/15 General: older  white female in NAD Heart:  Regular rate and rhythm; no murmurs Lungs: Respirations even and unlabored, lungs CTA bilaterally Abdomen:  Soft,large, nontender and nondistended. Normal bowel sounds. Extremities:  Without edema. Neurologic:  Alert and oriented,  grossly normal neurologically. Psych:  Cooperative. Normal mood and affect.  Intake/Output from previous day: 09/14 0701 - 09/15 0700 In: 250 [P.O.:250] Out: -  Intake/Output this shift: No intake/output data recorded.  Lab Results:  Recent Labs  12/01/15 1228  WBC 6.9  HGB 10.2*  HCT 34.2*  PLT 210   BMET  Recent Labs  12/01/15 1228  NA 141  K 3.7  CL 108  CO2 27  GLUCOSE 136*  BUN 20  CREATININE 0.78  CALCIUM 8.8*   LFT  Recent Labs  12/01/15 1228  PROT 6.7  ALBUMIN 3.2*  AST 16  ALT 11*  ALKPHOS 79  BILITOT 0.4   PT/INR  Recent Labs  12/01/15 1228  LABPROT 11.9  INR 0.88      Assessment / Plan:    #1  70 yo female with iron deficiency anemia and positive Cologuard admitted for extended bowel prep , and colonoscopy. Pt debilitated post CVA, unable to prep at home  Plan; Colonoscopy today per Dr Silverio Decamp, hopefully home this afternoon Will need to restart Plavix  Active Problems:   Iron deficiency anemia   Anemia     LOS: 1 day   Brandy Paul  12/03/2015, 8:43 AM

## 2015-12-03 NOTE — Discharge Summary (Signed)
[facility] Discharge Summary  Name: Brandy Paul MRN: [id number] DOB: [birth year] 70 y.o. PCP:  Velna Hatchet, MD  Date of Admission: 12/01/2015 10:31 AM Date of Discharge: 12/03/2015 Attending Physician: Mauri Pole, MD  Discharge Diagnosis: Active Problems:   Iron deficiency anemia   Anemia Colon polyps Hx CVA Debilitation  Consultations: none  Procedures Performed:  No results found.  GI Procedures:Colonoscopy with polypectomy 12/03/2015  History/Physical Exam:  See Admission H&P  Admission HPI:  70 year old female admitted on 12/01/2015 with history of iron deficiency anemia and positive Cologuard testing. Patient has history of CVA and residual right-sided weakness/debilitation and is unable to prep at home for colonoscopy. She had an attempted colonoscopy several months ago which was unsuccessful due to very poor prep. She is admitted currently for extended bowel prep and plan colonoscopy on 12/03/2015.  Hospital Course by problem list: 70 year old female, admitted on 12/01/2015, planned admission for debilitated patient requiring inpatient stay for extended bowel prep and colonoscopy. Patient completed extended bowel prep without difficulty.  She has had a Very benign hospital course, underwent colonoscopy on 12/03/2015 per Dr. Silverio Decamp with finding of 2 colon polyps both of which were removed. She tolerated the procedure well and is discharged home on the afternoon of her colonoscopy. Path results are pending. Patient is on chronic antiplatelet therapy with aspirin and Plavix post CVA. She is instructed to resume aspirin and Plavix tomorrow at previous doses.    Discharge Vitals:  BP (!) 144/75   Pulse 66   Temp 97.5 F (36.4 C) (Oral)   Resp 18   Ht 5\' 3"  (1.6 m)   Wt 203 lb 4.2 oz (92.2 kg)   SpO2 96%   BMI 36.01 kg/m   Discharge Labs: No results found for this or any previous visit (from the past 24 hour(s)).  Disposition and follow-up:    [name] was discharged from [facility] in stable condition.    Follow-up Appointments: as needed-Dr Silverio Decamp   Discharge Medications:   Medication List    STOP taking these medications   traMADol 50 MG tablet Commonly known as:  ULTRAM     TAKE these medications   aspirin 81 MG tablet Take 81 mg by mouth every evening.   atorvastatin 20 MG tablet Commonly known as:  LIPITOR Take 20 mg by mouth at bedtime.   clopidogrel 75 MG tablet Commonly known as:  PLAVIX Take 1 tablet (75 mg total) by mouth at bedtime.   diphenhydrAMINE 25 mg capsule Commonly known as:  BENADRYL Take 25 mg by mouth every 6 (six) hours as needed for itching or allergies.   ezetimibe 10 MG tablet Commonly known as:  ZETIA Take 10 mg by mouth at bedtime.   isosorbide mononitrate 60 MG 24 hr tablet Commonly known as:  IMDUR TAKE 1 TABLET BY MOUTH EVERY DAY   ketoconazole 2 % cream Commonly known as:  NIZORAL APPly topically ON THE SKIN BID as needed for ABDOMINAL RASH   MULTIVITAMIN PO Take 1 tablet by mouth every evening.   nitroGLYCERIN 0.4 MG SL tablet Commonly known as:  NITROSTAT Place 1 tablet (0.4 mg total) under the tongue every 5 (five) minutes as needed. What changed:  reasons to take this   OSCAL 500/200 D-3 PO Take 1 tablet by mouth every evening.   RABEprazole 20 MG tablet Commonly known as:  ACIPHEX Take 20 mg by mouth at bedtime.   sertraline 25 MG tablet Commonly known as:  ZOLOFT  Take 50 mg by mouth at bedtime.   SPIRIVA HANDIHALER 18 MCG inhalation capsule Generic drug:  tiotropium Place 18 mcg into inhaler and inhale daily.       Signed: Nicoletta Ba 12/03/2015, 3:04 PM

## 2015-12-03 NOTE — Anesthesia Preprocedure Evaluation (Signed)
Anesthesia Evaluation  Patient identified by MRN, date of birth, ID band Patient awake    Reviewed: Allergy & Precautions, NPO status , Patient's Chart, lab work & pertinent test results  History of Anesthesia Complications Negative for: history of anesthetic complications  Airway Mallampati: II  TM Distance: >3 FB Neck ROM: Full    Dental no notable dental hx. (+) Dental Advisory Given   Pulmonary COPD, former smoker,    Pulmonary exam normal breath sounds clear to auscultation       Cardiovascular + CAD (coronary spasm)  Normal cardiovascular exam Rhythm:Regular Rate:Normal     Neuro/Psych PSYCHIATRIC DISORDERS Anxiety Depression CVA, Residual Symptoms    GI/Hepatic Neg liver ROS, GERD  ,  Endo/Other  diabetes  Renal/GU negative Renal ROS  negative genitourinary   Musculoskeletal negative musculoskeletal ROS (+)   Abdominal   Peds negative pediatric ROS (+)  Hematology  (+) anemia ,   Anesthesia Other Findings   Reproductive/Obstetrics negative OB ROS                             Anesthesia Physical Anesthesia Plan  ASA: III  Anesthesia Plan: MAC   Post-op Pain Management:    Induction: Intravenous  Airway Management Planned: Nasal Cannula  Additional Equipment:   Intra-op Plan:   Post-operative Plan:   Informed Consent: I have reviewed the patients History and Physical, chart, labs and discussed the procedure including the risks, benefits and alternatives for the proposed anesthesia with the patient or authorized representative who has indicated his/her understanding and acceptance.   Dental advisory given  Plan Discussed with: CRNA  Anesthesia Plan Comments:         Anesthesia Quick Evaluation

## 2015-12-03 NOTE — Transfer of Care (Signed)
Immediate Anesthesia Transfer of Care Note  Patient: Brandy Paul  Procedure(s) Performed: Procedure(s) with comments: COLONOSCOPY WITH PROPOFOL (N/A) - tba before procdcedure  Patient Location: PACU  Anesthesia Type:MAC  Level of Consciousness:  sedated, patient cooperative and responds to stimulation  Airway & Oxygen Therapy:Patient Spontanous Breathing and Patient connected to face mask oxgen  Post-op Assessment:  Report given to PACU RN and Post -op Vital signs reviewed and stable  Post vital signs:  Reviewed and stable  Last Vitals:  Vitals:   12/03/15 1155 12/03/15 1334  BP: (!) 148/59 (!) 158/61  Pulse: 62 66  Resp: 18 18  Temp: 36.9 C [id number] C    Complications: No apparent anesthesia complications

## 2015-12-03 NOTE — Discharge Instructions (Addendum)
Restart aspirin and Plavix tomorrow 12/04/2015  Start Miralax 17 gm in 8 oz water daily for chronic constipation

## 2015-12-06 ENCOUNTER — Encounter (HOSPITAL_COMMUNITY): Payer: Self-pay | Admitting: Gastroenterology

## 2015-12-09 ENCOUNTER — Encounter: Payer: Self-pay | Admitting: Gastroenterology

## 2015-12-21 ENCOUNTER — Ambulatory Visit: Payer: Medicare Other | Admitting: Interventional Cardiology

## 2016-01-05 ENCOUNTER — Other Ambulatory Visit: Payer: Self-pay | Admitting: Interventional Cardiology

## 2016-02-22 DIAGNOSIS — Z23 Encounter for immunization: Secondary | ICD-10-CM | POA: Diagnosis not present

## 2016-02-22 DIAGNOSIS — F325 Major depressive disorder, single episode, in full remission: Secondary | ICD-10-CM | POA: Diagnosis not present

## 2016-02-22 DIAGNOSIS — R8299 Other abnormal findings in urine: Secondary | ICD-10-CM | POA: Diagnosis not present

## 2016-02-22 DIAGNOSIS — J449 Chronic obstructive pulmonary disease, unspecified: Secondary | ICD-10-CM | POA: Diagnosis not present

## 2016-02-22 DIAGNOSIS — N390 Urinary tract infection, site not specified: Secondary | ICD-10-CM | POA: Diagnosis not present

## 2016-02-22 DIAGNOSIS — D692 Other nonthrombocytopenic purpura: Secondary | ICD-10-CM | POA: Diagnosis not present

## 2016-02-22 DIAGNOSIS — E784 Other hyperlipidemia: Secondary | ICD-10-CM | POA: Diagnosis not present

## 2016-02-22 DIAGNOSIS — N839 Noninflammatory disorder of ovary, fallopian tube and broad ligament, unspecified: Secondary | ICD-10-CM | POA: Diagnosis not present

## 2016-02-22 DIAGNOSIS — Z6836 Body mass index (BMI) 36.0-36.9, adult: Secondary | ICD-10-CM | POA: Diagnosis not present

## 2016-02-22 DIAGNOSIS — E119 Type 2 diabetes mellitus without complications: Secondary | ICD-10-CM | POA: Diagnosis not present

## 2016-02-22 DIAGNOSIS — I69961 Other paralytic syndrome following unspecified cerebrovascular disease affecting right dominant side: Secondary | ICD-10-CM | POA: Diagnosis not present

## 2016-02-22 DIAGNOSIS — Z0000 Encounter for general adult medical examination without abnormal findings: Secondary | ICD-10-CM | POA: Diagnosis not present

## 2016-02-22 DIAGNOSIS — C50019 Malignant neoplasm of nipple and areola, unspecified female breast: Secondary | ICD-10-CM | POA: Diagnosis not present

## 2016-02-22 DIAGNOSIS — M810 Age-related osteoporosis without current pathological fracture: Secondary | ICD-10-CM | POA: Diagnosis not present

## 2016-03-01 ENCOUNTER — Encounter: Payer: Self-pay | Admitting: Interventional Cardiology

## 2016-03-01 ENCOUNTER — Ambulatory Visit (INDEPENDENT_AMBULATORY_CARE_PROVIDER_SITE_OTHER): Payer: Medicare Other | Admitting: Interventional Cardiology

## 2016-03-01 VITALS — BP 148/82 | HR 60 | Ht 63.0 in | Wt 204.4 lb

## 2016-03-01 DIAGNOSIS — E784 Other hyperlipidemia: Secondary | ICD-10-CM | POA: Diagnosis not present

## 2016-03-01 DIAGNOSIS — I209 Angina pectoris, unspecified: Secondary | ICD-10-CM

## 2016-03-01 DIAGNOSIS — I69351 Hemiplegia and hemiparesis following cerebral infarction affecting right dominant side: Secondary | ICD-10-CM

## 2016-03-01 DIAGNOSIS — R0602 Shortness of breath: Secondary | ICD-10-CM

## 2016-03-01 DIAGNOSIS — I201 Angina pectoris with documented spasm: Secondary | ICD-10-CM

## 2016-03-01 DIAGNOSIS — E7849 Other hyperlipidemia: Secondary | ICD-10-CM

## 2016-03-01 NOTE — Patient Instructions (Signed)

## 2016-03-01 NOTE — Progress Notes (Signed)
Cardiology Office Note    Date:  03/01/2016   ID:  Brandy, Paul [birth year], MRN [id number]  PCP:  Brandy Hatchet, MD  Cardiologist: Brandy Grooms, MD   Chief Complaint  Patient presents with  . Chest Pain    History of Present Illness:  Brandy Paul is a 70 y.o. female with history of prior stroke, coronary artery spasm, obesity, and recent normal myocardial perfusion study (August 2017).  Over the past 6 months she has undergone robotic removal of the left ovary and fallopian tube. She has also undergone colonoscopy with polypectomy. She has had no complications.  She denies without he, PND, dyspnea, and syncope. There is been no recent nitroglycerin use.  Past Medical History:  Diagnosis Date  . Breast cancer ([place]) 2005   left  . COPD (chronic obstructive pulmonary disease) ([facility])    hx of tobacco use  . Coronary artery spasm ([place])   . Depression   . GERD (gastroesophageal reflux disease)   . Hyperlipemia   . IDA (iron deficiency anemia)    last iron infusion 4 months ago  . Osteoporosis   . 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 Prediabetes    hx of  . Rash    under right breast and groin line using nizoral ointment to q day per dermatology saw few weeks ago  . Stress incontinence wers depends  . Stroke [facility]) 2013   paralytic syndrome of dominant right side  . Toe fracture, left     Past Surgical History:  Procedure Laterality Date  . ABDOMINAL HYSTERECTOMY     complete  . COLONOSCOPY WITH PROPOFOL N/A 12/03/2015   Procedure: COLONOSCOPY WITH PROPOFOL;  Surgeon: Brandy Pole, MD;  Location: [facility];  Service: Endoscopy;  Laterality: N/A;  tba before procdcedure  . MASTECTOMY Left    1 lymph node removed  . RADIAL KERATOTOMY Bilateral   . ROBOTIC ASSISTED BILATERAL SALPINGO OOPHERECTOMY Bilateral 11/04/2015   Procedure: XI ROBOTIC ASSISTED BILATERAL SALPINGO OOPHORECTOMY;  Surgeon: Brandy Amber, MD;  Location: [facility] ORS;  Service:  Gynecology;  Laterality: Bilateral;  . TONSILLECTOMY      Current Medications: Outpatient Medications Prior to Visit  Medication Sig Dispense Refill  . aspirin 81 MG tablet Take 81 mg by mouth every evening.     Marland Kitchen atorvastatin (LIPITOR) 20 MG tablet Take 20 mg by mouth at bedtime.   4  . Calcium Carbonate-Vitamin D (OSCAL 500/200 D-3 PO) Take 1 tablet by mouth every evening.     . clopidogrel (PLAVIX) 75 MG tablet Take 1 tablet (75 mg total) by mouth at bedtime.    . diphenhydrAMINE (BENADRYL) 25 mg capsule Take 25 mg by mouth every 6 (six) hours as needed for itching or allergies.    Marland Kitchen ezetimibe (ZETIA) 10 MG tablet Take 10 mg by mouth at bedtime.     . isosorbide mononitrate (IMDUR) 60 MG 24 hr tablet TAKE 1 TABLET BY MOUTH DAILY 90 tablet 2  . ketoconazole (NIZORAL) 2 % cream APPly topically ON THE SKIN BID as needed for ABDOMINAL RASH  2  . Multiple Vitamins-Minerals (MULTIVITAMIN PO) Take 1 tablet by mouth every evening.     . RABEprazole (ACIPHEX) 20 MG tablet Take 20 mg by mouth at bedtime.   2  . sertraline (ZOLOFT) 25 MG tablet Take 50 mg by mouth at bedtime.   3  . tiotropium (SPIRIVA HANDIHALER) 18 MCG inhalation capsule Place 18 mcg into  inhaler and inhale daily.     . nitroGLYCERIN (NITROSTAT) 0.4 MG SL tablet Place 1 tablet (0.4 mg total) under the tongue every 5 (five) minutes as needed. (Patient not taking: Reported on 03/01/2016) 25 tablet 3   No facility-administered medications prior to visit.      Allergies:   Ceclor [cefaclor]   Social History   Social History  . Marital status: Married    Spouse name: N/A  . Number of children: 2  . Years of education: N/A   Occupational History  . retired    Social History Main Topics  . Smoking status: Former Smoker    Types: Cigarettes    Quit date: 02/17/1994  . Smokeless tobacco: Never Used  . Alcohol use No  . Drug use: No  . Sexual activity: No   Other Topics Concern  . None   Social History Narrative  .  None     Family History:  The patient's family history includes Arthritis in her sister; CVA in her mother; Diabetes in her sister; Heart attack in her sister; Hypertension in her sister; Liver cancer in her mother; Stroke in her maternal grandfather.   ROS:   Please see the history of present illness.    Easy bruising. Rash.  All other systems reviewed and are negative.   PHYSICAL EXAM:   VS:  BP (!) 148/82 (BP Location: Right Arm)   Pulse 60   Ht 5\' 3"  (1.6 m)   Wt 204 lb 6.4 oz (92.7 kg)   BMI 36.21 kg/m    GEN: Well nourished, well developed, in no acute distress  HEENT: normal  Neck: no JVD, carotid bruits, or masses Cardiac: RRR; no murmurs, rubs, or gallops,no edema  Respiratory:  clear to auscultation bilaterally, normal work of breathing GI: soft, nontender, nondistended, + BS MS: no deformity or atrophy  Skin: warm and dry, no rash Neuro:  There is right hemiparesis. Psych: euthymic mood, full affect  Wt Readings from Last 3 Encounters:  03/01/16 204 lb 6.4 oz (92.7 kg)  12/03/15 203 lb 4.2 oz (92.2 kg)  11/29/15 203 lb 4.8 oz (92.2 kg)      Studies/Labs Reviewed:   EKG:  EKG  No new data  Recent Labs: 12/01/2015: ALT 11; BUN 20; Creatinine, Ser 0.78; Hemoglobin 10.2; Platelets 210; Potassium 3.7; Sodium 141   Lipid Panel No results found for: CHOL, TRIG, HDL, CHOLHDL, VLDL, LDLCALC, LDLDIRECT  Additional studies/ records that were reviewed today include:  Stress test, August 2017: Study Highlights     Nuclear stress EF: 60%.  There was no ST segment deviation noted during stress.  The study is normal.  This is a low risk study.  The left ventricular ejection fraction is normal (55-65%).   Normal resting and stress perfusion. No ischemia or infarction EF 60%        ASSESSMENT:    1. Coronary artery spasm ([place])   2. SOB (shortness of breath)   3. Other hyperlipidemia   4. Hemiparesis affecting right side as late effect of  cerebrovascular accident [facility])      PLAN:  In order of problems listed above:  1. Stable. Recent normal myocardial perfusion study. 2. This problem is chronic and has not progressed. No further evaluation at this time. Probably related to obesity and deconditioning. 3. On Zetia and atorvastatin. This is followed by the primary care physician. 4. No change from neurological standpoint.  Plan 1 year follow-up  Medication Adjustments/Labs and Tests  Ordered: Current medicines are reviewed at length with the patient today.  Concerns regarding medicines are outlined above.  Medication changes, Labs and Tests ordered today are listed in the Patient Instructions below. There are no Patient Instructions on file for this visit.   Signed, Brandy Grooms, MD  03/01/2016 8:46 AM    [facility] [place], [place], [place]  [address] Phone: [phone]; Fax: [phone]

## 2016-03-07 DIAGNOSIS — Z1212 Encounter for screening for malignant neoplasm of rectum: Secondary | ICD-10-CM | POA: Diagnosis not present

## 2016-08-01 DIAGNOSIS — J449 Chronic obstructive pulmonary disease, unspecified: Secondary | ICD-10-CM | POA: Diagnosis not present

## 2016-08-01 DIAGNOSIS — M25532 Pain in left wrist: Secondary | ICD-10-CM | POA: Diagnosis not present

## 2016-08-01 DIAGNOSIS — I69961 Other paralytic syndrome following unspecified cerebrovascular disease affecting right dominant side: Secondary | ICD-10-CM | POA: Diagnosis not present

## 2016-08-01 DIAGNOSIS — M810 Age-related osteoporosis without current pathological fracture: Secondary | ICD-10-CM | POA: Diagnosis not present

## 2016-08-01 DIAGNOSIS — Z6835 Body mass index (BMI) 35.0-35.9, adult: Secondary | ICD-10-CM | POA: Diagnosis not present

## 2016-08-01 DIAGNOSIS — M25512 Pain in left shoulder: Secondary | ICD-10-CM | POA: Diagnosis not present

## 2016-08-18 DIAGNOSIS — I638 Other cerebral infarction: Secondary | ICD-10-CM | POA: Diagnosis not present

## 2016-08-18 DIAGNOSIS — M19132 Post-traumatic osteoarthritis, left wrist: Secondary | ICD-10-CM | POA: Diagnosis not present

## 2016-08-23 DIAGNOSIS — H35351 Cystoid macular degeneration, right eye: Secondary | ICD-10-CM | POA: Diagnosis not present

## 2016-08-31 DIAGNOSIS — D692 Other nonthrombocytopenic purpura: Secondary | ICD-10-CM | POA: Diagnosis not present

## 2016-08-31 DIAGNOSIS — M25532 Pain in left wrist: Secondary | ICD-10-CM | POA: Diagnosis not present

## 2016-08-31 DIAGNOSIS — E119 Type 2 diabetes mellitus without complications: Secondary | ICD-10-CM | POA: Diagnosis not present

## 2016-08-31 DIAGNOSIS — F325 Major depressive disorder, single episode, in full remission: Secondary | ICD-10-CM | POA: Diagnosis not present

## 2016-08-31 DIAGNOSIS — Z6835 Body mass index (BMI) 35.0-35.9, adult: Secondary | ICD-10-CM | POA: Diagnosis not present

## 2016-08-31 DIAGNOSIS — M810 Age-related osteoporosis without current pathological fracture: Secondary | ICD-10-CM | POA: Diagnosis not present

## 2016-08-31 DIAGNOSIS — E784 Other hyperlipidemia: Secondary | ICD-10-CM | POA: Diagnosis not present

## 2016-09-27 ENCOUNTER — Other Ambulatory Visit: Payer: Self-pay | Admitting: Interventional Cardiology

## 2017-02-15 DIAGNOSIS — M810 Age-related osteoporosis without current pathological fracture: Secondary | ICD-10-CM | POA: Diagnosis not present

## 2017-02-16 DIAGNOSIS — M810 Age-related osteoporosis without current pathological fracture: Secondary | ICD-10-CM | POA: Diagnosis not present

## 2017-02-16 DIAGNOSIS — E119 Type 2 diabetes mellitus without complications: Secondary | ICD-10-CM | POA: Diagnosis not present

## 2017-02-16 DIAGNOSIS — Z0000 Encounter for general adult medical examination without abnormal findings: Secondary | ICD-10-CM | POA: Diagnosis not present

## 2017-02-16 DIAGNOSIS — E7849 Other hyperlipidemia: Secondary | ICD-10-CM | POA: Diagnosis not present

## 2017-02-23 DIAGNOSIS — R32 Unspecified urinary incontinence: Secondary | ICD-10-CM | POA: Diagnosis not present

## 2017-02-23 DIAGNOSIS — Z23 Encounter for immunization: Secondary | ICD-10-CM | POA: Diagnosis not present

## 2017-02-23 DIAGNOSIS — N839 Noninflammatory disorder of ovary, fallopian tube and broad ligament, unspecified: Secondary | ICD-10-CM | POA: Diagnosis not present

## 2017-02-23 DIAGNOSIS — M810 Age-related osteoporosis without current pathological fracture: Secondary | ICD-10-CM | POA: Diagnosis not present

## 2017-02-23 DIAGNOSIS — J449 Chronic obstructive pulmonary disease, unspecified: Secondary | ICD-10-CM | POA: Diagnosis not present

## 2017-02-23 DIAGNOSIS — R5383 Other fatigue: Secondary | ICD-10-CM | POA: Diagnosis not present

## 2017-02-23 DIAGNOSIS — F325 Major depressive disorder, single episode, in full remission: Secondary | ICD-10-CM | POA: Diagnosis not present

## 2017-02-23 DIAGNOSIS — D508 Other iron deficiency anemias: Secondary | ICD-10-CM | POA: Diagnosis not present

## 2017-02-23 DIAGNOSIS — D692 Other nonthrombocytopenic purpura: Secondary | ICD-10-CM | POA: Diagnosis not present

## 2017-02-23 DIAGNOSIS — Z6837 Body mass index (BMI) 37.0-37.9, adult: Secondary | ICD-10-CM | POA: Diagnosis not present

## 2017-02-23 DIAGNOSIS — Z0000 Encounter for general adult medical examination without abnormal findings: Secondary | ICD-10-CM | POA: Diagnosis not present

## 2017-02-23 DIAGNOSIS — Z1389 Encounter for screening for other disorder: Secondary | ICD-10-CM | POA: Diagnosis not present

## 2017-02-23 DIAGNOSIS — E7849 Other hyperlipidemia: Secondary | ICD-10-CM | POA: Diagnosis not present

## 2017-02-28 ENCOUNTER — Encounter: Payer: Self-pay | Admitting: Interventional Cardiology

## 2017-02-28 DIAGNOSIS — Z1212 Encounter for screening for malignant neoplasm of rectum: Secondary | ICD-10-CM | POA: Diagnosis not present

## 2017-03-06 ENCOUNTER — Ambulatory Visit: Payer: Medicare Other | Admitting: Interventional Cardiology

## 2017-03-20 HISTORY — PX: APPENDECTOMY: SHX54

## 2017-03-27 ENCOUNTER — Other Ambulatory Visit: Payer: Self-pay | Admitting: Interventional Cardiology

## 2017-04-22 DIAGNOSIS — R0609 Other forms of dyspnea: Secondary | ICD-10-CM

## 2017-04-22 NOTE — Progress Notes (Signed)
Cardiology Office Note    Date:  04/23/2017   ID:  Tiawanna, Luchsinger 1945-09-19, MRN [id number]  PCP:  Velna Hatchet, MD  Cardiologist: Sinclair Grooms, MD   Chief Complaint  Patient presents with  . Coronary Artery Disease    CA Spasm    History of Present Illness:  Brandy Paul is a 72 y.o. female with history of prior stroke, coronary artery spasm, obesity, and recent normal myocardial perfusion study (August 2017).   Doing well.  No episodes of chest discomfort or requirement for sublingual nitroglycerin.  She has not had syncope.  No palpitations.  Occasionally has right lower extremity swelling if prolonged sitting.  She denies orthopnea, PND, and arm pain.   Past Medical History:  Diagnosis Date  . Breast cancer ([place]) 2005   left  . COPD (chronic obstructive pulmonary disease) (HCC)    hx of tobacco use  . Coronary artery spasm ([place])   . Depression   . GERD (gastroesophageal reflux disease)   . Hyperlipemia   . IDA (iron deficiency anemia)    last iron infusion 4 months ago  . Osteoporosis   . 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 Prediabetes    hx of  . Rash    under right breast and groin line using nizoral ointment to q day per dermatology saw few weeks ago  . Stress incontinence wers depends  . Stroke [facility]) 2013   paralytic syndrome of dominant right side  . Toe fracture, left     Past Surgical History:  Procedure Laterality Date  . ABDOMINAL HYSTERECTOMY     complete  . COLONOSCOPY WITH PROPOFOL N/A 12/03/2015   Procedure: COLONOSCOPY WITH PROPOFOL;  Surgeon: Mauri Pole, MD;  Location: [facility];  Service: Endoscopy;  Laterality: N/A;  tba before procdcedure  . MASTECTOMY Left    1 lymph node removed  . RADIAL KERATOTOMY Bilateral   . ROBOTIC ASSISTED BILATERAL SALPINGO OOPHERECTOMY Bilateral 11/04/2015   Procedure: XI ROBOTIC ASSISTED BILATERAL SALPINGO OOPHORECTOMY;  Surgeon: Everitt Amber, MD;  Location: [facility];  Service:  Gynecology;  Laterality: Bilateral;  . TONSILLECTOMY      Current Medications: Outpatient Medications Prior to Visit  Medication Sig Dispense Refill  . aspirin 81 MG tablet Take 81 mg by mouth every evening.     Marland Kitchen atorvastatin (LIPITOR) 20 MG tablet Take 20 mg by mouth at bedtime.   4  . Calcium Carbonate-Vitamin D (OSCAL 500/200 D-3 PO) Take 1 tablet by mouth every evening.     . clopidogrel (PLAVIX) 75 MG tablet Take 1 tablet (75 mg total) by mouth at bedtime.    . diphenhydrAMINE (BENADRYL) 25 mg capsule Take 25 mg by mouth every 6 (six) hours as needed for itching or allergies.    Marland Kitchen ezetimibe (ZETIA) 10 MG tablet Take 10 mg by mouth at bedtime.     . isosorbide mononitrate (IMDUR) 60 MG 24 hr tablet Take 1 tablet (60 mg total) by mouth daily. Patient needs to keep 04/23/17 appointment for further refills 30 tablet 0  . ketoconazole (NIZORAL) 2 % cream APPly topically ON THE SKIN BID as needed for ABDOMINAL RASH  2  . Multiple Vitamins-Minerals (MULTIVITAMIN PO) Take 1 tablet by mouth every evening.     . nitroGLYCERIN (NITROSTAT) 0.4 MG SL tablet Place 0.4 mg under the tongue every 5 (five) minutes as needed for chest pain (Call 911 at 3rd dose within 15 minutes).    Marland Kitchen  RABEprazole (ACIPHEX) 20 MG tablet Take 20 mg by mouth at bedtime.   2  . sertraline (ZOLOFT) 25 MG tablet Take 50 mg by mouth at bedtime.   3  . tiotropium (SPIRIVA HANDIHALER) 18 MCG inhalation capsule Place 18 mcg into inhaler and inhale daily.      No facility-administered medications prior to visit.      Allergies:   Ceclor [cefaclor]   Social History   Socioeconomic History  . Marital status: Married    Spouse name: None  . Number of children: 2  . Years of education: None  . Highest education level: None  Social Needs  . Financial resource strain: None  . Food insecurity - worry: None  . Food insecurity - inability: None  . Transportation needs - medical: None  . Transportation needs - non-medical: None    Occupational History  . Occupation: retired  Tobacco Use  . Smoking status: Former Smoker    Types: Cigarettes    Last attempt to quit: 02/17/1994    Years since quitting: 23.1  . Smokeless tobacco: Never Used  Substance and Sexual Activity  . Alcohol use: No    Alcohol/week: 0.0 oz  . Drug use: No  . Sexual activity: No  Other Topics Concern  . None  Social History Narrative  . None     Family History:  The patient's family history includes Arthritis in her sister; CVA in her mother; Diabetes in her sister; Heart attack in her sister; Hypertension in her sister; Liver cancer in her mother; Stroke in her maternal grandfather.   ROS:   Please see the history of present illness.    Snores at night.  Otherwise no complaints. All other systems reviewed and are negative.   PHYSICAL EXAM:   VS:  BP 124/76   Pulse 63   Ht 5\' 3"  (1.6 m)   Wt 208 lb 14.4 oz (94.8 kg)   BMI 37.00 kg/m    GEN: Well nourished, well developed, in no acute distress.  Obese. HEENT: normal  Neck: no JVD, carotid bruits, or masses Cardiac: RRR; no murmurs, rubs, or gallops,no edema  Respiratory:  clear to auscultation bilaterally, normal work of breathing GI: soft, nontender, nondistended, + BS MS: no deformity or atrophy  Skin: warm and dry, no rash Neuro:  Alert and Oriented x 3, Strength and sensation are intact Psych: euthymic mood, full affect  Wt Readings from Last 3 Encounters:  04/23/17 208 lb 14.4 oz (94.8 kg)  03/01/16 204 lb 6.4 oz (92.7 kg)  12/03/15 203 lb 4.2 oz (92.2 kg)      Studies/Labs Reviewed:   EKG:  EKG normal sinus rhythm, nonspecific T wave flattening.  When compared to prior tracings no change has occurred  Recent Labs: No results found for requested labs within last 8760 hours.   Lipid Panel No results found for: CHOL, TRIG, HDL, CHOLHDL, VLDL, LDLCALC, LDLDIRECT  Additional studies/ records that were reviewed today include:  None    ASSESSMENT:    1.  Coronary artery spasm ([place])   2. DOE (dyspnea on exertion)   3. DM (diabetes mellitus), type 2, uncontrolled with complications ([place])   4. Hemiparesis affecting right side as late effect of cerebrovascular accident [facility])      PLAN:  In order of problems listed above:  1. No significant clinical recurrences.  No nitroglycerin use.  Discussed medications and use as needed. 2. Not a significant problem this year. 3. Not addressed 4. Not  addressed    Medication Adjustments/Labs and Tests Ordered: Current medicines are reviewed at length with the patient today.  Concerns regarding medicines are outlined above.  Medication changes, Labs and Tests ordered today are listed in the Patient Instructions below. Patient Instructions  Medication Instructions:  Your physician recommends that you continue on your current medications as directed. Please refer to the Current Medication list given to you today.  Labwork: None  Testing/Procedures: None  Follow-Up: Your physician wants you to follow-up in: 1 year with Dr. Tamala Julian.  You will receive a reminder letter in the mail two months in advance. If you don't receive a letter, please call our office to schedule the follow-up appointment.   Any Other Special Instructions Will Be Listed Below (If Applicable).     If you need a refill on your cardiac medications before your next appointment, please call your pharmacy.      Signed, Sinclair Grooms, MD  04/23/2017 1:31 PM    [facility] [place], [place], [place]  [address] Phone: [phone]; Fax: [phone]

## 2017-04-23 ENCOUNTER — Encounter: Payer: Self-pay | Admitting: Interventional Cardiology

## 2017-04-23 ENCOUNTER — Ambulatory Visit (INDEPENDENT_AMBULATORY_CARE_PROVIDER_SITE_OTHER): Payer: Medicare Other | Admitting: Interventional Cardiology

## 2017-04-23 VITALS — BP 124/76 | HR 63 | Ht 63.0 in | Wt 208.9 lb

## 2017-04-23 DIAGNOSIS — R0609 Other forms of dyspnea: Secondary | ICD-10-CM

## 2017-04-23 DIAGNOSIS — I69351 Hemiplegia and hemiparesis following cerebral infarction affecting right dominant side: Secondary | ICD-10-CM | POA: Diagnosis not present

## 2017-04-23 DIAGNOSIS — I209 Angina pectoris, unspecified: Secondary | ICD-10-CM

## 2017-04-23 DIAGNOSIS — I201 Angina pectoris with documented spasm: Secondary | ICD-10-CM

## 2017-04-23 DIAGNOSIS — E118 Type 2 diabetes mellitus with unspecified complications: Secondary | ICD-10-CM

## 2017-04-23 DIAGNOSIS — E1165 Type 2 diabetes mellitus with hyperglycemia: Secondary | ICD-10-CM | POA: Diagnosis not present

## 2017-04-23 DIAGNOSIS — IMO0002 Reserved for concepts with insufficient information to code with codable children: Secondary | ICD-10-CM

## 2017-04-23 NOTE — Patient Instructions (Signed)

## 2017-04-24 ENCOUNTER — Other Ambulatory Visit: Payer: Self-pay | Admitting: Interventional Cardiology

## 2017-05-10 ENCOUNTER — Emergency Department (HOSPITAL_COMMUNITY): Payer: Medicare Other | Admitting: Certified Registered Nurse Anesthetist

## 2017-05-10 ENCOUNTER — Inpatient Hospital Stay (HOSPITAL_COMMUNITY)
Admission: EM | Admit: 2017-05-10 | Discharge: 2017-05-15 | DRG: 394 | Disposition: A | Discharge status: Home / Self Care | Payer: Medicare Other | Attending: Surgery | Admitting: Surgery

## 2017-05-10 ENCOUNTER — Other Ambulatory Visit: Payer: Self-pay | Admitting: Internal Medicine

## 2017-05-10 ENCOUNTER — Other Ambulatory Visit: Payer: Self-pay

## 2017-05-10 ENCOUNTER — Ambulatory Visit
Admission: RE | Admit: 2017-05-10 | Discharge: 2017-05-10 | Disposition: A | Discharge status: Home / Self Care | Payer: Medicare Other | Source: Ambulatory Visit | Attending: Internal Medicine | Admitting: Internal Medicine

## 2017-05-10 ENCOUNTER — Encounter (HOSPITAL_COMMUNITY): Admission: EM | Disposition: A | Discharge status: Home / Self Care | Payer: Self-pay | Source: Home / Self Care

## 2017-05-10 ENCOUNTER — Encounter (HOSPITAL_COMMUNITY): Payer: Self-pay | Admitting: *Deleted

## 2017-05-10 DIAGNOSIS — N838 Other noninflammatory disorders of ovary, fallopian tube and broad ligament: Secondary | ICD-10-CM

## 2017-05-10 DIAGNOSIS — Z881 Allergy status to other antibiotic agents status: Secondary | ICD-10-CM

## 2017-05-10 DIAGNOSIS — I69351 Hemiplegia and hemiparesis following cerebral infarction affecting right dominant side: Secondary | ICD-10-CM | POA: Diagnosis not present

## 2017-05-10 DIAGNOSIS — K5730 Diverticulosis of large intestine without perforation or abscess without bleeding: Secondary | ICD-10-CM | POA: Diagnosis not present

## 2017-05-10 DIAGNOSIS — Z7902 Long term (current) use of antithrombotics/antiplatelets: Secondary | ICD-10-CM | POA: Diagnosis not present

## 2017-05-10 DIAGNOSIS — J449 Chronic obstructive pulmonary disease, unspecified: Secondary | ICD-10-CM | POA: Diagnosis present

## 2017-05-10 DIAGNOSIS — N393 Stress incontinence (female) (male): Secondary | ICD-10-CM | POA: Diagnosis present

## 2017-05-10 DIAGNOSIS — R82998 Other abnormal findings in urine: Secondary | ICD-10-CM | POA: Diagnosis not present

## 2017-05-10 DIAGNOSIS — E785 Hyperlipidemia, unspecified: Secondary | ICD-10-CM | POA: Diagnosis not present

## 2017-05-10 DIAGNOSIS — F329 Major depressive disorder, single episode, unspecified: Secondary | ICD-10-CM | POA: Diagnosis present

## 2017-05-10 DIAGNOSIS — K37 Unspecified appendicitis: Secondary | ICD-10-CM | POA: Diagnosis present

## 2017-05-10 DIAGNOSIS — E119 Type 2 diabetes mellitus without complications: Secondary | ICD-10-CM | POA: Diagnosis present

## 2017-05-10 DIAGNOSIS — Z6837 Body mass index (BMI) 37.0-37.9, adult: Secondary | ICD-10-CM

## 2017-05-10 DIAGNOSIS — K5909 Other constipation: Secondary | ICD-10-CM | POA: Diagnosis not present

## 2017-05-10 DIAGNOSIS — I201 Angina pectoris with documented spasm: Secondary | ICD-10-CM | POA: Diagnosis present

## 2017-05-10 DIAGNOSIS — K219 Gastro-esophageal reflux disease without esophagitis: Secondary | ICD-10-CM | POA: Diagnosis present

## 2017-05-10 DIAGNOSIS — Z7982 Long term (current) use of aspirin: Secondary | ICD-10-CM

## 2017-05-10 DIAGNOSIS — I69320 Aphasia following cerebral infarction: Secondary | ICD-10-CM | POA: Diagnosis not present

## 2017-05-10 DIAGNOSIS — R1031 Right lower quadrant pain: Secondary | ICD-10-CM | POA: Diagnosis not present

## 2017-05-10 DIAGNOSIS — K3580 Unspecified acute appendicitis: Secondary | ICD-10-CM | POA: Diagnosis not present

## 2017-05-10 DIAGNOSIS — N839 Noninflammatory disorder of ovary, fallopian tube and broad ligament, unspecified: Secondary | ICD-10-CM | POA: Diagnosis not present

## 2017-05-10 DIAGNOSIS — Z79899 Other long term (current) drug therapy: Secondary | ICD-10-CM | POA: Diagnosis not present

## 2017-05-10 DIAGNOSIS — M810 Age-related osteoporosis without current pathological fracture: Secondary | ICD-10-CM | POA: Diagnosis present

## 2017-05-10 DIAGNOSIS — Z87891 Personal history of nicotine dependence: Secondary | ICD-10-CM | POA: Diagnosis not present

## 2017-05-10 DIAGNOSIS — N390 Urinary tract infection, site not specified: Secondary | ICD-10-CM | POA: Diagnosis not present

## 2017-05-10 DIAGNOSIS — I252 Old myocardial infarction: Secondary | ICD-10-CM | POA: Diagnosis not present

## 2017-05-10 DIAGNOSIS — I2510 Atherosclerotic heart disease of native coronary artery without angina pectoris: Secondary | ICD-10-CM | POA: Diagnosis not present

## 2017-05-10 DIAGNOSIS — E669 Obesity, unspecified: Secondary | ICD-10-CM | POA: Diagnosis present

## 2017-05-10 DIAGNOSIS — Z01810 Encounter for preprocedural cardiovascular examination: Secondary | ICD-10-CM | POA: Diagnosis not present

## 2017-05-10 LAB — URINALYSIS, ROUTINE W REFLEX MICROSCOPIC
Bacteria, UA: NONE SEEN
Bilirubin Urine: NEGATIVE
GLUCOSE, UA: NEGATIVE mg/dL
HGB URINE DIPSTICK: NEGATIVE
KETONES UR: NEGATIVE mg/dL
Nitrite: NEGATIVE
PROTEIN: NEGATIVE mg/dL
Specific Gravity, Urine: >1.046 — ABNORMAL HIGH (ref 1.005–1.030)
pH: 5 (ref 5.0–8.0)

## 2017-05-10 LAB — COMPREHENSIVE METABOLIC PANEL
ALT: 17 U/L (ref 14–54)
ANION GAP: 11 (ref 5–15)
AST: 23 U/L (ref 15–41)
Albumin: 3.7 g/dL (ref 3.5–5.0)
Alkaline Phosphatase: 99 U/L (ref 38–126)
BUN: 16 mg/dL (ref 6–20)
CHLORIDE: 101 mmol/L (ref 101–111)
CO2: 26 mmol/L (ref 22–32)
Calcium: 9.1 mg/dL (ref 8.9–10.3)
Creatinine, Ser: 0.89 mg/dL (ref 0.44–1.00)
Glucose, Bld: 135 mg/dL — ABNORMAL HIGH (ref 65–99)
POTASSIUM: 3.5 mmol/L (ref 3.5–5.1)
SODIUM: 138 mmol/L (ref 135–145)
Total Bilirubin: 0.5 mg/dL (ref 0.3–1.2)
Total Protein: 7.2 g/dL (ref 6.5–8.1)

## 2017-05-10 LAB — CBC
HCT: 41.7 % (ref 36.0–46.0)
HEMOGLOBIN: 12.5 g/dL (ref 12.0–15.0)
MCH: 23.7 pg — AB (ref 26.0–34.0)
MCHC: 30 g/dL (ref 30.0–36.0)
MCV: 79 fL (ref 78.0–100.0)
Platelets: 170 10*3/uL (ref 150–400)
RBC: 5.28 MIL/uL — ABNORMAL HIGH (ref 3.87–5.11)
RDW: 23 % — ABNORMAL HIGH (ref 11.5–15.5)
WBC: 6.2 10*3/uL (ref 4.0–10.5)

## 2017-05-10 LAB — LIPASE, BLOOD: LIPASE: 37 U/L (ref 11–51)

## 2017-05-10 SURGERY — APPENDECTOMY, LAPAROSCOPIC
Anesthesia: General

## 2017-05-10 MED ORDER — ISOSORBIDE MONONITRATE ER 60 MG PO TB24
60.0000 mg | ORAL_TABLET | Freq: Every day | ORAL | Status: DC
  Administered 2017-05-11 – 2017-05-15 (×5): 60 mg via ORAL
  Filled 2017-05-10 (×5): qty 1

## 2017-05-10 MED ORDER — LACTATED RINGERS IV BOLUS (SEPSIS)
1000.0000 mL | Freq: Once | INTRAVENOUS | Status: AC
  Administered 2017-05-10: 1000 mL via INTRAVENOUS

## 2017-05-10 MED ORDER — PIPERACILLIN-TAZOBACTAM 3.375 G IVPB 30 MIN
3.3750 g | Freq: Once | INTRAVENOUS | Status: AC
  Administered 2017-05-10: 3.375 g via INTRAVENOUS
  Filled 2017-05-10: qty 50

## 2017-05-10 MED ORDER — SUCCINYLCHOLINE CHLORIDE 200 MG/10ML IV SOSY
PREFILLED_SYRINGE | INTRAVENOUS | Status: AC
  Filled 2017-05-10: qty 10

## 2017-05-10 MED ORDER — TIOTROPIUM BROMIDE MONOHYDRATE 18 MCG IN CAPS
18.0000 ug | ORAL_CAPSULE | Freq: Every day | RESPIRATORY_TRACT | Status: DC
  Administered 2017-05-11 – 2017-05-15 (×5): 18 ug via RESPIRATORY_TRACT
  Filled 2017-05-10 (×2): qty 5

## 2017-05-10 MED ORDER — LIDOCAINE 2% (20 MG/ML) 5 ML SYRINGE
INTRAMUSCULAR | Status: AC
  Filled 2017-05-10: qty 5

## 2017-05-10 MED ORDER — IOPAMIDOL (ISOVUE-300) INJECTION 61%
125.0000 mL | Freq: Once | INTRAVENOUS | Status: AC | PRN
  Administered 2017-05-10: 125 mL via INTRAVENOUS

## 2017-05-10 MED ORDER — ONDANSETRON HCL 4 MG/2ML IJ SOLN
INTRAMUSCULAR | Status: AC
  Filled 2017-05-10: qty 2

## 2017-05-10 MED ORDER — ONDANSETRON HCL 4 MG/2ML IJ SOLN: 4.0000 mg | Freq: Four times a day (QID) | INTRAMUSCULAR | Status: DC | PRN

## 2017-05-10 MED ORDER — KCL IN DEXTROSE-NACL 20-5-0.9 MEQ/L-%-% IV SOLN
INTRAVENOUS | Status: DC
  Administered 2017-05-11 – 2017-05-12 (×4): via INTRAVENOUS
  Filled 2017-05-10 (×7): qty 1000

## 2017-05-10 MED ORDER — MORPHINE SULFATE (PF) 4 MG/ML IV SOLN: 4.0000 mg | INTRAVENOUS | Status: DC | PRN

## 2017-05-10 MED ORDER — NITROGLYCERIN 0.4 MG SL SUBL: 0.4000 mg | SUBLINGUAL_TABLET | SUBLINGUAL | Status: DC | PRN

## 2017-05-10 MED ORDER — ONDANSETRON 4 MG PO TBDP: 4.0000 mg | ORAL_TABLET | Freq: Four times a day (QID) | ORAL | Status: DC | PRN

## 2017-05-10 MED ORDER — ROCURONIUM BROMIDE 10 MG/ML (PF) SYRINGE
PREFILLED_SYRINGE | INTRAVENOUS | Status: AC
  Filled 2017-05-10: qty 5

## 2017-05-10 MED ORDER — SUGAMMADEX SODIUM 200 MG/2ML IV SOLN
INTRAVENOUS | Status: AC
  Filled 2017-05-10: qty 2

## 2017-05-10 MED ORDER — PANTOPRAZOLE SODIUM 40 MG IV SOLR
40.0000 mg | Freq: Every day | INTRAVENOUS | Status: DC
  Administered 2017-05-10 – 2017-05-11 (×2): 40 mg via INTRAVENOUS
  Filled 2017-05-10 (×2): qty 40

## 2017-05-10 MED ORDER — PROPOFOL 10 MG/ML IV BOLUS
INTRAVENOUS | Status: AC
  Filled 2017-05-10: qty 20

## 2017-05-10 MED ORDER — MORPHINE SULFATE (PF) 2 MG/ML IV SOLN
1.0000 mg | INTRAVENOUS | Status: DC | PRN
  Administered 2017-05-11: 1 mg via INTRAVENOUS
  Administered 2017-05-11: 2 mg via INTRAVENOUS
  Filled 2017-05-10 (×2): qty 1

## 2017-05-10 MED ORDER — HEPARIN SODIUM (PORCINE) 5000 UNIT/ML IJ SOLN
5000.0000 [IU] | Freq: Three times a day (TID) | INTRAMUSCULAR | Status: DC
  Administered 2017-05-11 – 2017-05-15 (×14): 5000 [IU] via SUBCUTANEOUS
  Filled 2017-05-10 (×14): qty 1

## 2017-05-10 MED ORDER — PIPERACILLIN-TAZOBACTAM 3.375 G IVPB
3.3750 g | Freq: Three times a day (TID) | INTRAVENOUS | Status: DC
  Administered 2017-05-10 – 2017-05-14 (×11): 3.375 g via INTRAVENOUS
  Filled 2017-05-10 (×12): qty 50

## 2017-05-10 MED ORDER — FENTANYL CITRATE (PF) 250 MCG/5ML IJ SOLN
INTRAMUSCULAR | Status: AC
  Filled 2017-05-10: qty 5

## 2017-05-10 NOTE — ED Triage Notes (Signed)
Pt from [facility] with confirmed appendicitis. Pt complaint of intermittent right abdominal pain onset a week ago.

## 2017-05-10 NOTE — ED Notes (Signed)
ED TO INPATIENT HANDOFF REPORT  Name/Age/Gender Brandy Paul 72 y.o. female  Code Status    Code Status Orders  (From admission, onward)        Start     Ordered   05/10/17 2210  Full code  Continuous     05/10/17 2210    Code Status History    Date Active Date Inactive Code Status Order ID Comments User Context   12/01/2015 11:53 12/03/2015 18:34 Full Code [id number]  Leotis Pain Inpatient   11/04/2015 17:17 11/05/2015 15:15 Full Code [id number]  Lahoma Crocker, MD Inpatient   04/28/2015 15:30 04/29/2015 16:45 Full Code [id number]  Hvozdovic, Vita Barley, PA-C Inpatient    Advance Directive Documentation     Most Recent Value  Type of Advance Directive  Healthcare Power of [place], Living will  Pre-existing out of facility DNR order (yellow form or pink MOST form)  No data  "MOST" Form in Place?  No data      Home/SNF/Other Home  Chief Complaint appendicitis sent by PCP  Level of Care/Admitting Diagnosis ED Disposition    ED Disposition Condition [facility]: [facility] [[id number]]  Level of Care: Med-Surg [16]  Diagnosis: Appendicitis [619509]  Admitting Physician: CCS, [place]  Attending Physician: CCS, MD [[id number]]  Estimated length of stay: 3 - 4 days  Certification:: I certify this patient will need inpatient services for at least 2 midnights  PT Class (Do Not Modify): Inpatient [101]  PT Acc Code (Do Not Modify): Private [1]       Medical History Past Medical History:  Diagnosis Date  . Breast cancer ([place]) 2005   left  . COPD (chronic obstructive pulmonary disease) (HCC)    hx of tobacco use  . Coronary artery spasm ([place])   . Depression   . GERD (gastroesophageal reflux disease)   . Hyperlipemia   . IDA (iron deficiency anemia)    last iron infusion 4 months ago  . Osteoporosis   . 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 Prediabetes    hx of  . Rash    under right breast and groin line using  nizoral ointment to q day per dermatology saw few weeks ago  . Stress incontinence wers depends  . Stroke [facility]) 2013   paralytic syndrome of dominant right side  . Toe fracture, left     Allergies Allergies  Allergen Reactions  . Ceclor [Cefaclor] Other (See Comments)    REACTION: "SEVERE HEADACHE"    IV Location/Drains/Wounds Patient Lines/Drains/Airways Status   Active Line/Drains/Airways    Name:   Placement date:   Placement time:   Site:   Days:   Peripheral IV 05/10/17 Right Antecubital   05/10/17    1855    Antecubital   less than 1   Incision (Closed) 11/04/15 Abdomen Other (Comment)   11/04/15    1506     553   Incision (Closed) 12/01/15 Abdomen Right;Left;Mid   12/01/15    1118     526   Incision - 5 Ports Abdomen 1: Left;Lateral;Umbilicus 2: Umbilicus 3: Right;Upper;Umbilicus 4: Right;Medial;Umbilicus 5: Right;Lateral;Umbilicus   32/67/12    4580     553          Labs/Imaging Results for orders placed or performed during the hospital encounter of 05/10/17 (from the past 48 hour(s))  Lipase, blood     Status: None   Collection Time: 05/10/17  5:30  PM  Result Value Ref Range   Lipase 37 11 - 51 U/L    Comment: Performed at [facility], [place] [address] [place], [place] [address]  Comprehensive metabolic panel     Status: Abnormal   Collection Time: 05/10/17  5:30 PM  Result Value Ref Range   Sodium 138 135 - 145 mmol/L   Potassium 3.5 3.5 - 5.1 mmol/L   Chloride 101 101 - 111 mmol/L   CO2 26 22 - 32 mmol/L   Glucose, Bld 135 (H) 65 - 99 mg/dL   BUN 16 6 - 20 mg/dL   Creatinine, Ser 0.89 0.44 - 1.00 mg/dL   Calcium 9.1 8.9 - 10.3 mg/dL   Total Protein 7.2 6.5 - 8.1 g/dL   Albumin 3.7 3.5 - 5.0 g/dL   AST 23 15 - 41 U/L   ALT 17 14 - 54 U/L   Alkaline Phosphatase 99 38 - 126 U/L   Total Bilirubin 0.5 0.3 - 1.2 mg/dL   GFR calc non Af Amer >60 >60 mL/min   GFR calc Af Amer >60 >60 mL/min    Comment: (NOTE) The eGFR has been calculated  using the CKD EPI equation. This calculation has not been validated in all clinical situations. eGFR's persistently <60 mL/min signify possible Chronic Kidney Disease.    Anion gap 11 5 - 15    Comment: Performed at [facility], [place] [address] [place], [place] [address]  CBC     Status: Abnormal   Collection Time: 05/10/17  5:30 PM  Result Value Ref Range   WBC 6.2 4.0 - 10.5 K/uL   RBC 5.28 (H) 3.87 - 5.11 MIL/uL   Hemoglobin 12.5 12.0 - 15.0 g/dL   HCT 41.7 36.0 - 46.0 %   MCV 79.0 78.0 - 100.0 fL   MCH 23.7 (L) 26.0 - 34.0 pg   MCHC 30.0 30.0 - 36.0 g/dL   RDW 23.0 (H) 11.5 - 15.5 %   Platelets 170 150 - 400 K/uL    Comment: Performed at [facility], [place] [address], [place], [place] [address]  Urinalysis, Routine w reflex microscopic     Status: Abnormal   Collection Time: 05/10/17  7:50 PM  Result Value Ref Range   Color, Urine YELLOW YELLOW   APPearance CLEAR CLEAR   Specific Gravity, Urine >1.046 (H) 1.005 - 1.030   pH 5.0 5.0 - 8.0   Glucose, UA NEGATIVE NEGATIVE mg/dL   Hgb urine dipstick NEGATIVE NEGATIVE   Bilirubin Urine NEGATIVE NEGATIVE   Ketones, ur NEGATIVE NEGATIVE mg/dL   Protein, ur NEGATIVE NEGATIVE mg/dL   Nitrite NEGATIVE NEGATIVE   Leukocytes, UA SMALL (A) NEGATIVE   RBC / HPF 0-5 0 - 5 RBC/hpf   WBC, UA 0-5 0 - 5 WBC/hpf   Bacteria, UA NONE SEEN NONE SEEN   Squamous Epithelial / LPF 0-5 (A) NONE SEEN    Comment: Performed at [facility], [place] [address] [place], [place] [address]   Ct Abdomen Pelvis W Contrast  Result Date: 05/10/2017 CLINICAL DATA:  Acute right lower quadrant abdominal pain. EXAM: CT ABDOMEN AND PELVIS WITH CONTRAST TECHNIQUE: Multidetector CT imaging of the abdomen and pelvis was performed using the standard protocol following bolus administration of intravenous contrast. CONTRAST:  125 mL of Isovue-300 intravenously. COMPARISON:  CT scan of August 22, 2015. FINDINGS: Lower  chest: Moderate size sliding-type hiatal hernia is noted. Visualized lung bases are unremarkable. Hepatobiliary: No focal  liver abnormality is seen. No gallstones, gallbladder wall thickening, or biliary dilatation. Pancreas: Unremarkable. No pancreatic ductal dilatation or surrounding inflammatory changes. Spleen: Normal in size without focal abnormality. Adrenals/Urinary Tract: Left renal atrophy is noted. Adrenal glands appear normal. No hydronephrosis or renal obstruction is noted. No renal or ureteral calculi are noted. Urinary bladder is unremarkable. Stomach/Bowel: The stomach appears normal. There is no evidence of bowel obstruction. Sigmoid diverticulosis is noted without inflammation. Compared to prior exam, the appendix is enlarged with surrounding inflammation concerning for acute appendicitis. Appendix: Location: Retrocecal. Diameter: 8 mm. Appendicolith: No. Mucosal hyper-enhancement: No. Extraluminal gas: No. Periappendiceal collection: No. Vascular/Lymphatic: Aortic atherosclerosis. No enlarged abdominal or pelvic lymph nodes. Reproductive: Status post hysterectomy. No adnexal masses. Other: No abdominal wall hernia or abnormality. No abdominopelvic ascites. Musculoskeletal: No acute or significant osseous findings. IMPRESSION: Findings consistent with acute appendicitis. No abscess is noted. It is retrocecal in position. These results will be called to the ordering clinician or representative by the Radiologist Assistant, and communication documented in the PACS or zVision Dashboard. Moderate size sliding-type hiatal hernia. Sigmoid diverticulosis without inflammation. Aortic Atherosclerosis (ICD10-I70.0). Electronically Signed   By: Marijo Conception, M.D.   On: 05/10/2017 14:25    Pending Labs Unresulted Labs (From admission, onward)   Start     Ordered   05/11/17 1572  Basic metabolic panel  Tomorrow morning,   R     05/10/17 2210   05/11/17 0500  CBC  Tomorrow morning,   R     05/10/17  2210      Vitals/Pain Today's Vitals   05/10/17 1717 05/10/17 1950 05/10/17 2224  BP: 128/65 131/63 128/63  Pulse: 90 73 63  Resp: _0 Temp: 97.7 F (36.5 C)    TempSrc: Oral    SpO2: 94% 95% 95%    Isolation Precautions No active isolations  Medications Medications  isosorbide mononitrate (IMDUR) 24 hr tablet 60 mg (not administered)  nitroGLYCERIN (NITROSTAT) SL tablet 0.4 mg (not administered)  tiotropium (SPIRIVA) inhalation capsule 18 mcg (not administered)  heparin injection 5,000 Units (not administered)  dextrose 5 % and 0.9 % NaCl with KCl 20 mEq/L infusion (not administered)  piperacillin-tazobactam (ZOSYN) IVPB 3.375 g (3.375 g Intravenous New Bag/Given 05/10/17 2304)  morphine 2 MG/ML injection 1-2 mg (not administered)  pantoprazole (PROTONIX) injection 40 mg (40 mg Intravenous Given 05/10/17 2305)  ondansetron (ZOFRAN-ODT) disintegrating tablet 4 mg (not administered)    Or  ondansetron (ZOFRAN) injection 4 mg (not administered)  morphine 4 MG/ML injection 4 mg (not administered)  lactated ringers bolus 1,000 mL (0 mLs Intravenous Stopped 05/10/17 2054)  piperacillin-tazobactam (ZOSYN) IVPB 3.375 g (0 g Intravenous Stopped 05/10/17 1924)    Mobility walks with device

## 2017-05-10 NOTE — H&P (Signed)
Brandy Paul is an 72 y.o. female.   Chief Complaint: Abdominal pain HPI: The patient is a 72 year old white female who presents with complaints of intermittent abdominal pain that has been going on for more than a week.  She states that the pain comes and goes.  When it occurs it does not last very long.  She denies any fevers or chills.  She denies any nausea or vomiting.  She does have intermittent chest pain that responds to nitroglycerin.  Her family states that she has had several heart attacks in the past.  She also has a history of stroke that has left her right side week.  Her white count is normal.  A CT scan shows some inflammatory change around the appendix but no appendicolith and no evidence of perforation.  Past Medical History:  Diagnosis Date  . Breast cancer ([place]) 2005   left  . COPD (chronic obstructive pulmonary disease) (HCC)    hx of tobacco use  . Coronary artery spasm ([place])   . Depression   . GERD (gastroesophageal reflux disease)   . Hyperlipemia   . IDA (iron deficiency anemia)    last iron infusion 4 months ago  . Osteoporosis   . 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 Prediabetes    hx of  . Rash    under right breast and groin line using nizoral ointment to q day per dermatology saw few weeks ago  . Stress incontinence wers depends  . Stroke [facility]) 2013   paralytic syndrome of dominant right side  . Toe fracture, left     Past Surgical History:  Procedure Laterality Date  . ABDOMINAL HYSTERECTOMY     complete  . COLONOSCOPY WITH PROPOFOL N/A 12/03/2015   Procedure: COLONOSCOPY WITH PROPOFOL;  Surgeon: Mauri Pole, MD;  Location: [facility];  Service: Endoscopy;  Laterality: N/A;  tba before procdcedure  . MASTECTOMY Left    1 lymph node removed  . RADIAL KERATOTOMY Bilateral   . ROBOTIC ASSISTED BILATERAL SALPINGO OOPHERECTOMY Bilateral 11/04/2015   Procedure: XI ROBOTIC ASSISTED BILATERAL SALPINGO OOPHORECTOMY;  Surgeon: Everitt Amber, MD;   Location: [facility];  Service: Gynecology;  Laterality: Bilateral;  . TONSILLECTOMY      Family History  Problem Relation Age of Onset  . CVA Mother   . Liver cancer Mother   . Heart attack Sister   . Stroke Maternal Grandfather   . Hypertension Sister   . Diabetes Sister   . Arthritis Sister    Social History:  reports that she quit smoking about 23 years ago. Her smoking use included cigarettes. she has never used smokeless tobacco. She reports that she does not drink alcohol or use drugs.  Allergies:  Allergies  Allergen Reactions  . Ceclor [Cefaclor] Other (See Comments)    REACTION: "SEVERE HEADACHE"     (Not in a hospital admission)  Results for orders placed or performed during the hospital encounter of 05/10/17 (from the past 48 hour(s))  Lipase, blood     Status: None   Collection Time: 05/10/17  5:30 PM  Result Value Ref Range   Lipase 37 11 - 51 U/L    Comment: Performed at [facility], [place] [address], [place], [place] [address]  Comprehensive metabolic panel     Status: Abnormal   Collection Time: 05/10/17  5:30 PM  Result Value Ref Range   Sodium 138 135 - 145 mmol/L   Potassium 3.5 3.5 -  5.1 mmol/L   Chloride 101 101 - 111 mmol/L   CO2 26 22 - 32 mmol/L   Glucose, Bld 135 (H) 65 - 99 mg/dL   BUN 16 6 - 20 mg/dL   Creatinine, Ser 0.89 0.44 - 1.00 mg/dL   Calcium 9.1 8.9 - 10.3 mg/dL   Total Protein 7.2 6.5 - 8.1 g/dL   Albumin 3.7 3.5 - 5.0 g/dL   AST 23 15 - 41 U/L   ALT 17 14 - 54 U/L   Alkaline Phosphatase 99 38 - 126 U/L   Total Bilirubin 0.5 0.3 - 1.2 mg/dL   GFR calc non Af Amer >60 >60 mL/min   GFR calc Af Amer >60 >60 mL/min    Comment: (NOTE) The eGFR has been calculated using the CKD EPI equation. This calculation has not been validated in all clinical situations. eGFR's persistently <60 mL/min signify possible Chronic Kidney Disease.    Anion gap 11 5 - 15    Comment: Performed at [facility], [place] [address], [place], [place] [address]  CBC     Status: Abnormal   Collection Time: 05/10/17  5:30 PM  Result Value Ref Range   WBC 6.2 4.0 - 10.5 K/uL   RBC 5.28 (H) 3.87 - 5.11 MIL/uL   Hemoglobin 12.5 12.0 - 15.0 g/dL   HCT 41.7 36.0 - 46.0 %   MCV 79.0 78.0 - 100.0 fL   MCH 23.7 (L) 26.0 - 34.0 pg   MCHC 30.0 30.0 - 36.0 g/dL   RDW 23.0 (H) 11.5 - 15.5 %   Platelets 170 150 - 400 K/uL    Comment: Performed at [facility], [place] [address], [place], [place] [address]   Ct Abdomen Pelvis W Contrast  Result Date: 05/10/2017 CLINICAL DATA:  Acute right lower quadrant abdominal pain. EXAM: CT ABDOMEN AND PELVIS WITH CONTRAST TECHNIQUE: Multidetector CT imaging of the abdomen and pelvis was performed using the standard protocol following bolus administration of intravenous contrast. CONTRAST:  125 mL of Isovue-300 intravenously. COMPARISON:  CT scan of August 22, 2015. FINDINGS: Lower chest: Moderate size sliding-type hiatal hernia is noted. Visualized lung bases are unremarkable. Hepatobiliary: No focal liver abnormality is seen. No gallstones, gallbladder wall thickening, or biliary dilatation. Pancreas: Unremarkable. No pancreatic ductal dilatation or surrounding inflammatory changes. Spleen: Normal in size without focal abnormality. Adrenals/Urinary Tract: Left renal atrophy is noted. Adrenal glands appear normal. No hydronephrosis or renal obstruction is noted. No renal or ureteral calculi are noted. Urinary bladder is unremarkable. Stomach/Bowel: The stomach appears normal. There is no evidence of bowel obstruction. Sigmoid diverticulosis is noted without inflammation. Compared to prior exam, the appendix is enlarged with surrounding inflammation concerning for acute appendicitis. Appendix: Location: Retrocecal. Diameter: 8 mm. Appendicolith: No. Mucosal hyper-enhancement: No. Extraluminal gas: No. Periappendiceal collection: No. Vascular/Lymphatic: Aortic atherosclerosis. No  enlarged abdominal or pelvic lymph nodes. Reproductive: Status post hysterectomy. No adnexal masses. Other: No abdominal wall hernia or abnormality. No abdominopelvic ascites. Musculoskeletal: No acute or significant osseous findings. IMPRESSION: Findings consistent with acute appendicitis. No abscess is noted. It is retrocecal in position. These results will be called to the ordering clinician or representative by the Radiologist Assistant, and communication documented in the PACS or zVision Dashboard. Moderate size sliding-type hiatal hernia. Sigmoid diverticulosis without inflammation. Aortic Atherosclerosis (ICD10-I70.0). Electronically Signed   By: Marijo Conception, M.D.   On: 05/10/2017 14:25    Review of Systems  Constitutional: Negative.  Negative for fever.  HENT: Negative.   Eyes: Negative.   Respiratory: Negative.   Cardiovascular: Positive for chest pain.  Gastrointestinal: Positive for abdominal pain. Negative for nausea and vomiting.  Genitourinary: Negative.   Musculoskeletal: Negative.   Skin: Negative.   Neurological: Negative.   Endo/Heme/Allergies: Negative.   Psychiatric/Behavioral: Negative.     Blood pressure 131/63, pulse 73, temperature 97.7 F (36.5 C), temperature source Oral, resp. rate 16, SpO2 95 %. Physical Exam  Constitutional: She is oriented to person, place, and time. She appears well-developed and well-nourished. No distress.  HENT:  Head: Normocephalic and atraumatic.  Mouth/Throat: No oropharyngeal exudate.  Eyes: Conjunctivae and EOM are normal. Pupils are equal, round, and reactive to light.  Neck: Normal range of motion. Neck supple.  Cardiovascular: Normal rate, regular rhythm and normal heart sounds.  Respiratory: Effort normal and breath sounds normal. No stridor. No respiratory distress.  GI: Soft. Bowel sounds are normal. She exhibits no distension. There is tenderness.  Musculoskeletal: Normal range of motion. She exhibits no edema or  tenderness.  Neurological: She is alert and oriented to person, place, and time. Coordination normal.  Skin: Skin is warm and dry. No erythema.  Psychiatric: She has a normal mood and affect. Her behavior is normal. Thought content normal.     Assessment/Plan The patient appears to have acute appendicitis by CT scan but no white count and no fever.  Her pain is only intermittent.  Given her significant cardiac and stroke history it may be safer for her to be treated with antibiotics and close monitoring.  We can have her cardiologist see her in the morning.  If she does not improve then she may come to need surgery.  She is in agreement with this treatment plan and would like to not have surgery if at all possible.  Merrie Roof, MD 05/10/2017, 8:20 PM

## 2017-05-10 NOTE — ED Provider Notes (Signed)
[facility] DEPT Provider Note   CSN: [id number] Arrival date & time: 05/10/17  1640     History   Chief Complaint Chief Complaint  Patient presents with  . Appendicitis    HPI Brandy Paul is a 72 y.o. female.  HPI  72 y.o. female with history of strokes, coronary artery spasm with recent normal myocardial perfusion study (August 2017) comes into the ER from home with a confirmed appendicitis. Pt has been having abd pain x 1.5 weeks, the pain has been intermittent and finally patient decided to see her pcp today. The CT scan confirmed appendicitis, and pt was asked to come to the ER. Pt denies any n/v/f/c and the pain is intermittent. Pain is located in the RLQ.     Past Medical History:  Diagnosis Date  . Breast cancer ([place]) 2005   left  . COPD (chronic obstructive pulmonary disease) (HCC)    hx of tobacco use  . Coronary artery spasm ([place])   . Depression   . GERD (gastroesophageal reflux disease)   . Hyperlipemia   . IDA (iron deficiency anemia)    last iron infusion 4 months ago  . Osteoporosis   . 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 Prediabetes    hx of  . Rash    under right breast and groin line using nizoral ointment to q day per dermatology saw few weeks ago  . Stress incontinence wers depends  . Stroke [facility]) 2013   paralytic syndrome of dominant right side  . Toe fracture, left     Patient Active Problem List   Diagnosis Date Noted  . DOE (dyspnea on exertion) 04/22/2017  . Anemia 12/02/2015  . Ovarian mass, left 11/04/2015  . Pelvic mass in female 11/04/2015  . Iron deficiency anemia 04/28/2015  . Nonspecific abnormal finding in stool contents 04/06/2015  . Anemia, iron deficiency 04/06/2015  . Encounter for long-term (current) use of antiplatelets/antithrombotics 04/06/2015  . Hyperlipidemia 11/24/2014  . Hemiparesis affecting right side as late effect of cerebrovascular accident ([place]) 11/24/2014  .  Coronary artery spasm ([place]) 10/09/2013  . 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 Gastroesophageal reflux disease without esophagitis 10/09/2013  . Centrilobular emphysema ([place]) 10/09/2013    Past Surgical History:  Procedure Laterality Date  . ABDOMINAL HYSTERECTOMY     complete  . COLONOSCOPY WITH PROPOFOL N/A 12/03/2015   Procedure: COLONOSCOPY WITH PROPOFOL;  Surgeon: Mauri Pole, MD;  [facility];  Service: Endoscopy;  Laterality: N/A;  tba before procdcedure  . MASTECTOMY Left    1 lymph node removed  . RADIAL KERATOTOMY Bilateral   . ROBOTIC ASSISTED BILATERAL SALPINGO OOPHERECTOMY Bilateral 11/04/2015   Procedure: XI ROBOTIC ASSISTED BILATERAL SALPINGO OOPHORECTOMY;  Surgeon: Everitt Amber, MD;  Location: [facility];  Service: Gynecology;  Laterality: Bilateral;  . TONSILLECTOMY      OB History    No data available       Home Medications    Prior to Admission medications   Medication Sig Start Date End Date Taking? Authorizing Provider  acetaminophen (TYLENOL) 500 MG tablet Take 500 mg by mouth daily as needed.   Yes [provider]  aspirin 81 MG tablet Take 81 mg by mouth every evening.    Yes [provider]  atorvastatin (LIPITOR) 20 MG tablet Take 20 mg by mouth at bedtime.  10/31/14  Yes [provider]  Calcium Carbonate-Vitamin D (OSCAL 500/200 D-3 PO) Take 1 tablet by mouth every evening.    Yes [provider]  clopidogrel (PLAVIX) 75 MG tablet Take 1 tablet (75 mg total) by mouth at bedtime. 11/07/15  Yes Cross, Melissa D, NP  ezetimibe (ZETIA) 10 MG tablet Take 10 mg by mouth at bedtime.    Yes [provider]  isosorbide mononitrate (IMDUR) 60 MG 24 hr tablet TAKE 1 TABLET BY MOUTH DAILY 04/25/17  Yes Belva Crome, MD  RABEprazole (ACIPHEX) 20 MG tablet Take 20 mg by mouth at bedtime.  10/29/14  Yes [provider]    sertraline (ZOLOFT) 25 MG tablet Take 50 mg by mouth at bedtime.  11/17/14  Yes [provider]  tiotropium (SPIRIVA HANDIHALER) 18 MCG inhalation capsule Place 18 mcg into inhaler and inhale daily.    Yes [provider]  nitroGLYCERIN (NITROSTAT) 0.4 MG SL tablet Place 0.4 mg under the tongue every 5 (five) minutes as needed for chest pain (Call 911 at 3rd dose within 15 minutes).    [provider]    Family History Family History  Problem Relation Age of Onset  . CVA Mother   . Liver cancer Mother   . Heart attack Sister   . Stroke Maternal Grandfather   . Hypertension Sister   . Diabetes Sister   . Arthritis Sister     Social History Social History   Tobacco Use  . Smoking status: Former Smoker    Types: Cigarettes    Last attempt to quit: 02/17/1994    Years since quitting: 23.2  . Smokeless tobacco: Never Used  Substance Use Topics  . Alcohol use: No    Alcohol/week: 0.0 oz  . Drug use: No     Allergies   Ceclor [cefaclor]   Review of Systems Review of Systems  Constitutional: Negative for activity change and fever.  Gastrointestinal: Positive for abdominal pain. Negative for nausea and vomiting.  Allergic/Immunologic: Negative for immunocompromised state.  Hematological: Does not bruise/bleed easily.  All other systems reviewed and are negative.    Physical Exam Updated Vital Signs BP 131/63 (BP Location: Right Arm)   Pulse 73   Temp 97.7 F (36.5 C) (Oral)   Resp 16   SpO2 95%   Physical Exam  Constitutional: She is oriented to person, place, and time. She appears well-developed.  HENT:  Head: Normocephalic and atraumatic.  Eyes: EOM are normal.  Neck: Normal range of motion. Neck supple.  Cardiovascular: Normal rate.  Pulmonary/Chest: Effort normal.  Abdominal: Soft. She exhibits no distension. There is tenderness.  Neurological: She is alert and oriented to person, place, and time.  Skin: Skin is warm and dry.   Nursing note and vitals reviewed.    ED Treatments / Results  Labs (all labs ordered are listed, but only abnormal results are displayed) Labs Reviewed  COMPREHENSIVE METABOLIC PANEL - Abnormal; Notable for the following components:      Result Value   Glucose, Bld 135 (*)    All other components within normal limits  CBC - Abnormal; Notable for the following components:   RBC 5.28 (*)    MCH 23.7 (*)    RDW 23.0 (*)    All other components within normal limits  URINALYSIS, ROUTINE W REFLEX MICROSCOPIC - Abnormal; Notable for the following components:   Specific Gravity, Urine >1.046 (*)    Leukocytes, UA SMALL (*)    Squamous Epithelial / LPF 0-5 (*)  All other components within normal limits  LIPASE, BLOOD    EKG  EKG Interpretation None       Radiology Ct Abdomen Pelvis W Contrast  Result Date: 05/10/2017 CLINICAL DATA:  Acute right lower quadrant abdominal pain. EXAM: CT ABDOMEN AND PELVIS WITH CONTRAST TECHNIQUE: Multidetector CT imaging of the abdomen and pelvis was performed using the standard protocol following bolus administration of intravenous contrast. CONTRAST:  125 mL of Isovue-300 intravenously. COMPARISON:  CT scan of August 22, 2015. FINDINGS: Lower chest: Moderate size sliding-type hiatal hernia is noted. Visualized lung bases are unremarkable. Hepatobiliary: No focal liver abnormality is seen. No gallstones, gallbladder wall thickening, or biliary dilatation. Pancreas: Unremarkable. No pancreatic ductal dilatation or surrounding inflammatory changes. Spleen: Normal in size without focal abnormality. Adrenals/Urinary Tract: Left renal atrophy is noted. Adrenal glands appear normal. No hydronephrosis or renal obstruction is noted. No renal or ureteral calculi are noted. Urinary bladder is unremarkable. Stomach/Bowel: The stomach appears normal. There is no evidence of bowel obstruction. Sigmoid diverticulosis is noted without inflammation. Compared to prior exam,  the appendix is enlarged with surrounding inflammation concerning for acute appendicitis. Appendix: Location: Retrocecal. Diameter: 8 mm. Appendicolith: No. Mucosal hyper-enhancement: No. Extraluminal gas: No. Periappendiceal collection: No. Vascular/Lymphatic: Aortic atherosclerosis. No enlarged abdominal or pelvic lymph nodes. Reproductive: Status post hysterectomy. No adnexal masses. Other: No abdominal wall hernia or abnormality. No abdominopelvic ascites. Musculoskeletal: No acute or significant osseous findings. IMPRESSION: Findings consistent with acute appendicitis. No abscess is noted. It is retrocecal in position. These results will be called to the ordering clinician or representative by the Radiologist Assistant, and communication documented in the PACS or zVision Dashboard. Moderate size sliding-type hiatal hernia. Sigmoid diverticulosis without inflammation. Aortic Atherosclerosis (ICD10-I70.0). Electronically Signed   By: Marijo Conception, M.D.   On: 05/10/2017 14:25    Procedures Procedures (including critical care time)  Medications Ordered in ED Medications  morphine 4 MG/ML injection 4 mg (not administered)  lactated ringers bolus 1,000 mL (1,000 mLs Intravenous New Bag/Given 05/10/17 1854)  piperacillin-tazobactam (ZOSYN) IVPB 3.375 g (0 g Intravenous Stopped 05/10/17 1924)     Initial Impression / Assessment and Plan / ED Course  I have reviewed the triage vital signs and the nursing notes.  Pertinent labs & imaging results that were available during my care of the patient were reviewed by me and considered in my medical decision making (see chart for details).     72 y.o with hx of strokes and coronary vasospasms comes in with cc of abd pain. Abd pain has been intermittent, and there is no associated fevers, nausea, emesis. Pt is immunocompetent. She is not septic. We will consult surgery. Pt has cephalosporin allergies, so we will give her zosyn. Labs reviewed. Dr. Marlou Starks will  see the patient. Results from the ER and plan for surgical evaluation discussed with the patient and her husband.  Final Clinical Impressions(s) / ED Diagnoses   Final diagnoses:  Acute appendicitis, uncomplicated    ED Discharge Orders    None       Varney Biles, MD 05/10/17 2147

## 2017-05-10 NOTE — ED Notes (Signed)
Bed: WA09 Expected date:  Expected time:  Means of arrival:  Comments: Hold for [employer]

## 2017-05-11 DIAGNOSIS — K3580 Unspecified acute appendicitis: Principal | ICD-10-CM

## 2017-05-11 DIAGNOSIS — I2510 Atherosclerotic heart disease of native coronary artery without angina pectoris: Secondary | ICD-10-CM

## 2017-05-11 DIAGNOSIS — Z01810 Encounter for preprocedural cardiovascular examination: Secondary | ICD-10-CM

## 2017-05-11 DIAGNOSIS — E785 Hyperlipidemia, unspecified: Secondary | ICD-10-CM

## 2017-05-11 LAB — CBC
HCT: 34 % — ABNORMAL LOW (ref 36.0–46.0)
HEMOGLOBIN: 10.3 g/dL — AB (ref 12.0–15.0)
MCH: 24.2 pg — ABNORMAL LOW (ref 26.0–34.0)
MCHC: 30.3 g/dL (ref 30.0–36.0)
MCV: 79.8 fL (ref 78.0–100.0)
Platelets: 142 10*3/uL — ABNORMAL LOW (ref 150–400)
RBC: 4.26 MIL/uL (ref 3.87–5.11)
RDW: 23.3 % — ABNORMAL HIGH (ref 11.5–15.5)
WBC: 5.4 10*3/uL (ref 4.0–10.5)

## 2017-05-11 LAB — BASIC METABOLIC PANEL
ANION GAP: 9 (ref 5–15)
BUN: 18 mg/dL (ref 6–20)
CALCIUM: 8.5 mg/dL — AB (ref 8.9–10.3)
CO2: 25 mmol/L (ref 22–32)
Chloride: 107 mmol/L (ref 101–111)
Creatinine, Ser: 0.97 mg/dL (ref 0.44–1.00)
GFR, EST NON AFRICAN AMERICAN: 57 mL/min — AB (ref >60)
GLUCOSE: 110 mg/dL — AB (ref 65–99)
Potassium: 4.1 mmol/L (ref 3.5–5.1)
SODIUM: 141 mmol/L (ref 135–145)

## 2017-05-11 NOTE — Consult Note (Addendum)
Cardiology Consultation:   Patient ID: DYNASTI Paul; [id number]; [birth year]   Admit date: 05/10/2017 Date of Consult: 05/11/2017  Primary Care Provider: Velna Hatchet, MD Primary Cardiologist: Dr. Tamala Julian  Primary Electrophysiologist:  None   Patient Profile:   Brandy Paul is a 72 y.o. 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  History of Present Illness:   [name] is a 72 y.o. 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 She states she was in her usual state of health until 1 week ago when she developed intermittent RLQ abdominal pain. She denies associated fever, chills, nausea, vomiting, or diarrhea. She presented to per PCP 2/21 with these complaints and underwent an outpatient CT A/P revealing acute appendicitis without evidence of perforation or abscess. She then presented to [facility] ED for further management.  From a cardiac standpoint she states she has been doing well. She was last evaluated by Dr. Tamala Julian 04/23/17 for a routine appointment and was without complaints. She denies recent episodes of CP, SOB, or DOE. She states she has not had to use her SL Nitro in years. She is mostly sedentary, sitting in a recliner for the majority of the day. She denies palpitations, orthopnea, PND, LE edema, dizziness, lightheadedness, or syncope. Her last NST 10/2015 was normal. Last Echo 10/2010 with LVEF 60% and mild TR.  Hospital course: afebrile, BP stable, satting well on room air. Labs notable for electrolytes wnl, Cr 0.89, Hgb 12.5>10.3 (baseline 10.2), PLT 170>142. Patient was started on IV antibiotics for  appendicitis and admitted to surgery for further management. Cardiology consulted for pre-operative risk assessment.  Past Medical History:  Diagnosis Date  . Breast cancer ([place]) 2005   left  . COPD (chronic obstructive pulmonary disease) (HCC)    hx of tobacco use  . Coronary artery spasm ([place])   . Depression   . GERD (gastroesophageal reflux disease)   . Hyperlipemia   . IDA (iron deficiency anemia)    last iron infusion 4 months ago  . Osteoporosis   . 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 Prediabetes    hx of  . Rash    under right breast and groin line using nizoral ointment to q day per dermatology saw few weeks ago  . Stress incontinence wers depends  . Stroke [facility]) 2013   paralytic syndrome of dominant right side  . Toe fracture, left     Past Surgical History:  Procedure Laterality Date  . ABDOMINAL HYSTERECTOMY     complete  . COLONOSCOPY WITH PROPOFOL N/A 12/03/2015   Procedure: COLONOSCOPY WITH PROPOFOL;  Surgeon: Mauri Pole, MD;  Location: [facility] ENDOSCOPY;  Service: Endoscopy;  Laterality: N/A;  tba before procdcedure  . MASTECTOMY Left    1 lymph node removed  . RADIAL KERATOTOMY Bilateral   . ROBOTIC ASSISTED BILATERAL SALPINGO OOPHERECTOMY Bilateral 11/04/2015   Procedure: XI ROBOTIC ASSISTED BILATERAL SALPINGO OOPHORECTOMY;  Surgeon: Everitt Amber, MD;  Location: [facility] ORS;  Service: Gynecology;  Laterality: Bilateral;  . TONSILLECTOMY       Home Medications:  Prior to Admission medications   Medication Sig Start Date  End Date Taking? Authorizing Provider  acetaminophen (TYLENOL) 500 MG tablet Take 500 mg by mouth daily as needed.   Yes [provider]  aspirin 81 MG tablet Take 81 mg by mouth every evening.    Yes [provider]  atorvastatin (LIPITOR) 20 MG tablet Take 20 mg by mouth at bedtime.  10/31/14  Yes [provider]  Calcium Carbonate-Vitamin D (OSCAL 500/200 D-3 PO) Take 1 tablet by mouth every evening.    Yes  [provider]  clopidogrel (PLAVIX) 75 MG tablet Take 1 tablet (75 mg total) by mouth at bedtime. 11/07/15  Yes Cross, Melissa D, NP  ezetimibe (ZETIA) 10 MG tablet Take 10 mg by mouth at bedtime.    Yes [provider]  isosorbide mononitrate (IMDUR) 60 MG 24 hr tablet TAKE 1 TABLET BY MOUTH DAILY 04/25/17  Yes Belva Crome, MD  RABEprazole (ACIPHEX) 20 MG tablet Take 20 mg by mouth at bedtime.  10/29/14  Yes [provider]  sertraline (ZOLOFT) 25 MG tablet Take 50 mg by mouth at bedtime.  11/17/14  Yes [provider]  tiotropium (SPIRIVA HANDIHALER) 18 MCG inhalation capsule Place 18 mcg into inhaler and inhale daily.    Yes [provider]  nitroGLYCERIN (NITROSTAT) 0.4 MG SL tablet Place 0.4 mg under the tongue every 5 (five) minutes as needed for chest pain (Call 911 at 3rd dose within 15 minutes).    [provider]    Inpatient Medications: Scheduled Meds: . heparin  5,000 Units Subcutaneous Q8H  . isosorbide mononitrate  60 mg Oral Daily  . pantoprazole (PROTONIX) IV  40 mg Intravenous QHS  . tiotropium  18 mcg Inhalation Daily   Continuous Infusions: . dextrose 5 % and 0.9 % NaCl with KCl 20 mEq/L 50 mL/hr at 05/11/17 0020  . piperacillin-tazobactam (ZOSYN)  IV 3.375 g (05/11/17 0546)   PRN Meds: morphine injection, nitroGLYCERIN, ondansetron **OR** ondansetron (ZOFRAN) IV  Allergies:    Allergies  Allergen Reactions  . Ceclor [Cefaclor] Other (See Comments)    REACTION: "SEVERE HEADACHE"    Social History:   Social History   Socioeconomic History  . Marital status: Married    Spouse name: Not on file  . Number of children: 2  . Years of education: Not on file  . Highest education level: Not on file  Social Needs  . Financial resource strain: Not on file  . Food insecurity - worry: Not on file  . Food insecurity - inability: Not on file  . Transportation needs - medical: Not on file  . Transportation needs -  non-medical: Not on file  Occupational History  . Occupation: retired  Tobacco Use  . Smoking status: Former Smoker    Types: Cigarettes    Last attempt to quit: 02/17/1994    Years since quitting: 23.2  . Smokeless tobacco: Never Used  Substance and Sexual Activity  . Alcohol use: No    Alcohol/week: 0.0 oz  . Drug use: No  . Sexual activity: No  Other Topics Concern  . Not on file  Social History Narrative  . Not on file    Family History:    Family History  Problem Relation Age of Onset  . CVA Mother   . Liver cancer Mother   . Heart attack Sister   . Stroke Maternal Grandfather   . Hypertension Sister   . Diabetes Sister   . Arthritis Sister      ROS:  Please  see the history of present illness.   All other ROS reviewed and negative.     Physical Exam/Data:   Vitals:   05/10/17 2224 05/10/17 2327 05/11/17 0020 05/11/17 0559  BP: 128/63 (!) 148/63  131/64  Pulse: 63 73  76  Resp: 16 14  17   Temp:  98.1 F (36.7 C)  (!) 97.5 F (36.4 C)  TempSrc:  Oral  Oral  SpO2: 95% 98%  98%  Weight:   210 lb 15.7 oz (95.7 kg)   Height:   5\' 3"  (1.6 m)     Intake/Output Summary (Last 24 hours) at 05/11/2017 0813 Last data filed at 05/11/2017 0809 Gross per 24 hour  Intake 1425 ml  Output -  Net 1425 ml   Filed Weights   05/11/17 0020  Weight: 210 lb 15.7 oz (95.7 kg)   Body mass index is 37.37 kg/m.  General:  Chronically ill appearing female laying in bed in no acute distress HEENT: sclera anicteric  Neck: no JVD Vascular: No carotid bruits; distal pulses 2+ bilaterally Cardiac:  normal S1, S2; RRR; no murmur, gallops, or rubs appreciated Lungs:  clear to auscultation bilaterally, no wheezing, rhonchi or rales  Abd: NABS, obese, soft, TTP RLQ, no hepatomegaly Ext: no edema Musculoskeletal: right arm contracted; right sided weakness (chronic) Skin: warm and dry  Neuro:  A&O x3 Psych:  Normal affect   EKG:  The EKG was personally reviewed and  demonstrates:  None for this admission. EKG 04/23/17 with NSR, isolated TWI III; otherwise no STE/D - no significant change from previous Telemetry:  Telemetry was personally reviewed and demonstrates:  Not on telemetry  Relevant CV Studies: NST 10/2015:  Nuclear stress EF: 60%.  There was no ST segment deviation noted during stress.  The study is normal.  This is a low risk study.  The left ventricular ejection fraction is normal (55-65%).   Normal resting and stress perfusion. No ischemia or infarction EF 60%   Laboratory Data:  Chemistry Recent Labs  Lab 05/10/17 1730 05/11/17 0538  NA 138 141  K 3.5 4.1  CL 101 107  CO2 26 25  GLUCOSE 135* 110*  BUN 16 18  CREATININE 0.89 0.97  CALCIUM 9.1 8.5*  GFRNONAA >60 57*  GFRAA >60 >60  ANIONGAP 11 9    Recent Labs  Lab 05/10/17 1730  PROT 7.2  ALBUMIN 3.7  AST 23  ALT 17  ALKPHOS 99  BILITOT 0.5   Hematology Recent Labs  Lab 05/10/17 1730 05/11/17 0538  WBC 6.2 5.4  RBC 5.28* 4.26  HGB 12.5 10.3*  HCT 41.7 34.0*  MCV 79.0 79.8  MCH 23.7* 24.2*  MCHC 30.0 30.3  RDW 23.0* 23.3*  PLT 170 142*   Cardiac EnzymesNo results for input(s): TROPONINI in the last 168 hours. No results for input(s): TROPIPOC in the last 168 hours.  BNPNo results for input(s): BNP, PROBNP in the last 168 hours.  DDimer No results for input(s): DDIMER in the last 168 hours.  Radiology/Studies:  Ct Abdomen Pelvis W Contrast  Result Date: 05/10/2017 CLINICAL DATA:  Acute right lower quadrant abdominal pain. EXAM: CT ABDOMEN AND PELVIS WITH CONTRAST TECHNIQUE: Multidetector CT imaging of the abdomen and pelvis was performed using the standard protocol following bolus administration of intravenous contrast. CONTRAST:  125 mL of Isovue-300 intravenously. COMPARISON:  CT scan of August 22, 2015. FINDINGS: Lower chest: Moderate size sliding-type hiatal hernia is noted. Visualized lung bases are unremarkable. Hepatobiliary: No focal liver  abnormality is seen. No gallstones, gallbladder wall thickening, or biliary dilatation. Pancreas: Unremarkable. No pancreatic ductal dilatation or surrounding inflammatory changes. Spleen: Normal in size without focal abnormality. Adrenals/Urinary Tract: Left renal atrophy is noted. Adrenal glands appear normal. No hydronephrosis or renal obstruction is noted. No renal or ureteral calculi are noted. Urinary bladder is unremarkable. Stomach/Bowel: The stomach appears normal. There is no evidence of bowel obstruction. Sigmoid diverticulosis is noted without inflammation. Compared to prior exam, the appendix is enlarged with surrounding inflammation concerning for acute appendicitis. Appendix: Location: Retrocecal. Diameter: 8 mm. Appendicolith: No. Mucosal hyper-enhancement: No. Extraluminal gas: No. Periappendiceal collection: No. Vascular/Lymphatic: Aortic atherosclerosis. No enlarged abdominal or pelvic lymph nodes. Reproductive: Status post hysterectomy. No adnexal masses. Other: No abdominal wall hernia or abnormality. No abdominopelvic ascites. Musculoskeletal: No acute or significant osseous findings. IMPRESSION: Findings consistent with acute appendicitis. No abscess is noted. It is retrocecal in position. These results will be called to the ordering clinician or representative by the Radiologist Assistant, and communication documented in the PACS or zVision Dashboard. Moderate size sliding-type hiatal hernia. Sigmoid diverticulosis without inflammation. Aortic Atherosclerosis (ICD10-I70.0). Electronically Signed   By: Marijo Conception, M.D.   On: 05/10/2017 14:25    Assessment and Plan:   1. Preoperative risk assessment: patient is without anginal complaints and has not needed to take SL nitro for years. EKG from 04/23/17 is non-ischemic and without acute changes. Last NST 10/2015 was normal with reported LVEF 60%. Patient is mostly sedentary with inadequate METS to assess for exertional angina.  - Will  obtain EKG this admission - if no significant change, do not anticipate further cardiac work-up.  - Based on the revised cardiac risk index, patient has a 10.1% risk of major cardiac event given history of coronary spasm associated with myocardial injury and prior CVA.  - If surgery needed in the future, patient is at acceptable risk for moderate risk surgery. No invasive cardiac work-up indicated at this time.   2. Acute appendicitis: Outpatient CT A/P with evidence of acute appendicitis without abscess or perforation. Currently on IV antibiotics for conservative management. If no improvement in symptoms in the next 4-5 days, patient may require surgery.   - Continue management per primary team  3. Coronary artery spasms: patient is currently without anginal complaints and has not required use of SL Nitro in years.  - Continue imdur and prn SL Nitro - ASA on hold in case of procedure  4. History of CVA with residual right sided hemiparesis: at baseline - Plavix and ASA on hold in case of procedure - resume when cleared by surgery  5. Dyslipidemia:  - Resume zetia and atorvastatin once cleared by surgery/upon discharge  6. COPD: no evidence of acute exacerbation. Has a chronic cough - Continue spiriva  For questions or updates, please contact [facility] Please consult [email or web address] for contact info under Cardiology/STEMI.   Signed, Abigail Butts, PA-C  05/11/2017 8:13 AM [phone]  Patient examined chart reviewed Discussed care with patient and PA. No active cardiac symptoms She is on plavix For stroke not CAD. She has no history of fixed epicardial CAD just spasm EF is normal Exam with obese white Female clear lungs no murmur mild RLQ pain to palpation. She is clear to hold plavix and have abdominal surgery   Jenkins Rouge

## 2017-05-11 NOTE — Progress Notes (Signed)
Appendicitis   Subjective: Pt with min RLQ pain, no other complaints  Objective: Vital signs in last 24 hours: Temp:  [97.5 F (36.4 C)-98.1 F (36.7 C)] 97.5 F (36.4 C) (02/22 0559) Pulse Rate:  [63-90] 76 (02/22 0559) Resp:  [14-18] 17 (02/22 0559) BP: (128-148)/(63-65) 131/64 (02/22 0559) SpO2:  [94 %-98 %] 98 % (02/22 0559) Weight:  [95.7 kg (210 lb 15.7 oz)] 95.7 kg (210 lb 15.7 oz) (02/22 0020) Last BM Date: 05/10/17  Intake/Output from previous day: 02/21 0701 - 02/22 0700 In: 1425 [P.O.:60; I.V.:315; IV Piggyback:1050] Out: -  Intake/Output this shift: No intake/output data recorded.  General appearance: alert and cooperative GI: mild RLQ TTP  Lab Results:  Results for orders placed or performed during the hospital encounter of 05/10/17 (from the past 24 hour(s))  Lipase, blood     Status: None   Collection Time: 05/10/17  5:30 PM  Result Value Ref Range   Lipase 37 11 - 51 U/L  Comprehensive metabolic panel     Status: Abnormal   Collection Time: 05/10/17  5:30 PM  Result Value Ref Range   Sodium 138 135 - 145 mmol/L   Potassium 3.5 3.5 - 5.1 mmol/L   Chloride 101 101 - 111 mmol/L   CO2 26 22 - 32 mmol/L   Glucose, Bld 135 (H) 65 - 99 mg/dL   BUN 16 6 - 20 mg/dL   Creatinine, Ser 0.89 0.44 - 1.00 mg/dL   Calcium 9.1 8.9 - 10.3 mg/dL   Total Protein 7.2 6.5 - 8.1 g/dL   Albumin 3.7 3.5 - 5.0 g/dL   AST 23 15 - 41 U/L   ALT 17 14 - 54 U/L   Alkaline Phosphatase 99 38 - 126 U/L   Total Bilirubin 0.5 0.3 - 1.2 mg/dL   GFR calc non Af Amer >60 >60 mL/min   GFR calc Af Amer >60 >60 mL/min   Anion gap 11 5 - 15  CBC     Status: Abnormal   Collection Time: 05/10/17  5:30 PM  Result Value Ref Range   WBC 6.2 4.0 - 10.5 K/uL   RBC 5.28 (H) 3.87 - 5.11 MIL/uL   Hemoglobin 12.5 12.0 - 15.0 g/dL   HCT 41.7 36.0 - 46.0 %   MCV 79.0 78.0 - 100.0 fL   MCH 23.7 (L) 26.0 - 34.0 pg   MCHC 30.0 30.0 - 36.0 g/dL   RDW 23.0 (H) 11.5 - 15.5 %   Platelets 170 150 -  400 K/uL  Urinalysis, Routine w reflex microscopic     Status: Abnormal   Collection Time: 05/10/17  7:50 PM  Result Value Ref Range   Color, Urine YELLOW YELLOW   APPearance CLEAR CLEAR   Specific Gravity, Urine >1.046 (H) 1.005 - 1.030   pH 5.0 5.0 - 8.0   Glucose, UA NEGATIVE NEGATIVE mg/dL   Hgb urine dipstick NEGATIVE NEGATIVE   Bilirubin Urine NEGATIVE NEGATIVE   Ketones, ur NEGATIVE NEGATIVE mg/dL   Protein, ur NEGATIVE NEGATIVE mg/dL   Nitrite NEGATIVE NEGATIVE   Leukocytes, UA SMALL (A) NEGATIVE   RBC / HPF 0-5 0 - 5 RBC/hpf   WBC, UA 0-5 0 - 5 WBC/hpf   Bacteria, UA NONE SEEN NONE SEEN   Squamous Epithelial / LPF 0-5 (A) NONE SEEN  Basic metabolic panel     Status: Abnormal   Collection Time: 05/11/17  5:38 AM  Result Value Ref Range   Sodium 141 135 - 145  mmol/L   Potassium 4.1 3.5 - 5.1 mmol/L   Chloride 107 101 - 111 mmol/L   CO2 25 22 - 32 mmol/L   Glucose, Bld 110 (H) 65 - 99 mg/dL   BUN 18 6 - 20 mg/dL   Creatinine, Ser 0.97 0.44 - 1.00 mg/dL   Calcium 8.5 (L) 8.9 - 10.3 mg/dL   GFR calc non Af Amer 57 (L) >60 mL/min   GFR calc Af Amer >60 >60 mL/min   Anion gap 9 5 - 15  CBC     Status: Abnormal   Collection Time: 05/11/17  5:38 AM  Result Value Ref Range   WBC 5.4 4.0 - 10.5 K/uL   RBC 4.26 3.87 - 5.11 MIL/uL   Hemoglobin 10.3 (L) 12.0 - 15.0 g/dL   HCT 34.0 (L) 36.0 - 46.0 %   MCV 79.8 78.0 - 100.0 fL   MCH 24.2 (L) 26.0 - 34.0 pg   MCHC 30.3 30.0 - 36.0 g/dL   RDW 23.3 (H) 11.5 - 15.5 %   Platelets 142 (L) 150 - 400 K/uL     Studies/Results Radiology     MEDS, Scheduled . heparin  5,000 Units Subcutaneous Q8H  . isosorbide mononitrate  60 mg Oral Daily  . pantoprazole (PROTONIX) IV  40 mg Intravenous QHS  . tiotropium  18 mcg Inhalation Daily     Assessment: Appendicitis  Plan: Pt on plavix with significant heart history.  Will have her cardiologist evaluate her.  Will hold plavix but cont abx.  If symptoms worsen or do not improve  over the next 4-5 days, she may need surgery.  Anticipate this will resolve with medical management though.    LOS: 1 day    Rosario Adie, MD [facility], [place]   05/11/2017 8:20 AM

## 2017-05-12 MED ORDER — SERTRALINE HCL 50 MG PO TABS
50.0000 mg | ORAL_TABLET | Freq: Every day | ORAL | Status: DC
  Administered 2017-05-12 – 2017-05-14 (×3): 50 mg via ORAL
  Filled 2017-05-12 (×3): qty 1

## 2017-05-12 MED ORDER — EZETIMIBE 10 MG PO TABS
10.0000 mg | ORAL_TABLET | Freq: Every day | ORAL | Status: DC
  Administered 2017-05-12 – 2017-05-14 (×3): 10 mg via ORAL
  Filled 2017-05-12 (×3): qty 1

## 2017-05-12 MED ORDER — PANTOPRAZOLE SODIUM 40 MG PO TBEC
40.0000 mg | DELAYED_RELEASE_TABLET | Freq: Every day | ORAL | Status: DC
  Administered 2017-05-12 – 2017-05-14 (×3): 40 mg via ORAL
  Filled 2017-05-12 (×3): qty 1

## 2017-05-12 NOTE — Progress Notes (Signed)
Patient ID: Brandy Paul, female   DOB: May 21, 1945, 72 y.o.   MRN: [id number]   Acute Care Surgery Service Progress Note:    Chief Complaint/Subjective: Denies abd pain, n/v.  Some flatus Doesn't talk much Eating solid food  Objective: Vital signs in last 24 hours: Temp:  [98.2 F (36.8 C)-98.8 F (37.1 C)] 98.2 F (36.8 C) (02/23 0607) Pulse Rate:  [68-71] 71 (02/23 0607) Resp:  [15-16] 16 (02/23 0607) BP: (105-124)/(52-59) 107/52 (02/23 0607) SpO2:  [93 %-98 %] 93 % (02/23 0810) Last BM Date: 05/12/17  Intake/Output from previous day: 02/22 0701 - 02/23 0700 In: 540 [P.O.:540] Out: -  Intake/Output this shift: No intake/output data recorded.  Lungs: cta, nonlabored  Cardiovascular: reg  Abd: obese, soft, nd, essentially nontender  Extremities: no edema, +SCDs  Neuro: alert, nonfocal; residual Rt weakness  Lab Results: CBC  Recent Labs    05/10/17 1730 05/11/17 0538  WBC 6.2 5.4  HGB 12.5 10.3*  HCT 41.7 34.0*  PLT 170 142*   BMET Recent Labs    05/10/17 1730 05/11/17 0538  NA 138 141  K 3.5 4.1  CL 101 107  CO2 26 25  GLUCOSE 135* 110*  BUN 16 18  CREATININE 0.89 0.97  CALCIUM 9.1 8.5*   LFT Hepatic Function Latest Ref Rng & Units 05/10/2017 12/01/2015 11/02/2015  Total Protein 6.5 - 8.1 g/dL 7.2 6.7 7.2  Albumin 3.5 - 5.0 g/dL 3.7 3.2(L) 3.9  AST 15 - 41 U/L 23 16 18   ALT 14 - 54 U/L 17 11(L) 12(L)  Alk Phosphatase 38 - 126 U/L 99 79 86  Total Bilirubin 0.3 - 1.2 mg/dL 0.5 0.4 0.4  Bilirubin, Direct 0.1 - 0.5 mg/dL - - -   PT/INR No results for input(s): LABPROT, INR in the last 72 hours. ABG No results for input(s): PHART, HCO3 in the last 72 hours.  Invalid input(s): PCO2, PO2  Studies/Results:  Anti-infectives: Anti-infectives (From admission, onward)   Start     Dose/Rate Route Frequency Ordered Stop   05/10/17 2215  piperacillin-tazobactam (ZOSYN) IVPB 3.375 g     3.375 g 12.5 mL/hr over 240 Minutes Intravenous Every 8  hours 05/10/17 2210     05/10/17 1815  piperacillin-tazobactam (ZOSYN) IVPB 3.375 g     3.375 g 100 mL/hr over 30 Minutes Intravenous  Once 05/10/17 1801 05/10/17 1924      Medications: Scheduled Meds: . heparin  5,000 Units Subcutaneous Q8H  . isosorbide mononitrate  60 mg Oral Daily  . pantoprazole (PROTONIX) IV  40 mg Intravenous QHS  . tiotropium  18 mcg Inhalation Daily   Continuous Infusions: . dextrose 5 % and 0.9 % NaCl with KCl 20 mEq/L 50 mL/hr at 05/12/17 0551  . piperacillin-tazobactam (ZOSYN)  IV 3.375 g (05/12/17 0554)   PRN Meds:.morphine injection, nitroGLYCERIN, ondansetron **OR** ondansetron (ZOFRAN) IV  Assessment/Plan: Patient Active Problem List   Diagnosis Date Noted  . Appendicitis 05/10/2017  . DOE (dyspnea on exertion) 04/22/2017  . Anemia 12/02/2015  . Ovarian mass, left 11/04/2015  . Pelvic mass in female 11/04/2015  . Iron deficiency anemia 04/28/2015  . Nonspecific abnormal finding in stool contents 04/06/2015  . Anemia, iron deficiency 04/06/2015  . Encounter for long-term (current) use of antiplatelets/antithrombotics 04/06/2015  . Hyperlipidemia 11/24/2014  . Hemiparesis affecting right side as late effect of cerebrovascular accident ([place]) 11/24/2014  . Coronary artery spasm ([place]) 10/09/2013  . 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 DM (diabetes mellitus), type 2,  uncontrolled with complications ([place]) 58/85/0277  . Gastroesophageal reflux disease without esophagitis 10/09/2013  . Centrilobular emphysema ([place]) 10/09/2013   Appendicitis - non-operatively managed - no fever, no tachycardia, min to no pain on exam, cont IV abx H/o CVA - cont to hold plavix PT/OT Cont chemical vte prophylaxis H/o Coronary artery spasm - cleared by cardiology if need surgery  Disposition:  LOS: 2 days    Leighton Ruff. Redmond Pulling, MD, FACS General, Bariatric, & Minimally Invasive Surgery [phone] [facility]

## 2017-05-12 NOTE — Progress Notes (Signed)
The patient is receiving Protonix by the intravenous route.  Based on criteria approved by the Pharmacy and [place], the medication is being converted to the equivalent oral dose form.  These criteria include: -No active GI bleeding -Able to tolerate diet of full liquids (or better) or tube feeding -Able to tolerate other medications by the oral or enteral route  If you have any questions about this conversion, please contact the Pharmacy Department (phone [phone]).  Thank you. Eudelia Bunch, Pharm.D. [phone] 05/12/2017 12:58 PM

## 2017-05-13 LAB — BASIC METABOLIC PANEL
Anion gap: 11 (ref 5–15)
BUN: 15 mg/dL (ref 6–20)
CHLORIDE: 104 mmol/L (ref 101–111)
CO2: 25 mmol/L (ref 22–32)
Calcium: 8.5 mg/dL — ABNORMAL LOW (ref 8.9–10.3)
Creatinine, Ser: 0.94 mg/dL (ref 0.44–1.00)
GFR, EST NON AFRICAN AMERICAN: 59 mL/min — AB (ref >60)
Glucose, Bld: 104 mg/dL — ABNORMAL HIGH (ref 65–99)
POTASSIUM: 4.2 mmol/L (ref 3.5–5.1)
SODIUM: 140 mmol/L (ref 135–145)

## 2017-05-13 LAB — CBC
HCT: 35.4 % — ABNORMAL LOW (ref 36.0–46.0)
HEMOGLOBIN: 10.6 g/dL — AB (ref 12.0–15.0)
MCH: 23.8 pg — ABNORMAL LOW (ref 26.0–34.0)
MCHC: 29.9 g/dL — ABNORMAL LOW (ref 30.0–36.0)
MCV: 79.4 fL (ref 78.0–100.0)
PLATELETS: 149 10*3/uL — AB (ref 150–400)
RBC: 4.46 MIL/uL (ref 3.87–5.11)
RDW: 23.6 % — ABNORMAL HIGH (ref 11.5–15.5)
WBC: 5.4 10*3/uL (ref 4.0–10.5)

## 2017-05-13 NOTE — Progress Notes (Signed)
Patient ID: Brandy Paul, female   DOB: 1945/09/13, 72 y.o.   MRN: [id number]   Acute Care Surgery Service Progress Note:    Chief Complaint/Subjective: No n/v. Denies pain. Not a big talker  Objective: Vital signs in last 24 hours: Temp:  [98.1 F (36.7 C)-98.5 F (36.9 C)] 98.1 F (36.7 C) (02/24 0620) Pulse Rate:  [73-83] 75 (02/24 0620) Resp:  [13-16] 16 (02/24 0620) BP: (117-129)/(51-70) 122/70 (02/24 0620) SpO2:  [93 %-99 %] 94 % (02/24 0804) Last BM Date: 05/12/17  Intake/Output from previous day: 02/23 0701 - 02/24 0700 In: 2880 [P.O.:390; I.V.:2390; IV Piggyback:100] Out: 200 [Urine:200] Intake/Output this shift: No intake/output data recorded.  Lungs: cta, nonlabored  Cardiovascular: reg  Abd: soft, obese, nd, mild TTP in RLQ  Extremities: no edema, +SCDs  Neuro: alert, nonfocal, chronic Rt weakness  Lab Results: CBC  Recent Labs    05/11/17 0538 05/13/17 0547  WBC 5.4 5.4  HGB 10.3* 10.6*  HCT 34.0* 35.4*  PLT 142* 149*   BMET Recent Labs    05/11/17 0538 05/13/17 0547  NA 141 140  K 4.1 4.2  CL 107 104  CO2 25 25  GLUCOSE 110* 104*  BUN 18 15  CREATININE 0.97 0.94  CALCIUM 8.5* 8.5*   LFT Hepatic Function Latest Ref Rng & Units 05/10/2017 12/01/2015 11/02/2015  Total Protein 6.5 - 8.1 g/dL 7.2 6.7 7.2  Albumin 3.5 - 5.0 g/dL 3.7 3.2(L) 3.9  AST 15 - 41 U/L 23 16 18   ALT 14 - 54 U/L 17 11(L) 12(L)  Alk Phosphatase 38 - 126 U/L 99 79 86  Total Bilirubin 0.3 - 1.2 mg/dL 0.5 0.4 0.4  Bilirubin, Direct 0.1 - 0.5 mg/dL - - -   PT/INR No results for input(s): LABPROT, INR in the last 72 hours. ABG No results for input(s): PHART, HCO3 in the last 72 hours.  Invalid input(s): PCO2, PO2  Studies/Results:  Anti-infectives: Anti-infectives (From admission, onward)   Start     Dose/Rate Route Frequency Ordered Stop   05/10/17 2215  piperacillin-tazobactam (ZOSYN) IVPB 3.375 g     3.375 g 12.5 mL/hr over 240 Minutes Intravenous Every 8  hours 05/10/17 2210     05/10/17 1815  piperacillin-tazobactam (ZOSYN) IVPB 3.375 g     3.375 g 100 mL/hr over 30 Minutes Intravenous  Once 05/10/17 1801 05/10/17 1924      Medications: Scheduled Meds: . ezetimibe  10 mg Oral QHS  . heparin  5,000 Units Subcutaneous Q8H  . isosorbide mononitrate  60 mg Oral Daily  . pantoprazole  40 mg Oral QHS  . sertraline  50 mg Oral QHS  . tiotropium  18 mcg Inhalation Daily   Continuous Infusions: . dextrose 5 % and 0.9 % NaCl with KCl 20 mEq/L 50 mL/hr at 05/12/17 1747  . piperacillin-tazobactam (ZOSYN)  IV Stopped (05/13/17 0626)   PRN Meds:.morphine injection, nitroGLYCERIN, ondansetron **OR** ondansetron (ZOFRAN) IV  Assessment/Plan: Patient Active Problem List   Diagnosis Date Noted  . Appendicitis 05/10/2017  . DOE (dyspnea on exertion) 04/22/2017  . Anemia 12/02/2015  . Ovarian mass, left 11/04/2015  . Pelvic mass in female 11/04/2015  . Iron deficiency anemia 04/28/2015  . Nonspecific abnormal finding in stool contents 04/06/2015  . Anemia, iron deficiency 04/06/2015  . Encounter for long-term (current) use of antiplatelets/antithrombotics 04/06/2015  . Hyperlipidemia 11/24/2014  . Hemiparesis affecting right side as late effect of cerebrovascular accident ([place]) 11/24/2014  . Coronary artery spasm ([place]) 10/09/2013  .  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 DM (diabetes mellitus), type 2, uncontrolled with complications ([place]) 58/30/7460  . Gastroesophageal reflux disease without esophagitis 10/09/2013  . Centrilobular emphysema ([place]) 10/09/2013   Doing well. No fever, tachycardia, no wbc, minimal pain. Tolerating diet.  Cont non surgical mgmt of appendicitis with IV abx Cont to hold plavix (believe last dose was 2/20) Cont chemical vte prophylaxis KVO IVF Pt/ot consult pending  anticipate switching to oral abx Monday with likely dc on Tuesday  Disposition:  LOS: 3 days    Leighton Ruff. Redmond Pulling, MD, FACS General, Bariatric, &  Minimally Invasive Surgery [phone] [facility]

## 2017-05-13 NOTE — Evaluation (Signed)
Physical Therapy Evaluation Patient Details Name: Brandy Paul MRN: [id number] DOB: 27-Nov-1945 Today's Date: 05/13/2017   History of Present Illness  Brandy Paul is a 72 y.o. female with PMH of coronary artery spasm, HLD, CVA 2013 with residual right sided weakness and expressive aphasia, DM, breast cancer, COPD, GERD .  Pt with appendicitis.  Clinical Impression  Pt admitted as above and presenting with functional mobility limitations 2* residual R side deficits associated with CVA in 2013.  Pt should progress to baseline level of function to dc home with continued assist of husband.    Follow Up Recommendations No PT follow up    Equipment Recommendations  None recommended by PT    Recommendations for Other Services       Precautions / Restrictions Precautions Precautions: Fall Restrictions Weight Bearing Restrictions: No      Mobility  Bed Mobility Overal bed mobility: Needs Assistance Bed Mobility: Supine to Sit     Supine to sit: Min guard     General bed mobility comments: Increased time with HOB 30 degrees  Transfers Overall transfer level: Needs assistance Equipment used: Quad cane Transfers: Sit to/from [employer] Sit to Stand: Min assist;Min guard Stand pivot transfers: Min assist;Min guard       General transfer comment: quad cane. Pts husband A with all ADL activity  Ambulation/Gait Ambulation/Gait assistance: Min assist;Min guard Ambulation Distance (Feet): 88 Feet Assistive device: Quad cane Gait Pattern/deviations: Step-through pattern;Decreased step length - left;Trunk flexed Gait velocity: decr Gait velocity interpretation: Below normal speed for age/gender General Gait Details: circumduction R LE 2* increased tone and lack of DF  Stairs            Wheelchair Mobility    Modified Rankin (Stroke Patients Only)       Balance Overall balance assessment: Needs assistance Sitting-balance support: No upper  extremity supported;Feet supported Sitting balance-Leahy Scale: Good     Standing balance support: Single extremity supported Standing balance-Leahy Scale: Fair                               Pertinent Vitals/Pain Pain Assessment: No/denies pain    Home Living Family/patient expects to be discharged to:: Private residence Living Arrangements: Spouse/significant other Available Help at Discharge: Family Type of Home: House Home Access: Level entry     Home Layout: One level Home Equipment: Other (comment);Cane - quad;Wheelchair - manual(upright RW)      Prior Function Level of Independence: Needs assistance   Gait / Transfers Assistance Needed: uses a quad  ADL's / Homemaking Assistance Needed: Husband A with all ADL activity         Hand Dominance   Dominant Hand: Right    Extremity/Trunk Assessment   Upper Extremity Assessment Upper Extremity Assessment: Defer to OT evaluation RUE Deficits / Details: pt s/p CVA- no functional use    Lower Extremity Assessment Lower Extremity Assessment: RLE deficits/detail RLE Deficits / Details: Pt s/p CVA 2013 with residual deficits, most notably no AROM DF        Communication   Communication: No difficulties  Cognition Arousal/Alertness: Awake/alert Behavior During Therapy: Flat affect Overall Cognitive Status: Within Functional Limits for tasks assessed                                        General Comments  Exercises     Assessment/Plan    PT Assessment Patient needs continued PT services  PT Problem List Decreased strength;Decreased activity tolerance;Decreased balance;Decreased mobility;Obesity       PT Treatment Interventions DME instruction;Gait training;Stair training;Functional mobility training;Therapeutic activities;Therapeutic exercise;Patient/family education;Balance training    PT Goals (Current goals can be found in the Care Plan section)  Acute Rehab PT  Goals Patient Stated Goal: home  PT Goal Formulation: With patient Time For Goal Achievement: 05/26/17 Potential to Achieve Goals: Good    Frequency Min 3X/week   Barriers to discharge        Co-evaluation PT/OT/SLP Co-Evaluation/Treatment: Yes Reason for Co-Treatment: For patient/therapist safety PT goals addressed during session: Mobility/safety with mobility OT goals addressed during session: ADL's and self-care       AM-PAC PT "6 Clicks" Daily Activity  Outcome Measure Difficulty turning over in bed (including adjusting bedclothes, sheets and blankets)?: A Lot Difficulty moving from lying on back to sitting on the side of the bed? : A Lot Difficulty sitting down on and standing up from a chair with arms (e.g., wheelchair, bedside commode, etc,.)?: A Lot Help needed moving to and from a bed to chair (including a wheelchair)?: A Little Help needed walking in hospital room?: A Little Help needed climbing 3-5 steps with a railing? : A Little 6 Click Score: 15    End of Session Equipment Utilized During Treatment: Gait belt Activity Tolerance: Patient tolerated treatment well Patient left: in chair;with call bell/phone within reach;with family/visitor present Nurse Communication: Mobility status PT Visit Diagnosis: Difficulty in walking, not elsewhere classified (R26.2)    Time: 0174-9449 PT Time Calculation (min) (ACUTE ONLY): 31 min   Charges:   PT Evaluation $PT Eval Moderate Complexity: 1 Mod     PT G Codes:        Pg [phone]   Maximos Zayas 05/13/2017, 2:25 PM

## 2017-05-13 NOTE — Evaluation (Signed)
Occupational Therapy Evaluation Patient Details Name: Brandy Paul MRN: [id number] DOB: [birth year] Today's Date: 05/13/2017    History of Present Illness Brandy Paul is a 72 y.o. female with PMH of coronary artery spasm, HLD, CVA 2013 with residual right sided weakness and expressive aphasia, DM, breast cancer, COPD, GERD .  Pt with appendicitis.   Clinical Impression   Pt admitted with  appendicits. Pt currently with functional limitations due to the deficits listed below (see OT Problem List).  Pt will benefit from skilled OT to increase their safety and independence with ADL and functional mobility for ADL to facilitate discharge to venue listed below.      Follow Up Recommendations  No OT follow up;Supervision/Assistance - 24 hour    Equipment Recommendations  None recommended by OT    Recommendations for Other Services       Precautions / Restrictions Precautions Precautions: Fall      Mobility Bed Mobility Overal bed mobility: Needs Assistance Bed Mobility: Supine to Sit     Supine to sit: Mod assist        Transfers Overall transfer level: Needs assistance Equipment used: Quad cane Transfers: Sit to/from [facility] Sit to Stand: Min assist Stand pivot transfers: Min assist       General transfer comment: quad cane. Pts husband A with all ADL activity        ADL either performed or assessed with clinical judgement   ADL Overall ADL's : Needs assistance/impaired Eating/Feeding: Minimal assistance;Sitting   Grooming: Minimal assistance;Sitting   Upper Body Bathing: Moderate assistance;Sitting   Lower Body Bathing: Maximal assistance;Sit to/from stand;Cueing for safety;Cueing for sequencing   Upper Body Dressing : Moderate assistance;Sitting   Lower Body Dressing: Maximal assistance;Sit to/from stand   Toilet Transfer: Moderate assistance;Ambulation;Cueing for safety;Cueing for sequencing Toilet Transfer Details (indicate  cue type and reason): quad cane Toileting- Clothing Manipulation and Hygiene: Moderate assistance;Sit to/from stand;Cueing for safety;Cueing for sequencing         General ADL Comments: Husband A with all ADL activity .       Vision Patient Visual Report: No change from baseline              Pertinent Vitals/Pain Pain Assessment: No/denies pain     Hand Dominance     Extremity/Trunk Assessment Upper Extremity Assessment Upper Extremity Assessment: RUE deficits/detail RUE Deficits / Details: pt s/p CVA- no functional use           Communication Communication Communication: No difficulties   Cognition Arousal/Alertness: Awake/alert Behavior During Therapy: Flat affect Overall Cognitive Status: Within Functional Limits for tasks assessed                                                Home Living Family/patient expects to be discharged to:: Private residence Living Arrangements: Spouse/significant other Available Help at Discharge: Family Type of Home: House                                  Prior Functioning/Environment Level of Independence: Needs assistance  Gait / Transfers Assistance Needed: uses a quad ADL's / Homemaking Assistance Needed: Husband A with all ADL activity             OT Problem List: Decreased strength;Decreased coordination;Decreased safety  awareness;Impaired balance (sitting and/or standing);Decreased range of motion;Decreased activity tolerance;Impaired UE functional use      OT Treatment/Interventions: Self-care/ADL training;Patient/family education;DME and/or AE instruction    OT Goals(Current goals can be found in the care plan section) Acute Rehab OT Goals Patient Stated Goal: home  OT Goal Formulation: With patient Time For Goal Achievement: 05/13/17 Potential to Achieve Goals: Good  OT Frequency: Min 2X/week   Barriers to D/C:            Co-evaluation PT/OT/SLP Co-Evaluation/Treatment:  Yes Reason for Co-Treatment: For patient/therapist safety   OT goals addressed during session: ADL's and self-care      AM-PAC PT "6 Clicks" Daily Activity     Outcome Measure Help from another person eating meals?: A Little Help from another person taking care of personal grooming?: A Little Help from another person toileting, which includes using toliet, bedpan, or urinal?: A Lot Help from another person bathing (including washing, rinsing, drying)?: A Lot Help from another person to put on and taking off regular upper body clothing?: A Lot Help from another person to put on and taking off regular lower body clothing?: A Lot 6 Click Score: 14   End of Session Nurse Communication: Mobility status  Activity Tolerance: Patient tolerated treatment well Patient left: with family/visitor present(on BSC)  OT Visit Diagnosis: Unsteadiness on feet (R26.81);Hemiplegia and hemiparesis                Time: 0263-7858 OT Time Calculation (min): 27 min Charges:  OT General Charges $OT Visit: 1 Visit OT Evaluation $OT Eval Moderate Complexity: 1 Mod G-Codes:     Kari Baars, [place]  Payton Mccallum D 05/13/2017, 1:48 PM

## 2017-05-14 LAB — CBC
HEMATOCRIT: 34.9 % — AB (ref 36.0–46.0)
Hemoglobin: 10.7 g/dL — ABNORMAL LOW (ref 12.0–15.0)
MCH: 24.2 pg — ABNORMAL LOW (ref 26.0–34.0)
MCHC: 30.7 g/dL (ref 30.0–36.0)
MCV: 79 fL (ref 78.0–100.0)
Platelets: 153 10*3/uL (ref 150–400)
RBC: 4.42 MIL/uL (ref 3.87–5.11)
RDW: 23.1 % — ABNORMAL HIGH (ref 11.5–15.5)
WBC: 5.4 10*3/uL (ref 4.0–10.5)

## 2017-05-14 MED ORDER — ACETAMINOPHEN 325 MG PO TABS: 650.0000 mg | ORAL_TABLET | Freq: Four times a day (QID) | ORAL | Status: DC | PRN

## 2017-05-14 MED ORDER — AMOXICILLIN-POT CLAVULANATE 875-125 MG PO TABS
1.0000 | ORAL_TABLET | Freq: Two times a day (BID) | ORAL | Status: DC
  Administered 2017-05-14 – 2017-05-15 (×3): 1 via ORAL
  Filled 2017-05-14 (×3): qty 1

## 2017-05-14 NOTE — Care Management Important Message (Signed)
Important Message  Patient Details  Name: TRINIDY MASTERSON MRN: [id number] Date of Birth: [birth year]   Medicare Important Message Given:  Yes    [place], [place] 05/14/2017, 10:52 AM

## 2017-05-14 NOTE — Progress Notes (Signed)
Occupational Therapy Treatment Patient Details Name: Brandy Paul MRN: [id number] DOB: 02/14/1946 Today's Date: 05/14/2017    History of present illness Brandy Paul is a 72 y.o. female with PMH of coronary artery spasm, HLD, CVA 2013 with residual right sided weakness and expressive aphasia, DM, breast cancer, COPD, GERD .  Pt with appendicitis.   OT comments    Follow Up Recommendations  No OT follow up;Supervision/Assistance - 24 hour    Equipment Recommendations  None recommended by OT       Precautions / Restrictions Precautions Precautions: Fall Restrictions Weight Bearing Restrictions: No       Mobility Bed Mobility Overal bed mobility: Needs Assistance Bed Mobility: Supine to Sit     Supine to sit: Min guard     General bed mobility comments: Increased time with HOB 30 degrees  Transfers Overall transfer level: Needs assistance Equipment used: Quad cane Transfers: Sit to/from [facility] Sit to Stand: Min assist;Min guard Stand pivot transfers: Min assist;Min guard       General transfer comment: quad cane. Pts husband A with all ADL activity    Balance Overall balance assessment: Needs assistance Sitting-balance support: No upper extremity supported;Feet supported Sitting balance-Leahy Scale: Good     Standing balance support: Single extremity supported Standing balance-Leahy Scale: Fair                             ADL either performed or assessed with clinical judgement   ADL Overall ADL's : Needs assistance/impaired Eating/Feeding: Minimal assistance;Sitting   Grooming: Minimal assistance;Sitting   Upper Body Bathing: Moderate assistance;Sitting   Lower Body Bathing: Sit to/from stand;Cueing for safety;Cueing for sequencing;Moderate assistance   Upper Body Dressing : Sitting;Minimal assistance   Lower Body Dressing: Sit to/from stand;Moderate assistance   Toilet Transfer: Ambulation;Cueing for  safety;Cueing for sequencing;Minimal assistance;BSC   Toileting- Clothing Manipulation and Hygiene: Sit to/from stand;Cueing for safety;Cueing for sequencing;Minimal assistance         General ADL Comments: Husband A with all ADL activity s/p CVA in 2013.  Pts husband brought up walker and pt able to walk with it in the hall with min guard A               Cognition Arousal/Alertness: Awake/alert Behavior During Therapy: Flat affect;WFL for tasks assessed/performed Overall Cognitive Status: Within Functional Limits for tasks assessed                                           Prior Functioning/Environment              Frequency  Min 2X/week        Progress Toward Goals  OT Goals(current goals can now be found in the care plan section)  Progress towards OT goals: Progressing toward goals     Plan Discharge plan remains appropriate    Co-evaluation                 AM-PAC PT "6 Clicks" Daily Activity     Outcome Measure   Help from another person eating meals?: A Little Help from another person taking care of personal grooming?: A Little Help from another person toileting, which includes using toliet, bedpan, or urinal?: A Little Help from another person bathing (including washing, rinsing, drying)?: A Lot Help from another person to  put on and taking off regular upper body clothing?: A Little Help from another person to put on and taking off regular lower body clothing?: A Lot 6 Click Score: 16    End of Session Equipment Utilized During Treatment: Other (comment)(Up walker)  OT Visit Diagnosis: Unsteadiness on feet (R26.81);Hemiplegia and hemiparesis Hemiplegia - Right/Left: Right   Activity Tolerance Patient tolerated treatment well   Patient Left with family/visitor present(on BSC)   Nurse Communication Mobility status        Time: 0939-1000 OT Time Calculation (min): 21 min  Charges: OT General Charges $OT Visit: 1  Visit OT Treatments $Self Care/Home Management : 8-22 mins  [place], [place]   Payton Mccallum D 05/14/2017, 11:38 AM

## 2017-05-14 NOTE — Progress Notes (Addendum)
[facility] Progress Note  4 Days Post-Op  Subjective: CC: acute appendicitis Patient without abdominal pain. Denies nausea or vomiting. Tolerating diet, having bowel function. VSS.   Objective: Vital signs in last 24 hours: Temp:  [97.5 F (36.4 C)-98.3 F (36.8 C)] 98.3 F (36.8 C) (02/25 0514) Pulse Rate:  [63-71] 71 (02/25 0514) Resp:  [15-18] 18 (02/25 0514) BP: (121-151)/(51-70) 143/68 (02/25 0514) SpO2:  [95 %-97 %] 96 % (02/25 0514) Last BM Date: 05/10/17  Intake/Output from previous day: 02/24 0701 - 02/25 0700 In: 1178 [P.O.:720; I.V.:408; IV Piggyback:50] Out: -  Intake/Output this shift: No intake/output data recorded.  PE: Gen:  Resting, NAD, pleasant Card:  Regular rate and rhythm Pulm:  Normal effort, clear to auscultation bilaterally Abd: Soft, non-tender, non-distended, bowel sounds present, no HSM Skin: warm and dry, no rashes  Psych: A&Ox3   Lab Results:  Recent Labs    05/13/17 0547 05/14/17 0557  WBC 5.4 5.4  HGB 10.6* 10.7*  HCT 35.4* 34.9*  PLT 149* 153   BMET Recent Labs    05/13/17 0547  NA 140  K 4.2  CL 104  CO2 25  GLUCOSE 104*  BUN 15  CREATININE 0.94  CALCIUM 8.5*   PT/INR No results for input(s): LABPROT, INR in the last 72 hours. CMP     Component Value Date/Time   NA 140 05/13/2017 0547   K 4.2 05/13/2017 0547   CL 104 05/13/2017 0547   CO2 25 05/13/2017 0547   GLUCOSE 104 (H) 05/13/2017 0547   BUN 15 05/13/2017 0547   CREATININE 0.94 05/13/2017 0547   CALCIUM 8.5 (L) 05/13/2017 0547   PROT 7.2 05/10/2017 1730   ALBUMIN 3.7 05/10/2017 1730   AST 23 05/10/2017 1730   ALT 17 05/10/2017 1730   ALKPHOS 99 05/10/2017 1730   BILITOT 0.5 05/10/2017 1730   GFRNONAA 59 (L) 05/13/2017 0547   GFRAA >60 05/13/2017 0547   Lipase     Component Value Date/Time   LIPASE 37 05/10/2017 1730       Studies/Results: No results found.  Anti-infectives: Anti-infectives (From admission, onward)   Start     Dose/Rate Route Frequency Ordered Stop   05/10/17 2215  piperacillin-tazobactam (ZOSYN) IVPB 3.375 g     3.375 g 12.5 mL/hr over 240 Minutes Intravenous Every 8 hours 05/10/17 2210     05/10/17 1815  piperacillin-tazobactam (ZOSYN) IVPB 3.375 g     3.375 g 100 mL/hr over 30 Minutes Intravenous  Once 05/10/17 1801 05/10/17 1924       Assessment/Plan Hx of CVA with R paralysis Coronary artery spasm COPD Hx of left breast cancer GERD Pre-diabetic - glucose 104 yesterday, stable Depression   Acute appendicitis - seen on CT 2/21 - medical management, due to significant cardiac hx - IV zosyn started 2/21 - will transition to PO augmentin today  - WBC 5.4, afebrile, not tachycardic - abdomen non-tender and patient having bowel function   QVZ:DGLO diet, saline lock IV VTE:SQ heparin ID: IV zosyn 2/21>2/25; PO augmentin 2/25>>  Dispo: transition to PO abx and likely d/c home tomorrow AM   LOS: 4 days    [name] , [facility] 05/14/2017, 8:23 AM Pager: [phone] Consults: [phone]  Agree with above. Looks good.  Looks like she should get by with non surgical management of appendicitis.  Alphonsa Overall, MD, [facility] Pager: [phone] Office phone:  [phone]

## 2017-05-14 NOTE — Discharge Instructions (Signed)
Appendicitis °The appendix is a tube that is shaped like a finger. It is connected to the large intestine. Appendicitis means that this tube is swollen (inflamed). Without treatment, the tube can tear (rupture). This can lead to a life-threatening infection. It can also cause you to have sores (abscesses). These sores hurt. °What are the causes? °This condition may be caused by something that blocks the appendix, such as: °· A ball of poop (stool). °· Lymph glands that are bigger than normal. ° °Sometimes, the cause is not known. °What are the signs or symptoms? °Symptoms of this condition include: °· Pain around the belly button (navel). °? The pain moves toward the lower right belly (abdomen). °? The pain can get worse with time. °? The pain can get worse if you cough. °? The pain can get worse if you move suddenly. °· Tenderness in the lower right belly. °· Feeling sick to your stomach (nauseous). °· Throwing up (vomiting). °· Not feeling hungry (loss of appetite). °· A fever. °· Having a hard time pooping (constipation). °· Watery poop (diarrhea). °· Not feeling well. ° °How is this treated? °Usually, this condition is treated by taking out the appendix (appendectomy). There are two ways that the appendix can be taken out: °· Open surgery. In this surgery, the appendix is taken out through a large cut (incision). The cut is made in the lower right belly. This surgery may be picked if: °? You have scars from another surgery. °? You have a bleeding condition. °? You are pregnant and will be having your baby soon. °? You have a condition that does not allow the other type of surgery. °· Laparoscopic surgery. In this surgery, the appendix is taken out through small cuts. Often, this surgery: °? Causes less pain. °? Causes fewer problems. °? Is easier to heal from. ° °If your appendix tears and a sore forms: °· A drain may be put into the sore. The drain will be used to get rid of fluid. °· You may get an antibiotic  medicine through an IV tube. °· Your appendix may or may not need to be taken out. ° °This information is not intended to replace advice given to you by your health care provider. Make sure you discuss any questions you have with your health care provider. °Document Released: 05/29/2011 Document Revised: 08/12/2015 Document Reviewed: 07/22/2014 °Elsevier Interactive Patient Education © 2018 [employer] ° °

## 2017-05-15 LAB — CBC
HCT: 39.1 % (ref 36.0–46.0)
Hemoglobin: 11.8 g/dL — ABNORMAL LOW (ref 12.0–15.0)
MCH: 23.9 pg — AB (ref 26.0–34.0)
MCHC: 30.2 g/dL (ref 30.0–36.0)
MCV: 79.3 fL (ref 78.0–100.0)
PLATELETS: 177 10*3/uL (ref 150–400)
RBC: 4.93 MIL/uL (ref 3.87–5.11)
RDW: 23.3 % — AB (ref 11.5–15.5)
WBC: 5.8 10*3/uL (ref 4.0–10.5)

## 2017-05-15 MED ORDER — AMOXICILLIN-POT CLAVULANATE 875-125 MG PO TABS: 1.0000 | ORAL_TABLET | Freq: Two times a day (BID) | ORAL | 0 refills | Status: AC

## 2017-05-15 NOTE — Discharge Summary (Addendum)
[facility] Discharge Summary   Patient ID: Brandy Paul MRN: [id number] DOB/AGE: 73-Nov-1947 73 y.o.  Admit date: 05/10/2017 Discharge date: 05/15/2017  Admitting Diagnosis: Acute appendicitis  Discharge Diagnosis Acute appendicitis  Consultants Cardiology  Imaging: No results found.  Procedures None  Hospital Course:  Patient is a 72 year old female who presented to [facility] with abdominal pain .  Workup showed probable acute appendicitis.  Cardiology consulted and assessed patient to be a moderate surgical risk. Patient started on a trial of medical management for appendicitis given cardiac risk and showed improvement with this. On 05/15/17 the patient was voiding well, tolerating diet, ambulating well, pain well controlled, vital signs stable and felt stable for discharge home.  Patient will follow up in our office in 4-6 weeks and knows to call with questions or concerns.  She will call to confirm appointment date/time.    Physical Exam: Gen:  Resting, NAD, pleasant Card:  Regular rate and rhythm Pulm:  Normal effort, clear to auscultation bilaterally Abd: Soft, non-tender, non-distended, bowel sounds present, no HSM Skin: warm and dry, no rashes   Allergies as of 05/15/2017      Reactions   Ceclor [cefaclor] Other (See Comments)   REACTION: "SEVERE HEADACHE"      Medication List    TAKE these medications   acetaminophen 500 MG tablet Commonly known as:  TYLENOL Take 500 mg by mouth daily as needed.   amoxicillin-clavulanate 875-125 MG tablet Commonly known as:  AUGMENTIN Take 1 tablet by mouth every 12 (twelve) hours for 10 days.   aspirin 81 MG tablet Take 81 mg by mouth every evening.   atorvastatin 20 MG tablet Commonly known as:  LIPITOR Take 20 mg by mouth at bedtime.   clopidogrel 75 MG tablet Commonly known as:  PLAVIX Take 1 tablet (75 mg total) by mouth at bedtime.   ezetimibe 10 MG tablet Commonly known as:  ZETIA Take 10 mg by  mouth at bedtime.   isosorbide mononitrate 60 MG 24 hr tablet Commonly known as:  IMDUR TAKE 1 TABLET BY MOUTH DAILY   nitroGLYCERIN 0.4 MG SL tablet Commonly known as:  NITROSTAT Place 0.4 mg under the tongue every 5 (five) minutes as needed for chest pain (Call 911 at 3rd dose within 15 minutes).   OSCAL 500/200 D-3 PO Take 1 tablet by mouth every evening.   RABEprazole 20 MG tablet Commonly known as:  ACIPHEX Take 20 mg by mouth at bedtime.   sertraline 25 MG tablet Commonly known as:  ZOLOFT Take 50 mg by mouth at bedtime.   SPIRIVA HANDIHALER 18 MCG inhalation capsule Generic drug:  tiotropium Place 18 mcg into inhaler and inhale daily.        Follow-up Information    Velna Hatchet, MD. Call.   Specialty:  Internal Medicine Why:  Call and schedule an appointment with your primary provider in 2-3 weeks for follow up Contact information: [place] [address] [phone]        Belva Crome, MD Follow up.   Specialty:  Cardiology Contact information: [address] [place] [address] [phone]        Leighton Ruff, MD Follow up.   Specialty:  General Surgery Why:  call for an appointment in 4-6 weeks. Contact information: [place] [address] [phone]           Signed: [name], [facility] Surgery 05/15/2017, 10:38  AM Pager: [phone] Consults: [phone]  Agree with above. She seems to have done well with antibiotics alone for now.  Alphonsa Overall, MD, [facility] Surgery Pager: [phone] Office phone:  [phone]

## 2017-06-13 DIAGNOSIS — K3580 Unspecified acute appendicitis: Secondary | ICD-10-CM | POA: Diagnosis not present

## 2017-06-13 DIAGNOSIS — Z6837 Body mass index (BMI) 37.0-37.9, adult: Secondary | ICD-10-CM | POA: Diagnosis not present

## 2017-06-13 DIAGNOSIS — I69961 Other paralytic syndrome following unspecified cerebrovascular disease affecting right dominant side: Secondary | ICD-10-CM | POA: Diagnosis not present

## 2017-06-13 DIAGNOSIS — E119 Type 2 diabetes mellitus without complications: Secondary | ICD-10-CM | POA: Diagnosis not present

## 2017-06-13 DIAGNOSIS — F339 Major depressive disorder, recurrent, unspecified: Secondary | ICD-10-CM | POA: Diagnosis not present

## 2017-06-13 DIAGNOSIS — J449 Chronic obstructive pulmonary disease, unspecified: Secondary | ICD-10-CM | POA: Diagnosis not present

## 2017-06-13 DIAGNOSIS — K219 Gastro-esophageal reflux disease without esophagitis: Secondary | ICD-10-CM | POA: Diagnosis not present

## 2017-06-13 DIAGNOSIS — E7849 Other hyperlipidemia: Secondary | ICD-10-CM | POA: Diagnosis not present

## 2017-06-29 DIAGNOSIS — L821 Other seborrheic keratosis: Secondary | ICD-10-CM | POA: Diagnosis not present

## 2017-06-29 DIAGNOSIS — Z789 Other specified health status: Secondary | ICD-10-CM | POA: Diagnosis not present

## 2017-06-29 DIAGNOSIS — F339 Major depressive disorder, recurrent, unspecified: Secondary | ICD-10-CM | POA: Diagnosis not present

## 2017-06-29 DIAGNOSIS — I69961 Other paralytic syndrome following unspecified cerebrovascular disease affecting right dominant side: Secondary | ICD-10-CM | POA: Diagnosis not present

## 2017-06-29 DIAGNOSIS — E7849 Other hyperlipidemia: Secondary | ICD-10-CM | POA: Diagnosis not present

## 2017-06-29 DIAGNOSIS — J449 Chronic obstructive pulmonary disease, unspecified: Secondary | ICD-10-CM | POA: Diagnosis not present

## 2017-07-11 ENCOUNTER — Encounter (HOSPITAL_COMMUNITY): Payer: Self-pay | Admitting: Emergency Medicine

## 2017-07-11 ENCOUNTER — Other Ambulatory Visit: Payer: Self-pay

## 2017-07-11 ENCOUNTER — Inpatient Hospital Stay (HOSPITAL_COMMUNITY)
Admission: EM | Admit: 2017-07-11 | Discharge: 2017-07-16 | DRG: 445 | Disposition: A | Discharge status: Home Health | Payer: Medicare Other | Attending: Internal Medicine | Admitting: Internal Medicine

## 2017-07-11 DIAGNOSIS — Z7902 Long term (current) use of antithrombotics/antiplatelets: Secondary | ICD-10-CM

## 2017-07-11 DIAGNOSIS — Z8249 Family history of ischemic heart disease and other diseases of the circulatory system: Secondary | ICD-10-CM

## 2017-07-11 DIAGNOSIS — I10 Essential (primary) hypertension: Secondary | ICD-10-CM | POA: Diagnosis not present

## 2017-07-11 DIAGNOSIS — IMO0002 Reserved for concepts with insufficient information to code with codable children: Secondary | ICD-10-CM | POA: Diagnosis present

## 2017-07-11 DIAGNOSIS — Z823 Family history of stroke: Secondary | ICD-10-CM

## 2017-07-11 DIAGNOSIS — J449 Chronic obstructive pulmonary disease, unspecified: Secondary | ICD-10-CM | POA: Diagnosis present

## 2017-07-11 DIAGNOSIS — E1165 Type 2 diabetes mellitus with hyperglycemia: Secondary | ICD-10-CM | POA: Diagnosis present

## 2017-07-11 DIAGNOSIS — K810 Acute cholecystitis: Secondary | ICD-10-CM | POA: Diagnosis not present

## 2017-07-11 DIAGNOSIS — I69320 Aphasia following cerebral infarction: Secondary | ICD-10-CM

## 2017-07-11 DIAGNOSIS — F329 Major depressive disorder, single episode, unspecified: Secondary | ICD-10-CM | POA: Diagnosis present

## 2017-07-11 DIAGNOSIS — I2510 Atherosclerotic heart disease of native coronary artery without angina pectoris: Secondary | ICD-10-CM | POA: Diagnosis not present

## 2017-07-11 DIAGNOSIS — Z7982 Long term (current) use of aspirin: Secondary | ICD-10-CM

## 2017-07-11 DIAGNOSIS — N179 Acute kidney failure, unspecified: Secondary | ICD-10-CM | POA: Diagnosis present

## 2017-07-11 DIAGNOSIS — Z8261 Family history of arthritis: Secondary | ICD-10-CM

## 2017-07-11 DIAGNOSIS — I69351 Hemiplegia and hemiparesis following cerebral infarction affecting right dominant side: Secondary | ICD-10-CM

## 2017-07-11 DIAGNOSIS — Z853 Personal history of malignant neoplasm of breast: Secondary | ICD-10-CM

## 2017-07-11 DIAGNOSIS — K8001 Calculus of gallbladder with acute cholecystitis with obstruction: Principal | ICD-10-CM | POA: Diagnosis present

## 2017-07-11 DIAGNOSIS — R1031 Right lower quadrant pain: Secondary | ICD-10-CM

## 2017-07-11 DIAGNOSIS — E118 Type 2 diabetes mellitus with unspecified complications: Secondary | ICD-10-CM

## 2017-07-11 DIAGNOSIS — Z87891 Personal history of nicotine dependence: Secondary | ICD-10-CM

## 2017-07-11 DIAGNOSIS — K219 Gastro-esophageal reflux disease without esophagitis: Secondary | ICD-10-CM | POA: Diagnosis present

## 2017-07-11 DIAGNOSIS — E785 Hyperlipidemia, unspecified: Secondary | ICD-10-CM | POA: Diagnosis present

## 2017-07-11 DIAGNOSIS — M810 Age-related osteoporosis without current pathological fracture: Secondary | ICD-10-CM | POA: Diagnosis present

## 2017-07-11 DIAGNOSIS — R1011 Right upper quadrant pain: Secondary | ICD-10-CM

## 2017-07-11 DIAGNOSIS — Z9012 Acquired absence of left breast and nipple: Secondary | ICD-10-CM

## 2017-07-11 DIAGNOSIS — R109 Unspecified abdominal pain: Secondary | ICD-10-CM | POA: Diagnosis present

## 2017-07-11 DIAGNOSIS — E119 Type 2 diabetes mellitus without complications: Secondary | ICD-10-CM | POA: Diagnosis present

## 2017-07-11 DIAGNOSIS — Z800 Family history of malignant neoplasm of digestive organs: Secondary | ICD-10-CM

## 2017-07-11 DIAGNOSIS — Z833 Family history of diabetes mellitus: Secondary | ICD-10-CM

## 2017-07-11 DIAGNOSIS — K449 Diaphragmatic hernia without obstruction or gangrene: Secondary | ICD-10-CM | POA: Diagnosis not present

## 2017-07-11 DIAGNOSIS — K819 Cholecystitis, unspecified: Secondary | ICD-10-CM

## 2017-07-11 LAB — URINALYSIS, ROUTINE W REFLEX MICROSCOPIC
BACTERIA UA: NONE SEEN
Bilirubin Urine: NEGATIVE
Glucose, UA: NEGATIVE mg/dL
Hgb urine dipstick: NEGATIVE
KETONES UR: NEGATIVE mg/dL
Leukocytes, UA: NEGATIVE
Nitrite: NEGATIVE
PROTEIN: 30 mg/dL — AB
Specific Gravity, Urine: 1.025 (ref 1.005–1.030)
pH: 5 (ref 5.0–8.0)

## 2017-07-11 LAB — CBC
HEMATOCRIT: 42.4 % (ref 36.0–46.0)
HEMOGLOBIN: 13.3 g/dL (ref 12.0–15.0)
MCH: 25.3 pg — ABNORMAL LOW (ref 26.0–34.0)
MCHC: 31.4 g/dL (ref 30.0–36.0)
MCV: 80.8 fL (ref 78.0–100.0)
Platelets: 179 10*3/uL (ref 150–400)
RBC: 5.25 MIL/uL — ABNORMAL HIGH (ref 3.87–5.11)
RDW: 18.9 % — AB (ref 11.5–15.5)
WBC: 9.2 10*3/uL (ref 4.0–10.5)

## 2017-07-11 MED ORDER — SODIUM CHLORIDE 0.9 % IV BOLUS
500.0000 mL | Freq: Once | INTRAVENOUS | Status: AC
  Administered 2017-07-11: 500 mL via INTRAVENOUS

## 2017-07-11 NOTE — ED Notes (Signed)
Lavender tube clotted per main lab.

## 2017-07-11 NOTE — ED Provider Notes (Signed)
[facility] DEPT Provider Note  CSN: [id number] Arrival date & time: 07/11/17 1418  Chief Complaint(s) Abdominal Pain  HPI SARAHANN HORRELL is a 72 y.o. female   The history is provided by the patient.  Abdominal Pain   This is a recurrent problem. The current episode started 2 days ago. The problem occurs constantly. The problem has been gradually worsening. The pain is associated with an unknown factor. The pain is located in the RLQ. The quality of the pain is aching. The pain is moderate. Pertinent negatives include fever, diarrhea, melena, nausea, vomiting, constipation and dysuria. The symptoms are aggravated by palpation and certain positions. The symptoms are relieved by being still. Past medical history comments: Patient was diagnosed with appendicitis 2 months ago that was treated conservatively with antibiotics which did improve her symptoms. .    Past Medical History Past Medical History:  Diagnosis Date  . Breast cancer ([place]) 2005   left  . COPD (chronic obstructive pulmonary disease) (HCC)    hx of tobacco use  . Coronary artery spasm ([place])   . Depression   . GERD (gastroesophageal reflux disease)   . Hyperlipemia   . IDA (iron deficiency anemia)    last iron infusion 4 months ago  . Osteoporosis   . 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 Prediabetes    hx of  . Rash    under right breast and groin line using nizoral ointment to q day per dermatology saw few weeks ago  . Stress incontinence wers depends  . Stroke [facility]) 2013   paralytic syndrome of dominant right side  . Toe fracture, left    Patient Active Problem List   Diagnosis Date Noted  . Appendicitis 05/10/2017  . DOE (dyspnea on exertion) 04/22/2017  . Anemia 12/02/2015  . Ovarian mass, left 11/04/2015  . Pelvic mass in female 11/04/2015  . Iron deficiency anemia 04/28/2015  . Nonspecific abnormal finding in stool contents 04/06/2015  . Anemia, iron deficiency  04/06/2015  . Encounter for long-term (current) use of antiplatelets/antithrombotics 04/06/2015  . Hyperlipidemia 11/24/2014  . Hemiparesis affecting right side as late effect of cerebrovascular accident ([place]) 11/24/2014  . Coronary artery spasm ([place]) 10/09/2013  . 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 Gastroesophageal reflux disease without esophagitis 10/09/2013  . Centrilobular emphysema ([place]) 10/09/2013   Home Medication(s) Prior to Admission medications   Medication Sig Start Date End Date Taking? Authorizing Provider  acetaminophen (TYLENOL) 500 MG tablet Take 500 mg by mouth daily as needed for mild pain.    Yes [provider]  aspirin 81 MG tablet Take 81 mg by mouth every evening.    Yes [provider]  atorvastatin (LIPITOR) 20 MG tablet Take 20 mg by mouth at bedtime.  10/31/14  Yes [provider]  buPROPion (WELLBUTRIN SR) 150 MG 12 hr tablet Take 1 tablet by mouth daily. 05/18/17  Yes [provider]  Calcium Carbonate-Vitamin D (OSCAL 500/200 D-3 PO) Take 1 tablet by mouth every evening.    Yes [provider]  clopidogrel (PLAVIX) 75 MG tablet Take 1 tablet (75 mg total) by mouth at bedtime. 11/07/15  Yes Cross, Melissa D, NP  ezetimibe (ZETIA) 10 MG tablet Take 10 mg by mouth at bedtime.    Yes [provider]  isosorbide mononitrate (IMDUR) 60 MG 24 hr tablet TAKE 1 TABLET BY MOUTH DAILY 04/25/17  Yes Belva Crome, MD  nitroGLYCERIN (NITROSTAT) 0.4 MG SL tablet Place 0.4 mg under the tongue every 5 (five) minutes as needed for chest pain (Call 911 at 3rd dose within 15 minutes).   Yes [provider]  RABEprazole (ACIPHEX) 20 MG tablet Take 20 mg by mouth at bedtime.  10/29/14  Yes [provider]  sertraline (ZOLOFT) 25 MG tablet Take 50 mg by mouth at bedtime.  11/17/14  Yes [provider]  tiotropium (SPIRIVA  HANDIHALER) 18 MCG inhalation capsule Place 18 mcg into inhaler and inhale daily.    Yes [provider]                                                                                                                                    Past Surgical History Past Surgical History:  Procedure Laterality Date  . ABDOMINAL HYSTERECTOMY     complete  . COLONOSCOPY WITH PROPOFOL N/A 12/03/2015   Procedure: COLONOSCOPY WITH PROPOFOL;  Surgeon: Mauri Pole, MD;  Location: [facility] ENDOSCOPY;  Service: Endoscopy;  Laterality: N/A;  tba before procdcedure  . MASTECTOMY Left    1 lymph node removed  . RADIAL KERATOTOMY Bilateral   . ROBOTIC ASSISTED BILATERAL SALPINGO OOPHERECTOMY Bilateral 11/04/2015   Procedure: XI ROBOTIC ASSISTED BILATERAL SALPINGO OOPHORECTOMY;  Surgeon: Everitt Amber, MD;  Location: [facility] ORS;  Service: Gynecology;  Laterality: Bilateral;  . TONSILLECTOMY     Family History Family History  Problem Relation Age of Onset  . CVA Mother   . Liver cancer Mother   . Heart attack Sister   . Stroke Maternal Grandfather   . Hypertension Sister   . Diabetes Sister   . Arthritis Sister     Social History Social History   Tobacco Use  . Smoking status: Former Smoker    Types: Cigarettes    Last attempt to quit: 02/17/1994    Years since quitting: 23.4  . Smokeless tobacco: Never Used  Substance Use Topics  . Alcohol use: No    Alcohol/week: 0.0 oz  . Drug use: No   Allergies Ceclor [cefaclor]  Review of Systems Review of Systems  Constitutional: Negative for fever.  Gastrointestinal: Positive for abdominal pain. Negative for constipation, diarrhea, melena, nausea and vomiting.  Genitourinary: Negative for dysuria.   All other systems are reviewed and are negative for acute change except as noted in the HPI  Physical Exam Vital Signs  I have reviewed the triage vital signs BP (!) 118/57 (BP Location: Right Arm)   Pulse 84   Temp 98 F (36.7 C) (Oral)   Resp  17   SpO2 96%   Physical Exam  Constitutional: She is oriented to person, place, and time. She appears well-developed and well-nourished. No distress.  HENT:  Head: Normocephalic and atraumatic.  Right Ear: External ear normal.  Left Ear: External ear normal.  Nose: Nose normal.  Eyes: Conjunctivae and  EOM are normal. No scleral icterus.  Neck: Normal range of motion and phonation normal.  Cardiovascular: Normal rate and regular rhythm.  Pulmonary/Chest: Effort normal. No stridor. No respiratory distress.  Abdominal: She exhibits no distension. There is tenderness in the right lower quadrant. There is tenderness at McBurney's point. There is no rigidity, no rebound and no guarding.  Musculoskeletal: Normal range of motion. She exhibits no edema.  Neurological: She is alert and oriented to person, place, and time.  Skin: She is not diaphoretic.  Psychiatric: She has a normal mood and affect. Her behavior is normal.  Vitals reviewed.   ED Results and Treatments Labs (all labs ordered are listed, but only abnormal results are displayed) Labs Reviewed  URINALYSIS, ROUTINE W REFLEX MICROSCOPIC - Abnormal; Notable for the following components:      Result Value   APPearance HAZY (*)    Protein, ur 30 (*)    All other components within normal limits  COMPREHENSIVE METABOLIC PANEL  LIPASE, BLOOD  CBC                                                                                                                         EKG  EKG Interpretation  Date/Time:    Ventricular Rate:    PR Interval:    QRS Duration:   QT Interval:    QTC Calculation:   R Axis:     Text Interpretation:        Radiology No results found. Pertinent labs & imaging results that were available during my care of the patient were reviewed by me and considered in my medical decision making (see chart for details).  Medications Ordered in ED Medications  sodium chloride 0.9 % bolus 500 mL (has no  administration in time range)                                                                                                                                    Procedures Procedures  (including critical care time)  Medical Decision Making / ED Course I have reviewed the nursing notes for this encounter and the patient's prior records (if available in EHR or on provided paperwork).    Patient with a history of recent appendicitis treated conservatively with antibiotics who presents today with right lower quadrant abdominal pain similar to the prior episode of appendicitis.  She does have tenderness at McBurney's point.  Will require labs and CT to assess for possible appendicitis.  UA was obtained and negative.  Patient care turned over to Dr Ward at 2330. Patient case and results discussed in detail; please see their note for further ED managment.      Final Clinical Impression(s) / ED Diagnoses Final diagnoses:  RLQ abdominal pain      This chart was dictated using voice recognition software.  Despite best efforts to proofread,  errors can occur which can change the documentation meaning.   Fatima Blank, MD 07/11/17 [phone]

## 2017-07-11 NOTE — ED Notes (Addendum)
Light green tube need to be recollected due to insufficient amount per main lab.

## 2017-07-11 NOTE — ED Triage Notes (Signed)
Pt complaint of right abdominal pain; tender to touch; denies n/v/d; evaluated for same a few months ago and diagnosed with inflamed appendix; concern for appendicitis.

## 2017-07-12 ENCOUNTER — Emergency Department (HOSPITAL_COMMUNITY): Payer: Medicare Other

## 2017-07-12 ENCOUNTER — Other Ambulatory Visit: Payer: Self-pay

## 2017-07-12 ENCOUNTER — Inpatient Hospital Stay (HOSPITAL_COMMUNITY): Payer: Medicare Other

## 2017-07-12 ENCOUNTER — Encounter (HOSPITAL_COMMUNITY): Payer: Self-pay

## 2017-07-12 DIAGNOSIS — Z9012 Acquired absence of left breast and nipple: Secondary | ICD-10-CM | POA: Diagnosis not present

## 2017-07-12 DIAGNOSIS — E785 Hyperlipidemia, unspecified: Secondary | ICD-10-CM | POA: Diagnosis present

## 2017-07-12 DIAGNOSIS — R109 Unspecified abdominal pain: Secondary | ICD-10-CM | POA: Diagnosis present

## 2017-07-12 DIAGNOSIS — K8000 Calculus of gallbladder with acute cholecystitis without obstruction: Secondary | ICD-10-CM | POA: Diagnosis not present

## 2017-07-12 DIAGNOSIS — F329 Major depressive disorder, single episode, unspecified: Secondary | ICD-10-CM | POA: Diagnosis present

## 2017-07-12 DIAGNOSIS — R1013 Epigastric pain: Secondary | ICD-10-CM | POA: Diagnosis not present

## 2017-07-12 DIAGNOSIS — J449 Chronic obstructive pulmonary disease, unspecified: Secondary | ICD-10-CM | POA: Diagnosis present

## 2017-07-12 DIAGNOSIS — K810 Acute cholecystitis: Secondary | ICD-10-CM

## 2017-07-12 DIAGNOSIS — E118 Type 2 diabetes mellitus with unspecified complications: Secondary | ICD-10-CM | POA: Diagnosis not present

## 2017-07-12 DIAGNOSIS — R1084 Generalized abdominal pain: Secondary | ICD-10-CM

## 2017-07-12 DIAGNOSIS — Z7902 Long term (current) use of antithrombotics/antiplatelets: Secondary | ICD-10-CM | POA: Diagnosis not present

## 2017-07-12 DIAGNOSIS — E1165 Type 2 diabetes mellitus with hyperglycemia: Secondary | ICD-10-CM | POA: Diagnosis not present

## 2017-07-12 DIAGNOSIS — M810 Age-related osteoporosis without current pathological fracture: Secondary | ICD-10-CM | POA: Diagnosis present

## 2017-07-12 DIAGNOSIS — I69320 Aphasia following cerebral infarction: Secondary | ICD-10-CM

## 2017-07-12 DIAGNOSIS — K449 Diaphragmatic hernia without obstruction or gangrene: Secondary | ICD-10-CM | POA: Diagnosis not present

## 2017-07-12 DIAGNOSIS — Z87891 Personal history of nicotine dependence: Secondary | ICD-10-CM | POA: Diagnosis not present

## 2017-07-12 DIAGNOSIS — K819 Cholecystitis, unspecified: Secondary | ICD-10-CM | POA: Diagnosis not present

## 2017-07-12 DIAGNOSIS — Z823 Family history of stroke: Secondary | ICD-10-CM | POA: Diagnosis not present

## 2017-07-12 DIAGNOSIS — Z853 Personal history of malignant neoplasm of breast: Secondary | ICD-10-CM | POA: Diagnosis not present

## 2017-07-12 DIAGNOSIS — R1011 Right upper quadrant pain: Secondary | ICD-10-CM | POA: Diagnosis not present

## 2017-07-12 DIAGNOSIS — I69351 Hemiplegia and hemiparesis following cerebral infarction affecting right dominant side: Secondary | ICD-10-CM | POA: Diagnosis not present

## 2017-07-12 DIAGNOSIS — Z833 Family history of diabetes mellitus: Secondary | ICD-10-CM | POA: Diagnosis not present

## 2017-07-12 DIAGNOSIS — I2510 Atherosclerotic heart disease of native coronary artery without angina pectoris: Secondary | ICD-10-CM | POA: Diagnosis present

## 2017-07-12 DIAGNOSIS — K8001 Calculus of gallbladder with acute cholecystitis with obstruction: Secondary | ICD-10-CM | POA: Diagnosis present

## 2017-07-12 DIAGNOSIS — Z800 Family history of malignant neoplasm of digestive organs: Secondary | ICD-10-CM | POA: Diagnosis not present

## 2017-07-12 DIAGNOSIS — Z7982 Long term (current) use of aspirin: Secondary | ICD-10-CM | POA: Diagnosis not present

## 2017-07-12 DIAGNOSIS — Z8249 Family history of ischemic heart disease and other diseases of the circulatory system: Secondary | ICD-10-CM | POA: Diagnosis not present

## 2017-07-12 DIAGNOSIS — I10 Essential (primary) hypertension: Secondary | ICD-10-CM | POA: Diagnosis present

## 2017-07-12 DIAGNOSIS — K8020 Calculus of gallbladder without cholecystitis without obstruction: Secondary | ICD-10-CM | POA: Diagnosis not present

## 2017-07-12 DIAGNOSIS — N179 Acute kidney failure, unspecified: Secondary | ICD-10-CM | POA: Diagnosis not present

## 2017-07-12 DIAGNOSIS — Z8261 Family history of arthritis: Secondary | ICD-10-CM | POA: Diagnosis not present

## 2017-07-12 DIAGNOSIS — K219 Gastro-esophageal reflux disease without esophagitis: Secondary | ICD-10-CM | POA: Diagnosis present

## 2017-07-12 LAB — CBC
HEMATOCRIT: 37.8 % (ref 36.0–46.0)
Hemoglobin: 12.1 g/dL (ref 12.0–15.0)
MCH: 25.9 pg — AB (ref 26.0–34.0)
MCHC: 32 g/dL (ref 30.0–36.0)
MCV: 80.8 fL (ref 78.0–100.0)
Platelets: 178 10*3/uL (ref 150–400)
RBC: 4.68 MIL/uL (ref 3.87–5.11)
RDW: 19.1 % — AB (ref 11.5–15.5)
WBC: 9.6 10*3/uL (ref 4.0–10.5)

## 2017-07-12 LAB — COMPREHENSIVE METABOLIC PANEL
ALT: 15 U/L (ref 14–54)
AST: 19 U/L (ref 15–41)
Albumin: 3.1 g/dL — ABNORMAL LOW (ref 3.5–5.0)
Alkaline Phosphatase: 93 U/L (ref 38–126)
Anion gap: 11 (ref 5–15)
BUN: 28 mg/dL — AB (ref 6–20)
CHLORIDE: 99 mmol/L — AB (ref 101–111)
CO2: 25 mmol/L (ref 22–32)
Calcium: 9 mg/dL (ref 8.9–10.3)
Creatinine, Ser: 1.25 mg/dL — ABNORMAL HIGH (ref 0.44–1.00)
GFR calc Af Amer: 49 mL/min — ABNORMAL LOW (ref >60)
GFR calc non Af Amer: 42 mL/min — ABNORMAL LOW (ref >60)
Glucose, Bld: 113 mg/dL — ABNORMAL HIGH (ref 65–99)
POTASSIUM: 4.2 mmol/L (ref 3.5–5.1)
SODIUM: 135 mmol/L (ref 135–145)
Total Bilirubin: 0.5 mg/dL (ref 0.3–1.2)
Total Protein: 7.1 g/dL (ref 6.5–8.1)

## 2017-07-12 LAB — CBG MONITORING, ED
GLUCOSE-CAPILLARY: 103 mg/dL — AB (ref 65–99)
GLUCOSE-CAPILLARY: 101 mg/dL — AB (ref 65–99)
Glucose-Capillary: 104 mg/dL — ABNORMAL HIGH (ref 65–99)
Glucose-Capillary: 105 mg/dL — ABNORMAL HIGH (ref 65–99)

## 2017-07-12 LAB — GLUCOSE, CAPILLARY
GLUCOSE-CAPILLARY: 88 mg/dL (ref 65–99)
GLUCOSE-CAPILLARY: 91 mg/dL (ref 65–99)

## 2017-07-12 LAB — PROTIME-INR
INR: 1.04
Prothrombin Time: 13.5 seconds (ref 11.4–15.2)

## 2017-07-12 LAB — BASIC METABOLIC PANEL
Anion gap: 11 (ref 5–15)
BUN: 24 mg/dL — AB (ref 6–20)
CO2: 24 mmol/L (ref 22–32)
Calcium: 8.2 mg/dL — ABNORMAL LOW (ref 8.9–10.3)
Chloride: 99 mmol/L — ABNORMAL LOW (ref 101–111)
Creatinine, Ser: 1.08 mg/dL — ABNORMAL HIGH (ref 0.44–1.00)
GFR calc Af Amer: 58 mL/min — ABNORMAL LOW (ref >60)
GFR calc non Af Amer: 50 mL/min — ABNORMAL LOW (ref >60)
Glucose, Bld: 107 mg/dL — ABNORMAL HIGH (ref 65–99)
POTASSIUM: 4.3 mmol/L (ref 3.5–5.1)
Sodium: 134 mmol/L — ABNORMAL LOW (ref 135–145)

## 2017-07-12 LAB — HEPATIC FUNCTION PANEL
ALT: 16 U/L (ref 14–54)
AST: 27 U/L (ref 15–41)
Albumin: 2.7 g/dL — ABNORMAL LOW (ref 3.5–5.0)
Alkaline Phosphatase: 82 U/L (ref 38–126)
BILIRUBIN DIRECT: 0.4 mg/dL (ref 0.1–0.5)
BILIRUBIN TOTAL: 1.3 mg/dL — AB (ref 0.3–1.2)
Indirect Bilirubin: 0.9 mg/dL (ref 0.3–0.9)
Total Protein: 6.5 g/dL (ref 6.5–8.1)

## 2017-07-12 LAB — LIPASE, BLOOD: LIPASE: 27 U/L (ref 11–51)

## 2017-07-12 MED ORDER — TIOTROPIUM BROMIDE MONOHYDRATE 18 MCG IN CAPS
18.0000 ug | ORAL_CAPSULE | Freq: Every day | RESPIRATORY_TRACT | Status: DC
  Administered 2017-07-13 – 2017-07-16 (×4): 18 ug via RESPIRATORY_TRACT
  Filled 2017-07-12: qty 5

## 2017-07-12 MED ORDER — MORPHINE SULFATE (PF) 4 MG/ML IV SOLN
3.8000 mg | Freq: Once | INTRAVENOUS | Status: AC
  Administered 2017-07-12: 3.8 mg via INTRAVENOUS

## 2017-07-12 MED ORDER — SERTRALINE HCL 50 MG PO TABS
50.0000 mg | ORAL_TABLET | Freq: Every day | ORAL | Status: DC
  Administered 2017-07-12 – 2017-07-15 (×4): 50 mg via ORAL
  Filled 2017-07-12 (×4): qty 1

## 2017-07-12 MED ORDER — PIPERACILLIN-TAZOBACTAM 3.375 G IVPB
3.3750 g | Freq: Three times a day (TID) | INTRAVENOUS | Status: DC
  Administered 2017-07-12 – 2017-07-16 (×12): 3.375 g via INTRAVENOUS
  Filled 2017-07-12 (×11): qty 50

## 2017-07-12 MED ORDER — PIPERACILLIN-TAZOBACTAM 3.375 G IVPB 30 MIN
3.3750 g | Freq: Once | INTRAVENOUS | Status: AC
  Administered 2017-07-12: 3.375 g via INTRAVENOUS
  Filled 2017-07-12: qty 50

## 2017-07-12 MED ORDER — ACETAMINOPHEN 650 MG RE SUPP: 650.0000 mg | Freq: Four times a day (QID) | RECTAL | Status: DC | PRN

## 2017-07-12 MED ORDER — MORPHINE SULFATE (PF) 4 MG/ML IV SOLN
INTRAVENOUS | Status: AC
  Filled 2017-07-12: qty 1

## 2017-07-12 MED ORDER — NITROGLYCERIN 0.4 MG SL SUBL: 0.4000 mg | SUBLINGUAL_TABLET | SUBLINGUAL | Status: DC | PRN

## 2017-07-12 MED ORDER — IOPAMIDOL (ISOVUE-300) INJECTION 61%
INTRAVENOUS | Status: AC
  Filled 2017-07-12: qty 100

## 2017-07-12 MED ORDER — SODIUM CHLORIDE 0.9 % IV SOLN
INTRAVENOUS | Status: AC
  Administered 2017-07-12 (×2): via INTRAVENOUS

## 2017-07-12 MED ORDER — INSULIN ASPART 100 UNIT/ML ~~LOC~~ SOLN
0.0000 [IU] | SUBCUTANEOUS | Status: DC
  Administered 2017-07-13: 1 [IU] via SUBCUTANEOUS

## 2017-07-12 MED ORDER — MORPHINE SULFATE (PF) 4 MG/ML IV SOLN
2.0000 mg | INTRAVENOUS | Status: DC | PRN
  Administered 2017-07-13 – 2017-07-14 (×6): 2 mg via INTRAVENOUS
  Filled 2017-07-12 (×6): qty 1

## 2017-07-12 MED ORDER — MORPHINE SULFATE (PF) 2 MG/ML IV SOLN: 2.0000 mg | INTRAVENOUS | Status: DC | PRN

## 2017-07-12 MED ORDER — ACETAMINOPHEN 325 MG PO TABS: 650.0000 mg | ORAL_TABLET | Freq: Four times a day (QID) | ORAL | Status: DC | PRN

## 2017-07-12 MED ORDER — BUPROPION HCL ER (SR) 150 MG PO TB12
150.0000 mg | ORAL_TABLET | Freq: Two times a day (BID) | ORAL | Status: DC
  Administered 2017-07-12 – 2017-07-16 (×7): 150 mg via ORAL
  Filled 2017-07-12 (×7): qty 1

## 2017-07-12 MED ORDER — IOPAMIDOL (ISOVUE-300) INJECTION 61%
100.0000 mL | Freq: Once | INTRAVENOUS | Status: AC | PRN
  Administered 2017-07-12: 80 mL via INTRAVENOUS

## 2017-07-12 MED ORDER — ONDANSETRON HCL 4 MG PO TABS
4.0000 mg | ORAL_TABLET | Freq: Four times a day (QID) | ORAL | Status: DC | PRN
Start: 2017-07-12 — End: 2017-07-16

## 2017-07-12 MED ORDER — MORPHINE SULFATE (PF) 4 MG/ML IV SOLN
INTRAVENOUS | Status: AC
  Administered 2017-07-12: 3.8 mg via INTRAVENOUS
  Filled 2017-07-12: qty 1

## 2017-07-12 MED ORDER — ONDANSETRON HCL 4 MG/2ML IJ SOLN: 4.0000 mg | Freq: Four times a day (QID) | INTRAMUSCULAR | Status: DC | PRN

## 2017-07-12 NOTE — ED Notes (Signed)
ED TO INPATIENT HANDOFF REPORT  Name/Age/Gender Brandy Paul 72 y.o. female  Code Status Code Status History    Date Active Date Inactive Code Status Order ID Comments User Context   05/10/2017 2210 05/15/2017 1450 Full Code [id number]  Jovita Kussmaul, MD ED   12/01/2015 1153 12/03/2015 1834 Full Code [id number]  Alfredia Ferguson, PA-C Inpatient   11/04/2015 1717 11/05/2015 1515 Full Code [id number]  Lahoma Crocker, MD Inpatient   04/28/2015 1530 04/29/2015 1645 Full Code [id number]  Hvozdovic, Vita Barley, PA-C Inpatient      Home/SNF/Other Home  Chief Complaint possible appendix  Level of Care/Admitting Diagnosis ED Disposition    ED Disposition Condition Comment   Admit  The patient appears reasonably stabilized for admission considering the current resources, flow, and capabilities available in the ED at this time, and I doubt any other [facility] requiring further screening and/or treatment in the ED prior to admission is  present.       Medical History Past Medical History:  Diagnosis Date  . Breast cancer ([place]) 2005   left  . COPD (chronic obstructive pulmonary disease) (HCC)    hx of tobacco use  . Coronary artery spasm ([place])   . Depression   . GERD (gastroesophageal reflux disease)   . Hyperlipemia   . IDA (iron deficiency anemia)    last iron infusion 4 months ago  . Osteoporosis   . 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 Prediabetes    hx of  . Rash    under right breast and groin line using nizoral ointment to q day per dermatology saw few weeks ago  . Stress incontinence wers depends  . 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 Toe fracture, left     Allergies Allergies  Allergen Reactions  . Ceclor [Cefaclor] Other (See Comments)    REACTION: "SEVERE HEADACHE"    IV Location/Drains/Wounds Patient Lines/Drains/Airways Status   Active Line/Drains/Airways    Name:   Placement date:   Placement time:   Site:   Days:   Peripheral IV  05/10/17 Right Antecubital   05/10/17    1855    Antecubital   63   Peripheral IV 07/11/17 Right Hand   07/11/17    2300    Hand   1   Incision (Closed) 11/04/15 Abdomen Other (Comment)   11/04/15    1506     616   Incision (Closed) 12/01/15 Abdomen Right;Left;Mid   12/01/15    1118     589   Incision - 5 Ports Abdomen 1: Left;Lateral;Umbilicus 2: Umbilicus 3: Right;Upper;Umbilicus 4: Right;Medial;Umbilicus 5: Right;Lateral;Umbilicus   97/58/83    2549     616          Labs/Imaging Results for orders placed or performed during the hospital encounter of 07/11/17 (from the past 48 hour(s))  Comprehensive metabolic panel     Status: Abnormal   Collection Time: 07/11/17  9:20 PM  Result Value Ref Range   Sodium 135 135 - 145 mmol/L   Potassium 4.2 3.5 - 5.1 mmol/L   Chloride 99 (L) 101 - 111 mmol/L   CO2 25 22 - 32 mmol/L   Glucose, Bld 113 (H) 65 - 99 mg/dL   BUN 28 (H) 6 - 20 mg/dL   Creatinine, Ser 1.25 (H) 0.44 - 1.00 mg/dL   Calcium 9.0 8.9 - 10.3 mg/dL   Total Protein 7.1 6.5 - 8.1 g/dL  Albumin 3.1 (L) 3.5 - 5.0 g/dL   AST 19 15 - 41 U/L   ALT 15 14 - 54 U/L   Alkaline Phosphatase 93 38 - 126 U/L   Total Bilirubin 0.5 0.3 - 1.2 mg/dL   GFR calc non Af Amer 42 (L) >60 mL/min   GFR calc Af Amer 49 (L) >60 mL/min    Comment: (NOTE) The eGFR has been calculated using the CKD EPI equation. This calculation has not been validated in all clinical situations. eGFR's persistently <60 mL/min signify possible Chronic Kidney Disease.    Anion gap 11 5 - 15    Comment: Performed at [facility], [place] [address], [place], [place] [address]  Lipase, blood     Status: None   Collection Time: 07/11/17  9:20 PM  Result Value Ref Range   Lipase 27 11 - 51 U/L    Comment: Performed at [facility], [place] [address] [place], [place] [address]  CBC     Status: Abnormal   Collection Time: 07/11/17  9:20 PM  Result Value Ref Range   WBC 9.2 4.0 - 10.5  K/uL   RBC 5.25 (H) 3.87 - 5.11 MIL/uL   Hemoglobin 13.3 12.0 - 15.0 g/dL   HCT 42.4 36.0 - 46.0 %   MCV 80.8 78.0 - 100.0 fL   MCH 25.3 (L) 26.0 - 34.0 pg   MCHC 31.4 30.0 - 36.0 g/dL   RDW 18.9 (H) 11.5 - 15.5 %   Platelets 179 150 - 400 K/uL    Comment: Performed at [facility], [place] [address], [place], [place] [address]  Urinalysis, Routine w reflex microscopic     Status: Abnormal   Collection Time: 07/11/17 10:16 PM  Result Value Ref Range   Color, Urine YELLOW YELLOW   APPearance HAZY (A) CLEAR   Specific Gravity, Urine 1.025 1.005 - 1.030   pH 5.0 5.0 - 8.0   Glucose, UA NEGATIVE NEGATIVE mg/dL   Hgb urine dipstick NEGATIVE NEGATIVE   Bilirubin Urine NEGATIVE NEGATIVE   Ketones, ur NEGATIVE NEGATIVE mg/dL   Protein, ur 30 (A) NEGATIVE mg/dL   Nitrite NEGATIVE NEGATIVE   Leukocytes, UA NEGATIVE NEGATIVE   RBC / HPF 6-10 0 - 5 RBC/hpf   WBC, UA 0-5 0 - 5 WBC/hpf   Bacteria, UA NONE SEEN NONE SEEN   Squamous Epithelial / LPF 0-5 0 - 5    Comment: Please note change in reference range.   Mucus PRESENT    Budding Yeast PRESENT    Hyaline Casts, UA PRESENT     Comment: Performed at [facility], [place] [address] [place], [place] [address]   Ct Abdomen Pelvis W Contrast  Result Date: 07/12/2017 CLINICAL DATA:  Right-sided abdominal pain. Previously seen in February with inflamed appendix. EXAM: CT ABDOMEN AND PELVIS WITH CONTRAST TECHNIQUE: Multidetector CT imaging of the abdomen and pelvis was performed using the standard protocol following bolus administration of intravenous contrast. CONTRAST:  81m ISOVUE-300 IOPAMIDOL (ISOVUE-300) INJECTION 61% COMPARISON:  05/10/2017 . FINDINGS: Lower chest: Small right pleural effusion. Atelectasis in the lung bases, greater on the right. Moderate-sized esophageal hiatal hernia. Left breast implant. Hepatobiliary: Mildly radiopaque stones demonstrated in the gallbladder, including 1 in the gallbladder  neck. The gallbladder is distended with thickened edematous wall and pericholecystic infiltration. Changes are consistent with acute cholecystitis. There is suggestion of a stone in the distal common bile duct. No significant bile duct dilatation. No  focal liver lesions. Pancreas: Unremarkable. No pancreatic ductal dilatation or surrounding inflammatory changes. Spleen: Focal wedge-shaped low-attenuation lesion in the spleen, new since previous study. This may indicate a splenic infarct. Adrenals/Urinary Tract: Adrenal glands are unremarkable. Kidneys are normal, without renal calculi, focal lesion, or hydronephrosis. Bladder is unremarkable. Stomach/Bowel: Stomach, small bowel, and colon are not abnormally distended. Scattered stool throughout the colon. Colonic diverticula without evidence of diverticulitis. The appendix is normal. Vascular/Lymphatic: Aortic atherosclerosis. No enlarged abdominal or pelvic lymph nodes. Reproductive: Status post hysterectomy. No adnexal masses. Other: No abdominal wall hernia or abnormality. No abdominopelvic ascites. Musculoskeletal: No acute or significant osseous findings. IMPRESSION: 1. Changes of acute cholecystitis with distended thick-walled gallbladder and surrounding inflammation. Cholelithiasis with stone in the gallbladder neck. Possible stone in the distal common bile duct. 2. Normal appearance of the appendix. 3. Small right pleural effusion with atelectasis in the lung bases. 4. Moderate esophageal hiatal hernia. 5. Focal wedge-shaped low-attenuation lesion in the spleen is new since prior study and may reflect a splenic infarct. 6. Aortic atherosclerosis. Electronically Signed   By: Lucienne [name] M.D.   On: 07/12/2017 01:35    Pending Labs Unresulted Labs (From admission, onward)   None      Vitals/Pain Today's Vitals   07/12/17 0030 07/12/17 0100 07/12/17 0142 07/12/17 0200  BP: 125/61 122/63 (!) 113/97 (!) 119/48  Pulse: 73 72 73   Resp: (!) 21 20  (!) 21 (!) 22  Temp:      TempSrc:      SpO2: 94% 95% 98%   PainSc:        Isolation Precautions No active isolations  Medications Medications  iopamidol (ISOVUE-300) 61 % injection (has no administration in time range)  piperacillin-tazobactam (ZOSYN) IVPB 3.375 g (has no administration in time range)  sodium chloride 0.9 % bolus 500 mL (0 mLs Intravenous Stopped 07/12/17 0040)  iopamidol (ISOVUE-300) 61 % injection 100 mL (80 mLs Intravenous Contrast Given 07/12/17 0109)    Mobility walks

## 2017-07-12 NOTE — ED Notes (Signed)
Off floor for testing 

## 2017-07-12 NOTE — Consult Note (Addendum)
[facility] Surgery Consult Note  Brandy Paul 12/07/1945  [id number].    Requesting MD: Sander Radon Chief Complaint/Reason for Consult: acute cholecystitis  HPI:  Brandy Paul is a 72yo female PMH h/o CAD with coronary artery spasm and CVA with residual right-sided hemiparesis on plavix, who presented to the [facility] late last night with 6 days of RUQ abdominal pain. States that the pain is constant and severe. Nothing makes it worse or better. Denies nausea, vomiting, diarrhea, fever, or chills. States that she has never had any pain like this before.   In the ED she had a CT scan that showed acute cholecystitis with distended thick-walled gallbladder and surrounding inflammation, cholelithiasis with stone in the gallbladder neck and possible stone in the distal common bile duct. Lab work revealed AST 27, ALT 16, alk phos 82, bilirubin 1.3, WBC 9.6. General surgery asked to see.  Of note, patient was recently admitted to [facility] in 04/2017 with acute appendicitis. She was treated nonoperatively due to 10.1% risk of major cardiac event during surgery.  -PMH significant for h/o CAD with coronary artery spasm, h/o CVA with residual right-sided hemiparesis, DM, COPD, GERD, h/o breast cancer, HLD, depression -Abdominal surgical history: hysterectomy, robotic assisted bilateral salpingoophorectomy -Anticoagulants: Plavix (last dose ?07/11/17 at 2100) -Lives at home with her husband -Uses cane or walker for ambulation  ROS: Review of Systems  Constitutional: Negative.   HENT: Negative.   Eyes: Negative.   Respiratory: Negative.   Cardiovascular: Negative.   Gastrointestinal: Positive for abdominal pain. Negative for constipation, diarrhea, nausea and vomiting.  Genitourinary: Negative.   Musculoskeletal: Negative.   Skin: Negative.   Neurological: Positive for focal weakness.    All systems reviewed and otherwise negative except for as above  Family History  Problem Relation  Age of Onset  . CVA Mother   . Liver cancer Mother   . Heart attack Sister   . Stroke Maternal Grandfather   . Hypertension Sister   . Diabetes Sister   . Arthritis Sister     Past Medical History:  Diagnosis Date  . Breast cancer ([place]) 2005   left  . COPD (chronic obstructive pulmonary disease) (H[facility])    hx of tobacco use  . Coronary artery spasm ([place])   . Depression   . GERD (gastroesophageal reflux disease)   . Hyperlipemia   . IDA (iron deficiency anemia)    last iron infusion 4 months ago  . Osteoporosis   . 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 Prediabetes    hx of  . Rash    under right breast and groin line using nizoral ointment to q day per dermatology saw few weeks ago  . Stress incontinence wers depends  . Stroke [facility]) 2013   paralytic syndrome of dominant right side  . Toe fracture, left     Past Surgical History:  Procedure Laterality Date  . ABDOMINAL HYSTERECTOMY     complete  . COLONOSCOPY WITH PROPOFOL N/A 12/03/2015   Procedure: COLONOSCOPY WITH PROPOFOL;  Surgeon: Mauri Pole, MD;  Location: [facility] ENDOSCOPY;  Service: Endoscopy;  Laterality: N/A;  tba before procdcedure  . MASTECTOMY Left    1 lymph node removed  . RADIAL KERATOTOMY Bilateral   . ROBOTIC ASSISTED BILATERAL SALPINGO OOPHERECTOMY Bilateral 11/04/2015   Procedure: XI ROBOTIC ASSISTED BILATERAL SALPINGO OOPHORECTOMY;  Surgeon: Everitt Amber, MD;  Location: [facility] ORS;  Service: Gynecology;  Laterality: Bilateral;  . TONSILLECTOMY      Social  History:  reports that she quit smoking about 23 years ago. Her smoking use included cigarettes. She has never used smokeless tobacco. She reports that she does not drink alcohol or use drugs.  Allergies:  Allergies  Allergen Reactions  . Ceclor [Cefaclor] Other (See Comments)    REACTION: "SEVERE HEADACHE"     (Not in a hospital admission)  Prior to Admission medications   Medication Sig Start Date End Date Taking? Authorizing Provider   acetaminophen (TYLENOL) 500 MG tablet Take 500 mg by mouth daily as needed for mild pain.    Yes [provider]  aspirin 81 MG tablet Take 81 mg by mouth every evening.    Yes [provider]  atorvastatin (LIPITOR) 20 MG tablet Take 20 mg by mouth at bedtime.  10/31/14  Yes [provider]  buPROPion (WELLBUTRIN SR) 150 MG 12 hr tablet Take 1 tablet by mouth daily. 05/18/17  Yes [provider]  Calcium Carbonate-Vitamin D (OSCAL 500/200 D-3 PO) Take 1 tablet by mouth every evening.    Yes [provider]  clopidogrel (PLAVIX) 75 MG tablet Take 1 tablet (75 mg total) by mouth at bedtime. 11/07/15  Yes Cross, Melissa D, NP  ezetimibe (ZETIA) 10 MG tablet Take 10 mg by mouth at bedtime.    Yes [provider]  isosorbide mononitrate (IMDUR) 60 MG 24 hr tablet TAKE 1 TABLET BY MOUTH DAILY 04/25/17  Yes Belva Crome, MD  nitroGLYCERIN (NITROSTAT) 0.4 MG SL tablet Place 0.4 mg under the tongue every 5 (five) minutes as needed for chest pain (Call 911 at 3rd dose within 15 minutes).   Yes [provider]  RABEprazole (ACIPHEX) 20 MG tablet Take 20 mg by mouth at bedtime.  10/29/14  Yes [provider]  sertraline (ZOLOFT) 25 MG tablet Take 50 mg by mouth at bedtime.  11/17/14  Yes [provider]  tiotropium (SPIRIVA HANDIHALER) 18 MCG inhalation capsule Place 18 mcg into inhaler and inhale daily.    Yes [provider]    Blood pressure (!) 112/46, pulse 76, temperature 98 F (36.7 C), temperature source Oral, resp. rate 15, SpO2 94 %. Physical Exam: General: pleasant, WD/WN white female who is laying in bed in NAD HEENT: head is normocephalic, atraumatic.  Sclera are noninjected.  Pupils equal and round.  Ears and nose without any masses or lesions.  Mouth is pink and moist. Dentition fair Heart: regular, rate, and rhythm.  No obvious murmurs, gallops, or rubs noted.  Palpable pedal pulses bilaterally Lungs: CTAB,  no wheezes, rhonchi, or rales noted.  Respiratory effort nonlabored Abd: multiple well healed lap incisions, soft, nondistended, +TTP RUQ and epigastric region, +BS, no masses, hernias, or organomegaly. +Murphy sign MS: mild BLE edema, calves soft and nontender Skin: warm and dry with no masses, lesions, or rashes Psych: A&Ox3 with an appropriate affect. Neuro: cranial nerves grossly intact. Weakness noted to RUE/RLE. Sensory and motor function intact LUE/LLE  Results for orders placed or performed during the hospital encounter of 07/11/17 (from the past 48 hour(s))  Comprehensive metabolic panel     Status: Abnormal   Collection Time: 07/11/17  9:20 PM  Result Value Ref Range   Sodium 135 135 - 145 mmol/L   Potassium 4.2 3.5 - 5.1 mmol/L   Chloride 99 (L) 101 - 111 mmol/L   CO2 25 22 - 32 mmol/L   Glucose, Bld 113 (H) 65 - 99 mg/dL   BUN 28 (H) 6 - 20  mg/dL   Creatinine, Ser 1.25 (H) 0.44 - 1.00 mg/dL   Calcium 9.0 8.9 - 10.3 mg/dL   Total Protein 7.1 6.5 - 8.1 g/dL   Albumin 3.1 (L) 3.5 - 5.0 g/dL   AST 19 15 - 41 U/L   ALT 15 14 - 54 U/L   Alkaline Phosphatase 93 38 - 126 U/L   Total Bilirubin 0.5 0.3 - 1.2 mg/dL   GFR calc non Af Amer 42 (L) >60 mL/min   GFR calc Af Amer 49 (L) >60 mL/min    Comment: (NOTE) The eGFR has been calculated using the CKD EPI equation. This calculation has not been validated in all clinical situations. eGFR's persistently <60 mL/min signify possible Chronic Kidney Disease.    Anion gap 11 5 - 15    Comment: Performed at [facility], [place] [address] [place], [place] [address]  Lipase, blood     Status: None   Collection Time: 07/11/17  9:20 PM  Result Value Ref Range   Lipase 27 11 - 51 U/L    Comment: Performed at [facility], [place] [address] [place], [place] [address]  CBC     Status: Abnormal   Collection Time: 07/11/17  9:20 PM  Result Value Ref Range   WBC 9.2 4.0 - 10.5 K/uL   RBC 5.25 (H) 3.87 -  5.11 MIL/uL   Hemoglobin 13.3 12.0 - 15.0 g/dL   HCT 42.4 36.0 - 46.0 %   MCV 80.8 78.0 - 100.0 fL   MCH 25.3 (L) 26.0 - 34.0 pg   MCHC 31.4 30.0 - 36.0 g/dL   RDW 18.9 (H) 11.5 - 15.5 %   Platelets 179 150 - 400 K/uL    Comment: Performed at [facility], [place] [address], [place], [place] [address]  Urinalysis, Routine w reflex microscopic     Status: Abnormal   Collection Time: 07/11/17 10:16 PM  Result Value Ref Range   Color, Urine YELLOW YELLOW   APPearance HAZY (A) CLEAR   Specific Gravity, Urine 1.025 1.005 - 1.030   pH 5.0 5.0 - 8.0   Glucose, UA NEGATIVE NEGATIVE mg/dL   Hgb urine dipstick NEGATIVE NEGATIVE   Bilirubin Urine NEGATIVE NEGATIVE   Ketones, ur NEGATIVE NEGATIVE mg/dL   Protein, ur 30 (A) NEGATIVE mg/dL   Nitrite NEGATIVE NEGATIVE   Leukocytes, UA NEGATIVE NEGATIVE   RBC / HPF 6-10 0 - 5 RBC/hpf   WBC, UA 0-5 0 - 5 WBC/hpf   Bacteria, UA NONE SEEN NONE SEEN   Squamous Epithelial / LPF 0-5 0 - 5    Comment: Please note change in reference range.   Mucus PRESENT    Budding Yeast PRESENT    Hyaline Casts, UA PRESENT     Comment: Performed at [facility], [place] [address] [place], [place] [address]  Basic metabolic panel     Status: Abnormal   Collection Time: 07/12/17  5:01 AM  Result Value Ref Range   Sodium 134 (L) 135 - 145 mmol/L   Potassium 4.3 3.5 - 5.1 mmol/L    Comment: MODERATE HEMOLYSIS   Chloride 99 (L) 101 - 111 mmol/L   CO2 24 22 - 32 mmol/L   Glucose, Bld 107 (H) 65 - 99 mg/dL   BUN 24 (H) 6 - 20 mg/dL   Creatinine, Ser 1.08 (H) 0.44 - 1.00 mg/dL   Calcium 8.2 (L) 8.9 - 10.3 mg/dL   GFR calc non Af  Amer 50 (L) >60 mL/min   GFR calc Af Amer 58 (L) >60 mL/min    Comment: (NOTE) The eGFR has been calculated using the CKD EPI equation. This calculation has not been validated in all clinical situations. eGFR's persistently <60 mL/min signify possible Chronic Kidney Disease.    Anion gap 11 5 - 15     Comment: Performed at [facility], [place] [address], [place], [place] [address]  CBC     Status: Abnormal   Collection Time: 07/12/17  5:01 AM  Result Value Ref Range   WBC 9.6 4.0 - 10.5 K/uL   RBC 4.68 3.87 - 5.11 MIL/uL   Hemoglobin 12.1 12.0 - 15.0 g/dL   HCT 37.8 36.0 - 46.0 %   MCV 80.8 78.0 - 100.0 fL   MCH 25.9 (L) 26.0 - 34.0 pg   MCHC 32.0 30.0 - 36.0 g/dL   RDW 19.1 (H) 11.5 - 15.5 %   Platelets 178 150 - 400 K/uL    Comment: Performed at [facility], [place] [address], [place], [place] [address]  Hepatic function panel     Status: Abnormal   Collection Time: 07/12/17  5:01 AM  Result Value Ref Range   Total Protein 6.5 6.5 - 8.1 g/dL   Albumin 2.7 (L) 3.5 - 5.0 g/dL   AST 27 15 - 41 U/L   ALT 16 14 - 54 U/L   Alkaline Phosphatase 82 38 - 126 U/L   Total Bilirubin 1.3 (H) 0.3 - 1.2 mg/dL   Bilirubin, Direct 0.4 0.1 - 0.5 mg/dL   Indirect Bilirubin 0.9 0.3 - 0.9 mg/dL    Comment: Performed at [facility], [place] [address] [place], [place] [address]  CBG monitoring, ED     Status: Abnormal   Collection Time: 07/12/17  6:29 AM  Result Value Ref Range   Glucose-Capillary 104 (H) 65 - 99 mg/dL   Ct Abdomen Pelvis W Contrast  Result Date: 07/12/2017 CLINICAL DATA:  Right-sided abdominal pain. Previously seen in February with inflamed appendix. EXAM: CT ABDOMEN AND PELVIS WITH CONTRAST TECHNIQUE: Multidetector CT imaging of the abdomen and pelvis was performed using the standard protocol following bolus administration of intravenous contrast. CONTRAST:  39m ISOVUE-300 IOPAMIDOL (ISOVUE-300) INJECTION 61% COMPARISON:  05/10/2017 . FINDINGS: Lower chest: Small right pleural effusion. Atelectasis in the lung bases, greater on the right. Moderate-sized esophageal hiatal hernia. Left breast implant. Hepatobiliary: Mildly radiopaque stones demonstrated in the gallbladder, including 1 in the gallbladder neck. The gallbladder is  distended with thickened edematous wall and pericholecystic infiltration. Changes are consistent with acute cholecystitis. There is suggestion of a stone in the distal common bile duct. No significant bile duct dilatation. No focal liver lesions. Pancreas: Unremarkable. No pancreatic ductal dilatation or surrounding inflammatory changes. Spleen: Focal wedge-shaped low-attenuation lesion in the spleen, new since previous study. This may indicate a splenic infarct. Adrenals/Urinary Tract: Adrenal glands are unremarkable. Kidneys are normal, without renal calculi, focal lesion, or hydronephrosis. Bladder is unremarkable. Stomach/Bowel: Stomach, small bowel, and colon are not abnormally distended. Scattered stool throughout the colon. Colonic diverticula without evidence of diverticulitis. The appendix is normal. Vascular/Lymphatic: Aortic atherosclerosis. No enlarged abdominal or pelvic lymph nodes. Reproductive: Status post hysterectomy. No adnexal masses. Other: No abdominal wall hernia or abnormality. No abdominopelvic ascites. Musculoskeletal: No acute or significant osseous findings. IMPRESSION: 1. Changes of acute cholecystitis with distended thick-walled gallbladder and surrounding inflammation. Cholelithiasis with stone in the gallbladder neck. Possible stone in  the distal common bile duct. 2. Normal appearance of the appendix. 3. Small right pleural effusion with atelectasis in the lung bases. 4. Moderate esophageal hiatal hernia. 5. Focal wedge-shaped low-attenuation lesion in the spleen is new since prior study and may reflect a splenic infarct. 6. Aortic atherosclerosis. Electronically Signed   By: Lucienne [name] M.D.   On: 07/12/2017 01:35   Anti-infectives (From admission, onward)   Start     Dose/Rate Route Frequency Ordered Stop   07/12/17 1000  piperacillin-tazobactam (ZOSYN) IVPB 3.375 g     3.375 g 12.5 mL/hr over 240 Minutes Intravenous Every 8 hours 07/12/17 0441     07/12/17 0215   piperacillin-tazobactam (ZOSYN) IVPB 3.375 g     3.375 g 100 mL/hr over 30 Minutes Intravenous  Once 07/12/17 0205 07/12/17 0438        Assessment/Plan H/o CAD with coronary artery spasm H/o CVA with residual right-sided hemiparesis - last dose Plavix ?07/11/17 at 2100 COPD DM GERD H/o breast cancer HLD Depression  Acute cholecystitis  - CT scan shows acute cholecystitis with distended thick-walled gallbladder and surrounding inflammation, cholelithiasis with stone in the gallbladder neck and possible stone in the distal common bile duct - AST 27, ALT 16, alk phos 82, bilirubin 1.3 - WBC 9.6 - per cardiology consult 05/11/17, patient has a 10.1% risk of major cardiac event given history of coronary spasm associated with myocardial injury and prior CVA  ID - zosyn 4/25>> VTE - SCDs FEN - IVF, NPO Foley - none Follow up - TBD  Plan - Discussed treatment options of acute cholecystitis with Ms. Meckes including laparoscopic cholecystectomy versus percutaneous cholecystostomy tube. She wishes to avoid surgery, so I will consult IR for consideration of perc chole tube. Continue IVF, NPO, and IV antibiotics. Hold plavix. CT shows possibility of choledocholithiasis and bilirubin is mildly elevated; u/s ordered to evaluate for common bile duct dilation.  Will continue to follow.  Addendum: U/s and CT scan inconclusive. Discussed with Dr. Laurence Ferrari in IR, will obtain HIDA scan to determine if this is acute cholecystitis vs chronic cholecystitis.   Wellington Hampshire, [facility] Surgery 07/12/2017, 7:38 AM Pager: [phone] Consults: [phone] Mon-Fri 7:00 am-4:30 pm Sat-Sun 7:00 am-11:30 am

## 2017-07-12 NOTE — ED Provider Notes (Signed)
12:00 AM  Assumed care from Dr. Leonette Monarch.  72 year old female who presents emergency department with right-sided abdominal pain for the past 2 days.  Patient had appendicitis at the end of February and did not undergo surgical treatment given she was considered moderate risk secondary to cardiac history.  Patient is on Plavix.  Urine shows no sign of infection.  Hemodynamically stable.  Plan is to obtain labs, CT of abdomen pelvis.   2:15 AM  Pt's labs are unremarkable.  No leukocytosis.  Normal LFTs and lipase.  Mildly elevated creatinine at 1.25.  CT scan concerning for acute cholecystitis with distended, thick-walled gallbladder with surrounding inflammation with cholelithiasis with a stone in the gallbladder neck and possibly as well in the common bile duct.  Appendix appears normal.  Will give IV Zosyn.  Will discuss with general surgery.  3:40 AM  D/w Dr. Reece Agar with general surgery.  He feels patient would likely not be operative candidate for cholecystectomy either given recent concerns after appendicitis in February.  Recommends medicine admission.  Surgery will see patient in consult.   3:54 AM Discussed patient's case with hospitalist, Dr. Hal Hope.  I have recommended admission and patient (and family if present) agree with this plan. Admitting physician will place admission orders.   I reviewed all nursing notes, vitals, pertinent previous records, EKGs, lab and urine results, imaging (as available).     Ward, Delice Bison, DO 07/12/17 [phone]

## 2017-07-12 NOTE — Progress Notes (Signed)
PROGRESS NOTE    Brandy Paul  XIP:[id number] DOB: 03-03-1946 DOA: 07/11/2017 PCP: Velna Hatchet, MD   Brief Narrative:  Brandy Paul is a 72 year old female who presented for RUQ abdominal pain for 6 days. She has a past medical history significant for CAD with coronary artery spasm, stroke with right sided weakness and expressive aphasia, breast cancer and COPD. She was recently admitted 2 months ago for appendicitis but was not a candidate for surgery due to her moderate cardiac risk factors. She denies any fever, chills, nausea, vomiting, diarrhea. Reports the pain is constant and severe, not alleviated/aggrevated by food. States she has never had symptoms like this before. Upon admission, blood pressure 113/45 mmHg, temperature 98.5, pulse 108, respirations 18, oxygen saturation 92% on room air. Sodium 135, Potassium 4.2, Chloride 99, CO2 25, Glucose 113, BUN 28, Creatinine 1.25, Calcium 9, Anion gap 11, lipase 27, AST 19, ALT 15, GFR 42. WBC 9.2, Hgb 13.3, platelets 179, indirect bilirubin 0.9, direct bilirubin 0.4. UA negative for UTI, EKG normal sinus rhythm with T wave abnormality in the inferior leads. CT abdomen and pelvis showed acute cholecystitis and cholelithiasis with stone in gallbladder neck, appendix normal in appearance. US abdomen showed gallstones in the gallbladder consistent with acute cholecystitis.   Patient was admitted with a working diagnosis of abdominal pain due to acute cholecystitis.   Assessment & Plan:   Principal Problem:   Abdominal pain Active Problems:   CVA, old, aphasia   DM (diabetes mellitus), type 2, uncontrolled with complications (HCC)   Hemiparesis affecting right side as late effect of cerebrovascular accident ([place])   Acute cholecystitis   Abdominal pain secondary to acute cholecystitis -CT scan of abdomen showing acute cholecystitis with distended thick-walled gallbladder and surrounding inflammation, cholelithiasis with stone in the  gallbladder neck and possible stone in the distal common bile duct. -Most recent labs AST 27, ALT 16, Alk phosphate 82, Lipase 27, Bilirubin 1.3, WBC 9.6. -Per cardiology consult on 05/11/17, patient has a 10.1% risk of major cardiac event given cardiac history. -Surgery on board and much appreciated. -Patient states she does not wish to pursue surgery, IR was consulted for consideration of percutaneous cholecystostomy tube. -HIDA scan ordered by surgery to evaluate for acute versus chronic cholecystitis.  -Continue IV fluids, NPO and Zosyn IV. -Continue to monitor.  Diabetes Mellitus type 2 -Continue sliding scale insulin.  History of CVA -Patient previously on Plavix for anticoagulation, last dose 4/24. -Residual right sided hemiparesis.  -Continue to monitor.  DVT prophylaxis: SCD's Code Status: FULL. Family Communication: None at bedside. Disposition Plan: Home when clinically improved.   Consultants:   General surgery  IR  Procedures:   None.  Antimicrobials:  Zosyn 4/25 >>    Subjective: Reports continuing to have right sided abdominal pain in RUQ and RLQ, denies nausea, vomiting, diarrhea, hematochezia, hematuria, dysuria.   Objective: Vitals:   07/12/17 0658 07/12/17 0730 07/12/17 0800 07/12/17 1113  BP: (!) 112/46 (!) 123/47 (!) 120/49 (!) 118/33  Pulse: 76 66 69 72  Resp: _0 Temp:      TempSrc:      SpO2: 94% 95% 93% 96%    Intake/Output Summary (Last 24 hours) at 07/12/2017 1131 Last data filed at 07/12/2017 0438 Gross per 24 hour  Intake 600 ml  Output -  Net 600 ml   There were no vitals filed for this visit.  Examination:  General exam: Appears calm and comfortable  Respiratory  system: Clear to auscultation. Respiratory effort normal. Cardiovascular system: S1 & S2 heard, RRR. No murmurs, rubs, gallops or clicks. Bilateral nonpitting lower extremity edema.  Gastrointestinal system: Abdomen is nondistended, soft, tender to  palpation in the RUQ and RLQ. No masses or hernias. Normal bowel sounds heard. Central nervous system: Alert and oriented, weakness noted in the RUE/RLE. Extremities: Moves all four extremities.  Skin: No rashes, lesions or ulcers. Psychiatry: Delayed responses. Judgement and insight appear normal. Mood & affect appropriate.   Data Reviewed: I have personally reviewed following labs and imaging studies  CBC: Recent Labs  Lab 07/11/17 2120 07/12/17 0501  WBC 9.2 9.6  HGB 13.3 12.1  HCT 42.4 37.8  MCV 80.8 80.8  PLT 179 147   Basic Metabolic Panel: Recent Labs  Lab 07/11/17 2120 07/12/17 0501  NA 135 134*  K 4.2 4.3  CL 99* 99*  CO2 25 24  GLUCOSE 113* 107*  BUN 28* 24*  CREATININE 1.25* 1.08*  CALCIUM 9.0 8.2*   GFR: CrCl cannot be calculated (Unknown ideal weight.). Liver Function Tests: Recent Labs  Lab 07/11/17 2120 07/12/17 0501  AST 19 27  ALT 15 16  ALKPHOS 93 82  BILITOT 0.5 1.3*  PROT 7.1 6.5  ALBUMIN 3.1* 2.7*   Recent Labs  Lab 07/11/17 2120  LIPASE 27   No results for input(s): AMMONIA in the last 168 hours. Coagulation Profile: No results for input(s): INR, PROTIME in the last 168 hours. Cardiac Enzymes: No results for input(s): CKTOTAL, CKMB, CKMBINDEX, TROPONINI in the last 168 hours. BNP (last 3 results) No results for input(s): PROBNP in the last 8760 hours. HbA1C: No results for input(s): HGBA1C in the last 72 hours. CBG: Recent Labs  Lab 07/12/17 0629 07/12/17 0817 07/12/17 0953  GLUCAP 104* 103* 105*   Lipid Profile: No results for input(s): CHOL, HDL, LDLCALC, TRIG, CHOLHDL, LDLDIRECT in the last 72 hours. Thyroid Function Tests: No results for input(s): TSH, T4TOTAL, FREET4, T3FREE, THYROIDAB in the last 72 hours. Anemia Panel: No results for input(s): VITAMINB12, FOLATE, FERRITIN, TIBC, IRON, RETICCTPCT in the last 72 hours. Sepsis Labs: No results for input(s): PROCALCITON, LATICACIDVEN in the last 168 hours.  No  results found for this or any previous visit (from the past 240 hour(s)).       Radiology Studies: Ct Abdomen Pelvis W Contrast  Result Date: 07/12/2017 CLINICAL DATA:  Right-sided abdominal pain. Previously seen in February with inflamed appendix. EXAM: CT ABDOMEN AND PELVIS WITH CONTRAST TECHNIQUE: Multidetector CT imaging of the abdomen and pelvis was performed using the standard protocol following bolus administration of intravenous contrast. CONTRAST:  62m ISOVUE-300 IOPAMIDOL (ISOVUE-300) INJECTION 61% COMPARISON:  05/10/2017 . FINDINGS: Lower chest: Small right pleural effusion. Atelectasis in the lung bases, greater on the right. Moderate-sized esophageal hiatal hernia. Left breast implant. Hepatobiliary: Mildly radiopaque stones demonstrated in the gallbladder, including 1 in the gallbladder neck. The gallbladder is distended with thickened edematous wall and pericholecystic infiltration. Changes are consistent with acute cholecystitis. There is suggestion of a stone in the distal common bile duct. No significant bile duct dilatation. No focal liver lesions. Pancreas: Unremarkable. No pancreatic ductal dilatation or surrounding inflammatory changes. Spleen: Focal wedge-shaped low-attenuation lesion in the spleen, new since previous study. This may indicate a splenic infarct. Adrenals/Urinary Tract: Adrenal glands are unremarkable. Kidneys are normal, without renal calculi, focal lesion, or hydronephrosis. Bladder is unremarkable. Stomach/Bowel: Stomach, small bowel, and colon are not abnormally distended. Scattered stool throughout the colon. Colonic diverticula  without evidence of diverticulitis. The appendix is normal. Vascular/Lymphatic: Aortic atherosclerosis. No enlarged abdominal or pelvic lymph nodes. Reproductive: Status post hysterectomy. No adnexal masses. Other: No abdominal wall hernia or abnormality. No abdominopelvic ascites. Musculoskeletal: No acute or significant osseous findings.  IMPRESSION: 1. Changes of acute cholecystitis with distended thick-walled gallbladder and surrounding inflammation. Cholelithiasis with stone in the gallbladder neck. Possible stone in the distal common bile duct. 2. Normal appearance of the appendix. 3. Small right pleural effusion with atelectasis in the lung bases. 4. Moderate esophageal hiatal hernia. 5. Focal wedge-shaped low-attenuation lesion in the spleen is new since prior study and may reflect a splenic infarct. 6. Aortic atherosclerosis. Electronically Signed   By: Lucienne [name] M.D.   On: 07/12/2017 01:35   US Abdomen Limited  Result Date: 07/12/2017 CLINICAL DATA:  Right upper quadrant pain for 1 week, history of breast carcinoma EXAM: ULTRASOUND ABDOMEN LIMITED RIGHT UPPER QUADRANT COMPARISON:  CT abdomen pelvis of 07/12/2016 FINDINGS: Gallbladder: The gallbladder is well visualized and there are gallstones present, the largest of 2.2 cm. However there is no pain over the gallbladder with compression. The gallbladder wall is slightly prominent at 3.6 mm. Common bile duct: Diameter: The common bile duct is within upper limits of normal measuring 7.3 cm. Evaluation of the common bile duct is somewhat limited by overlying bowel gas. Liver: The parenchyma of the liver is normal in echogenicity. No focal hepatic abnormality is seen. Portal vein is patent on color Doppler imaging with normal direction of blood flow towards the liver. IMPRESSION: 1. Gallstones. The gallbladder wall is slightly prominent but no pain is present over the gallbladder with compression currently to indicate acute cholecystitis. 2. Minimal prominence of the common bile duct to maximum diameter of 7.3 cm. 3. No hepatic abnormality is seen. Electronically Signed   By: Ivar Drape M.D.   On: 07/12/2017 09:22        Scheduled Meds: . insulin aspart  0-9 Units Subcutaneous Q4H  . iopamidol      . tiotropium  18 mcg Inhalation Daily   Continuous Infusions: . sodium  chloride 75 mL/hr at 07/12/17 0630  . piperacillin-tazobactam (ZOSYN)  IV 3.375 g (07/12/17 0956)     LOS: 0 days    Time spent: 25 minutes.    Eloy End, PA-S [facility] Pager [phone]  If 7PM-7AM, please contact night-coverage [email or web address] Password [facility] 07/12/2017, 11:31 AM

## 2017-07-12 NOTE — H&P (Signed)
History and Physical    Brandy Paul IRW:[id number] DOB: [birth year] DOA: 07/11/2017  PCP: Velna Hatchet, MD  Patient coming from: Home.  Chief Complaint: Abdominal pain.  HPI: Brandy Paul is a 72 y.o. female with history of CAD with coronary artery spasm, stroke with right-sided weakness and expressive aphasia, breast cancer and COPD who was recently admitted 2 months ago for appendicitis which was managed conservatively due to cardiac risk factors patient did not undergo surgery presents to the ER because of right upper quadrant pain.  Patient has been having this pain for last 4 to 5 days.  Denies nausea vomiting or diarrhea.  Denies fever chills chest pain or shortness of breath.  ED Course: In the ER patient had CAT scan abdomen and pelvis which showed features concerning for acute cholecystitis.  On-call general surgeon Dr. Kieth Brightly was consulted and at this time since patient will be most likely manage nonoperatively hospitalist was requested admission.  Review of Systems: As per HPI, rest all negative.   Past Medical History:  Diagnosis Date  . Breast cancer ([place]) 2005   left  . COPD (chronic obstructive pulmonary disease) (HCC)    hx of tobacco use  . Coronary artery spasm ([place])   . Depression   . GERD (gastroesophageal reflux disease)   . Hyperlipemia   . IDA (iron deficiency anemia)    last iron infusion 4 months ago  . Osteoporosis   . 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 Prediabetes    hx of  . Rash    under right breast and groin line using nizoral ointment to q day per dermatology saw few weeks ago  . Stress incontinence wers depends  . Stroke [facility]) 2013   paralytic syndrome of dominant right side  . Toe fracture, left     Past Surgical History:  Procedure Laterality Date  . ABDOMINAL HYSTERECTOMY     complete  . COLONOSCOPY WITH PROPOFOL N/A 12/03/2015   Procedure: COLONOSCOPY WITH PROPOFOL;  Surgeon: Mauri Pole, MD;  Location: [facility]  ENDOSCOPY;  Service: Endoscopy;  Laterality: N/A;  tba before procdcedure  . MASTECTOMY Left    1 lymph node removed  . RADIAL KERATOTOMY Bilateral   . ROBOTIC ASSISTED BILATERAL SALPINGO OOPHERECTOMY Bilateral 11/04/2015   Procedure: XI ROBOTIC ASSISTED BILATERAL SALPINGO OOPHORECTOMY;  Surgeon: Everitt Amber, MD;  Location: [facility] ORS;  Service: Gynecology;  Laterality: Bilateral;  . TONSILLECTOMY       reports that she quit smoking about 23 years ago. Her smoking use included cigarettes. She has never used smokeless tobacco. She reports that she does not drink alcohol or use drugs.  Allergies  Allergen Reactions  . Ceclor [Cefaclor] Other (See Comments)    REACTION: "SEVERE HEADACHE"    Family History  Problem Relation Age of Onset  . CVA Mother   . Liver cancer Mother   . Heart attack Sister   . Stroke Maternal Grandfather   . Hypertension Sister   . Diabetes Sister   . Arthritis Sister     Prior to Admission medications   Medication Sig Start Date End Date Taking? Authorizing Provider  acetaminophen (TYLENOL) 500 MG tablet Take 500 mg by mouth daily as needed for mild pain.    Yes [provider]  aspirin 81 MG tablet Take 81 mg by mouth every evening.    Yes [provider]  atorvastatin (LIPITOR) 20 MG tablet Take 20 mg by mouth at bedtime.  10/31/14  Yes [provider]  buPROPion (WELLBUTRIN SR) 150 MG 12 hr tablet Take 1 tablet by mouth daily. 05/18/17  Yes [provider]  Calcium Carbonate-Vitamin D (OSCAL 500/200 D-3 PO) Take 1 tablet by mouth every evening.    Yes [provider]  clopidogrel (PLAVIX) 75 MG tablet Take 1 tablet (75 mg total) by mouth at bedtime. 11/07/15  Yes Cross, Melissa D, NP  ezetimibe (ZETIA) 10 MG tablet Take 10 mg by mouth at bedtime.    Yes [provider]  isosorbide mononitrate (IMDUR) 60 MG 24 hr tablet TAKE 1 TABLET BY MOUTH DAILY 04/25/17  Yes Belva Crome, MD  nitroGLYCERIN (NITROSTAT) 0.4 MG  SL tablet Place 0.4 mg under the tongue every 5 (five) minutes as needed for chest pain (Call 911 at 3rd dose within 15 minutes).   Yes [provider]  RABEprazole (ACIPHEX) 20 MG tablet Take 20 mg by mouth at bedtime.  10/29/14  Yes [provider]  sertraline (ZOLOFT) 25 MG tablet Take 50 mg by mouth at bedtime.  11/17/14  Yes [provider]  tiotropium (SPIRIVA HANDIHALER) 18 MCG inhalation capsule Place 18 mcg into inhaler and inhale daily.    Yes [provider]    Physical Exam: Vitals:   07/12/17 0300 07/12/17 0330 07/12/17 0346 07/12/17 0400  BP: (!) 113/59 (!) 107/48 (!) 107/48 (!) 125/47  Pulse: 76  77 76  Resp: (!) 21 (!) 21 (!) 21 20  Temp:      TempSrc:      SpO2: 95%  94% 94%      Constitutional: Moderately built and nourished. Vitals:   07/12/17 0300 07/12/17 0330 07/12/17 0346 07/12/17 0400  BP: (!) 113/59 (!) 107/48 (!) 107/48 (!) 125/47  Pulse: 76  77 76  Resp: (!) 21 (!) 21 (!) 21 20  Temp:      TempSrc:      SpO2: 95%  94% 94%   Eyes: Anicteric no pallor. ENMT: No discharge from the ears eyes nose or mouth. Neck: No mass felt.  No JVD appreciated. Respiratory: No rhonchi or crepitations. Cardiovascular: S1-S2 heard no murmurs appreciated. Abdomen: Mild epigastric tenderness no guarding or rigidity. Musculoskeletal: No edema.  No joint effusion. Skin: No rash.  Skin appears warm. Neurologic: Alert awake oriented to time place and person.  Has weakness in the right side from previous stroke and expressive aphasia. Psychiatric: Appears normal.   Labs on Admission: I have personally reviewed following labs and imaging studies  CBC: Recent Labs  Lab 07/11/17 2120  WBC 9.2  HGB 13.3  HCT 42.4  MCV 80.8  PLT 161   Basic Metabolic Panel: Recent Labs  Lab 07/11/17 2120  NA 135  K 4.2  CL 99*  CO2 25  GLUCOSE 113*  BUN 28*  CREATININE 1.25*  CALCIUM 9.0   GFR: CrCl cannot be calculated (Unknown ideal  weight.). Liver Function Tests: Recent Labs  Lab 07/11/17 2120  AST 19  ALT 15  ALKPHOS 93  BILITOT 0.5  PROT 7.1  ALBUMIN 3.1*   Recent Labs  Lab 07/11/17 2120  LIPASE 27   No results for input(s): AMMONIA in the last 168 hours. Coagulation Profile: No results for input(s): INR, PROTIME in the last 168 hours. Cardiac Enzymes: No results for input(s): CKTOTAL, CKMB, CKMBINDEX, TROPONINI in the last 168 hours. BNP (last 3 results) No results for input(s): PROBNP in the last 8760 hours. HbA1C: No results for input(s): HGBA1C in the  last 72 hours. CBG: No results for input(s): GLUCAP in the last 168 hours. Lipid Profile: No results for input(s): CHOL, HDL, LDLCALC, TRIG, CHOLHDL, LDLDIRECT in the last 72 hours. Thyroid Function Tests: No results for input(s): TSH, T4TOTAL, FREET4, T3FREE, THYROIDAB in the last 72 hours. Anemia Panel: No results for input(s): VITAMINB12, FOLATE, FERRITIN, TIBC, IRON, RETICCTPCT in the last 72 hours. Urine analysis:    Component Value Date/Time   COLORURINE YELLOW 07/11/2017 2216   APPEARANCEUR HAZY (A) 07/11/2017 2216   LABSPEC 1.025 07/11/2017 2216   PHURINE 5.0 07/11/2017 2216   GLUCOSEU NEGATIVE 07/11/2017 2216   HGBUR NEGATIVE 07/11/2017 2216   [place] NEGATIVE 07/11/2017 2216   [employer] 07/11/2017 2216   PROTEINUR 30 (A) 07/11/2017 2216   NITRITE NEGATIVE 07/11/2017 2216   LEUKOCYTESUR NEGATIVE 07/11/2017 2216   Sepsis Labs: @LABRCNTIP (procalcitonin:4,lacticidven:4) )No results found for this or any previous visit (from the past 240 hour(s)).   Radiological Exams on Admission: Ct Abdomen Pelvis W Contrast  Result Date: 07/12/2017 CLINICAL DATA:  Right-sided abdominal pain. Previously seen in February with inflamed appendix. EXAM: CT ABDOMEN AND PELVIS WITH CONTRAST TECHNIQUE: Multidetector CT imaging of the abdomen and pelvis was performed using the standard protocol following bolus administration of intravenous  contrast. CONTRAST:  28mL ISOVUE-300 IOPAMIDOL (ISOVUE-300) INJECTION 61% COMPARISON:  05/10/2017 . FINDINGS: Lower chest: Small right pleural effusion. Atelectasis in the lung bases, greater on the right. Moderate-sized esophageal hiatal hernia. Left breast implant. Hepatobiliary: Mildly radiopaque stones demonstrated in the gallbladder, including 1 in the gallbladder neck. The gallbladder is distended with thickened edematous wall and pericholecystic infiltration. Changes are consistent with acute cholecystitis. There is suggestion of a stone in the distal common bile duct. No significant bile duct dilatation. No focal liver lesions. Pancreas: Unremarkable. No pancreatic ductal dilatation or surrounding inflammatory changes. Spleen: Focal wedge-shaped low-attenuation lesion in the spleen, new since previous study. This may indicate a splenic infarct. Adrenals/Urinary Tract: Adrenal glands are unremarkable. Kidneys are normal, without renal calculi, focal lesion, or hydronephrosis. Bladder is unremarkable. Stomach/Bowel: Stomach, small bowel, and colon are not abnormally distended. Scattered stool throughout the colon. Colonic diverticula without evidence of diverticulitis. The appendix is normal. Vascular/Lymphatic: Aortic atherosclerosis. No enlarged abdominal or pelvic lymph nodes. Reproductive: Status post hysterectomy. No adnexal masses. Other: No abdominal wall hernia or abnormality. No abdominopelvic ascites. Musculoskeletal: No acute or significant osseous findings. IMPRESSION: 1. Changes of acute cholecystitis with distended thick-walled gallbladder and surrounding inflammation. Cholelithiasis with stone in the gallbladder neck. Possible stone in the distal common bile duct. 2. Normal appearance of the appendix. 3. Small right pleural effusion with atelectasis in the lung bases. 4. Moderate esophageal hiatal hernia. 5. Focal wedge-shaped low-attenuation lesion in the spleen is new since prior study and may  reflect a splenic infarct. 6. Aortic atherosclerosis. Electronically Signed   By: Lucienne [name] M.D.   On: 07/12/2017 01:35    Assessment/Plan Principal Problem:   Abdominal pain Active Problems:   CVA, old, aphasia   DM (diabetes mellitus), type 2, uncontrolled with complications (HCC)   Hemiparesis affecting right side as late effect of cerebrovascular accident ([place])   Acute cholecystitis    1. Abdominal pain likely from acute cholecystitis -General surgery has been consulted.  At this time since patient was recently managed for acute appendicitis conservatively general surgery feels patient likely would be nonoperable candidate given her cardiac risk factors.  Patient is continued on Zosyn and further recommendations per surgery. 2. Acute renal failure likely  from poor oral intake -gently hydrate and recheck metabolic panel. 3. History of CAD with coronary artery spasm -denies any chest pain. 4. History of COPD presently not wheezing. 5. History of diabetes per the chart patient states she is noting any medications for this. 6. History of breast cancer.   DVT prophylaxis: SCDs. Code Status: Full code. Family Communication: Discussed with patient. Disposition Plan: Home. Consults called: General surgery. Admission status: Inpatient.   Rise Patience MD [facility] Pager [phone].  If 7PM-7AM, please contact night-coverage [email or web address] Password [facility]  07/12/2017, 4:36 AM

## 2017-07-12 NOTE — ED Notes (Signed)
ED TO INPATIENT HANDOFF REPORT  Name/Age/Gender Brandy Paul 72 y.o. female  Code Status    Code Status Orders  (From admission, onward)        Start     Ordered   07/12/17 0434  Full code  Continuous     07/12/17 0435    Code Status History    Date Active Date Inactive Code Status Order ID Comments User Context   05/10/2017 2210 05/15/2017 1450 Full Code [id number]  Jovita Kussmaul, MD ED   12/01/2015 1153 12/03/2015 1834 Full Code [id number]  Alfredia Ferguson, PA-C Inpatient   11/04/2015 1717 11/05/2015 1515 Full Code [id number]  Lahoma Crocker, MD Inpatient   04/28/2015 1530 04/29/2015 1645 Full Code [id number]  Hvozdovic, Vita Barley, PA-C Inpatient    Advance Directive Documentation     Most Recent Value  Type of Advance Directive  Living will  Pre-existing out of facility DNR order (yellow form or pink MOST form)  -  "MOST" Form in Place?  -      Home/SNF/Other Home  Chief Complaint possible appendix  Level of Care/Admitting Diagnosis ED Disposition    ED Disposition Condition [place]: [place] [[id number]]  Level of Care: Telemetry [5]  Admit to tele based on following criteria: Monitor for Ischemic changes  Diagnosis: Abdominal pain [867619]  Admitting Physician: Rise Patience [[id number]]  Attending Physician: Rise Patience [phone]  Estimated length of stay: past midnight tomorrow  Certification:: I certify this patient will need inpatient services for at least 2 midnights  PT Class (Do Not Modify): Inpatient [101]  PT Acc Code (Do Not Modify): Private [1]       Medical History Past Medical History:  Diagnosis Date  . Breast cancer ([place]) 2005   left  . COPD (chronic obstructive pulmonary disease) (HCC)    hx of tobacco use  . Coronary artery spasm ([place])   . Depression   . GERD (gastroesophageal reflux disease)   . Hyperlipemia   . IDA (iron deficiency anemia)    last iron infusion 4 months ago  .  Osteoporosis   . 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 Prediabetes    hx of  . Rash    under right breast and groin line using nizoral ointment to q day per dermatology saw few weeks ago  . Stress incontinence wers depends  . 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 Toe fracture, left     Allergies Allergies  Allergen Reactions  . Ceclor [Cefaclor] Other (See Comments)    REACTION: "SEVERE HEADACHE"    IV Location/Drains/Wounds Patient Lines/Drains/Airways Status   Active Line/Drains/Airways    Name:   Placement date:   Placement time:   Site:   Days:   Peripheral IV 05/10/17 Right Antecubital   05/10/17    1855    Antecubital   63   Peripheral IV 07/11/17 Right Hand   07/11/17    2300    Hand   1   Incision (Closed) 11/04/15 Abdomen Other (Comment)   11/04/15    1506     616   Incision (Closed) 12/01/15 Abdomen Right;Left;Mid   12/01/15    1118     589   Incision - 5 Ports Abdomen 1: Left;Lateral;Umbilicus 2: Umbilicus 3: Right;Upper;Umbilicus 4: Right;Medial;Umbilicus 5: Right;Lateral;Umbilicus   24/58/09    9833     616  Labs/Imaging Results for orders placed or performed during the hospital encounter of 07/11/17 (from the past 48 hour(s))  Comprehensive metabolic panel     Status: Abnormal   Collection Time: 07/11/17  9:20 PM  Result Value Ref Range   Sodium 135 135 - 145 mmol/L   Potassium 4.2 3.5 - 5.1 mmol/L   Chloride 99 (L) 101 - 111 mmol/L   CO2 25 22 - 32 mmol/L   Glucose, Bld 113 (H) 65 - 99 mg/dL   BUN 28 (H) 6 - 20 mg/dL   Creatinine, Ser 1.25 (H) 0.44 - 1.00 mg/dL   Calcium 9.0 8.9 - 10.3 mg/dL   Total Protein 7.1 6.5 - 8.1 g/dL   Albumin 3.1 (L) 3.5 - 5.0 g/dL   AST 19 15 - 41 U/L   ALT 15 14 - 54 U/L   Alkaline Phosphatase 93 38 - 126 U/L   Total Bilirubin 0.5 0.3 - 1.2 mg/dL   GFR calc non Af Amer 42 (L) >60 mL/min   GFR calc Af Amer 49 (L) >60 mL/min    Comment: (NOTE) The eGFR has been calculated using the  CKD EPI equation. This calculation has not been validated in all clinical situations. eGFR's persistently <60 mL/min signify possible Chronic Kidney Disease.    Anion gap 11 5 - 15    Comment: Performed at [facility], [place] [address], [place], [place] [address]  Lipase, blood     Status: None   Collection Time: 07/11/17  9:20 PM  Result Value Ref Range   Lipase 27 11 - 51 U/L    Comment: Performed at [facility], [place] [address], [place], [place] [address]  CBC     Status: Abnormal   Collection Time: 07/11/17  9:20 PM  Result Value Ref Range   WBC 9.2 4.0 - 10.5 K/uL   RBC 5.25 (H) 3.87 - 5.11 MIL/uL   Hemoglobin 13.3 12.0 - 15.0 g/dL   HCT 42.4 36.0 - 46.0 %   MCV 80.8 78.0 - 100.0 fL   MCH 25.3 (L) 26.0 - 34.0 pg   MCHC 31.4 30.0 - 36.0 g/dL   RDW 18.9 (H) 11.5 - 15.5 %   Platelets 179 150 - 400 K/uL    Comment: Performed at [facility], [place] [address], [place], [place] [address]  Urinalysis, Routine w reflex microscopic     Status: Abnormal   Collection Time: 07/11/17 10:16 PM  Result Value Ref Range   Color, Urine YELLOW YELLOW   APPearance HAZY (A) CLEAR   Specific Gravity, Urine 1.025 1.005 - 1.030   pH 5.0 5.0 - 8.0   Glucose, UA NEGATIVE NEGATIVE mg/dL   Hgb urine dipstick NEGATIVE NEGATIVE   Bilirubin Urine NEGATIVE NEGATIVE   Ketones, ur NEGATIVE NEGATIVE mg/dL   Protein, ur 30 (A) NEGATIVE mg/dL   Nitrite NEGATIVE NEGATIVE   Leukocytes, UA NEGATIVE NEGATIVE   RBC / HPF 6-10 0 - 5 RBC/hpf   WBC, UA 0-5 0 - 5 WBC/hpf   Bacteria, UA NONE SEEN NONE SEEN   Squamous Epithelial / LPF 0-5 0 - 5    Comment: Please note change in reference range.   Mucus PRESENT    Budding Yeast PRESENT    Hyaline Casts, UA PRESENT     Comment: Performed at [facility], [place] [address] [place], [place] [address]  Basic metabolic panel     Status: Abnormal   Collection Time: 07/12/17  5:01 AM  Result Value  Ref Range   Sodium 134 (L) 135 - 145 mmol/L   Potassium 4.3 3.5 - 5.1 mmol/L    Comment: MODERATE HEMOLYSIS   Chloride 99 (L) 101 - 111 mmol/L   CO2 24 22 - 32 mmol/L   Glucose, Bld 107 (H) 65 - 99 mg/dL   BUN 24 (H) 6 - 20 mg/dL   Creatinine, Ser 1.08 (H) 0.44 - 1.00 mg/dL   Calcium 8.2 (L) 8.9 - 10.3 mg/dL   GFR calc non Af Amer 50 (L) >60 mL/min   GFR calc Af Amer 58 (L) >60 mL/min    Comment: (NOTE) The eGFR has been calculated using the CKD EPI equation. This calculation has not been validated in all clinical situations. eGFR's persistently <60 mL/min signify possible Chronic Kidney Disease.    Anion gap 11 5 - 15    Comment: Performed at [facility], [place] [address] [place], [place] [address]  CBC     Status: Abnormal   Collection Time: 07/12/17  5:01 AM  Result Value Ref Range   WBC 9.6 4.0 - 10.5 K/uL   RBC 4.68 3.87 - 5.11 MIL/uL   Hemoglobin 12.1 12.0 - 15.0 g/dL   HCT 37.8 36.0 - 46.0 %   MCV 80.8 78.0 - 100.0 fL   MCH 25.9 (L) 26.0 - 34.0 pg   MCHC 32.0 30.0 - 36.0 g/dL   RDW 19.1 (H) 11.5 - 15.5 %   Platelets 178 150 - 400 K/uL    Comment: Performed at [facility], [place] [address] [place], [place] [address]  Hepatic function panel     Status: Abnormal   Collection Time: 07/12/17  5:01 AM  Result Value Ref Range   Total Protein 6.5 6.5 - 8.1 g/dL   Albumin 2.7 (L) 3.5 - 5.0 g/dL   AST 27 15 - 41 U/L   ALT 16 14 - 54 U/L   Alkaline Phosphatase 82 38 - 126 U/L   Total Bilirubin 1.3 (H) 0.3 - 1.2 mg/dL   Bilirubin, Direct 0.4 0.1 - 0.5 mg/dL   Indirect Bilirubin 0.9 0.3 - 0.9 mg/dL    Comment: Performed at [facility], [place] [address], [place], [place] [address]  CBG monitoring, ED     Status: Abnormal   Collection Time: 07/12/17  6:29 AM  Result Value Ref Range   Glucose-Capillary 104 (H) 65 - 99 mg/dL  CBG monitoring, ED     Status: Abnormal   Collection Time: 07/12/17  8:17 AM  Result Value Ref  Range   Glucose-Capillary 103 (H) 65 - 99 mg/dL  CBG monitoring, ED     Status: Abnormal   Collection Time: 07/12/17  9:53 AM  Result Value Ref Range   Glucose-Capillary 105 (H) 65 - 99 mg/dL  CBG monitoring, ED     Status: Abnormal   Collection Time: 07/12/17 12:09 PM  Result Value Ref Range   Glucose-Capillary 101 (H) 65 - 99 mg/dL  Protime-INR     Status: None   Collection Time: 07/12/17 12:16 PM  Result Value Ref Range   Prothrombin Time 13.5 11.4 - 15.2 seconds   INR 1.04     Comment: Performed at [facility], [place] [address] [place], [place] [address]   Ct Abdomen Pelvis W Contrast  Result Date: 07/12/2017 CLINICAL DATA:  Right-sided abdominal pain. Previously seen in February with inflamed appendix. EXAM: CT ABDOMEN AND PELVIS WITH CONTRAST  TECHNIQUE: Multidetector CT imaging of the abdomen and pelvis was performed using the standard protocol following bolus administration of intravenous contrast. CONTRAST:  60m ISOVUE-300 IOPAMIDOL (ISOVUE-300) INJECTION 61% COMPARISON:  05/10/2017 . FINDINGS: Lower chest: Small right pleural effusion. Atelectasis in the lung bases, greater on the right. Moderate-sized esophageal hiatal hernia. Left breast implant. Hepatobiliary: Mildly radiopaque stones demonstrated in the gallbladder, including 1 in the gallbladder neck. The gallbladder is distended with thickened edematous wall and pericholecystic infiltration. Changes are consistent with acute cholecystitis. There is suggestion of a stone in the distal common bile duct. No significant bile duct dilatation. No focal liver lesions. Pancreas: Unremarkable. No pancreatic ductal dilatation or surrounding inflammatory changes. Spleen: Focal wedge-shaped low-attenuation lesion in the spleen, new since previous study. This may indicate a splenic infarct. Adrenals/Urinary Tract: Adrenal glands are unremarkable. Kidneys are normal, without renal calculi, focal lesion, or hydronephrosis.  Bladder is unremarkable. Stomach/Bowel: Stomach, small bowel, and colon are not abnormally distended. Scattered stool throughout the colon. Colonic diverticula without evidence of diverticulitis. The appendix is normal. Vascular/Lymphatic: Aortic atherosclerosis. No enlarged abdominal or pelvic lymph nodes. Reproductive: Status post hysterectomy. No adnexal masses. Other: No abdominal wall hernia or abnormality. No abdominopelvic ascites. Musculoskeletal: No acute or significant osseous findings. IMPRESSION: 1. Changes of acute cholecystitis with distended thick-walled gallbladder and surrounding inflammation. Cholelithiasis with stone in the gallbladder neck. Possible stone in the distal common bile duct. 2. Normal appearance of the appendix. 3. Small right pleural effusion with atelectasis in the lung bases. 4. Moderate esophageal hiatal hernia. 5. Focal wedge-shaped low-attenuation lesion in the spleen is new since prior study and may reflect a splenic infarct. 6. Aortic atherosclerosis. Electronically Signed   By: WLucienne CapersM.D.   On: 07/12/2017 01:35   UKoreaAbdomen Limited  Result Date: 07/12/2017 CLINICAL DATA:  Right upper quadrant pain for 1 week, history of breast carcinoma EXAM: ULTRASOUND ABDOMEN LIMITED RIGHT UPPER QUADRANT COMPARISON:  CT abdomen pelvis of 07/12/2016 FINDINGS: Gallbladder: The gallbladder is well visualized and there are gallstones present, the largest of 2.2 cm. However there is no pain over the gallbladder with compression. The gallbladder wall is slightly prominent at 3.6 mm. Common bile duct: Diameter: The common bile duct is within upper limits of normal measuring 7.3 cm. Evaluation of the common bile duct is somewhat limited by overlying bowel gas. Liver: The parenchyma of the liver is normal in echogenicity. No focal hepatic abnormality is seen. Portal vein is patent on color Doppler imaging with normal direction of blood flow towards the liver. IMPRESSION: 1.  Gallstones. The gallbladder wall is slightly prominent but no pain is present over the gallbladder with compression currently to indicate acute cholecystitis. 2. Minimal prominence of the common bile duct to maximum diameter of 7.3 cm. 3. No hepatic abnormality is seen. Electronically Signed   By: PIvar DrapeM.D.   On: 07/12/2017 09:22    Pending Labs Unresulted Labs (From admission, onward)   Start     Ordered   07/13/17 0500  Comprehensive metabolic panel  Tomorrow morning,   R     07/12/17 1125   07/13/17 0500  CBC  Tomorrow morning,   R     07/12/17 1125      Vitals/Pain Today's Vitals   07/12/17 0800 07/12/17 1113 07/12/17 1130 07/12/17 1200  BP: (!) 120/49 (!) 118/33 122/67 (!) 112/47  Pulse: 69 72 73 71  Resp: 16 19 (!) 22 19  Temp:      TempSrc:  SpO2: 93% 96% 95% 92%  PainSc:        Isolation Precautions No active isolations  Medications Medications  iopamidol (ISOVUE-300) 61 % injection (has no administration in time range)  nitroGLYCERIN (NITROSTAT) SL tablet 0.4 mg (has no administration in time range)  tiotropium (SPIRIVA) inhalation capsule 18 mcg ( Inhalation Canceled Entry 07/12/17 1000)  acetaminophen (TYLENOL) tablet 650 mg (has no administration in time range)    Or  acetaminophen (TYLENOL) suppository 650 mg (has no administration in time range)  ondansetron (ZOFRAN) tablet 4 mg (has no administration in time range)    Or  ondansetron (ZOFRAN) injection 4 mg (has no administration in time range)  insulin aspart (novoLOG) injection 0-9 Units (0 Units Subcutaneous Not Given 07/12/17 1400)  0.9 %  sodium chloride infusion ( Intravenous New Bag/Given 07/12/17 0630)  morphine 2 MG/ML injection 2 mg (has no administration in time range)  piperacillin-tazobactam (ZOSYN) IVPB 3.375 g (0 g Intravenous Stopped 07/12/17 1414)  sodium chloride 0.9 % bolus 500 mL (0 mLs Intravenous Stopped 07/12/17 0040)  iopamidol (ISOVUE-300) 61 % injection 100 mL (80 mLs  Intravenous Contrast Given 07/12/17 0109)  piperacillin-tazobactam (ZOSYN) IVPB 3.375 g (0 g Intravenous Stopped 07/12/17 0438)    Mobility walks with device

## 2017-07-12 NOTE — Plan of Care (Signed)
Patient stable, wants something to eat.  No c/o pain or nausea since admission to unit.  Husband at bedside.

## 2017-07-12 NOTE — [place] Notes (Signed)
PROGRESS NOTE    Brandy Paul  BDZ:[id number] DOB: [birth year] DOA: 07/11/2017 PCP: Velna Hatchet, MD    Brief Narrative:  72 year old female who presented with abdominal pain. She does have significant past medical history for coronary artery disease, stroke with right hemiparesis, expressive aphasia, breast cancer and COPD.  Patient presents with right upper quadrant abdominal pain, constant for the last 4 to 7 days prior to hospitalization, no associated nausea vomiting, fevers or chills.  Initial physical examination blood pressure 113/59, heart rate 76, respiratory 21, oxygen saturation 94%, lungs are clear to auscultation bilaterally, heart S1-S2 present and rhythmic, abdomen with epigastric tenderness, no guarding or rigidity, no lower extremity edema, positive right hemiparesis.  CT of the abdomen with acute cholecystitis, distended thick walled gallbladder and surrounding inflammation.  Cholelithiasis with stone in the gallbladder neck.  Possible stone in the distal common bile duct.   Patient was admitted to hospital with the working diagnosis of acute cholecystitis.  Assessment & Plan:   Principal Problem:   Abdominal pain Active Problems:   CVA, old, aphasia   DM (diabetes mellitus), type 2, uncontrolled with complications (HCC)   Hemiparesis affecting right side as late effect of cerebrovascular accident ([place])   Acute cholecystitis  1. Acute cholecystitis. Will continue conservative care, will allow to have ice chips, continue analgesics and antiemetics, IV fluids for hydration and IV antibiotic therapy with Zosyn. Follow on surgery and IR recommendations. Patient has been labeled high cardiovascular risk for surgical interventions.   2. AKI. Will continue IV fluids, follow on renal panel in am, avoid hypotension or nephrotoxic medications.   3. CAD. No active chest pain.   4. COPD. No signs of exacerbation, will continue oxymetry monitoring.    5. T2DM. Will continue  glucose cover and monitoring with insulin sliding scale.   6. Depression. Will resume wellbutrin and sertraline.   DVT prophylaxis: enoxaparin    Code Status: full Family Communication: no family at the bedside Disposition Plan:  Home when improved   Consultants:   Surgery   IR  Procedures:     Antimicrobials:   Zosyn IV    Subjective: Patient with persistent abdominal pain, moderate in intensity, no radiation, no nausea or vomiting, no chest pain or dyspnea.   Objective: Vitals:   07/12/17 1113 07/12/17 1130 07/12/17 1200 07/12/17 1645  BP: (!) 118/33 122/67 (!) 112/47 (!) 116/52  Pulse: 72 73 71 76  Resp: 19 (!) 22 19 18   Temp:    98.9 F (37.2 C)  TempSrc:    Oral  SpO2: 96% 95% 92% 94%    Intake/Output Summary (Last 24 hours) at 07/12/2017 1716 Last data filed at 07/12/2017 1414 Gross per 24 hour  Intake 650 ml  Output -  Net 650 ml   There were no vitals filed for this visit.  Examination:   General: Not in pain or dyspnea, deconditioned Neurology: Awake and alert, non focal  E ENT: mild pallor, no icterus, oral mucosa moist Cardiovascular: No JVD. S1-S2 present, rhythmic, no gallops, rubs, or murmurs. No lower extremity edema. Pulmonary: decreased breath sounds bilaterally at bases, adequate air movement, no wheezing, rhonchi or rales. Gastrointestinal. Abdomen protuberant, tender at the right upper quadrant, no organomegaly, no rebound or guarding Skin. No rashes Musculoskeletal: no joint deformities     Data Reviewed: I have personally reviewed following labs and imaging studies  CBC: Recent Labs  Lab 07/11/17 2120 07/12/17 0501  WBC 9.2 9.6  HGB 13.3 12.1  HCT 42.4 37.8  MCV 80.8 80.8  PLT 179 259   Basic Metabolic Panel: Recent Labs  Lab 07/11/17 2120 07/12/17 0501  NA 135 134*  K 4.2 4.3  CL 99* 99*  CO2 25 24  GLUCOSE 113* 107*  BUN 28* 24*  CREATININE 1.25* 1.08*  CALCIUM 9.0 8.2*   GFR: CrCl cannot be calculated  (Unknown ideal weight.). Liver Function Tests: Recent Labs  Lab 07/11/17 2120 07/12/17 0501  AST 19 27  ALT 15 16  ALKPHOS 93 82  BILITOT 0.5 1.3*  PROT 7.1 6.5  ALBUMIN 3.1* 2.7*   Recent Labs  Lab 07/11/17 2120  LIPASE 27   No results for input(s): AMMONIA in the last 168 hours. Coagulation Profile: Recent Labs  Lab 07/12/17 1216  INR 1.04   Cardiac Enzymes: No results for input(s): CKTOTAL, CKMB, CKMBINDEX, TROPONINI in the last 168 hours. BNP (last 3 results) No results for input(s): PROBNP in the last 8760 hours. HbA1C: No results for input(s): HGBA1C in the last 72 hours. CBG: Recent Labs  Lab 07/12/17 0629 07/12/17 0817 07/12/17 0953 07/12/17 1209  GLUCAP 104* 103* 105* 101*   Lipid Profile: No results for input(s): CHOL, HDL, LDLCALC, TRIG, CHOLHDL, LDLDIRECT in the last 72 hours. Thyroid Function Tests: No results for input(s): TSH, T4TOTAL, FREET4, T3FREE, THYROIDAB in the last 72 hours. Anemia Panel: No results for input(s): VITAMINB12, FOLATE, FERRITIN, TIBC, IRON, RETICCTPCT in the last 72 hours.    Radiology Studies: I have reviewed all of the imaging during this hospital visit personally     Scheduled Meds: . insulin aspart  0-9 Units Subcutaneous Q4H  . tiotropium  18 mcg Inhalation Daily   Continuous Infusions: . sodium chloride 75 mL/hr at 07/12/17 0630  . piperacillin-tazobactam (ZOSYN)  IV Stopped (07/12/17 1414)     LOS: 0 days        Brandy Paul Gerome Apley, MD [facility] Pager [phone]

## 2017-07-12 NOTE — [place] Notes (Signed)
Pharmacy Antibiotic Note  Brandy Paul is a 72 y.o. female admitted on 07/11/2017 with IAI.  Pharmacy has been consulted for zosyn dosing.  Plan: Zosyn 3.375g IV q8h (4 hour infusion).  Pharmacy to sign off   Temp (24hrs), Avg:98.3 F (36.8 C), Min:98 F (36.7 C), Max:98.5 F (36.9 C)  Recent Labs  Lab 07/11/17 2120  WBC 9.2  CREATININE 1.25*    CrCl cannot be calculated (Unknown ideal weight.).    Allergies  Allergen Reactions  . Ceclor [Cefaclor] Other (See Comments)    REACTION: "SEVERE HEADACHE"   Thank you for allowing pharmacy to be a part of this patient's care.  Eudelia Bunch, Pharm.D. [phone] 07/12/2017 4:43 AM

## 2017-07-12 NOTE — ED Notes (Signed)
Pt returned from CT °

## 2017-07-13 ENCOUNTER — Encounter (HOSPITAL_COMMUNITY): Payer: Self-pay | Admitting: Interventional Radiology

## 2017-07-13 ENCOUNTER — Inpatient Hospital Stay (HOSPITAL_COMMUNITY): Payer: Medicare Other

## 2017-07-13 DIAGNOSIS — K819 Cholecystitis, unspecified: Secondary | ICD-10-CM

## 2017-07-13 HISTORY — PX: IR PERC CHOLECYSTOSTOMY: IMG2326

## 2017-07-13 LAB — COMPREHENSIVE METABOLIC PANEL
ALT: 44 U/L (ref 14–54)
AST: 59 U/L — AB (ref 15–41)
Albumin: 2.6 g/dL — ABNORMAL LOW (ref 3.5–5.0)
Alkaline Phosphatase: 214 U/L — ABNORMAL HIGH (ref 38–126)
Anion gap: 9 (ref 5–15)
BILIRUBIN TOTAL: 0.7 mg/dL (ref 0.3–1.2)
BUN: 18 mg/dL (ref 6–20)
CALCIUM: 8.1 mg/dL — AB (ref 8.9–10.3)
CO2: 24 mmol/L (ref 22–32)
CREATININE: 0.97 mg/dL (ref 0.44–1.00)
Chloride: 106 mmol/L (ref 101–111)
GFR calc Af Amer: >60 mL/min (ref >60)
GFR, EST NON AFRICAN AMERICAN: 57 mL/min — AB (ref >60)
Glucose, Bld: 89 mg/dL (ref 65–99)
POTASSIUM: 3.8 mmol/L (ref 3.5–5.1)
Sodium: 139 mmol/L (ref 135–145)
TOTAL PROTEIN: 5.8 g/dL — AB (ref 6.5–8.1)

## 2017-07-13 LAB — CBC
HEMATOCRIT: 36.2 % (ref 36.0–46.0)
Hemoglobin: 11.4 g/dL — ABNORMAL LOW (ref 12.0–15.0)
MCH: 25.9 pg — ABNORMAL LOW (ref 26.0–34.0)
MCHC: 31.5 g/dL (ref 30.0–36.0)
MCV: 82.1 fL (ref 78.0–100.0)
PLATELETS: 176 10*3/uL (ref 150–400)
RBC: 4.41 MIL/uL (ref 3.87–5.11)
RDW: 18.9 % — AB (ref 11.5–15.5)
WBC: 6.5 10*3/uL (ref 4.0–10.5)

## 2017-07-13 LAB — GLUCOSE, CAPILLARY
GLUCOSE-CAPILLARY: 84 mg/dL (ref 65–99)
GLUCOSE-CAPILLARY: 90 mg/dL (ref 65–99)
Glucose-Capillary: 87 mg/dL (ref 65–99)
Glucose-Capillary: 90 mg/dL (ref 65–99)
Glucose-Capillary: 140 mg/dL — ABNORMAL HIGH (ref 65–99)
Glucose-Capillary: 93 mg/dL (ref 65–99)

## 2017-07-13 MED ORDER — LIDOCAINE HCL 1 % IJ SOLN
INTRAMUSCULAR | Status: AC | PRN
  Administered 2017-07-13: 5 mL

## 2017-07-13 MED ORDER — SODIUM CHLORIDE 0.9% FLUSH
5.0000 mL | Freq: Three times a day (TID) | INTRAVENOUS | Status: DC
  Administered 2017-07-13 – 2017-07-16 (×9): 5 mL

## 2017-07-13 MED ORDER — IOPAMIDOL (ISOVUE-300) INJECTION 61%
INTRAVENOUS | Status: AC
  Administered 2017-07-13: 5 mL
  Filled 2017-07-13: qty 50

## 2017-07-13 MED ORDER — FENTANYL CITRATE (PF) 100 MCG/2ML IJ SOLN
INTRAMUSCULAR | Status: AC | PRN
  Administered 2017-07-13 (×2): 50 ug via INTRAVENOUS

## 2017-07-13 MED ORDER — MIDAZOLAM HCL 2 MG/2ML IJ SOLN
INTRAMUSCULAR | Status: AC
  Filled 2017-07-13: qty 4

## 2017-07-13 MED ORDER — IOPAMIDOL (ISOVUE-300) INJECTION 61%
50.0000 mL | Freq: Once | INTRAVENOUS | Status: AC | PRN
  Administered 2017-07-13: 5 mL

## 2017-07-13 MED ORDER — LIDOCAINE HCL 1 % IJ SOLN
INTRAMUSCULAR | Status: AC
  Filled 2017-07-13: qty 20

## 2017-07-13 MED ORDER — FENTANYL CITRATE (PF) 100 MCG/2ML IJ SOLN
INTRAMUSCULAR | Status: AC
  Filled 2017-07-13: qty 4

## 2017-07-13 MED ORDER — MIDAZOLAM HCL 2 MG/2ML IJ SOLN
INTRAMUSCULAR | Status: AC | PRN
  Administered 2017-07-13: 1 mg via INTRAVENOUS

## 2017-07-13 NOTE — Progress Notes (Signed)
[facility] Progress Note     Subjective: CC- RUQ pain HIDA scan positive for acute cholecystitis. Patient scheduled for percutaneous cholecystostomy tube placement in IR today.  Husband at bedside. Patient states that she feels about the same as yesterday. Continues to have intermittent RUQ abdominal pain. Denies n/v. WBC WNL, bilirubin WNL.  Objective: Vital signs in last 24 hours: Temp:  [98.9 F (37.2 C)-99.5 F (37.5 C)] 99.4 F (37.4 C) (04/26 0425) Pulse Rate:  [66-76] 72 (04/26 0425) Resp:  [18-22] 18 (04/26 0425) BP: (105-122)/(33-67) 118/40 (04/26 0425) SpO2:  [91 %-96 %] 91 % (04/26 0801) Weight:  [205 lb 4 oz (93.1 kg)] 205 lb 4 oz (93.1 kg) (04/25 1645) Last BM Date: 07/09/17  Intake/Output from previous day: 04/25 0701 - 04/26 0700 In: 2022.5 [I.V.:1822.5; IV Piggyback:200] Out: 250 [Urine:250] Intake/Output this shift: Total I/O In: -  Out: 100 [Urine:100]  PE: Gen:  Alert, NAD, pleasant HEENT: EOM's intact, pupils equal and round Card:  RRR, no M/G/R heard Pulm:  CTAB, no W/R/R, effort normal Abd: soft, nondistended, +TTP RUQ and epigastric region, +BS, no masses, hernias, or organomegaly. +Murphy sign Ext:  mild BLE edema, calves soft and nontender Neuro: Weakness noted to RUE/RLE. Sensory and motor function intact LUE/LLE  Lab Results:  Recent Labs    07/12/17 0501 07/13/17 0402  WBC 9.6 6.5  HGB 12.1 11.4*  HCT 37.8 36.2  PLT 178 176   BMET Recent Labs    07/12/17 0501 07/13/17 0402  NA 134* 139  K 4.3 3.8  CL 99* 106  CO2 24 24  GLUCOSE 107* 89  BUN 24* 18  CREATININE 1.08* 0.97  CALCIUM 8.2* 8.1*   PT/INR Recent Labs    07/12/17 1216  LABPROT 13.5  INR 1.04   CMP     Component Value Date/Time   NA 139 07/13/2017 0402   K 3.8 07/13/2017 0402   CL 106 07/13/2017 0402   CO2 24 07/13/2017 0402   GLUCOSE 89 07/13/2017 0402   BUN 18 07/13/2017 0402   CREATININE 0.97 07/13/2017 0402   CALCIUM 8.1 (L)  07/13/2017 0402   PROT 5.8 (L) 07/13/2017 0402   ALBUMIN 2.6 (L) 07/13/2017 0402   AST 59 (H) 07/13/2017 0402   ALT 44 07/13/2017 0402   ALKPHOS 214 (H) 07/13/2017 0402   BILITOT 0.7 07/13/2017 0402   GFRNONAA 57 (L) 07/13/2017 0402   GFRAA >60 07/13/2017 0402   Lipase     Component Value Date/Time   LIPASE 27 07/11/2017 2120       Studies/Results: Ct Abdomen Pelvis W Contrast  Result Date: 07/12/2017 CLINICAL DATA:  Right-sided abdominal pain. Previously seen in February with inflamed appendix. EXAM: CT ABDOMEN AND PELVIS WITH CONTRAST TECHNIQUE: Multidetector CT imaging of the abdomen and pelvis was performed using the standard protocol following bolus administration of intravenous contrast. CONTRAST:  43mL ISOVUE-300 IOPAMIDOL (ISOVUE-300) INJECTION 61% COMPARISON:  05/10/2017 . FINDINGS: Lower chest: Small right pleural effusion. Atelectasis in the lung bases, greater on the right. Moderate-sized esophageal hiatal hernia. Left breast implant. Hepatobiliary: Mildly radiopaque stones demonstrated in the gallbladder, including 1 in the gallbladder neck. The gallbladder is distended with thickened edematous wall and pericholecystic infiltration. Changes are consistent with acute cholecystitis. There is suggestion of a stone in the distal common bile duct. No significant bile duct dilatation. No focal liver lesions. Pancreas: Unremarkable. No pancreatic ductal dilatation or surrounding inflammatory changes. Spleen: Focal wedge-shaped low-attenuation lesion in the spleen, new since  previous study. This may indicate a splenic infarct. Adrenals/Urinary Tract: Adrenal glands are unremarkable. Kidneys are normal, without renal calculi, focal lesion, or hydronephrosis. Bladder is unremarkable. Stomach/Bowel: Stomach, small bowel, and colon are not abnormally distended. Scattered stool throughout the colon. Colonic diverticula without evidence of diverticulitis. The appendix is normal.  Vascular/Lymphatic: Aortic atherosclerosis. No enlarged abdominal or pelvic lymph nodes. Reproductive: Status post hysterectomy. No adnexal masses. Other: No abdominal wall hernia or abnormality. No abdominopelvic ascites. Musculoskeletal: No acute or significant osseous findings. IMPRESSION: 1. Changes of acute cholecystitis with distended thick-walled gallbladder and surrounding inflammation. Cholelithiasis with stone in the gallbladder neck. Possible stone in the distal common bile duct. 2. Normal appearance of the appendix. 3. Small right pleural effusion with atelectasis in the lung bases. 4. Moderate esophageal hiatal hernia. 5. Focal wedge-shaped low-attenuation lesion in the spleen is new since prior study and may reflect a splenic infarct. 6. Aortic atherosclerosis. Electronically Signed   By: Lucienne [name] M.D.   On: 07/12/2017 01:35   Nm Hepato W/eject Fract  Result Date: 07/12/2017 CLINICAL DATA:  Right upper quadrant abdominal pain. EXAM: NUCLEAR MEDICINE HEPATOBILIARY IMAGING TECHNIQUE: Sequential images of the abdomen were obtained out to 60 minutes following intravenous administration of radiopharmaceutical. RADIOPHARMACEUTICALS:  5.4 mCi Tc-13m  Choletec IV COMPARISON:  CT scan and ultrasound of same day. FINDINGS: Prompt uptake and biliary excretion of activity by the liver is seen. No filling of gallbladder is noted, consistent with cystic duct obstruction. Biliary activity passes into small bowel, consistent with patent common bile duct. IMPRESSION: No filling of gallbladder is noted consistent with cystic duct obstruction. This is concerning for acute cholecystitis. Electronically Signed   By: Marijo Conception, M.D.   On: 07/12/2017 17:09   US Abdomen Limited  Result Date: 07/12/2017 CLINICAL DATA:  Right upper quadrant pain for 1 week, history of breast carcinoma EXAM: ULTRASOUND ABDOMEN LIMITED RIGHT UPPER QUADRANT COMPARISON:  CT abdomen pelvis of 07/12/2016 FINDINGS: Gallbladder:  The gallbladder is well visualized and there are gallstones present, the largest of 2.2 cm. However there is no pain over the gallbladder with compression. The gallbladder wall is slightly prominent at 3.6 mm. Common bile duct: Diameter: The common bile duct is within upper limits of normal measuring 7.3 cm. Evaluation of the common bile duct is somewhat limited by overlying bowel gas. Liver: The parenchyma of the liver is normal in echogenicity. No focal hepatic abnormality is seen. Portal vein is patent on color Doppler imaging with normal direction of blood flow towards the liver. IMPRESSION: 1. Gallstones. The gallbladder wall is slightly prominent but no pain is present over the gallbladder with compression currently to indicate acute cholecystitis. 2. Minimal prominence of the common bile duct to maximum diameter of 7.3 cm. 3. No hepatic abnormality is seen. Electronically Signed   By: Ivar Drape M.D.   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 - last dose Plavix ?07/11/17 at 2100 COPD DM GERD  H/o breast cancer HLD Depression  Acute cholecystitis  - CT scan shows acute cholecystitis with distended thick-walled gallbladder and surrounding inflammation, cholelithiasis with stone in the gallbladder neck and possible stone in the distal common bile duct - u/s showed gallstones, gallbladder wall is slightly prominent but no pain is present over the gallbladder with compression currently to indicate acute  cholecystitis - HIDA scan positive for acute cholecystitis - per cardiology consult 05/11/17, patient has a 10.1% risk of major cardiac event given history of coronary spasm associated with myocardial injury and prior CVA - discussed treatment options including cholecystectomy vs percutaneous cholecystostomy tube, patient and husband decided to proceed with perc chole tube  ID - zosyn 4/25>> VTE - SCDs FEN - IVF, NPO Foley - none Follow up - Dr. Hassell Done, IR  Plan - Going to IR today for percutaneous cholecystostomy tube. Continue IV antibiotics.   LOS: 1 day    Wellington [place] , [facility] 07/13/2017, 10:44 AM Pager: [phone] Consults: [phone] Mon-Fri 7:00 am-4:30 pm Sat-Sun 7:00 am-11:30 am

## 2017-07-13 NOTE — Procedures (Signed)
Interventional Radiology Procedure Note  Procedure: Image guided perc cholecystostomy   Complications: None  Recommendations:  - to gravity drain - Do not submerge   - Routine drain care - culture follow up   Signed,  Dulcy Fanny. Earleen Newport, DO

## 2017-07-13 NOTE — Progress Notes (Signed)
Received pt from IR in stable condition. Bilary drain in place and patent, flushed tube with 5 cc of NS tol well. SRP, RN

## 2017-07-13 NOTE — Progress Notes (Signed)
PROGRESS NOTE    Brandy Paul  GYI:[id number] DOB: 1945/12/01 DOA: 07/11/2017 PCP: Velna Hatchet, MD    Brief Narrative:  72 year old female who presented with abdominal pain. She does have significant past medical history for coronary artery disease, stroke with right hemiparesis, expressive aphasia, breast cancer and COPD.  Patient presents with right upper quadrant abdominal pain, constant for the last 4 to 7 days prior to hospitalization, no associated nausea vomiting, fevers or chills.  Initial physical examination blood pressure 113/59, heart rate 76, respiratory 21, oxygen saturation 94%, lungs are clear to auscultation bilaterally, heart S1-S2 present and rhythmic, abdomen with epigastric tenderness, no guarding or rigidity, no lower extremity edema, positive right hemiparesis.  CT of the abdomen with acute cholecystitis, distended thick walled gallbladder and surrounding inflammation.  Cholelithiasis with stone in the gallbladder neck.  Possible stone in the distal common bile duct.   Patient was admitted to hospital with the working diagnosis of acute cholecystitis.   Assessment & Plan:   Principal Problem:   Abdominal pain Active Problems:   CVA, old, aphasia   DM (diabetes mellitus), type 2, uncontrolled with complications (HCC)   Hemiparesis affecting right side as late effect of cerebrovascular accident ([place])   Acute cholecystitis  1. Acute cholecystitis. Continue antibiotic therapy with IV Zosyn, will continue IV fluids, IV analgesics and as needed antiemetics. Patient had cholecystomy tube in place. Will continue to follow with surgical recommendations.  2. AKI. Improved renal function with serum cr at 0.97, K at 3.8 and serum bicarbonate at 24.   3. CAD. Stable with no active chest pain.   4. COPD. Oxymetry monitoring, and supplemental -02 per [place] as needed.     5. T2DM. Glucose cover and monitoring with insulin sliding scale. Capillary glucose 91, 84, 90, 87,  90. Patient on clear liquid diet.   6. Depression. Tolerating well wellbutrin and sertraline.   DVT prophylaxis: enoxaparin    Code Status: full Family Communication: no family at the bedside Disposition Plan:  Home when improved   Consultants:   Surgery   IR  Procedures:     Antimicrobials:   Zosyn IV    Subjective: Patient sp percutaneous cholecystostomy tube, mild somnolence, no nausea or vomiting, no chest pain or dyspnea. Abdominal pain has improved.   Objective: Vitals:   07/13/17 1145 07/13/17 1150 07/13/17 1155 07/13/17 1203  BP: 137/72 119/66 (!) 119/58 (!) 117/54  Pulse: 76 86 76 74  Resp: 18 11 12 12   Temp:      TempSrc:      SpO2: 93% 92% 94% 92%  Weight:      Height:        Intake/Output Summary (Last 24 hours) at 07/13/2017 1244 Last data filed at 07/13/2017 1024 Gross per 24 hour  Intake 2022.5 ml  Output 350 ml  Net 1672.5 ml   Filed Weights   07/12/17 1645  Weight: 93.1 kg (205 lb 4 oz)    Examination:   General: Not in pain or dyspnea, deconditioned Neurology: somnolent but easy to arouse E ENT: mild pallor, no icterus, oral mucosa moist Cardiovascular: No JVD. S1-S2 present, rhythmic, no gallops, rubs, or murmurs. No lower extremity edema. Pulmonary: decreased breath sounds bilaterally, adequate air movement, no wheezing, rhonchi or rales. Gastrointestinal. Abdomen protuberant, no organomegaly, non tender, no rebound or guarding. Percutaneous drain at the right upper quadrant, with bloody fluid drainage.  Skin. No rashes Musculoskeletal: no joint deformities     Data Reviewed: I have  personally reviewed following labs and imaging studies  CBC: Recent Labs  Lab 07/11/17 2120 07/12/17 0501 07/13/17 0402  WBC 9.2 9.6 6.5  HGB 13.3 12.1 11.4*  HCT 42.4 37.8 36.2  MCV 80.8 80.8 82.1  PLT 179 178 662   Basic Metabolic Panel: Recent Labs  Lab 07/11/17 2120 07/12/17 0501 07/13/17 0402  NA 135 134* 139  K 4.2 4.3  3.8  CL 99* 99* 106  CO2 25 24 24   GLUCOSE 113* 107* 89  BUN 28* 24* 18  CREATININE 1.25* 1.08* 0.97  CALCIUM 9.0 8.2* 8.1*   GFR: Estimated Creatinine Clearance: 59.1 mL/min (by C-G formula based on SCr of 0.97 mg/dL). Liver Function Tests: Recent Labs  Lab 07/11/17 2120 07/12/17 0501 07/13/17 0402  AST 19 27 59*  ALT 15 16 44  ALKPHOS 93 82 214*  BILITOT 0.5 1.3* 0.7  PROT 7.1 6.5 5.8*  ALBUMIN 3.1* 2.7* 2.6*   Recent Labs  Lab 07/11/17 2120  LIPASE 27   No results for input(s): AMMONIA in the last 168 hours. Coagulation Profile: Recent Labs  Lab 07/12/17 1216  INR 1.04   Cardiac Enzymes: No results for input(s): CKTOTAL, CKMB, CKMBINDEX, TROPONINI in the last 168 hours. BNP (last 3 results) No results for input(s): PROBNP in the last 8760 hours. HbA1C: No results for input(s): HGBA1C in the last 72 hours. CBG: Recent Labs  Lab 07/12/17 1738 07/12/17 1948 07/13/17 0016 07/13/17 0420 07/13/17 0737  GLUCAP 88 91 84 90 87   Lipid Profile: No results for input(s): CHOL, HDL, LDLCALC, TRIG, CHOLHDL, LDLDIRECT in the last 72 hours. Thyroid Function Tests: No results for input(s): TSH, T4TOTAL, FREET4, T3FREE, THYROIDAB in the last 72 hours. Anemia Panel: No results for input(s): VITAMINB12, FOLATE, FERRITIN, TIBC, IRON, RETICCTPCT in the last 72 hours.    Radiology Studies: I have reviewed all of the imaging during this hospital visit personally     Scheduled Meds: . buPROPion  150 mg Oral BID  . insulin aspart  0-9 Units Subcutaneous Q4H  . sertraline  50 mg Oral QHS  . sodium chloride flush  5 mL Intracatheter Q8H  . tiotropium  18 mcg Inhalation Daily   Continuous Infusions: . piperacillin-tazobactam (ZOSYN)  IV 3.375 g (07/13/17 0523)     LOS: 1 day        Khy Pitre Gerome Apley, MD [facility] Pager [phone]

## 2017-07-13 NOTE — Consult Note (Signed)
Chief Complaint: Patient was seen in consultation today for percutaneous cholecystostomy Chief Complaint  Patient presents with  . Abdominal Pain    Referring Physician(s): Martin,M  Supervising Physician: Corrie Mckusick  Patient Status: [facility] - In-pt  History of Present Illness: Brandy Paul is a 72 y.o. female with past medical history significant for coronary artery disease, prior CVA with residual right hemiparesis- on plavix, diabetes, COPD, GERD, left breast cancer, hyperlipidemia and depression.  She was recently admitted to [facility] on 4/24 secondary to persistent right upper quadrant pain. She was also treated nonoperatively for appendicitis in February of this year.  CT of abdomen pelvis performed yesterday revealed changes of acute cholecystitis with distended thick-walled gallbladder and surrounding inflammation.  There was cholelithiasis with stone in the gallbladder neck and possible stone in the distal common bile duct.  Also noted was small right effusion, moderate esophageal hiatal hernia, focal wedge-shaped lesion in the spleen, possibly representing splenic infarct.  HIDA scan performed yesterday revealed no filling of the gallbladder consistent with cystic duct obstruction.  Patient is a poor surgical candidate and request now received from surgery for percutaneous cholecystostomy.  Past Medical History:  Diagnosis Date  . Breast cancer ([place]) 2005   left  . COPD (chronic obstructive pulmonary disease) (HCC)    hx of tobacco use  . Coronary artery spasm ([place])   . Depression   . GERD (gastroesophageal reflux disease)   . Hyperlipemia   . IDA (iron deficiency anemia)    last iron infusion 4 months ago  . Osteoporosis   . 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 Prediabetes    hx of  . Rash    under right breast and groin line using nizoral ointment to q day per dermatology saw few weeks ago  . Stress incontinence wers depends  . Stroke [facility])  2013   paralytic syndrome of dominant right side  . Toe fracture, left     Past Surgical History:  Procedure Laterality Date  . ABDOMINAL HYSTERECTOMY     complete  . COLONOSCOPY WITH PROPOFOL N/A 12/03/2015   Procedure: COLONOSCOPY WITH PROPOFOL;  Surgeon: Mauri Pole, MD;  Location: [facility] ENDOSCOPY;  Service: Endoscopy;  Laterality: N/A;  tba before procdcedure  . MASTECTOMY Left    1 lymph node removed  . RADIAL KERATOTOMY Bilateral   . ROBOTIC ASSISTED BILATERAL SALPINGO OOPHERECTOMY Bilateral 11/04/2015   Procedure: XI ROBOTIC ASSISTED BILATERAL SALPINGO OOPHORECTOMY;  Surgeon: Everitt Amber, MD;  Location: [facility] ORS;  Service: Gynecology;  Laterality: Bilateral;  . TONSILLECTOMY      Allergies: Ceclor [cefaclor]  Medications: Prior to Admission medications   Medication Sig Start Date End Date Taking? Authorizing Provider  acetaminophen (TYLENOL) 500 MG tablet Take 500 mg by mouth daily as needed for mild pain.    Yes [provider]  aspirin 81 MG tablet Take 81 mg by mouth every evening.    Yes [provider]  atorvastatin (LIPITOR) 20 MG tablet Take 20 mg by mouth at bedtime.  10/31/14  Yes [provider]  buPROPion (WELLBUTRIN SR) 150 MG 12 hr tablet Take 1 tablet by mouth daily. 05/18/17  Yes [provider]  Calcium Carbonate-Vitamin D (OSCAL 500/200 D-3 PO) Take 1 tablet by mouth every evening.    Yes [provider]  clopidogrel (PLAVIX) 75 MG tablet Take 1 tablet (75 mg total) by mouth at bedtime. 11/07/15  Yes Cross, Carollee Massed, NP  ezetimibe (ZETIA)  10 MG tablet Take 10 mg by mouth at bedtime.    Yes [provider]  isosorbide mononitrate (IMDUR) 60 MG 24 hr tablet TAKE 1 TABLET BY MOUTH DAILY 04/25/17  Yes Belva Crome, MD  nitroGLYCERIN (NITROSTAT) 0.4 MG SL tablet Place 0.4 mg under the tongue every 5 (five) minutes as needed for chest pain (Call 911 at 3rd dose within 15 minutes).   Yes [provider]    RABEprazole (ACIPHEX) 20 MG tablet Take 20 mg by mouth at bedtime.  10/29/14  Yes [provider]  sertraline (ZOLOFT) 25 MG tablet Take 50 mg by mouth at bedtime.  11/17/14  Yes [provider]  tiotropium (SPIRIVA HANDIHALER) 18 MCG inhalation capsule Place 18 mcg into inhaler and inhale daily.    Yes [provider]     Family History  Problem Relation Age of Onset  . CVA Mother   . Liver cancer Mother   . Heart attack Sister   . Stroke Maternal Grandfather   . Hypertension Sister   . Diabetes Sister   . Arthritis Sister     Social History   Socioeconomic History  . Marital status: Married    Spouse name: Not on file  . Number of children: 2  . Years of education: Not on file  . Highest education level: Not on file  Occupational History  . Occupation: retired  Scientific laboratory technician  . Financial resource strain: Not on file  . Food insecurity:    Worry: Not on file    Inability: Not on file  . Transportation needs:    Medical: Not on file    Non-medical: Not on file  Tobacco Use  . Smoking status: Former Smoker    Types: Cigarettes    Last attempt to quit: 02/17/1994    Years since quitting: 23.4  . Smokeless tobacco: Never Used  Substance and Sexual Activity  . Alcohol use: No    Alcohol/week: 0.0 oz  . Drug use: No  . Sexual activity: Never  Lifestyle  . Physical activity:    Days per week: Not on file    Minutes per session: Not on file  . Stress: Not on file  Relationships  . Social connections:    Talks on phone: Not on file    Gets together: Not on file    Attends religious service: Not on file    Active member of club or organization: Not on file    Attends meetings of clubs or organizations: Not on file    Relationship status: Not on file  Other Topics Concern  . Not on file  Social History Narrative  . Not on file      Review of Systems currently denies fever, headache, chest pain, worsening dyspnea, cough, nausea, vomiting  or bleeding.  She does have right upper quadrant and back discomfort.  Vital Signs: BP (!) 118/40 (BP Location: Left Arm)   Pulse 72   Temp 99.4 F (37.4 C) (Oral)   Resp 18   Ht 5\' 5"  (1.651 m)   Wt 205 lb 4 oz (93.1 kg)   SpO2 91%   BMI 34.16 kg/m   Physical Exam awake, alert.  Chest with slightly diminished breath sounds at bases, more so on the right.  Heart with regular rate and rhythm.  Abdomen obese, soft, right upper quadrant/epigastric tenderness noted, positive bowel sounds; minimal lower extremity edema.  Right hemiparesis.  Imaging: Ct Abdomen Pelvis W Contrast  Result Date:  07/12/2017 CLINICAL DATA:  Right-sided abdominal pain. Previously seen in February with inflamed appendix. EXAM: CT ABDOMEN AND PELVIS WITH CONTRAST TECHNIQUE: Multidetector CT imaging of the abdomen and pelvis was performed using the standard protocol following bolus administration of intravenous contrast. CONTRAST:  68mL ISOVUE-300 IOPAMIDOL (ISOVUE-300) INJECTION 61% COMPARISON:  05/10/2017 . FINDINGS: Lower chest: Small right pleural effusion. Atelectasis in the lung bases, greater on the right. Moderate-sized esophageal hiatal hernia. Left breast implant. Hepatobiliary: Mildly radiopaque stones demonstrated in the gallbladder, including 1 in the gallbladder neck. The gallbladder is distended with thickened edematous wall and pericholecystic infiltration. Changes are consistent with acute cholecystitis. There is suggestion of a stone in the distal common bile duct. No significant bile duct dilatation. No focal liver lesions. Pancreas: Unremarkable. No pancreatic ductal dilatation or surrounding inflammatory changes. Spleen: Focal wedge-shaped low-attenuation lesion in the spleen, new since previous study. This may indicate a splenic infarct. Adrenals/Urinary Tract: Adrenal glands are unremarkable. Kidneys are normal, without renal calculi, focal lesion, or hydronephrosis. Bladder is unremarkable. Stomach/Bowel:  Stomach, small bowel, and colon are not abnormally distended. Scattered stool throughout the colon. Colonic diverticula without evidence of diverticulitis. The appendix is normal. Vascular/Lymphatic: Aortic atherosclerosis. No enlarged abdominal or pelvic lymph nodes. Reproductive: Status post hysterectomy. No adnexal masses. Other: No abdominal wall hernia or abnormality. No abdominopelvic ascites. Musculoskeletal: No acute or significant osseous findings. IMPRESSION: 1. Changes of acute cholecystitis with distended thick-walled gallbladder and surrounding inflammation. Cholelithiasis with stone in the gallbladder neck. Possible stone in the distal common bile duct. 2. Normal appearance of the appendix. 3. Small right pleural effusion with atelectasis in the lung bases. 4. Moderate esophageal hiatal hernia. 5. Focal wedge-shaped low-attenuation lesion in the spleen is new since prior study and may reflect a splenic infarct. 6. Aortic atherosclerosis. Electronically Signed   By: Lucienne [name] M.D.   On: 07/12/2017 01:35   Nm Hepato W/eject Fract  Result Date: 07/12/2017 CLINICAL DATA:  Right upper quadrant abdominal pain. EXAM: NUCLEAR MEDICINE HEPATOBILIARY IMAGING TECHNIQUE: Sequential images of the abdomen were obtained out to 60 minutes following intravenous administration of radiopharmaceutical. RADIOPHARMACEUTICALS:  5.4 mCi Tc-78m  Choletec IV COMPARISON:  CT scan and ultrasound of same day. FINDINGS: Prompt uptake and biliary excretion of activity by the liver is seen. No filling of gallbladder is noted, consistent with cystic duct obstruction. Biliary activity passes into small bowel, consistent with patent common bile duct. IMPRESSION: No filling of gallbladder is noted consistent with cystic duct obstruction. This is concerning for acute cholecystitis. Electronically Signed   By: Marijo Conception, M.D.   On: 07/12/2017 17:09   US Abdomen Limited  Result Date: 07/12/2017 CLINICAL DATA:  Right  upper quadrant pain for 1 week, history of breast carcinoma EXAM: ULTRASOUND ABDOMEN LIMITED RIGHT UPPER QUADRANT COMPARISON:  CT abdomen pelvis of 07/12/2016 FINDINGS: Gallbladder: The gallbladder is well visualized and there are gallstones present, the largest of 2.2 cm. However there is no pain over the gallbladder with compression. The gallbladder wall is slightly prominent at 3.6 mm. Common bile duct: Diameter: The common bile duct is within upper limits of normal measuring 7.3 cm. Evaluation of the common bile duct is somewhat limited by overlying bowel gas. Liver: The parenchyma of the liver is normal in echogenicity. No focal hepatic abnormality is seen. Portal vein is patent on color Doppler imaging with normal direction of blood flow towards the liver. IMPRESSION: 1. Gallstones. The gallbladder wall is slightly prominent but no pain is present over  the gallbladder with compression currently to indicate acute cholecystitis. 2. Minimal prominence of the common bile duct to maximum diameter of 7.3 cm. 3. No hepatic abnormality is seen. Electronically Signed   By: Ivar Drape M.D.   On: 07/12/2017 09:22    Labs:  CBC: Recent Labs    05/15/17 0444 07/11/17 2120 07/12/17 0501 07/13/17 0402  WBC 5.8 9.2 9.6 6.5  HGB 11.8* 13.3 12.1 11.4*  HCT 39.1 42.4 37.8 36.2  PLT 177 179 178 176    COAGS: Recent Labs    07/12/17 1216  INR 1.04    BMP: Recent Labs    05/13/17 0547 07/11/17 2120 07/12/17 0501 07/13/17 0402  NA 140 135 134* 139  K 4.2 4.2 4.3 3.8  CL 104 99* 99* 106  CO2 25 25 24 24   GLUCOSE 104* 113* 107* 89  BUN 15 28* 24* 18  CALCIUM 8.5* 9.0 8.2* 8.1*  CREATININE 0.94 1.25* 1.08* 0.97  GFRNONAA 59* 42* 50* 57*  GFRAA >60 49* 58* >60    LIVER FUNCTION TESTS: Recent Labs    05/10/17 1730 07/11/17 2120 07/12/17 0501 07/13/17 0402  BILITOT 0.5 0.5 1.3* 0.7  AST 23 19 27  59*  ALT 17 15 16  44  ALKPHOS 99 93 82 214*  PROT 7.2 7.1 6.5 5.8*  ALBUMIN 3.7 3.1*  2.7* 2.6*    TUMOR MARKERS: No results for input(s): AFPTM, CEA, CA199, CHROMGRNA in the last 8760 hours.  Assessment and Plan: 72 y.o. female with past medical history significant for coronary artery disease, prior CVA with residual right hemiparesis- on plavix, diabetes, COPD, GERD, left breast cancer, hyperlipidemia and depression.  She was recently admitted to [facility] on 4/24 secondary to persistent right upper quadrant pain. She was also treated nonoperatively for appendicitis in February of this year.  CT of abdomen pelvis performed yesterday revealed changes of acute cholecystitis with distended thick-walled gallbladder and surrounding inflammation.  There was cholelithiasis with stone in the gallbladder neck and possible stone in the distal common bile duct.  Also noted was small right effusion, moderate esophageal hiatal hernia, focal wedge-shaped lesion in the spleen, possibly representing splenic infarct.  HIDA scan performed yesterday revealed no filling of the gallbladder consistent with cystic duct obstruction.  Patient is a poor surgical candidate and request now received from surgery for percutaneous cholecystostomy.  Imaging studies have been reviewed by Dr. Earleen Newport. Labs reviewed.  Details/risks of procedure, including but not limited to, internal bleeding, infection, injury to adjacent structures, need for prolonged drain placement discussed with patient/husband with their understanding and consent. Procedure scheduled for later today.    Thank you for this interesting consult.  I greatly enjoyed meeting Brandy Paul and look forward to participating in their care.  A copy of this report was sent to the requesting provider on this date.  Electronically Signed: D. Rowe Robert, PA-C 07/13/2017, 9:11 AM   I spent a total of  40 minutes   in face to face in clinical consultation, greater than 50% of which was counseling/coordinating care for percutaneous  cholecystostomy

## 2017-07-14 DIAGNOSIS — N179 Acute kidney failure, unspecified: Secondary | ICD-10-CM

## 2017-07-14 LAB — GLUCOSE, CAPILLARY
GLUCOSE-CAPILLARY: 80 mg/dL (ref 65–99)
GLUCOSE-CAPILLARY: 88 mg/dL (ref 65–99)
GLUCOSE-CAPILLARY: 96 mg/dL (ref 65–99)
GLUCOSE-CAPILLARY: 146 mg/dL — AB (ref 65–99)
Glucose-Capillary: 116 mg/dL — ABNORMAL HIGH (ref 65–99)
Glucose-Capillary: 88 mg/dL (ref 65–99)

## 2017-07-14 MED ORDER — HYDROCODONE-ACETAMINOPHEN 5-325 MG PO TABS
1.0000 | ORAL_TABLET | ORAL | Status: DC | PRN
  Administered 2017-07-14 – 2017-07-15 (×4): 2 via ORAL
  Filled 2017-07-14 (×4): qty 2

## 2017-07-14 NOTE — Progress Notes (Signed)
PROGRESS NOTE    Brandy Paul  YCX:[id number] DOB: 1946/02/22 DOA: 07/11/2017 PCP: Velna Hatchet, MD    Brief Narrative:  72 year old female who presented with abdominal pain.She does have significant past medical history for coronary artery disease, stroke with right hemiparesis, expressive aphasia, breast cancer and COPD. Patient presents with right upper quadrant abdominal pain, constant for the last 4 to 7 days prior to hospitalization, no associated nausea vomiting, fevers or chills. Initial physical examination blood pressure 113/59, heart rate 76, respiratory 21, oxygen saturation 94%, lungs are clear to auscultation bilaterally, heart S1-S2 present and rhythmic, abdomen with epigastric tenderness, no guarding or rigidity, no lower extremity edema, positive right hemiparesis. CT of the abdomen with acute cholecystitis, distended thick walled gallbladder and surrounding inflammation. Cholelithiasis with stone in the gallbladder neck. Possible stone in the distal common bile duct.  Patient was admitted to hospitalwith theworking diagnosis of acute cholecystitis.   Assessment & Plan:   Principal Problem:   Abdominal pain Active Problems:   CVA, old, aphasia   DM (diabetes mellitus), type 2, uncontrolled with complications (HCC)   Hemiparesis affecting right side as late effect of cerebrovascular accident ([place])   Acute cholecystitis  1. Acute cholecystitis. Clinically improved, patient is more awake and alert, draining cholecystostomy tube, 350 ml over last 24 hours, bilious fluid. Diet advanced per surgery, continue IV analgesics and as needed antiemetics. Continue antibiotic therapy with IV Zosyn.   2. AKI. Renal function with serum cr at 0.97 with K at 3,8 and serum bicarbonate at 24. Will continue hydration with IV fluids and follow on renal panel in am, avoid nephrotoxic medications or hypotension.   3. CAD. No chest pain.   4. COPD.Continue with oxymetry  monitoring, -02 per [place] as needed, to target 02 saturation greater than 92%. .    5. T2DM. Will continue glucose cover and monitoring with insulin sliding scale. Capillary glucose 88, 96, 116.  6. Depression. Continue wellbutrin and sertraline, no confusion or agitation.  DVT prophylaxis:enoxaparin Code Status:full Family Communication:I spoke with patient's husband at the bedside and all questions were addressed.  Disposition Plan:Home when improved   Consultants:  Surgery   IR  Procedures:    Antimicrobials:  Zosyn IV   Subjective: Patient is feeling better, improved abdominal pain, no nausea or vomiting, no dyspnea or chest pain   Objective: Vitals:   07/13/17 1355 07/13/17 1816 07/13/17 2014 07/14/17 0412  BP: (!) 127/52 (!) 125/47 (!) 124/50 (!) 116/52  Pulse: 72 81 70 70  Resp:  20 16 16   Temp:   99.2 F (37.3 C) 98.6 F (37 C)  TempSrc:   Oral Oral  SpO2: 91% 91% 92% 95%  Weight:      Height:        Intake/Output Summary (Last 24 hours) at 07/14/2017 1109 Last data filed at 07/14/2017 0500 Gross per 24 hour  Intake 170 ml  Output 550 ml  Net -380 ml   Filed Weights   07/12/17 1645  Weight: 93.1 kg (205 lb 4 oz)    Examination:   General: deconditioned Neurology: Awake and alert, non focal  E ENT: mild pallor, no icterus, oral mucosa moist Cardiovascular: No JVD. S1-S2 present, rhythmic, no gallops, rubs, or murmurs. No lower extremity edema. Pulmonary: mild decreased breath sounds bilaterally at bases, no wheezing, rhonchi or rales. Gastrointestinal. Abdomen protuberant, tender to deep palpation, percutaneous drain in place with bile liquid drainage, no organomegaly, no rebound or guarding Skin. No rashes Musculoskeletal:  no joint deformities     Data Reviewed: I have personally reviewed following labs and imaging studies  CBC: Recent Labs  Lab 07/11/17 2120 07/12/17 0501 07/13/17 0402  WBC 9.2 9.6 6.5  HGB 13.3  12.1 11.4*  HCT 42.4 37.8 36.2  MCV 80.8 80.8 82.1  PLT 179 178 403   Basic Metabolic Panel: Recent Labs  Lab 07/11/17 2120 07/12/17 0501 07/13/17 0402  NA 135 134* 139  K 4.2 4.3 3.8  CL 99* 99* 106  CO2 25 24 24   GLUCOSE 113* 107* 89  BUN 28* 24* 18  CREATININE 1.25* 1.08* 0.97  CALCIUM 9.0 8.2* 8.1*   GFR: Estimated Creatinine Clearance: 59.1 mL/min (by C-G formula based on SCr of 0.97 mg/dL). Liver Function Tests: Recent Labs  Lab 07/11/17 2120 07/12/17 0501 07/13/17 0402  AST 19 27 59*  ALT 15 16 44  ALKPHOS 93 82 214*  BILITOT 0.5 1.3* 0.7  PROT 7.1 6.5 5.8*  ALBUMIN 3.1* 2.7* 2.6*   Recent Labs  Lab 07/11/17 2120  LIPASE 27   No results for input(s): AMMONIA in the last 168 hours. Coagulation Profile: Recent Labs  Lab 07/12/17 1216  INR 1.04   Cardiac Enzymes: No results for input(s): CKTOTAL, CKMB, CKMBINDEX, TROPONINI in the last 168 hours. BNP (last 3 results) No results for input(s): PROBNP in the last 8760 hours. HbA1C: No results for input(s): HGBA1C in the last 72 hours. CBG: Recent Labs  Lab 07/13/17 1719 07/13/17 2016 07/14/17 0003 07/14/17 0407 07/14/17 0757  GLUCAP 140* 93 88 80 88   Lipid Profile: No results for input(s): CHOL, HDL, LDLCALC, TRIG, CHOLHDL, LDLDIRECT in the last 72 hours. Thyroid Function Tests: No results for input(s): TSH, T4TOTAL, FREET4, T3FREE, THYROIDAB in the last 72 hours. Anemia Panel: No results for input(s): VITAMINB12, FOLATE, FERRITIN, TIBC, IRON, RETICCTPCT in the last 72 hours.    Radiology Studies: I have reviewed all of the imaging during this hospital visit personally     Scheduled Meds: . buPROPion  150 mg Oral BID  . insulin aspart  0-9 Units Subcutaneous Q4H  . sertraline  50 mg Oral QHS  . sodium chloride flush  5 mL Intracatheter Q8H  . tiotropium  18 mcg Inhalation Daily   Continuous Infusions: . piperacillin-tazobactam (ZOSYN)  IV 3.375 g (07/14/17 0929)     LOS: 2 days         Haylie Mccutcheon Gerome Apley, MD [facility] Pager [phone]

## 2017-07-14 NOTE — Progress Notes (Signed)
[facility]:  [phone] General Surgery Progress Note   LOS: 2 days  POD -     Chief Complaint: Abdominal pain  Assessment and Plan: 1.  Acute cholecystitis - perc drain - 07/13/2017  WBC - 6,500 - 07/14/2017  Zosyn - 4/25 >>>  Will advance to full liquids.  If diet tolerated, may have reg diet in AM  2.  H/o CAD with coronary artery spasm 3.  H/oCVA with residual right-sided hemiparesis- last dose Plavix ?07/11/17 at 2100  Needs to continue to be as active as possible in the hospital.  Will order PT. 4.  COPD 5.  DM 6.  GERD 7.  History of anticoagulation on Plavix  On hold   Principal Problem:   Abdominal pain Active Problems:   CVA, old, aphasia   DM (diabetes mellitus), type 2, uncontrolled with complications (HCC)   Hemiparesis affecting right side as late effect of cerebrovascular accident ([place])   Acute cholecystitis  Subjective:  Wants more than liquids to eat.  Husband at bedside.  Objective:   Vitals:   07/13/17 2014 07/14/17 0412  BP: (!) 124/50 (!) 116/52  Pulse: 70 70  Resp: 16 16  Temp: 99.2 F (37.3 C) 98.6 F (37 C)  SpO2: 92% 95%     Intake/Output from previous day:  04/26 0701 - 04/27 0700 In: 170 [P.O.:60; IV Piggyback:100] Out: 650 [Urine:300; Drains:350]  Intake/Output this shift:  No intake/output data recorded.   Physical Exam:   General: WN older WF who is alert and oriented.    HEENT: Normal. Pupils equal. .   Lungs: Clear   Musculoskeletal - right side paralyzed   Abdomen: Soft.  Drain out of RUQ - 350 cc recorded yesterday   Lab Results:    Recent Labs    07/12/17 0501 07/13/17 0402  WBC 9.6 6.5  HGB 12.1 11.4*  HCT 37.8 36.2  PLT 178 176    BMET   Recent Labs    07/12/17 0501 07/13/17 0402  NA 134* 139  K 4.3 3.8  CL 99* 106  CO2 24 24  GLUCOSE 107* 89  BUN 24* 18  CREATININE 1.08* 0.97  CALCIUM 8.2* 8.1*    PT/INR   Recent Labs    07/12/17 1216  LABPROT 13.5  INR 1.04     ABG  No results for input(s): PHART, HCO3 in the last 72 hours.  Invalid input(s): PCO2, PO2   Studies/Results:  Nm Hepato W/eject Fract  Result Date: 07/12/2017 CLINICAL DATA:  Right upper quadrant abdominal pain. EXAM: NUCLEAR MEDICINE HEPATOBILIARY IMAGING TECHNIQUE: Sequential images of the abdomen were obtained out to 60 minutes following intravenous administration of radiopharmaceutical. RADIOPHARMACEUTICALS:  5.4 mCi Tc-69m  Choletec IV COMPARISON:  CT scan and ultrasound of same day. FINDINGS: Prompt uptake and biliary excretion of activity by the liver is seen. No filling of gallbladder is noted, consistent with cystic duct obstruction. Biliary activity passes into small bowel, consistent with patent common bile duct. IMPRESSION: No filling of gallbladder is noted consistent with cystic duct obstruction. This is concerning for acute cholecystitis. Electronically Signed   By: Marijo Conception, M.D.   On: 07/12/2017 17:09   Ir Perc Cholecystostomy  Result Date: 07/13/2017 INDICATION: 72 year old female with a history of acute cholecystitis EXAM: IMAGE GUIDED PERCUTANEOUS CHOLECYSTOSTOMY MEDICATIONS: Zosyn; The antibiotic was administered within an appropriate time frame prior to the initiation of the procedure. ANESTHESIA/SEDATION: Moderate (conscious) sedation was employed during this procedure. A total of  Versed 1.0 mg and Fentanyl 100 mcg was administered intravenously. Moderate Sedation Time: 14 minutes. The patient's level of consciousness and vital signs were monitored continuously by radiology nursing throughout the procedure under my direct supervision. FLUOROSCOPY TIME:  Fluoroscopy Time: 0 minutes 42 seconds (102 mGy). COMPLICATIONS: None PROCEDURE: Informed written consent was obtained from the patient and the patient's family after a thorough discussion of the procedural risks, benefits and alternatives. All questions were addressed. Maximal Sterile Barrier Technique was utilized  including caps, mask, sterile gowns, sterile gloves, sterile drape, hand hygiene and skin antiseptic. A timeout was performed prior to the initiation of the procedure. Ultrasound survey of the right upper quadrant was performed for planning purposes. Once the patient is prepped and draped in the usual sterile fashion, the skin and subcutaneous tissues overlying the gallbladder were generously infiltrated 1% lidocaine for local anesthesia. A coaxial needle was advanced under ultrasound guidance through the skin subcutaneous tissues and a small segment of liver into the gallbladder lumen. With removal of the stylet, spontaneous dark bile drainage occurred. Using modified Seldinger technique, a 10 French drain was placed into the gallbladder fossa, with aspiration of the sample for the lab. Contrast injection confirmed position of the tube within the gallbladder lumen. Drainage catheter was attached to gravity drain with a suture retention placed. Patient tolerated the procedure well and remained hemodynamically stable throughout. No complications were encountered and no significant blood loss encountered. IMPRESSION: Status post image guided percutaneous cholecystostomy. Signed, Dulcy Fanny. Earleen Newport, DO [facility] Electronically Signed   By: Corrie Mckusick D.O.   On: 07/13/2017 15:02     Anti-infectives:   Anti-infectives (From admission, onward)   Start     Dose/Rate Route Frequency Ordered Stop   07/12/17 1000  piperacillin-tazobactam (ZOSYN) IVPB 3.375 g     3.375 g 12.5 mL/hr over 240 Minutes Intravenous Every 8 hours 07/12/17 0441     07/12/17 0215  piperacillin-tazobactam (ZOSYN) IVPB 3.375 g     3.375 g 100 mL/hr over 30 Minutes Intravenous  Once 07/12/17 0205 07/12/17 0438      Alphonsa Overall, MD, FACS Pager: [facility]: [phone] 07/14/2017

## 2017-07-14 NOTE — Progress Notes (Signed)
Referring Physician(s): Martin,M  Supervising Physician: Marybelle Killings  Patient Status:  [facility] - In-pt  Chief Complaint:  Acute Cholecysitis  HPI: Admitted to [facility] on 4/24 secondary to persistent right upper quadrant pain. HIDA + for acute cholecystitis Very high risk for Lap Chole. Perc chole done by Dr. Earleen Newport 07/13/17  Subjective:  [name] is feeling much better today. RUQ pain resolved. Asking for regular diet (tolerating clears).  Allergies: Ceclor [cefaclor]  Medications: Prior to Admission medications   Medication Sig Start Date End Date Taking? Authorizing Provider  acetaminophen (TYLENOL) 500 MG tablet Take 500 mg by mouth daily as needed for mild pain.    Yes [provider]  aspirin 81 MG tablet Take 81 mg by mouth every evening.    Yes [provider]  atorvastatin (LIPITOR) 20 MG tablet Take 20 mg by mouth at bedtime.  10/31/14  Yes [provider]  buPROPion (WELLBUTRIN SR) 150 MG 12 hr tablet Take 1 tablet by mouth daily. 05/18/17  Yes [provider]  Calcium Carbonate-Vitamin D (OSCAL 500/200 D-3 PO) Take 1 tablet by mouth every evening.    Yes [provider]  clopidogrel (PLAVIX) 75 MG tablet Take 1 tablet (75 mg total) by mouth at bedtime. 11/07/15  Yes Cross, Melissa D, NP  ezetimibe (ZETIA) 10 MG tablet Take 10 mg by mouth at bedtime.    Yes [provider]  isosorbide mononitrate (IMDUR) 60 MG 24 hr tablet TAKE 1 TABLET BY MOUTH DAILY 04/25/17  Yes Belva Crome, MD  nitroGLYCERIN (NITROSTAT) 0.4 MG SL tablet Place 0.4 mg under the tongue every 5 (five) minutes as needed for chest pain (Call 911 at 3rd dose within 15 minutes).   Yes [provider]  RABEprazole (ACIPHEX) 20 MG tablet Take 20 mg by mouth at bedtime.  10/29/14  Yes [provider]  sertraline (ZOLOFT) 25 MG tablet Take 50 mg by mouth at bedtime.  11/17/14  Yes [provider]  tiotropium (SPIRIVA  HANDIHALER) 18 MCG inhalation capsule Place 18 mcg into inhaler and inhale daily.    Yes [provider]     Vital Signs: BP (!) 116/52 (BP Location: Left Arm)   Pulse 70   Temp 98.6 F (37 C) (Oral)   Resp 16   Ht 5\' 5"  (1.651 m)   Wt 205 lb 4 oz (93.1 kg)   SpO2 95%   BMI 34.16 kg/m   Physical Exam Awake and alert Heart RRR Respiratory - effort normal, no distress RUQ with drain in place Gravity bag with cloudy bilious drainage ~350 mL output  Imaging: Ct Abdomen Pelvis W Contrast  Result Date: 07/12/2017 CLINICAL DATA:  Right-sided abdominal pain. Previously seen in February with inflamed appendix. EXAM: CT ABDOMEN AND PELVIS WITH CONTRAST TECHNIQUE: Multidetector CT imaging of the abdomen and pelvis was performed using the standard protocol following bolus administration of intravenous contrast. CONTRAST:  23mL ISOVUE-300 IOPAMIDOL (ISOVUE-300) INJECTION 61% COMPARISON:  05/10/2017 . FINDINGS: Lower chest: Small right pleural effusion. Atelectasis in the lung bases, greater on the right. Moderate-sized esophageal hiatal hernia. Left breast implant. Hepatobiliary: Mildly radiopaque stones demonstrated in the gallbladder, including 1 in the gallbladder neck. The gallbladder is distended with thickened edematous wall and pericholecystic infiltration. Changes are consistent with acute cholecystitis. There is suggestion of a stone in the distal common bile duct. No significant bile duct dilatation. No focal liver lesions. Pancreas: Unremarkable. No pancreatic ductal dilatation or surrounding inflammatory changes.  Spleen: Focal wedge-shaped low-attenuation lesion in the spleen, new since previous study. This may indicate a splenic infarct. Adrenals/Urinary Tract: Adrenal glands are unremarkable. Kidneys are normal, without renal calculi, focal lesion, or hydronephrosis. Bladder is unremarkable. Stomach/Bowel: Stomach, small bowel, and colon are not abnormally distended. Scattered  stool throughout the colon. Colonic diverticula without evidence of diverticulitis. The appendix is normal. Vascular/Lymphatic: Aortic atherosclerosis. No enlarged abdominal or pelvic lymph nodes. Reproductive: Status post hysterectomy. No adnexal masses. Other: No abdominal wall hernia or abnormality. No abdominopelvic ascites. Musculoskeletal: No acute or significant osseous findings. IMPRESSION: 1. Changes of acute cholecystitis with distended thick-walled gallbladder and surrounding inflammation. Cholelithiasis with stone in the gallbladder neck. Possible stone in the distal common bile duct. 2. Normal appearance of the appendix. 3. Small right pleural effusion with atelectasis in the lung bases. 4. Moderate esophageal hiatal hernia. 5. Focal wedge-shaped low-attenuation lesion in the spleen is new since prior study and may reflect a splenic infarct. 6. Aortic atherosclerosis. Electronically Signed   By: Lucienne [name] M.D.   On: 07/12/2017 01:35   Nm Hepato W/eject Fract  Result Date: 07/12/2017 CLINICAL DATA:  Right upper quadrant abdominal pain. EXAM: NUCLEAR MEDICINE HEPATOBILIARY IMAGING TECHNIQUE: Sequential images of the abdomen were obtained out to 60 minutes following intravenous administration of radiopharmaceutical. RADIOPHARMACEUTICALS:  5.4 mCi Tc-22m  Choletec IV COMPARISON:  CT scan and ultrasound of same day. FINDINGS: Prompt uptake and biliary excretion of activity by the liver is seen. No filling of gallbladder is noted, consistent with cystic duct obstruction. Biliary activity passes into small bowel, consistent with patent common bile duct. IMPRESSION: No filling of gallbladder is noted consistent with cystic duct obstruction. This is concerning for acute cholecystitis. Electronically Signed   By: Marijo Conception, M.D.   On: 07/12/2017 17:09   US Abdomen Limited  Result Date: 07/12/2017 CLINICAL DATA:  Right upper quadrant pain for 1 week, history of breast carcinoma EXAM:  ULTRASOUND ABDOMEN LIMITED RIGHT UPPER QUADRANT COMPARISON:  CT abdomen pelvis of 07/12/2016 FINDINGS: Gallbladder: The gallbladder is well visualized and there are gallstones present, the largest of 2.2 cm. However there is no pain over the gallbladder with compression. The gallbladder wall is slightly prominent at 3.6 mm. Common bile duct: Diameter: The common bile duct is within upper limits of normal measuring 7.3 cm. Evaluation of the common bile duct is somewhat limited by overlying bowel gas. Liver: The parenchyma of the liver is normal in echogenicity. No focal hepatic abnormality is seen. Portal vein is patent on color Doppler imaging with normal direction of blood flow towards the liver. IMPRESSION: 1. Gallstones. The gallbladder wall is slightly prominent but no pain is present over the gallbladder with compression currently to indicate acute cholecystitis. 2. Minimal prominence of the common bile duct to maximum diameter of 7.3 cm. 3. No hepatic abnormality is seen. Electronically Signed   By: Ivar Drape M.D.   On: 07/12/2017 09:22   Ir Perc Cholecystostomy  Result Date: 07/13/2017 INDICATION: 72 year old female with a history of acute cholecystitis EXAM: IMAGE GUIDED PERCUTANEOUS CHOLECYSTOSTOMY MEDICATIONS: Zosyn; The antibiotic was administered within an appropriate time frame prior to the initiation of the procedure. ANESTHESIA/SEDATION: Moderate (conscious) sedation was employed during this procedure. A total of Versed 1.0 mg and Fentanyl 100 mcg was administered intravenously. Moderate Sedation Time: 14 minutes. The patient's level of consciousness and vital signs were monitored continuously by radiology nursing throughout the procedure under my direct supervision. FLUOROSCOPY TIME:  Fluoroscopy Time: 0 minutes  42 seconds (102 mGy). COMPLICATIONS: None PROCEDURE: Informed written consent was obtained from the patient and the patient's family after a thorough discussion of the procedural risks,  benefits and alternatives. All questions were addressed. Maximal Sterile Barrier Technique was utilized including caps, mask, sterile gowns, sterile gloves, sterile drape, hand hygiene and skin antiseptic. A timeout was performed prior to the initiation of the procedure. Ultrasound survey of the right upper quadrant was performed for planning purposes. Once the patient is prepped and draped in the usual sterile fashion, the skin and subcutaneous tissues overlying the gallbladder were generously infiltrated 1% lidocaine for local anesthesia. A coaxial needle was advanced under ultrasound guidance through the skin subcutaneous tissues and a small segment of liver into the gallbladder lumen. With removal of the stylet, spontaneous dark bile drainage occurred. Using modified Seldinger technique, a 10 French drain was placed into the gallbladder fossa, with aspiration of the sample for the lab. Contrast injection confirmed position of the tube within the gallbladder lumen. Drainage catheter was attached to gravity drain with a suture retention placed. Patient tolerated the procedure well and remained hemodynamically stable throughout. No complications were encountered and no significant blood loss encountered. IMPRESSION: Status post image guided percutaneous cholecystostomy. Signed, Dulcy Fanny. Earleen Newport, DO [facility] Electronically Signed   By: Corrie Mckusick D.O.   On: 07/13/2017 15:02    Labs:  CBC: Recent Labs    05/15/17 0444 07/11/17 2120 07/12/17 0501 07/13/17 0402  WBC 5.8 9.2 9.6 6.5  HGB 11.8* 13.3 12.1 11.4*  HCT 39.1 42.4 37.8 36.2  PLT 177 179 178 176    COAGS: Recent Labs    07/12/17 1216  INR 1.04    BMP: Recent Labs    05/13/17 0547 07/11/17 2120 07/12/17 0501 07/13/17 0402  NA 140 135 134* 139  K 4.2 4.2 4.3 3.8  CL 104 99* 99* 106  CO2 25 25 24 24   GLUCOSE 104* 113* 107* 89  BUN 15 28* 24* 18  CALCIUM 8.5* 9.0  8.2* 8.1*  CREATININE 0.94 1.25* 1.08* 0.97  GFRNONAA 59* 42* 50* 57*  GFRAA >60 49* 58* >60    LIVER FUNCTION TESTS: Recent Labs    05/10/17 1730 07/11/17 2120 07/12/17 0501 07/13/17 0402  BILITOT 0.5 0.5 1.3* 0.7  AST 23 19 27  59*  ALT 17 15 16  44  ALKPHOS 99 93 82 214*  PROT 7.2 7.1 6.5 5.8*  ALBUMIN 3.7 3.1* 2.7* 2.6*    Assessment and Plan:  S/P percutaneous cholecystostomy by Dr. Earleen Newport 07/13/2017.  Routine care.  Ms. Alinda Money wishes to avoid surgery given her 10% risk of major cardiac event per Cardiology.  Recommend f/u at our outpatient clinic in 6 weeks with drain injection to evaluate for cystic duct patency. If the duct is patent, could proceed with a capping trial.   Electronically Signed: Murrell Redden, PA-C 07/14/2017, 9:01 AM    I spent a total of 15 Minutes at the the patient's bedside AND on the patient's hospital floor or unit, greater than 50% of which was counseling/coordinating care for f/u perc chole.

## 2017-07-15 LAB — CBC WITH DIFFERENTIAL/PLATELET
Basophils Absolute: 0 10*3/uL (ref 0.0–0.1)
Basophils Relative: 0 %
EOS PCT: 7 %
Eosinophils Absolute: 0.4 10*3/uL (ref 0.0–0.7)
HEMATOCRIT: 37.9 % (ref 36.0–46.0)
Hemoglobin: 11.6 g/dL — ABNORMAL LOW (ref 12.0–15.0)
LYMPHS ABS: 1.3 10*3/uL (ref 0.7–4.0)
LYMPHS PCT: 27 %
MCH: 25.6 pg — AB (ref 26.0–34.0)
MCHC: 30.6 g/dL (ref 30.0–36.0)
MCV: 83.7 fL (ref 78.0–100.0)
MONO ABS: 0.7 10*3/uL (ref 0.1–1.0)
MONOS PCT: 14 %
NEUTROS ABS: 2.5 10*3/uL (ref 1.7–7.7)
Neutrophils Relative %: 52 %
PLATELETS: 183 10*3/uL (ref 150–400)
RBC: 4.53 MIL/uL (ref 3.87–5.11)
RDW: 19.2 % — AB (ref 11.5–15.5)
WBC: 4.8 10*3/uL (ref 4.0–10.5)

## 2017-07-15 LAB — BASIC METABOLIC PANEL
ANION GAP: 11 (ref 5–15)
BUN: 12 mg/dL (ref 6–20)
CALCIUM: 8.5 mg/dL — AB (ref 8.9–10.3)
CO2: 24 mmol/L (ref 22–32)
Chloride: 105 mmol/L (ref 101–111)
Creatinine, Ser: 0.88 mg/dL (ref 0.44–1.00)
GFR calc Af Amer: >60 mL/min (ref >60)
Glucose, Bld: 99 mg/dL (ref 65–99)
Potassium: 3.8 mmol/L (ref 3.5–5.1)
Sodium: 140 mmol/L (ref 135–145)

## 2017-07-15 LAB — GLUCOSE, CAPILLARY
GLUCOSE-CAPILLARY: 105 mg/dL — AB (ref 65–99)
Glucose-Capillary: 103 mg/dL — ABNORMAL HIGH (ref 65–99)
Glucose-Capillary: 109 mg/dL — ABNORMAL HIGH (ref 65–99)
Glucose-Capillary: 117 mg/dL — ABNORMAL HIGH (ref 65–99)
Glucose-Capillary: 130 mg/dL — ABNORMAL HIGH (ref 65–99)
Glucose-Capillary: 136 mg/dL — ABNORMAL HIGH (ref 65–99)

## 2017-07-15 NOTE — Progress Notes (Signed)
PROGRESS NOTE    Brandy Paul  XMI:[id number] DOB: 10/19/1945 DOA: 07/11/2017 PCP: Velna Hatchet, MD    Brief Narrative:  72 year old female who presented with abdominal pain.She does have significant past medical history for coronary artery disease, stroke with right hemiparesis, expressive aphasia, breast cancer and COPD. Patient presents with right upper quadrant abdominal pain, constant for the last 4 to 7 days prior to hospitalization, no associated nausea vomiting, fevers or chills. Initial physical examination blood pressure 113/59, heart rate 76, respiratory 21, oxygen saturation 94%, lungs are clear to auscultation bilaterally, heart S1-S2 present and rhythmic, abdomen with epigastric tenderness, no guarding or rigidity, no lower extremity edema, positive right hemiparesis.  Sodium 135, potassium 4.2, chloride 99, bicarb 25, glucose 113, BUN 28, creatinine 1.25, white count 9.2, hemoglobin 13.3, hematocrit 42.4, platelets 179, urine analysis 30 protein, specific gravity 1.025, 0-5 white cells, 6-10 RBCs, positive hyaline casts.  CT of the abdomen with acute cholecystitis, distended thick walled gallbladder and surrounding inflammation. [place] with cholelithiasis with stone in the gallbladder neck, possible stone in the distal common bile duct.  Patient was admitted to hospitalwith theworking diagnosis of acute cholecystitis   Assessment & Plan:   Principal Problem:   Abdominal pain Active Problems:   CVA, old, aphasia   DM (diabetes mellitus), type 2, uncontrolled with complications ([place])   Hemiparesis affecting right side as late effect of cerebrovascular accident ([place])   Acute cholecystitis  1. Acute cholecystitis.Cholecystostomy tube drain 326 ml over last 24 hours, bilious fluid. Continue to advanced diet per surgery to regular, Antibiotic therapy with IV Zosyn. # 4.   2. AKI.Preserved renal function, serum cr at 0,88 with K at 3,8, diet continue to be advanced, will  hold on further IV fluids.   3. CAD. Stable with no chest pain.   4. COPD.Oxymetry monitoring, and 02 per [place] as needed. No signs of exacerbation.  5. T2DM.Glucose cover and monitoring with insulin sliding scale.Capillary glucose 146, 136, 109, 105, 103. Diet advanced to regular.   6. Depression.Onwellbutrin and sertraline, tolerating well.  DVT prophylaxis:enoxaparin Code Status:full Family Communication:no family at the bedside  Disposition Plan:Home in 24 to 48 hours, depending on surgery recommendation.    Consultants:  Surgery   IR  Procedures:    Antimicrobials:  Zosyn IV  Subjective: Patient continue to improve, diet has been advanced this am with good toleration, no abdominal pain, no nausea or vomiting, no chest pain or dyspnea.   Objective: Vitals:   07/14/17 1324 07/14/17 2001 07/15/17 0407 07/15/17 0839  BP: (!) 112/51 (!) 112/49 (!) 127/53   Pulse: 73 73 64   Resp: 20 17 14    Temp: 98.4 F (36.9 C) 98.5 F (36.9 C) 98.8 F (37.1 C)   TempSrc: Oral Oral    SpO2: 96% 93% 91% 90%  Weight:      Height:        Intake/Output Summary (Last 24 hours) at 07/15/2017 1058 Last data filed at 07/15/2017 0937 Gross per 24 hour  Intake 580 ml  Output 675 ml  Net -95 ml   Filed Weights   07/12/17 1645  Weight: 93.1 kg (205 lb 4 oz)    Examination:   General: Not in pain or dyspnea. Deconditioned  Neurology: Awake and alert, non focal  E ENT: mild pallor, no icterus, oral mucosa moist Cardiovascular: No JVD. S1-S2 present, rhythmic, no gallops, rubs, or murmurs. Trace non pitting lower extremity edema. Pulmonary: vesicular breath sounds bilaterally, adequate air movement,  no wheezing, rhonchi or rales. Gastrointestinal. Abdomen protuberant no organomegaly, non tender, no rebound or guarding. Percutaneous drain at the right upper quadrant.  Skin. No rashes Musculoskeletal: no joint deformities     Data Reviewed: I have  personally reviewed following labs and imaging studies  CBC: Recent Labs  Lab 07/11/17 2120 07/12/17 0501 07/13/17 0402 07/15/17 0430  WBC 9.2 9.6 6.5 4.8  NEUTROABS  --   --   --  2.5  HGB 13.3 12.1 11.4* 11.6*  HCT 42.4 37.8 36.2 37.9  MCV 80.8 80.8 82.1 83.7  PLT 179 178 176 846   Basic Metabolic Panel: Recent Labs  Lab 07/11/17 2120 07/12/17 0501 07/13/17 0402 07/15/17 0430  NA 135 134* 139 140  K 4.2 4.3 3.8 3.8  CL 99* 99* 106 105  CO2 25 24 24 24   GLUCOSE 113* 107* 89 99  BUN 28* 24* 18 12  CREATININE 1.25* 1.08* 0.97 0.88  CALCIUM 9.0 8.2* 8.1* 8.5*   GFR: Estimated Creatinine Clearance: 65.1 mL/min (by C-G formula based on SCr of 0.88 mg/dL). Liver Function Tests: Recent Labs  Lab 07/11/17 2120 07/12/17 0501 07/13/17 0402  AST 19 27 59*  ALT 15 16 44  ALKPHOS 93 82 214*  BILITOT 0.5 1.3* 0.7  PROT 7.1 6.5 5.8*  ALBUMIN 3.1* 2.7* 2.6*   Recent Labs  Lab 07/11/17 2120  LIPASE 27   No results for input(s): AMMONIA in the last 168 hours. Coagulation Profile: Recent Labs  Lab 07/12/17 1216  INR 1.04   Cardiac Enzymes: No results for input(s): CKTOTAL, CKMB, CKMBINDEX, TROPONINI in the last 168 hours. BNP (last 3 results) No results for input(s): PROBNP in the last 8760 hours. HbA1C: No results for input(s): HGBA1C in the last 72 hours. CBG: Recent Labs  Lab 07/14/17 1636 07/14/17 2003 07/15/17 0003 07/15/17 0409 07/15/17 0759  GLUCAP 116* 146* 136* 109* 105*   Lipid Profile: No results for input(s): CHOL, HDL, LDLCALC, TRIG, CHOLHDL, LDLDIRECT in the last 72 hours. Thyroid Function Tests: No results for input(s): TSH, T4TOTAL, FREET4, T3FREE, THYROIDAB in the last 72 hours. Anemia Panel: No results for input(s): VITAMINB12, FOLATE, FERRITIN, TIBC, IRON, RETICCTPCT in the last 72 hours.    Radiology Studies: I have reviewed all of the imaging during this hospital visit personally     Scheduled Meds: . buPROPion  150 mg Oral  BID  . insulin aspart  0-9 Units Subcutaneous Q4H  . sertraline  50 mg Oral QHS  . sodium chloride flush  5 mL Intracatheter Q8H  . tiotropium  18 mcg Inhalation Daily   Continuous Infusions: . piperacillin-tazobactam (ZOSYN)  IV 3.375 g (07/15/17 0937)     LOS: 3 days        Treanna Dumler Gerome Apley, MD [facility] Pager [phone]

## 2017-07-15 NOTE — Progress Notes (Signed)
[facility]:  [phone] General Surgery Progress Note   LOS: 3 days  POD -     Chief Complaint: Abdominal pain  Assessment and Plan: 1.  Acute cholecystitis - perc drain - 07/13/2017  WBC - 4,800 - 07/15/2017  Zosyn - 4/25 >>>  Will advance to reg diet.    2.  H/o CAD with coronary artery spasm 3.  H/oCVA with residual right-sided hemiparesis- last dose Plavix ?07/11/17 at 2100  Needs to continue to be as active as possible in the hospital.  Will order PT. 4.  COPD 5.  DM 6.  GERD 7.  History of anticoagulation on Plavix  On hold   Principal Problem:   Abdominal pain Active Problems:   CVA, old, aphasia   DM (diabetes mellitus), type 2, uncontrolled with complications (HCC)   Hemiparesis affecting right side as late effect of cerebrovascular accident ([place])   Acute cholecystitis  Subjective:  Tolerated full liquids.  Minimal abdominal pain.  Objective:   Vitals:   07/15/17 0407 07/15/17 0839  BP: (!) 127/53   Pulse: 64   Resp: 14   Temp: 98.8 F (37.1 C)   SpO2: 91% 90%     Intake/Output from previous day:  04/27 0701 - 04/28 0700 In: 700 [P.O.:480; I.V.:5; IV Piggyback:200] Out: 725 [Urine:400; Drains:325]  Intake/Output this shift:  No intake/output data recorded.   Physical Exam:   General: WN older WF who is alert and oriented.    HEENT: Normal. Pupils equal. .   Lungs: Clear   Musculoskeletal - Right side paralyzed   Abdomen: Soft.  Drain out of RUQ - 325 cc recorded yesterday   Lab Results:    Recent Labs    07/13/17 0402 07/15/17 0430  WBC 6.5 4.8  HGB 11.4* 11.6*  HCT 36.2 37.9  PLT 176 183    BMET   Recent Labs    07/13/17 0402 07/15/17 0430  NA 139 140  K 3.8 3.8  CL 106 105  CO2 24 24  GLUCOSE 89 99  BUN 18 12  CREATININE 0.97 0.88  CALCIUM 8.1* 8.5*    PT/INR   Recent Labs    07/12/17 1216  LABPROT 13.5  INR 1.04    ABG  No results for input(s): PHART, HCO3 in the last 72  hours.  Invalid input(s): PCO2, PO2   Studies/Results:  Ir Perc Cholecystostomy  Result Date: 07/13/2017 INDICATION: 72 year old female with a history of acute cholecystitis EXAM: IMAGE GUIDED PERCUTANEOUS CHOLECYSTOSTOMY MEDICATIONS: Zosyn; The antibiotic was administered within an appropriate time frame prior to the initiation of the procedure. ANESTHESIA/SEDATION: Moderate (conscious) sedation was employed during this procedure. A total of Versed 1.0 mg and Fentanyl 100 mcg was administered intravenously. Moderate Sedation Time: 14 minutes. The patient's level of consciousness and vital signs were monitored continuously by radiology nursing throughout the procedure under my direct supervision. FLUOROSCOPY TIME:  Fluoroscopy Time: 0 minutes 42 seconds (102 mGy). COMPLICATIONS: None PROCEDURE: Informed written consent was obtained from the patient and the patient's family after a thorough discussion of the procedural risks, benefits and alternatives. All questions were addressed. Maximal Sterile Barrier Technique was utilized including caps, mask, sterile gowns, sterile gloves, sterile drape, hand hygiene and skin antiseptic. A timeout was performed prior to the initiation of the procedure. Ultrasound survey of the right upper quadrant was performed for planning purposes. Once the patient is prepped and draped in the usual sterile fashion, the skin and subcutaneous tissues overlying the gallbladder  were generously infiltrated 1% lidocaine for local anesthesia. A coaxial needle was advanced under ultrasound guidance through the skin subcutaneous tissues and a small segment of liver into the gallbladder lumen. With removal of the stylet, spontaneous dark bile drainage occurred. Using modified Seldinger technique, a 10 French drain was placed into the gallbladder fossa, with aspiration of the sample for the lab. Contrast injection confirmed position of the tube within the gallbladder lumen. Drainage catheter  was attached to gravity drain with a suture retention placed. Patient tolerated the procedure well and remained hemodynamically stable throughout. No complications were encountered and no significant blood loss encountered. IMPRESSION: Status post image guided percutaneous cholecystostomy. Signed, Dulcy Fanny. Earleen Newport, DO [facility] Electronically Signed   By: Corrie Mckusick D.O.   On: 07/13/2017 15:02     Anti-infectives:   Anti-infectives (From admission, onward)   Start     Dose/Rate Route Frequency Ordered Stop   07/12/17 1000  piperacillin-tazobactam (ZOSYN) IVPB 3.375 g     3.375 g 12.5 mL/hr over 240 Minutes Intravenous Every 8 hours 07/12/17 0441     07/12/17 0215  piperacillin-tazobactam (ZOSYN) IVPB 3.375 g     3.375 g 100 mL/hr over 30 Minutes Intravenous  Once 07/12/17 0205 07/12/17 0438      Alphonsa Overall, MD, FACS Pager: [facility]: [phone] 07/15/2017

## 2017-07-15 NOTE — Evaluation (Signed)
Physical Therapy Evaluation Patient Details Name: Brandy Paul MRN: [id number] DOB: [birth year] Today's Date: 07/15/2017   History of Present Illness  Brandy Paul is a 72 y.o. 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  Clinical Impression  Patient presents with decreased mobility due to weakness and pain from drain placement.  She currently needs min to mod A for short distance mobility in the room.  Feel should be able to d/c home with spouse assist and follow up HHPT.  Needs continued mobilization with nursing in the room.  PT to follow.    Follow Up Recommendations Home health PT;Supervision/Assistance - 24 hour    Equipment Recommendations  None recommended by PT    Recommendations for Other Services       Precautions / Restrictions Precautions Precautions: Fall      Mobility  Bed Mobility Overal bed mobility: Needs Assistance Bed Mobility: Supine to Sit     Supine to sit: Mod assist     General bed mobility comments: assist with R LE off bed and to lift trunk  Transfers Overall transfer level: Needs assistance Equipment used: Quad cane Transfers: Sit to/from Stand Sit to Stand: Mod assist         General transfer comment: some lifting help  Ambulation/Gait   Ambulation Distance (Feet): 20 Feet(x 2) Assistive device: Quad cane Gait Pattern/deviations: Step-to pattern;Decreased stride length   Gait velocity interpretation: <1.31 ft/sec, indicative of household ambulator General Gait Details: self limited to in room ambulation  Stairs            Wheelchair Mobility    Modified Rankin (Stroke Patients Only)       Balance Overall balance assessment: Needs assistance   Sitting balance-Leahy Scale: Good       Standing balance-Leahy Scale: Poor Standing balance comment:  static balance initially with A                             Pertinent Vitals/Pain Pain Assessment: Faces Faces Pain Scale: Hurts even more Pain Location: L side with sit <>stand    Home Living Family/patient expects to be discharged to:: Private residence Living Arrangements: Spouse/significant other Available Help at Discharge: Family Type of Home: House Home Access: Level entry     Home Layout: One level Home Equipment: Other (comment);Cane - quad;Wheelchair - [employer] - 2 wheels      Prior Function Level of Independence: Needs assistance   Gait / Transfers Assistance Needed: uses a quad  ADL's / Homemaking Assistance Needed: Husband A with all ADL activity         Hand Dominance   Dominant Hand: Right    Extremity/Trunk Assessment   Upper Extremity Assessment Upper Extremity Assessment: RUE deficits/detail RUE Deficits / Details: R hemiparesis, does not use functionally    Lower Extremity Assessment Lower Extremity Assessment: RLE deficits/detail RLE Deficits / Details: increased extensor tone throughout, places foot in everesion and PF during gait    Cervical / Trunk Assessment Cervical / Trunk Assessment: Other exceptions Cervical / Trunk Exceptions: R hemipelvic retraction  Communication   Communication: Expressive difficulties(slow with responses with single to triple words)  Cognition Arousal/Alertness: Awake/alert Behavior During Therapy: WFL for tasks assessed/performed Overall Cognitive Status: History of cognitive impairments - at baseline  General Comments      Exercises     Assessment/Plan    PT Assessment Patient needs continued PT services  PT Problem List Decreased strength;Decreased mobility;Decreased balance;Decreased activity tolerance       PT Treatment Interventions DME instruction;Therapeutic activities;Gait training;Therapeutic exercise;Patient/family  education;Balance training;Functional mobility training    PT Goals (Current goals can be found in the Care Plan section)  Acute Rehab PT Goals Patient Stated Goal: to go home PT Goal Formulation: With patient Time For Goal Achievement: 07/29/17 Potential to Achieve Goals: Good    Frequency Min 3X/week   Barriers to discharge        Co-evaluation               AM-PAC PT "6 Clicks" Daily Activity  Outcome Measure Difficulty turning over in bed (including adjusting bedclothes, sheets and blankets)?: Unable Difficulty moving from lying on back to sitting on the side of the bed? : Unable Difficulty sitting down on and standing up from a chair with arms (e.g., wheelchair, bedside commode, etc,.)?: Unable Help needed moving to and from a bed to chair (including a wheelchair)?: A Little Help needed walking in hospital room?: A Little Help needed climbing 3-5 steps with a railing? : A Lot 6 Click Score: 11    End of Session Equipment Utilized During Treatment: Gait belt Activity Tolerance: Patient limited by fatigue Patient left: in chair;with chair alarm set;with call bell/phone within reach Nurse Communication: Mobility status PT Visit Diagnosis: Other abnormalities of gait and mobility (R26.89);Muscle weakness (generalized) (M62.81)    Time: 9166-0600 PT Time Calculation (min) (ACUTE ONLY): 28 min   Charges:   PT Evaluation $PT Eval Moderate Complexity: 1 Mod PT Treatments $Gait Training: 8-22 mins   PT G CodesMagda Kiel, [place] [phone] 07/15/2017   Reginia Naas 07/15/2017, 3:45 PM

## 2017-07-16 LAB — GLUCOSE, CAPILLARY
GLUCOSE-CAPILLARY: 111 mg/dL — AB (ref 65–99)
GLUCOSE-CAPILLARY: 112 mg/dL — AB (ref 65–99)
GLUCOSE-CAPILLARY: 120 mg/dL — AB (ref 65–99)
GLUCOSE-CAPILLARY: 96 mg/dL (ref 65–99)

## 2017-07-16 MED ORDER — AMOXICILLIN-POT CLAVULANATE 875-125 MG PO TABS: 1.0000 | ORAL_TABLET | Freq: Two times a day (BID) | ORAL | 0 refills | Status: AC

## 2017-07-16 MED ORDER — AMOXICILLIN-POT CLAVULANATE 875-125 MG PO TABS: 1.0000 | ORAL_TABLET | Freq: Two times a day (BID) | ORAL | Status: DC

## 2017-07-16 NOTE — Discharge Summary (Addendum)
Physician Discharge Summary  Brandy Paul HCW:[id number] DOB: 01-29-1946 DOA: 07/11/2017  PCP: Velna Hatchet, MD  Admit date: 07/11/2017 Discharge date: 07/16/2017  Admitted From: Home Disposition:  Home  Recommendations for Outpatient Follow-up and new medication changes:  1. Follow up with PCP in 1- week 2. Will continue antibiotic therapy with Augmentin for 7 more days 3. Follow up with IR outpatient clinic in 6 weeks with a drain injection to evaluate cystic duct patency. If the duct is patent could proceed with a capping trial. 4. Follow up with Dr. Hassell Done from surgery in 3 weeks.  5. Outpatient: once daily flush of drain with 5-10 cc sterile NS, output recording and dressing changes as needed   Home Health: home  Equipment/Devices: Biliary drain    Discharge Condition: home CODE STATUS: full   Diet recommendation: Heart healthy and diabetic prudent.   Brief/Interim Summary: 72 year old female who presented with abdominal pain.She does have the significant past medical history for coronary artery disease, stroke with right hemiparesis, expressive aphasia, breast cancer and COPD. Patient presents with right upper quadrant abdominal pain, constant for the last 4 to 7 days prior to hospitalization, no associated nausea, vomiting, fevers or chills. Initial physical examination blood pressure 113/59, heart rate 76, respiratory rate 21, oxygen saturation 94%, lungs were clear to auscultation bilaterally, heart S1-S2 present and rhythmic, abdomen with epigastric tenderness, no guarding or rigidity, no lower extremity edema, positive right hemiparesis.  Sodium 135, potassium 4.2, chloride 99, bicarb 25, glucose 113, BUN 28, creatinine 1.25, white count 9.2, hemoglobin 13.3, hematocrit 42.4, platelets 179, urine analysis 30 protein, specific gravity 1.025, 0-5 white cells, 6-10 RBCs, positive hyaline casts.  CT of the abdomen with acute cholecystitis, distended thick walled gallbladder and  surrounding inflammation. [place] with cholelithiasis with stone in the gallbladder neck, possible stone in the distal common bile duct.  Patient was admitted to hospitalwith theworking diagnosis of acute cholecystitis related to gallstones.   1.  Acute cholecystitis.  Patient was admitted to the medical ward, she was placed on a remote telemetry monitor, received IV antibiotic therapy with Zosyn.  IV fluids, IV analgesics, IV antiacids and acid antiemetics.  She has a high cardiovascular risk for surgery, then she underwent percutaneous cholecystostomy tube by interventional radiology on April 26.  She had significant improvement in her symptoms, her diet was advanced progressively with good toleration.  Improved abdominal pain.  Patient remained afebrile.  Antibiotic was transitioned to oral Augmentin that she will continue for 7 more days.  Will need follow-up as an outpatient with interventional radiology in 6 weeks, general surgery in 3 weeks.  Home health was arranged to continue care of the cholecystostomy tube.  Patient was seen by physical therapy, recommendations for outpatient home health.  2.  Acute kidney injury.  Patient received IV fluids with improvement of kidney function and electrolytes.  Patient tolerating diet adequately at discharge.  3.  Type 2 diabetes mellitus.  Patient was placed on insulin sliding scale for glucose coverage and monitoring, capillary glucose remained well controlled.  Patient will resume diet controlled discharge.  4.  COPD.  No exacerbation, patient received as needed supplemental oxygen per nasal cannula.  Continue tiotropium at home.  5.  Coronary artery disease.  Patient remains chest pain-free, clopidogrel was held during hospitalization.  She will resume dual antiplatelet therapy at discharge.  Continue atorvastatin and ezetimibe.   6.  Depression.  Continue Wellbutrin and sertraline.  7.  History of CVA with right hemiparesis.  Patient received DVT  prophylaxis, physical therapy evaluation, continue home health services.  Continue aspirin and clopidogrel.  8.  Hypertension.  Blood pressure remained well controlled, continue isosorbide at discharge.  Discharge Diagnoses:  Principal Problem:   Abdominal pain Active Problems:   CVA, old, aphasia   DM (diabetes mellitus), type 2, uncontrolled with complications ([place])   Hemiparesis affecting right side as late effect of cerebrovascular accident [facility])   Acute cholecystitis    Discharge Instructions   Allergies as of 07/16/2017      Reactions   Ceclor [cefaclor] Other (See Comments)   REACTION: "SEVERE HEADACHE"      Medication List    TAKE these medications   acetaminophen 500 MG tablet Commonly known as:  TYLENOL Take 500 mg by mouth daily as needed for mild pain.   amoxicillin-clavulanate 875-125 MG tablet Commonly known as:  AUGMENTIN Take 1 tablet by mouth every 12 (twelve) hours for 7 days.   aspirin 81 MG tablet Take 81 mg by mouth every evening.   atorvastatin 20 MG tablet Commonly known as:  LIPITOR Take 20 mg by mouth at bedtime.   buPROPion 150 MG 12 hr tablet Commonly known as:  WELLBUTRIN SR Take 1 tablet by mouth daily.   clopidogrel 75 MG tablet Commonly known as:  PLAVIX Take 1 tablet (75 mg total) by mouth at bedtime.   ezetimibe 10 MG tablet Commonly known as:  ZETIA Take 10 mg by mouth at bedtime.   isosorbide mononitrate 60 MG 24 hr tablet Commonly known as:  IMDUR TAKE 1 TABLET BY MOUTH DAILY   nitroGLYCERIN 0.4 MG SL tablet Commonly known as:  NITROSTAT Place 0.4 mg under the tongue every 5 (five) minutes as needed for chest pain (Call 911 at 3rd dose within 15 minutes).   OSCAL 500/200 D-3 PO Take 1 tablet by mouth every evening.   RABEprazole 20 MG tablet Commonly known as:  ACIPHEX Take 20 mg by mouth at bedtime.   sertraline 25 MG tablet Commonly known as:  ZOLOFT Take 50 mg by mouth at bedtime.   SPIRIVA HANDIHALER 18  MCG inhalation capsule Generic drug:  tiotropium Place 18 mcg into inhaler and inhale daily.      Follow-up Information    Johnathan Hausen, MD. Call in 3 week(s).   Specialty:  General Surgery Why:  call to arranage follow up regarding your gallbladder Contact information: [address] [place] [address] [phone]          Allergies  Allergen Reactions  . Ceclor [Cefaclor] Other (See Comments)    REACTION: "SEVERE HEADACHE"    Consultations:  Surgery   Interventional Radiology    Procedures/Studies: Ct Abdomen Pelvis W Contrast  Result Date: 07/12/2017 CLINICAL DATA:  Right-sided abdominal pain. Previously seen in February with inflamed appendix. EXAM: CT ABDOMEN AND PELVIS WITH CONTRAST TECHNIQUE: Multidetector CT imaging of the abdomen and pelvis was performed using the standard protocol following bolus administration of intravenous contrast. CONTRAST:  71mL ISOVUE-300 IOPAMIDOL (ISOVUE-300) INJECTION 61% COMPARISON:  05/10/2017 . FINDINGS: Lower chest: Small right pleural effusion. Atelectasis in the lung bases, greater on the right. Moderate-sized esophageal hiatal hernia. Left breast implant. Hepatobiliary: Mildly radiopaque stones demonstrated in the gallbladder, including 1 in the gallbladder neck. The gallbladder is distended with thickened edematous wall and pericholecystic infiltration. Changes are consistent with acute cholecystitis. There is suggestion of a stone in the distal common bile duct. No significant bile duct dilatation. No focal liver lesions.  Pancreas: Unremarkable. No pancreatic ductal dilatation or surrounding inflammatory changes. Spleen: Focal wedge-shaped low-attenuation lesion in the spleen, new since previous study. This may indicate a splenic infarct. Adrenals/Urinary Tract: Adrenal glands are unremarkable. Kidneys are normal, without renal calculi, focal lesion, or hydronephrosis. Bladder is unremarkable. Stomach/Bowel: Stomach,  small bowel, and colon are not abnormally distended. Scattered stool throughout the colon. Colonic diverticula without evidence of diverticulitis. The appendix is normal. Vascular/Lymphatic: Aortic atherosclerosis. No enlarged abdominal or pelvic lymph nodes. Reproductive: Status post hysterectomy. No adnexal masses. Other: No abdominal wall hernia or abnormality. No abdominopelvic ascites. Musculoskeletal: No acute or significant osseous findings. IMPRESSION: 1. Changes of acute cholecystitis with distended thick-walled gallbladder and surrounding inflammation. Cholelithiasis with stone in the gallbladder neck. Possible stone in the distal common bile duct. 2. Normal appearance of the appendix. 3. Small right pleural effusion with atelectasis in the lung bases. 4. Moderate esophageal hiatal hernia. 5. Focal wedge-shaped low-attenuation lesion in the spleen is new since prior study and may reflect a splenic infarct. 6. Aortic atherosclerosis. Electronically Signed   By: Lucienne [name] M.D.   On: 07/12/2017 01:35   Nm Hepato W/eject Fract  Result Date: 07/12/2017 CLINICAL DATA:  Right upper quadrant abdominal pain. EXAM: NUCLEAR MEDICINE HEPATOBILIARY IMAGING TECHNIQUE: Sequential images of the abdomen were obtained out to 60 minutes following intravenous administration of radiopharmaceutical. RADIOPHARMACEUTICALS:  5.4 mCi Tc-59m  Choletec IV COMPARISON:  CT scan and ultrasound of same day. FINDINGS: Prompt uptake and biliary excretion of activity by the liver is seen. No filling of gallbladder is noted, consistent with cystic duct obstruction. Biliary activity passes into small bowel, consistent with patent common bile duct. IMPRESSION: No filling of gallbladder is noted consistent with cystic duct obstruction. This is concerning for acute cholecystitis. Electronically Signed   By: Marijo Conception, M.D.   On: 07/12/2017 17:09   US Abdomen Limited  Result Date: 07/12/2017 CLINICAL DATA:  Right upper  quadrant pain for 1 week, history of breast carcinoma EXAM: ULTRASOUND ABDOMEN LIMITED RIGHT UPPER QUADRANT COMPARISON:  CT abdomen pelvis of 07/12/2016 FINDINGS: Gallbladder: The gallbladder is well visualized and there are gallstones present, the largest of 2.2 cm. However there is no pain over the gallbladder with compression. The gallbladder wall is slightly prominent at 3.6 mm. Common bile duct: Diameter: The common bile duct is within upper limits of normal measuring 7.3 cm. Evaluation of the common bile duct is somewhat limited by overlying bowel gas. Liver: The parenchyma of the liver is normal in echogenicity. No focal hepatic abnormality is seen. Portal vein is patent on color Doppler imaging with normal direction of blood flow towards the liver. IMPRESSION: 1. Gallstones. The gallbladder wall is slightly prominent but no pain is present over the gallbladder with compression currently to indicate acute cholecystitis. 2. Minimal prominence of the common bile duct to maximum diameter of 7.3 cm. 3. No hepatic abnormality is seen. Electronically Signed   By: Ivar Drape M.D.   On: 07/12/2017 09:22   Ir Perc Cholecystostomy  Result Date: 07/13/2017 INDICATION: 72 year old female with a history of acute cholecystitis EXAM: IMAGE GUIDED PERCUTANEOUS CHOLECYSTOSTOMY MEDICATIONS: Zosyn; The antibiotic was administered within an appropriate time frame prior to the initiation of the procedure. ANESTHESIA/SEDATION: Moderate (conscious) sedation was employed during this procedure. A total of Versed 1.0 mg and Fentanyl 100 mcg was administered intravenously. Moderate Sedation Time: 14 minutes. The patient's level of consciousness and vital signs were monitored continuously by radiology nursing throughout the procedure under  my direct supervision. FLUOROSCOPY TIME:  Fluoroscopy Time: 0 minutes 42 seconds (102 mGy). COMPLICATIONS: None PROCEDURE: Informed written consent was obtained from the patient and the patient's  family after a thorough discussion of the procedural risks, benefits and alternatives. All questions were addressed. Maximal Sterile Barrier Technique was utilized including caps, mask, sterile gowns, sterile gloves, sterile drape, hand hygiene and skin antiseptic. A timeout was performed prior to the initiation of the procedure. Ultrasound survey of the right upper quadrant was performed for planning purposes. Once the patient is prepped and draped in the usual sterile fashion, the skin and subcutaneous tissues overlying the gallbladder were generously infiltrated 1% lidocaine for local anesthesia. A coaxial needle was advanced under ultrasound guidance through the skin subcutaneous tissues and a small segment of liver into the gallbladder lumen. With removal of the stylet, spontaneous dark bile drainage occurred. Using modified Seldinger technique, a 10 French drain was placed into the gallbladder fossa, with aspiration of the sample for the lab. Contrast injection confirmed position of the tube within the gallbladder lumen. Drainage catheter was attached to gravity drain with a suture retention placed. Patient tolerated the procedure well and remained hemodynamically stable throughout. No complications were encountered and no significant blood loss encountered. IMPRESSION: Status post image guided percutaneous cholecystostomy. Signed, Dulcy Fanny. Earleen Newport, DO [facility] Electronically Signed   By: Corrie Mckusick D.O.   On: 07/13/2017 15:02       Subjective: Patient is feeling better, no nausea or vomiting, no chest pain or dyspnea, tolerating po well.   Discharge Exam: Vitals:   07/15/17 2022 07/16/17 0458  BP: (!) 126/50 129/65  Pulse: 67 62  Resp: 16 18  Temp: 98.6 F (37 C) 98.9 F (37.2 C)  SpO2: 95% 97%   Vitals:   07/15/17 0839 07/15/17 1427 07/15/17 2022 07/16/17 0458  BP:  (!) 114/44 (!) 126/50 129/65  Pulse:  77 67 62  Resp:   18 16 18   Temp:  99 F (37.2 C) 98.6 F (37 C) 98.9 F (37.2 C)  TempSrc:  Oral Oral Oral  SpO2: 90% 92% 95% 97%  Weight:      Height:        General: Not in pain or dyspnea, deconditioned  Neurology: Awake and alert, non focal/ positive right hemiparesis E ENT: mild pallor, no icterus, oral mucosa moist Cardiovascular: No JVD. S1-S2 present, rhythmic, no gallops, rubs, or murmurs. No lower extremity edema. Pulmonary: vesicular breath sounds bilaterally, adequate air movement, no wheezing, rhonchi or rales. Gastrointestinal. Abdomen protuberatn, no organomegaly, non tender, no rebound or guarding/ Biliary drain in place.  Skin. No rashes Musculoskeletal: no joint deformities   The results of significant diagnostics from this hospitalization (including imaging, microbiology, ancillary and laboratory) are listed below for reference.     Microbiology: Recent Results (from the past 240 hour(s))  Aerobic/Anaerobic Culture (surgical/deep wound)     Status: None (Preliminary result)   Collection Time: 07/13/17 11:58 AM  Result Value Ref Range Status   Specimen Description   Final    GALL BLADDER Performed at [facility] [address] [place], [place] [address]    Special Requests   Final    NONE Performed at [facility], [place] [address] [place], [place] [address]    Gram Stain   Final    FEW WBC PRESENT, PREDOMINANTLY PMN NO ORGANISMS SEEN    Culture   Final    NO  GROWTH 3 DAYS NO ANAEROBES ISOLATED; CULTURE IN PROGRESS FOR 5 DAYS Performed at [facility], [place] [address] [place], [place] [address]    Report Status PENDING  Incomplete     Labs: BNP (last 3 results) No results for input(s): BNP in the last 8760 hours. Basic Metabolic Panel: Recent Labs  Lab 07/11/17 2120 07/12/17 0501 07/13/17 0402 07/15/17 0430  NA 135 134* 139 140  K 4.2 4.3 3.8 3.8  CL 99* 99* 106 105  CO2 25 24 24 24   GLUCOSE 113* 107* 89 99   BUN 28* 24* 18 12  CREATININE 1.25* 1.08* 0.97 0.88  CALCIUM 9.0 8.2* 8.1* 8.5*   Liver Function Tests: Recent Labs  Lab 07/11/17 2120 07/12/17 0501 07/13/17 0402  AST 19 27 59*  ALT 15 16 44  ALKPHOS 93 82 214*  BILITOT 0.5 1.3* 0.7  PROT 7.1 6.5 5.8*  ALBUMIN 3.1* 2.7* 2.6*   Recent Labs  Lab 07/11/17 2120  LIPASE 27   No results for input(s): AMMONIA in the last 168 hours. CBC: Recent Labs  Lab 07/11/17 2120 07/12/17 0501 07/13/17 0402 07/15/17 0430  WBC 9.2 9.6 6.5 4.8  NEUTROABS  --   --   --  2.5  HGB 13.3 12.1 11.4* 11.6*  HCT 42.4 37.8 36.2 37.9  MCV 80.8 80.8 82.1 83.7  PLT 179 178 176 183   Cardiac Enzymes: No results for input(s): CKTOTAL, CKMB, CKMBINDEX, TROPONINI in the last 168 hours. BNP: Invalid input(s): POCBNP CBG: Recent Labs  Lab 07/15/17 2019 07/16/17 0026 07/16/17 0404 07/16/17 0804 07/16/17 1230  GLUCAP 130* 112* 96 111* 120*   D-Dimer No results for input(s): DDIMER in the last 72 hours. Hgb A1c No results for input(s): HGBA1C in the last 72 hours. Lipid Profile No results for input(s): CHOL, HDL, LDLCALC, TRIG, CHOLHDL, LDLDIRECT in the last 72 hours. Thyroid function studies No results for input(s): TSH, T4TOTAL, T3FREE, THYROIDAB in the last 72 hours.  Invalid input(s): FREET3 Anemia work up No results for input(s): VITAMINB12, FOLATE, FERRITIN, TIBC, IRON, RETICCTPCT in the last 72 hours. Urinalysis    Component Value Date/Time   COLORURINE YELLOW 07/11/2017 2216   APPEARANCEUR HAZY (A) 07/11/2017 2216   LABSPEC 1.025 07/11/2017 2216   PHURINE 5.0 07/11/2017 2216   GLUCOSEU NEGATIVE 07/11/2017 2216   HGBUR NEGATIVE 07/11/2017 2216   [place] NEGATIVE 07/11/2017 2216   [place] 07/11/2017 2216   PROTEINUR 30 (A) 07/11/2017 2216   NITRITE NEGATIVE 07/11/2017 2216   LEUKOCYTESUR NEGATIVE 07/11/2017 2216   Sepsis Labs Invalid input(s): PROCALCITONIN,  WBC,  LACTICIDVEN Microbiology Recent Results  (from the past 240 hour(s))  Aerobic/Anaerobic Culture (surgical/deep wound)     Status: None (Preliminary result)   Collection Time: 07/13/17 11:58 AM  Result Value Ref Range Status   Specimen Description   Final    GALL BLADDER Performed at [facility], [place] [address] [place], [place] [address]    Special Requests   Final    NONE Performed at [facility], [place] [address] [place], [place] [address]    Gram Stain   Final    FEW WBC PRESENT, PREDOMINANTLY PMN NO ORGANISMS SEEN    Culture   Final    NO GROWTH 3 DAYS NO ANAEROBES ISOLATED; CULTURE IN PROGRESS FOR 5 DAYS Performed at [facility] [address], [place], [place] [address]    Report Status PENDING  Incomplete  Time coordinating discharge: 45 minutes  SIGNED:   Tawni Millers, MD  [facility] 07/16/2017, 2:11 PM Pager [phone]  If 7PM-7AM, please contact night-coverage [email or web address] Password [email or web address]

## 2017-07-16 NOTE — Progress Notes (Signed)
[facility] Progress Note     Subjective: CC- perc chole Sitting up eating breakfast. No complaints this morning. Denies any current abdominal pain. Tolerating regular diet. Denies n/v. No BM since admission, but states that at baseline she only has 1 BM every 3 days. She does report some mild superficial pain around drain with coughing, but otherwise her abdomen is pain free. Asking when she can go home.  Objective: Vital signs in last 24 hours: Temp:  [98.6 F (37 C)-99 F (37.2 C)] 98.9 F (37.2 C) (04/29 0458) Pulse Rate:  [62-77] 62 (04/29 0458) Resp:  [16-18] 18 (04/29 0458) BP: (114-129)/(44-65) 129/65 (04/29 0458) SpO2:  [92 %-97 %] 97 % (04/29 0458) Last BM Date: 07/09/17(pt unsure)  Intake/Output from previous day: 04/28 0701 - 04/29 0700 In: 530 [P.O.:360; I.V.:10; IV Piggyback:150] Out: 600 [Urine:400; Drains:200] Intake/Output this shift: Total I/O In: -  Out: 300 [Urine:300]  PE: Gen:  Alert, NAD, pleasant HEENT: EOM's intact, pupils equal and round Card:  RRR, no M/G/R heard Pulm:  CTAB, no W/R/R, effort normal VFI:EPPI,[id number], nontender, +BS, no masses, hernias, or organomegaly Ext:  mild BLE edema, calves soft and nontender Neuro: Weakness noted to RUE/RLE. Sensory and motor function intact LUE/LLE  Lab Results:  Recent Labs    07/15/17 0430  WBC 4.8  HGB 11.6*  HCT 37.9  PLT 183   BMET Recent Labs    07/15/17 0430  NA 140  K 3.8  CL 105  CO2 24  GLUCOSE 99  BUN 12  CREATININE 0.88  CALCIUM 8.5*   PT/INR No results for input(s): LABPROT, INR in the last 72 hours. CMP     Component Value Date/Time   NA 140 07/15/2017 0430   K 3.8 07/15/2017 0430   CL 105 07/15/2017 0430   CO2 24 07/15/2017 0430   GLUCOSE 99 07/15/2017 0430   BUN 12 07/15/2017 0430   CREATININE 0.88 07/15/2017 0430   CALCIUM 8.5 (L) 07/15/2017 0430   PROT 5.8 (L) 07/13/2017 0402   ALBUMIN 2.6 (L) 07/13/2017 0402   AST 59 (H) 07/13/2017  0402   ALT 44 07/13/2017 0402   ALKPHOS 214 (H) 07/13/2017 0402   BILITOT 0.7 07/13/2017 0402   GFRNONAA >60 07/15/2017 0430   GFRAA >60 07/15/2017 0430   Lipase     Component Value Date/Time   LIPASE 27 07/11/2017 2120       Studies/Results: No results found.  Anti-infectives: Anti-infectives (From admission, onward)   Start     Dose/Rate Route Frequency Ordered Stop   07/12/17 1000  piperacillin-tazobactam (ZOSYN) IVPB 3.375 g     3.375 g 12.5 mL/hr over 240 Minutes Intravenous Every 8 hours 07/12/17 0441     07/12/17 0215  piperacillin-tazobactam (ZOSYN) IVPB 3.375 g     3.375 g 100 mL/hr over 30 Minutes Intravenous  Once 07/12/17 0205 07/12/17 0438       Assessment/Plan H/o CAD with coronary artery spasm H/oCVA with residual right-sided hemiparesis COPD DM GERD H/o breast cancer HLD Depression  Acute cholecystitis S/p percutaneous cholecystostomy tube placement 4/26 -CT scan showsacute cholecystitis with distended thick-walled gallbladder and surrounding inflammation, cholelithiasis with stone in the gallbladder neckand possible stone in the distal common bile duct - u/s showed gallstones, gallbladder wall is slightly prominent but no pain is present over the gallbladder with compression currently to indicate acute cholecystitis - HIDA scan positive for acute cholecystitis - per cardiology consult 05/11/17, patient has a10.1% risk of major cardiac event  given history of coronary spasm associated with myocardial injury and prior CVA  ID -zosyn 4/25>> VTE -SCDs FEN -regular diet Foley -none Follow up -Dr. Hassell Done, [place] to restart plavix. Patient stable for discharge from surgical standpoint. She may transition to oral antibiotics and discharge home with perc chole tube. Patient will follow up with Dr. Hassell Done in about 3 weeks. We will sign off, please call with concerns.   LOS: 4 days    Wellington [place] , [facility]  Surgery 07/16/2017, 9:06 AM Pager: [phone] Consults: [phone] Mon-Fri 7:00 am-4:30 pm Sat-Sun 7:00 am-11:30 am

## 2017-07-16 NOTE — Progress Notes (Signed)
Physical Therapy Treatment Patient Details Name: Brandy Paul MRN: [id number] DOB: September 29, 1945 Today's Date: 07/16/2017    History of Present Illness Brandy Paul is a 72 y.o. 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    PT Comments    Assisted OOB to amb a limited distance.  Assisted on/off BSC per pt request to void.  Assisted back to bed per pt request to rest due to MAX c/o fatigue.   Follow Up Recommendations  Home health PT;Supervision/Assistance - 24 hour(spouse is her care taker for years)     Equipment Recommendations  None recommended by PT    Recommendations for Other Services       Precautions / Restrictions Precautions Precautions: Fall Precaution Comments: s/p CVA R Hemiparesis  Restrictions Weight Bearing Restrictions: No    Mobility  Bed Mobility Overal bed mobility: Needs Assistance Bed Mobility: Supine to Sit;Sit to Supine     Supine to sit: Mod assist Sit to supine: Mod assist   General bed mobility comments: assist with R LE off bed and to lift trunk with supine to sit then assist R LE up onto bed plus + 2 Total Assist to scoot to [facility]  Transfers Overall transfer level: Needs assistance Equipment used: Quad cane Transfers: Sit to/from [facility] Sit to Stand: Min assist;Mod assist Stand pivot transfers: Min assist;Mod assist       General transfer comment: 25% VC's on safety with turn completion    assisted on/off BSC with VC's for direction    Ambulation/Gait Ambulation/Gait assistance: Min assist;Mod assist Ambulation Distance (Feet): 28 Feet Assistive device: Quad cane Gait Pattern/deviations: Step-to pattern;Decreased stride length;Decreased stance time - right Gait velocity: decreased   General Gait Details: step to gait with hip hike R to compenstae  R LE hemiparesis      Limited distance due to fatigue.     Stairs             Wheelchair Mobility    Modified Rankin (Stroke Patients Only)       Balance                                            Cognition Arousal/Alertness: Awake/alert Behavior During Therapy: WFL for tasks assessed/performed Overall Cognitive Status: Within Functional Limits for tasks assessed                                        Exercises      General Comments        Pertinent Vitals/Pain Pain Assessment: Faces Faces Pain Scale: Hurts a little bit Pain Location: L side with sit <>stand at drain site Pain Descriptors / Indicators: Aching;Discomfort;Grimacing Pain Intervention(s): Monitored during session;Repositioned    Home Living                      Prior Function            PT Goals (current goals can now be found in the care plan section) Progress towards PT goals: Progressing toward goals    Frequency    Min 3X/week      PT Plan  Current plan remains appropriate    Co-evaluation              AM-PAC PT "6 Clicks" Daily Activity  Outcome Measure  Difficulty turning over in bed (including adjusting bedclothes, sheets and blankets)?: Unable Difficulty moving from lying on back to sitting on the side of the bed? : Unable Difficulty sitting down on and standing up from a chair with arms (e.g., wheelchair, bedside commode, etc,.)?: Unable Help needed moving to and from a bed to chair (including a wheelchair)?: A Little Help needed walking in hospital room?: A Little Help needed climbing 3-5 steps with a railing? : A Lot 6 Click Score: 11    End of Session Equipment Utilized During Treatment: Gait belt Activity Tolerance: Patient limited by fatigue Patient left: in bed;with call bell/phone within reach Nurse Communication: Mobility status PT Visit Diagnosis: Other abnormalities of gait and mobility (R26.89);Muscle  weakness (generalized) (M62.81)     Time: 2563-8937 PT Time Calculation (min) (ACUTE ONLY): 27 min  Charges:  $Gait Training: 8-22 mins $Therapeutic Activity: 8-22 mins                    G Codes:       Rica Koyanagi  PTA [facility]  Acute  Rehab Pager      [phone]

## 2017-07-16 NOTE — Progress Notes (Signed)
Referring Physician(s): Martin,M  Supervising Physician: Jacqulynn Cadet  [name]  Chief Complaint:  cholecystitis  Subjective: Pt doing ok today; has some soreness at GB drain site but only if palpated; denies N/V; asking when she can go home  Allergies: Ceclor [cefaclor]  Medications: Prior to Admission medications   Medication Sig Start Date End Date Taking? Authorizing Provider  acetaminophen (TYLENOL) 500 MG tablet Take 500 mg by mouth daily as needed for mild pain.    Yes [provider]  aspirin 81 MG tablet Take 81 mg by mouth every evening.    Yes [provider]  atorvastatin (LIPITOR) 20 MG tablet Take 20 mg by mouth at bedtime.  10/31/14  Yes [provider]  buPROPion (WELLBUTRIN SR) 150 MG 12 hr tablet Take 1 tablet by mouth daily. 05/18/17  Yes [provider]  Calcium Carbonate-Vitamin D (OSCAL 500/200 D-3 PO) Take 1 tablet by mouth every evening.    Yes [provider]  clopidogrel (PLAVIX) 75 MG tablet Take 1 tablet (75 mg total) by mouth at bedtime. 11/07/15  Yes Cross, Melissa D, NP  ezetimibe (ZETIA) 10 MG tablet Take 10 mg by mouth at bedtime.    Yes [provider]  isosorbide mononitrate (IMDUR) 60 MG 24 hr tablet TAKE 1 TABLET BY MOUTH DAILY 04/25/17  Yes Belva Crome, MD  nitroGLYCERIN (NITROSTAT) 0.4 MG SL tablet Place 0.4 mg under the tongue every 5 (five) minutes as needed for chest pain (Call 911 at 3rd dose within 15 minutes).   Yes [provider]  RABEprazole (ACIPHEX) 20 MG tablet Take 20 mg by mouth at bedtime.  10/29/14  Yes [provider]  sertraline (ZOLOFT) 25 MG tablet Take 50 mg by mouth at bedtime.  11/17/14  Yes [provider]  tiotropium (SPIRIVA HANDIHALER) 18 MCG inhalation capsule Place 18 mcg into inhaler and inhale daily.    Yes [provider]     Vital Signs: BP 129/65 (BP Location: Left Arm)   Pulse 62   Temp 98.9  F (37.2 C) (Oral)   Resp 18   Ht 5\' 5"  (1.651 m)   Wt 205 lb 4 oz (93.1 kg)   SpO2 97%   BMI 34.16 kg/m   Physical Exam GB drain intact, output 200 cc bilious fluid; dressing dry, site mildly tender; abd soft  Imaging: Nm Hepato W/eject Fract  Result Date: 07/12/2017 CLINICAL DATA:  Right upper quadrant abdominal pain. EXAM: NUCLEAR MEDICINE HEPATOBILIARY IMAGING TECHNIQUE: Sequential images of the abdomen were obtained out to 60 minutes following intravenous administration of radiopharmaceutical. RADIOPHARMACEUTICALS:  5.4 mCi Tc-72m  Choletec IV COMPARISON:  CT scan and ultrasound of same day. FINDINGS: Prompt uptake and biliary excretion of activity by the liver is seen. No filling of gallbladder is noted, consistent with cystic duct obstruction. Biliary activity passes into small bowel, consistent with patent common bile duct. IMPRESSION: No filling of gallbladder is noted consistent with cystic duct obstruction. This is concerning for acute cholecystitis. Electronically Signed   By: Marijo Conception, M.D.   On: 07/12/2017 17:09   Ir Perc Cholecystostomy  Result Date: 07/13/2017 INDICATION: 72 year old female with a history of acute cholecystitis EXAM: IMAGE GUIDED PERCUTANEOUS CHOLECYSTOSTOMY MEDICATIONS: Zosyn; The antibiotic was administered within an appropriate time frame prior to the initiation of the procedure. ANESTHESIA/SEDATION: Moderate (conscious) sedation was employed during this procedure. A total of Versed 1.0 mg and Fentanyl 100 mcg was administered intravenously.  Moderate Sedation Time: 14 minutes. The patient's level of consciousness and vital signs were monitored continuously by radiology nursing throughout the procedure under my direct supervision. FLUOROSCOPY TIME:  Fluoroscopy Time: 0 minutes 42 seconds (102 mGy). COMPLICATIONS: None PROCEDURE: Informed written consent was obtained from the patient and the patient's family after a thorough discussion of the procedural  risks, benefits and alternatives. All questions were addressed. Maximal Sterile Barrier Technique was utilized including caps, mask, sterile gowns, sterile gloves, sterile drape, hand hygiene and skin antiseptic. A timeout was performed prior to the initiation of the procedure. Ultrasound survey of the right upper quadrant was performed for planning purposes. Once the patient is prepped and draped in the usual sterile fashion, the skin and subcutaneous tissues overlying the gallbladder were generously infiltrated 1% lidocaine for local anesthesia. A coaxial needle was advanced under ultrasound guidance through the skin subcutaneous tissues and a small segment of liver into the gallbladder lumen. With removal of the stylet, spontaneous dark bile drainage occurred. Using modified Seldinger technique, a 10 French drain was placed into the gallbladder fossa, with aspiration of the sample for the lab. Contrast injection confirmed position of the tube within the gallbladder lumen. Drainage catheter was attached to gravity drain with a suture retention placed. Patient tolerated the procedure well and remained hemodynamically stable throughout. No complications were encountered and no significant blood loss encountered. IMPRESSION: Status post image guided percutaneous cholecystostomy. Signed, Dulcy Fanny. Earleen Newport, DO [facility] Radiology Electronically Signed   By: Corrie Mckusick D.O.   On: 07/13/2017 15:02    Labs:  CBC: Recent Labs    07/11/17 2120 07/12/17 0501 07/13/17 0402 07/15/17 0430  WBC 9.2 9.6 6.5 4.8  HGB 13.3 12.1 11.4* 11.6*  HCT 42.4 37.8 36.2 37.9  PLT 179 178 176 183    COAGS: Recent Labs    07/12/17 1216  INR 1.04    BMP: Recent Labs    07/11/17 2120 07/12/17 0501 07/13/17 0402 07/15/17 0430  NA 135 134* 139 140  K 4.2 4.3 3.8 3.8  CL 99* 99* 106 105  CO2 25 24 24 24   GLUCOSE 113* 107* 89 99  BUN 28* 24* 18 12  CALCIUM 9.0  8.2* 8.1* 8.5*  CREATININE 1.25* 1.08* 0.97 0.88  GFRNONAA 42* 50* 57* >60  GFRAA 49* 58* >60 >60    LIVER FUNCTION TESTS: Recent Labs    05/10/17 1730 07/11/17 2120 07/12/17 0501 07/13/17 0402  BILITOT 0.5 0.5 1.3* 0.7  AST 23 19 27  59*  ALT 17 15 16  44  ALKPHOS 99 93 82 214*  PROT 7.2 7.1 6.5 5.8*  ALBUMIN 3.7 3.1* 2.7* 2.6*    Assessment and Plan: S/p perc cholecystostomy 4/26 for acute cholecystitis; afebrile; no new labs today; bile cx neg to date; as OP rec once daily flush of drain with 5-10 cc sterile NS, output recording and dressing changes as needed; Recommend IR f/u  in 6 weeks with drain injection to evaluate for cystic duct patency. If the duct is patent, could proceed with a capping trial. Call [phone] or [phone] with any drain questions.     Electronically Signed: D. Rowe Robert, PA-C 07/16/2017, 2:25 PM   I spent a total of 15 minutes at the the patient's bedside AND on the patient's hospital floor or unit, greater than 50% of which was counseling/coordinating care for gallbladder drain    Patient ID: Brandy Paul, female   DOB: [birth year], 72 y.o.  MRN: [id number]

## 2017-07-16 NOTE — Progress Notes (Signed)
Patient is stable for discharge. Discharge instructions and medications have been reviewed with the patient and all questions answered. AVS and prescriptions given to patient. Reviewed with the patient and her husband how to empty and record output and to flush drain daily. Husband demonstrated understanding. Provided them with supplies for measuring output and a few flushes until the [place] can follow up with them.   Brandy Paul [facility] 07/16/2017 4:10 PM

## 2017-07-16 NOTE — Progress Notes (Signed)
Spoke with patient at bedside, no preference for agency for [facility]. Contacted [facility] and they are able to accept. Husband will be caregiver and provide transportation home. Discussed with nurse to provide drain care teaching to husband and provide supplies until [facility] comes. No DME needs. [phone]

## 2017-07-16 NOTE — Progress Notes (Signed)
PROGRESS NOTE    Brandy Paul  BHA:[id number] DOB: 1945/06/27 DOA: 07/11/2017 PCP: Velna Hatchet, MD   Brief Narrative:  [name] is a 72 year old female who presented for RUQ abdominal pain for 6 days. She has a past medical history significant for CAD with coronary artery spasm, stroke with right sided weakness and expressive aphasia, breast cancer and COPD. She was recently admitted 2 months ago for appendicitis but was not a candidate for surgery due to her moderate cardiac risk factors. She denies any fever, chills, nausea, vomiting, diarrhea. Reports the pain is constant and severe, not alleviated/aggrevated by food. States she has never had symptoms like this before. Upon admission, blood pressure 113/45 mmHg, temperature 98.5, pulse 108, respirations 18, oxygen saturation 92% on room air. Sodium 135, Potassium 4.2, Chloride 99, CO2 25, Glucose 113, BUN 28, Creatinine 1.25, Calcium 9, Anion gap 11, lipase 27, AST 19, ALT 15, GFR 42. WBC 9.2, Hgb 13.3, platelets 179, indirect bilirubin 0.9, direct bilirubin 0.4. UA negative for UTI, EKG normal sinus rhythm with T wave abnormality in the inferior leads. CT abdomen and pelvis showed acute cholecystitis and cholelithiasis with stone in gallbladder neck, appendix normal in appearance. US abdomen showed gallstones in the gallbladder consistent with acute cholecystitis.   Patient was admitted with a working diagnosis of abdominal pain due to acute cholecystitis.      Assessment & Plan:   Principal Problem:   Abdominal pain Active Problems:   CVA, old, aphasia   DM (diabetes mellitus), type 2, uncontrolled with complications (HCC)   Hemiparesis affecting right side as late effect of cerebrovascular accident ([place])   Acute cholecystitis   Abdominal pain secondary to acute cholecystitis -CT scan of abdomen showing acute cholecystitis with distended thick-walled gallbladder and surrounding inflammation, cholelithiasis with stone  in the gallbladder neck and possible stone in the distal common bile duct. HIDA scan showed acute cholecystitis. -Patient is afebrile, most recent labs unremarkable, WBC 4.8. -IR placed a percutaneous cholecystostomy tube 4/26, -220 ml tube output in last 24 hours. Patient clear for discharge home from surgical standpoint with perc chole tube in place, transition to oral antibiotics and follow up with Dr. [facility] Done in 3 weeks.  -Plan for discharge on Augmentin course for one week, for a total of 10-14 day course of antibiotics.   Acute Kidney Injury  -Resolved, creatinine stable at 0.88, Potassium 3.8.  Diabetes Mellitus type 2 -Continue insulin sliding scale. -Patient to follow up with PCP, as she is not being treated for diabetes and glucose has been elevated.  CAD -No complaints of chest pain. -Resume home meds.  COPD -No signs of exacerbations, patient saturating well on room air at 97%. -Continue Tiotropium daily.  Depression -Continue Wellbutrin, sertraline, tolerating well.  DVT prophylaxis: Lovenox. Code Status: FULL. Family Communication: Husband at bedside. Disposition Plan: Home with home health tomorrow 4/30.   Consultants:   General surgery.  IR.  Procedures:   Percutaneous cholecystostomy tube placed 4/26.   Antimicrobials:   Zosyn IV.  Subjective: Patient reports feeling much improved since her admission, tolerating regular diet well, no nausea, vomiting or diarrhea. Reports mild abdominal pain. No chest pain or dyspnea.  Objective: Vitals:   07/15/17 0839 07/15/17 1427 07/15/17 2022 07/16/17 0458  BP:  (!) 114/44 (!) 126/50 129/65  Pulse:  77 67 62  Resp:  18 16 18   Temp:  99 F (37.2 C) 98.6 F (37 C) 98.9 F (37.2 C)  TempSrc:  Oral  Oral Oral  SpO2: 90% 92% 95% 97%  Weight:      Height:        Intake/Output Summary (Last 24 hours) at 07/16/2017 1120 Last data filed at 07/16/2017 0929 Gross per 24 hour  Intake 480 ml  Output 900 ml  Net  -420 ml   Filed Weights   07/12/17 1645  Weight: 93.1 kg (205 lb 4 oz)    Examination:  General exam: Appears calm and comfortable  Respiratory system: Clear to auscultation. Respiratory effort normal. Cardiovascular system: S1 & S2 heard, RRR. No murmurs, rubs, gallops or clicks. No pedal edema. Gastrointestinal system: Abdomen is nondistended, soft and mildly tender to palpation. Normal bowel sounds heard. Central nervous system: Alert and oriented. No focal neurological deficits. Extremities: Weakness noted to RUE and LLE, residual from past CVA. Skin: No rashes, lesions or ulcers. No erythema or redness noted from cholecystostomy tube.  Psychiatry: Judgement and insight appear normal. Mood & affect appropriate.   Data Reviewed: I have personally reviewed following labs and imaging studies  CBC: Recent Labs  Lab 07/11/17 2120 07/12/17 0501 07/13/17 0402 07/15/17 0430  WBC 9.2 9.6 6.5 4.8  NEUTROABS  --   --   --  2.5  HGB 13.3 12.1 11.4* 11.6*  HCT 42.4 37.8 36.2 37.9  MCV 80.8 80.8 82.1 83.7  PLT 179 178 176 527   Basic Metabolic Panel: Recent Labs  Lab 07/11/17 2120 07/12/17 0501 07/13/17 0402 07/15/17 0430  NA 135 134* 139 140  K 4.2 4.3 3.8 3.8  CL 99* 99* 106 105  CO2 25 24 24 24   GLUCOSE 113* 107* 89 99  BUN 28* 24* 18 12  CREATININE 1.25* 1.08* 0.97 0.88  CALCIUM 9.0 8.2* 8.1* 8.5*   GFR: Estimated Creatinine Clearance: 65.1 mL/min (by C-G formula based on SCr of 0.88 mg/dL). Liver Function Tests: Recent Labs  Lab 07/11/17 2120 07/12/17 0501 07/13/17 0402  AST 19 27 59*  ALT 15 16 44  ALKPHOS 93 82 214*  BILITOT 0.5 1.3* 0.7  PROT 7.1 6.5 5.8*  ALBUMIN 3.1* 2.7* 2.6*   Recent Labs  Lab 07/11/17 2120  LIPASE 27   No results for input(s): AMMONIA in the last 168 hours. Coagulation Profile: Recent Labs  Lab 07/12/17 1216  INR 1.04   Cardiac Enzymes: No results for input(s): CKTOTAL, CKMB, CKMBINDEX, TROPONINI in the last 168  hours. BNP (last 3 results) No results for input(s): PROBNP in the last 8760 hours. HbA1C: No results for input(s): HGBA1C in the last 72 hours. CBG: Recent Labs  Lab 07/15/17 1704 07/15/17 2019 07/16/17 0026 07/16/17 0404 07/16/17 0804  GLUCAP 117* 130* 112* 96 111*   Lipid Profile: No results for input(s): CHOL, HDL, LDLCALC, TRIG, CHOLHDL, LDLDIRECT in the last 72 hours. Thyroid Function Tests: No results for input(s): TSH, T4TOTAL, FREET4, T3FREE, THYROIDAB in the last 72 hours. Anemia Panel: No results for input(s): VITAMINB12, FOLATE, FERRITIN, TIBC, IRON, RETICCTPCT in the last 72 hours. Sepsis Labs: No results for input(s): PROCALCITON, LATICACIDVEN in the last 168 hours.  Recent Results (from the past 240 hour(s))  Aerobic/Anaerobic Culture (surgical/deep wound)     Status: None (Preliminary result)   Collection Time: 07/13/17 11:58 AM  Result Value Ref Range Status   Specimen Description   Final    GALL BLADDER Performed at [facility] [address] [place], [place] [address]    Special Requests   Final    NONE Performed at [facility]  [place] [address], [place], [place] [address]    Gram Stain   Final    FEW WBC PRESENT, PREDOMINANTLY PMN NO ORGANISMS SEEN    Culture   Final    NO GROWTH 2 DAYS NO ANAEROBES ISOLATED; CULTURE IN PROGRESS FOR 5 DAYS Performed at [facility] [address], [place], [place] [address]    Report Status PENDING  Incomplete         Radiology Studies: No results found.      Scheduled Meds: . buPROPion  150 mg Oral BID  . insulin aspart  0-9 Units Subcutaneous Q4H  . sertraline  50 mg Oral QHS  . sodium chloride flush  5 mL Intracatheter Q8H  . tiotropium  18 mcg Inhalation Daily   Continuous Infusions: . piperacillin-tazobactam (ZOSYN)  IV 3.375 g (07/16/17 1002)     LOS: 4 days    Time spent: 25 minutes.    Eloy End, PA-S [facility]  If 7PM-7AM, please contact night-coverage [email or web address] Password [facility] 07/16/2017, 11:20 AM

## 2017-07-16 NOTE — Care Management Important Message (Signed)
Important Message  Patient Details  Name: Brandy Paul MRN: [id number] Date of Birth: [birth year]   Medicare Important Message Given:  Yes    [name], [name] 07/16/2017, [place] Message  Patient Details  Name: Brandy Paul MRN: [id number] Date of Birth: [birth year]   Medicare Important Message Given:  Yes    [name], [name] 07/16/2017, 11:22 AM

## 2017-07-18 LAB — AEROBIC/ANAEROBIC CULTURE W GRAM STAIN (SURGICAL/DEEP WOUND): Culture: NO GROWTH

## 2017-07-19 DIAGNOSIS — Z792 Long term (current) use of antibiotics: Secondary | ICD-10-CM | POA: Diagnosis not present

## 2017-07-19 DIAGNOSIS — D509 Iron deficiency anemia, unspecified: Secondary | ICD-10-CM | POA: Diagnosis not present

## 2017-07-19 DIAGNOSIS — Z9181 History of falling: Secondary | ICD-10-CM | POA: Diagnosis not present

## 2017-07-19 DIAGNOSIS — J439 Emphysema, unspecified: Secondary | ICD-10-CM | POA: Diagnosis not present

## 2017-07-19 DIAGNOSIS — Z7902 Long term (current) use of antithrombotics/antiplatelets: Secondary | ICD-10-CM | POA: Diagnosis not present

## 2017-07-19 DIAGNOSIS — I10 Essential (primary) hypertension: Secondary | ICD-10-CM | POA: Diagnosis not present

## 2017-07-19 DIAGNOSIS — K219 Gastro-esophageal reflux disease without esophagitis: Secondary | ICD-10-CM | POA: Diagnosis not present

## 2017-07-19 DIAGNOSIS — Z853 Personal history of malignant neoplasm of breast: Secondary | ICD-10-CM | POA: Diagnosis not present

## 2017-07-19 DIAGNOSIS — K810 Acute cholecystitis: Secondary | ICD-10-CM | POA: Diagnosis not present

## 2017-07-19 DIAGNOSIS — E119 Type 2 diabetes mellitus without complications: Secondary | ICD-10-CM | POA: Diagnosis not present

## 2017-07-19 DIAGNOSIS — F329 Major depressive disorder, single episode, unspecified: Secondary | ICD-10-CM | POA: Diagnosis not present

## 2017-07-19 DIAGNOSIS — Z434 Encounter for attention to other artificial openings of digestive tract: Secondary | ICD-10-CM | POA: Diagnosis not present

## 2017-07-19 DIAGNOSIS — E785 Hyperlipidemia, unspecified: Secondary | ICD-10-CM | POA: Diagnosis not present

## 2017-07-19 DIAGNOSIS — I69351 Hemiplegia and hemiparesis following cerebral infarction affecting right dominant side: Secondary | ICD-10-CM | POA: Diagnosis not present

## 2017-07-19 DIAGNOSIS — I69320 Aphasia following cerebral infarction: Secondary | ICD-10-CM | POA: Diagnosis not present

## 2017-07-19 DIAGNOSIS — I2510 Atherosclerotic heart disease of native coronary artery without angina pectoris: Secondary | ICD-10-CM | POA: Diagnosis not present

## 2017-07-19 DIAGNOSIS — Z87891 Personal history of nicotine dependence: Secondary | ICD-10-CM | POA: Diagnosis not present

## 2017-07-23 DIAGNOSIS — I10 Essential (primary) hypertension: Secondary | ICD-10-CM | POA: Diagnosis not present

## 2017-07-23 DIAGNOSIS — K810 Acute cholecystitis: Secondary | ICD-10-CM | POA: Diagnosis not present

## 2017-07-23 DIAGNOSIS — I69320 Aphasia following cerebral infarction: Secondary | ICD-10-CM | POA: Diagnosis not present

## 2017-07-23 DIAGNOSIS — Z434 Encounter for attention to other artificial openings of digestive tract: Secondary | ICD-10-CM | POA: Diagnosis not present

## 2017-07-23 DIAGNOSIS — I69351 Hemiplegia and hemiparesis following cerebral infarction affecting right dominant side: Secondary | ICD-10-CM | POA: Diagnosis not present

## 2017-07-23 DIAGNOSIS — E119 Type 2 diabetes mellitus without complications: Secondary | ICD-10-CM | POA: Diagnosis not present

## 2017-07-24 DIAGNOSIS — E119 Type 2 diabetes mellitus without complications: Secondary | ICD-10-CM | POA: Diagnosis not present

## 2017-07-24 DIAGNOSIS — I10 Essential (primary) hypertension: Secondary | ICD-10-CM | POA: Diagnosis not present

## 2017-07-24 DIAGNOSIS — Z434 Encounter for attention to other artificial openings of digestive tract: Secondary | ICD-10-CM | POA: Diagnosis not present

## 2017-07-24 DIAGNOSIS — I69320 Aphasia following cerebral infarction: Secondary | ICD-10-CM | POA: Diagnosis not present

## 2017-07-24 DIAGNOSIS — K810 Acute cholecystitis: Secondary | ICD-10-CM | POA: Diagnosis not present

## 2017-07-24 DIAGNOSIS — I69351 Hemiplegia and hemiparesis following cerebral infarction affecting right dominant side: Secondary | ICD-10-CM | POA: Diagnosis not present

## 2017-07-25 DIAGNOSIS — E119 Type 2 diabetes mellitus without complications: Secondary | ICD-10-CM | POA: Diagnosis not present

## 2017-07-25 DIAGNOSIS — I10 Essential (primary) hypertension: Secondary | ICD-10-CM | POA: Diagnosis not present

## 2017-07-25 DIAGNOSIS — I69320 Aphasia following cerebral infarction: Secondary | ICD-10-CM | POA: Diagnosis not present

## 2017-07-25 DIAGNOSIS — I69351 Hemiplegia and hemiparesis following cerebral infarction affecting right dominant side: Secondary | ICD-10-CM | POA: Diagnosis not present

## 2017-07-25 DIAGNOSIS — Z434 Encounter for attention to other artificial openings of digestive tract: Secondary | ICD-10-CM | POA: Diagnosis not present

## 2017-07-25 DIAGNOSIS — K810 Acute cholecystitis: Secondary | ICD-10-CM | POA: Diagnosis not present

## 2017-07-26 DIAGNOSIS — E119 Type 2 diabetes mellitus without complications: Secondary | ICD-10-CM | POA: Diagnosis not present

## 2017-07-26 DIAGNOSIS — I69320 Aphasia following cerebral infarction: Secondary | ICD-10-CM | POA: Diagnosis not present

## 2017-07-26 DIAGNOSIS — Z434 Encounter for attention to other artificial openings of digestive tract: Secondary | ICD-10-CM | POA: Diagnosis not present

## 2017-07-26 DIAGNOSIS — K810 Acute cholecystitis: Secondary | ICD-10-CM | POA: Diagnosis not present

## 2017-07-26 DIAGNOSIS — I10 Essential (primary) hypertension: Secondary | ICD-10-CM | POA: Diagnosis not present

## 2017-07-26 DIAGNOSIS — I69351 Hemiplegia and hemiparesis following cerebral infarction affecting right dominant side: Secondary | ICD-10-CM | POA: Diagnosis not present

## 2017-07-30 DIAGNOSIS — Z434 Encounter for attention to other artificial openings of digestive tract: Secondary | ICD-10-CM | POA: Diagnosis not present

## 2017-07-30 DIAGNOSIS — I69320 Aphasia following cerebral infarction: Secondary | ICD-10-CM | POA: Diagnosis not present

## 2017-07-30 DIAGNOSIS — E119 Type 2 diabetes mellitus without complications: Secondary | ICD-10-CM | POA: Diagnosis not present

## 2017-07-30 DIAGNOSIS — I69351 Hemiplegia and hemiparesis following cerebral infarction affecting right dominant side: Secondary | ICD-10-CM | POA: Diagnosis not present

## 2017-07-30 DIAGNOSIS — I10 Essential (primary) hypertension: Secondary | ICD-10-CM | POA: Diagnosis not present

## 2017-07-30 DIAGNOSIS — K810 Acute cholecystitis: Secondary | ICD-10-CM | POA: Diagnosis not present

## 2017-07-30 MED FILL — NORMAL SALINE FLUSH SYRINGE: 0.9 | 30 days supply | Qty: 600 | Fill #0

## 2017-08-01 DIAGNOSIS — K819 Cholecystitis, unspecified: Secondary | ICD-10-CM | POA: Diagnosis not present

## 2017-08-01 DIAGNOSIS — K37 Unspecified appendicitis: Secondary | ICD-10-CM | POA: Diagnosis not present

## 2017-08-03 DIAGNOSIS — I69320 Aphasia following cerebral infarction: Secondary | ICD-10-CM | POA: Diagnosis not present

## 2017-08-03 DIAGNOSIS — E119 Type 2 diabetes mellitus without complications: Secondary | ICD-10-CM | POA: Diagnosis not present

## 2017-08-03 DIAGNOSIS — K810 Acute cholecystitis: Secondary | ICD-10-CM | POA: Diagnosis not present

## 2017-08-03 DIAGNOSIS — I69351 Hemiplegia and hemiparesis following cerebral infarction affecting right dominant side: Secondary | ICD-10-CM | POA: Diagnosis not present

## 2017-08-03 DIAGNOSIS — I10 Essential (primary) hypertension: Secondary | ICD-10-CM | POA: Diagnosis not present

## 2017-08-03 DIAGNOSIS — Z434 Encounter for attention to other artificial openings of digestive tract: Secondary | ICD-10-CM | POA: Diagnosis not present

## 2017-08-06 DIAGNOSIS — I69320 Aphasia following cerebral infarction: Secondary | ICD-10-CM | POA: Diagnosis not present

## 2017-08-06 DIAGNOSIS — I10 Essential (primary) hypertension: Secondary | ICD-10-CM | POA: Diagnosis not present

## 2017-08-06 DIAGNOSIS — I69351 Hemiplegia and hemiparesis following cerebral infarction affecting right dominant side: Secondary | ICD-10-CM | POA: Diagnosis not present

## 2017-08-06 DIAGNOSIS — K810 Acute cholecystitis: Secondary | ICD-10-CM | POA: Diagnosis not present

## 2017-08-06 DIAGNOSIS — E119 Type 2 diabetes mellitus without complications: Secondary | ICD-10-CM | POA: Diagnosis not present

## 2017-08-06 DIAGNOSIS — Z434 Encounter for attention to other artificial openings of digestive tract: Secondary | ICD-10-CM | POA: Diagnosis not present

## 2017-08-07 ENCOUNTER — Other Ambulatory Visit (HOSPITAL_COMMUNITY): Payer: Self-pay | Admitting: Interventional Radiology

## 2017-08-07 DIAGNOSIS — K810 Acute cholecystitis: Secondary | ICD-10-CM

## 2017-08-08 ENCOUNTER — Encounter (HOSPITAL_COMMUNITY): Payer: Self-pay | Admitting: Interventional Radiology

## 2017-08-08 ENCOUNTER — Ambulatory Visit (HOSPITAL_COMMUNITY)
Admission: RE | Admit: 2017-08-08 | Discharge: 2017-08-08 | Disposition: A | Discharge status: Home / Self Care | Payer: Medicare Other | Source: Ambulatory Visit | Attending: Interventional Radiology | Admitting: Interventional Radiology

## 2017-08-08 DIAGNOSIS — Z4682 Encounter for fitting and adjustment of non-vascular catheter: Secondary | ICD-10-CM | POA: Insufficient documentation

## 2017-08-08 DIAGNOSIS — E119 Type 2 diabetes mellitus without complications: Secondary | ICD-10-CM | POA: Diagnosis not present

## 2017-08-08 DIAGNOSIS — I69320 Aphasia following cerebral infarction: Secondary | ICD-10-CM | POA: Diagnosis not present

## 2017-08-08 DIAGNOSIS — K8011 Calculus of gallbladder with chronic cholecystitis with obstruction: Secondary | ICD-10-CM | POA: Diagnosis not present

## 2017-08-08 DIAGNOSIS — Z434 Encounter for attention to other artificial openings of digestive tract: Secondary | ICD-10-CM | POA: Diagnosis not present

## 2017-08-08 DIAGNOSIS — K810 Acute cholecystitis: Secondary | ICD-10-CM

## 2017-08-08 DIAGNOSIS — I10 Essential (primary) hypertension: Secondary | ICD-10-CM | POA: Diagnosis not present

## 2017-08-08 DIAGNOSIS — I69351 Hemiplegia and hemiparesis following cerebral infarction affecting right dominant side: Secondary | ICD-10-CM | POA: Diagnosis not present

## 2017-08-08 DIAGNOSIS — K820 Obstruction of gallbladder: Secondary | ICD-10-CM | POA: Diagnosis not present

## 2017-08-08 HISTORY — PX: IR CHOLANGIOGRAM EXISTING TUBE: IMG6040

## 2017-08-08 MED ORDER — LIDOCAINE HCL 1 % IJ SOLN
INTRAMUSCULAR | Status: AC
  Filled 2017-08-08: qty 20

## 2017-08-08 MED ORDER — IOPAMIDOL (ISOVUE-300) INJECTION 61%
INTRAVENOUS | Status: AC
  Administered 2017-08-08: 15 mL
  Filled 2017-08-08: qty 50

## 2017-08-09 DIAGNOSIS — I69320 Aphasia following cerebral infarction: Secondary | ICD-10-CM | POA: Diagnosis not present

## 2017-08-09 DIAGNOSIS — K810 Acute cholecystitis: Secondary | ICD-10-CM | POA: Diagnosis not present

## 2017-08-09 DIAGNOSIS — E119 Type 2 diabetes mellitus without complications: Secondary | ICD-10-CM | POA: Diagnosis not present

## 2017-08-09 DIAGNOSIS — I10 Essential (primary) hypertension: Secondary | ICD-10-CM | POA: Diagnosis not present

## 2017-08-09 DIAGNOSIS — I69351 Hemiplegia and hemiparesis following cerebral infarction affecting right dominant side: Secondary | ICD-10-CM | POA: Diagnosis not present

## 2017-08-09 DIAGNOSIS — Z434 Encounter for attention to other artificial openings of digestive tract: Secondary | ICD-10-CM | POA: Diagnosis not present

## 2017-08-10 DIAGNOSIS — K810 Acute cholecystitis: Secondary | ICD-10-CM | POA: Diagnosis not present

## 2017-08-10 DIAGNOSIS — I69351 Hemiplegia and hemiparesis following cerebral infarction affecting right dominant side: Secondary | ICD-10-CM | POA: Diagnosis not present

## 2017-08-10 DIAGNOSIS — Z434 Encounter for attention to other artificial openings of digestive tract: Secondary | ICD-10-CM | POA: Diagnosis not present

## 2017-08-10 DIAGNOSIS — I10 Essential (primary) hypertension: Secondary | ICD-10-CM | POA: Diagnosis not present

## 2017-08-10 DIAGNOSIS — I69320 Aphasia following cerebral infarction: Secondary | ICD-10-CM | POA: Diagnosis not present

## 2017-08-10 DIAGNOSIS — E119 Type 2 diabetes mellitus without complications: Secondary | ICD-10-CM | POA: Diagnosis not present

## 2017-08-13 DIAGNOSIS — I69320 Aphasia following cerebral infarction: Secondary | ICD-10-CM | POA: Diagnosis not present

## 2017-08-13 DIAGNOSIS — Z434 Encounter for attention to other artificial openings of digestive tract: Secondary | ICD-10-CM | POA: Diagnosis not present

## 2017-08-13 DIAGNOSIS — I69351 Hemiplegia and hemiparesis following cerebral infarction affecting right dominant side: Secondary | ICD-10-CM | POA: Diagnosis not present

## 2017-08-13 DIAGNOSIS — E119 Type 2 diabetes mellitus without complications: Secondary | ICD-10-CM | POA: Diagnosis not present

## 2017-08-13 DIAGNOSIS — K810 Acute cholecystitis: Secondary | ICD-10-CM | POA: Diagnosis not present

## 2017-08-13 DIAGNOSIS — I10 Essential (primary) hypertension: Secondary | ICD-10-CM | POA: Diagnosis not present

## 2017-08-14 DIAGNOSIS — I10 Essential (primary) hypertension: Secondary | ICD-10-CM | POA: Diagnosis not present

## 2017-08-14 DIAGNOSIS — E119 Type 2 diabetes mellitus without complications: Secondary | ICD-10-CM | POA: Diagnosis not present

## 2017-08-14 DIAGNOSIS — Z434 Encounter for attention to other artificial openings of digestive tract: Secondary | ICD-10-CM | POA: Diagnosis not present

## 2017-08-14 DIAGNOSIS — I69320 Aphasia following cerebral infarction: Secondary | ICD-10-CM | POA: Diagnosis not present

## 2017-08-14 DIAGNOSIS — I69351 Hemiplegia and hemiparesis following cerebral infarction affecting right dominant side: Secondary | ICD-10-CM | POA: Diagnosis not present

## 2017-08-14 DIAGNOSIS — K810 Acute cholecystitis: Secondary | ICD-10-CM | POA: Diagnosis not present

## 2017-08-15 DIAGNOSIS — Z434 Encounter for attention to other artificial openings of digestive tract: Secondary | ICD-10-CM | POA: Diagnosis not present

## 2017-08-15 DIAGNOSIS — I69320 Aphasia following cerebral infarction: Secondary | ICD-10-CM | POA: Diagnosis not present

## 2017-08-15 DIAGNOSIS — K810 Acute cholecystitis: Secondary | ICD-10-CM | POA: Diagnosis not present

## 2017-08-15 DIAGNOSIS — I10 Essential (primary) hypertension: Secondary | ICD-10-CM | POA: Diagnosis not present

## 2017-08-15 DIAGNOSIS — I69351 Hemiplegia and hemiparesis following cerebral infarction affecting right dominant side: Secondary | ICD-10-CM | POA: Diagnosis not present

## 2017-08-15 DIAGNOSIS — E119 Type 2 diabetes mellitus without complications: Secondary | ICD-10-CM | POA: Diagnosis not present

## 2017-08-16 DIAGNOSIS — K810 Acute cholecystitis: Secondary | ICD-10-CM | POA: Diagnosis not present

## 2017-08-16 DIAGNOSIS — E119 Type 2 diabetes mellitus without complications: Secondary | ICD-10-CM | POA: Diagnosis not present

## 2017-08-16 DIAGNOSIS — Z434 Encounter for attention to other artificial openings of digestive tract: Secondary | ICD-10-CM | POA: Diagnosis not present

## 2017-08-16 DIAGNOSIS — I69320 Aphasia following cerebral infarction: Secondary | ICD-10-CM | POA: Diagnosis not present

## 2017-08-16 DIAGNOSIS — I10 Essential (primary) hypertension: Secondary | ICD-10-CM | POA: Diagnosis not present

## 2017-08-16 DIAGNOSIS — I69351 Hemiplegia and hemiparesis following cerebral infarction affecting right dominant side: Secondary | ICD-10-CM | POA: Diagnosis not present

## 2017-08-17 DIAGNOSIS — I69320 Aphasia following cerebral infarction: Secondary | ICD-10-CM | POA: Diagnosis not present

## 2017-08-17 DIAGNOSIS — I10 Essential (primary) hypertension: Secondary | ICD-10-CM | POA: Diagnosis not present

## 2017-08-17 DIAGNOSIS — Z434 Encounter for attention to other artificial openings of digestive tract: Secondary | ICD-10-CM | POA: Diagnosis not present

## 2017-08-17 DIAGNOSIS — K810 Acute cholecystitis: Secondary | ICD-10-CM | POA: Diagnosis not present

## 2017-08-17 DIAGNOSIS — E119 Type 2 diabetes mellitus without complications: Secondary | ICD-10-CM | POA: Diagnosis not present

## 2017-08-17 DIAGNOSIS — I69351 Hemiplegia and hemiparesis following cerebral infarction affecting right dominant side: Secondary | ICD-10-CM | POA: Diagnosis not present

## 2017-08-20 DIAGNOSIS — K810 Acute cholecystitis: Secondary | ICD-10-CM | POA: Diagnosis not present

## 2017-08-20 DIAGNOSIS — I69351 Hemiplegia and hemiparesis following cerebral infarction affecting right dominant side: Secondary | ICD-10-CM | POA: Diagnosis not present

## 2017-08-20 DIAGNOSIS — Z434 Encounter for attention to other artificial openings of digestive tract: Secondary | ICD-10-CM | POA: Diagnosis not present

## 2017-08-20 DIAGNOSIS — I10 Essential (primary) hypertension: Secondary | ICD-10-CM | POA: Diagnosis not present

## 2017-08-20 DIAGNOSIS — I69320 Aphasia following cerebral infarction: Secondary | ICD-10-CM | POA: Diagnosis not present

## 2017-08-20 DIAGNOSIS — E119 Type 2 diabetes mellitus without complications: Secondary | ICD-10-CM | POA: Diagnosis not present

## 2017-08-23 DIAGNOSIS — K810 Acute cholecystitis: Secondary | ICD-10-CM | POA: Diagnosis not present

## 2017-08-23 DIAGNOSIS — I69320 Aphasia following cerebral infarction: Secondary | ICD-10-CM | POA: Diagnosis not present

## 2017-08-23 DIAGNOSIS — I10 Essential (primary) hypertension: Secondary | ICD-10-CM | POA: Diagnosis not present

## 2017-08-23 DIAGNOSIS — E119 Type 2 diabetes mellitus without complications: Secondary | ICD-10-CM | POA: Diagnosis not present

## 2017-08-23 DIAGNOSIS — I69351 Hemiplegia and hemiparesis following cerebral infarction affecting right dominant side: Secondary | ICD-10-CM | POA: Diagnosis not present

## 2017-08-23 DIAGNOSIS — Z434 Encounter for attention to other artificial openings of digestive tract: Secondary | ICD-10-CM | POA: Diagnosis not present

## 2017-08-27 ENCOUNTER — Other Ambulatory Visit (HOSPITAL_COMMUNITY): Payer: Self-pay | Admitting: Radiology

## 2017-08-27 ENCOUNTER — Encounter (HOSPITAL_COMMUNITY): Payer: Self-pay | Admitting: Diagnostic Radiology

## 2017-08-27 ENCOUNTER — Ambulatory Visit (HOSPITAL_COMMUNITY)
Admit: 2017-08-27 | Discharge: 2017-08-27 | Disposition: A | Discharge status: Home / Self Care | Payer: Medicare Other | Source: Ambulatory Visit | Attending: Radiology | Admitting: Radiology

## 2017-08-27 DIAGNOSIS — K819 Cholecystitis, unspecified: Secondary | ICD-10-CM

## 2017-08-27 DIAGNOSIS — Z4682 Encounter for fitting and adjustment of non-vascular catheter: Secondary | ICD-10-CM | POA: Insufficient documentation

## 2017-08-27 DIAGNOSIS — K8020 Calculus of gallbladder without cholecystitis without obstruction: Secondary | ICD-10-CM | POA: Insufficient documentation

## 2017-08-27 DIAGNOSIS — T85590A Other mechanical complication of bile duct prosthesis, initial encounter: Secondary | ICD-10-CM | POA: Diagnosis not present

## 2017-08-27 HISTORY — PX: IR CHOLANGIOGRAM EXISTING TUBE: IMG6040

## 2017-08-27 MED ORDER — IOPAMIDOL (ISOVUE-300) INJECTION 61%
50.0000 mL | Freq: Once | INTRAVENOUS | Status: AC | PRN
  Administered 2017-08-27: 20 mL

## 2017-08-27 MED ORDER — IOPAMIDOL (ISOVUE-300) INJECTION 61%
INTRAVENOUS | Status: AC
  Administered 2017-08-27: 20 mL
  Filled 2017-08-27: qty 50

## 2017-08-27 MED ORDER — LIDOCAINE-EPINEPHRINE (PF) 2 %-1:200000 IJ SOLN
INTRAMUSCULAR | Status: AC
  Filled 2017-08-27: qty 20

## 2017-08-27 NOTE — Procedures (Signed)
Cholangiogram demonstrated a displaced tube and not positioned in gallbladder.  Minimal filling of the gallbladder.  Unsuccessful attempt at replacing the drain in the gallbladder.  US demonstrated a decompressed gallbladder with stones.  No blood loss and no immediate complication.  Patient is scheduled to follow up with General Surgery.

## 2017-08-28 DIAGNOSIS — E119 Type 2 diabetes mellitus without complications: Secondary | ICD-10-CM | POA: Diagnosis not present

## 2017-08-28 DIAGNOSIS — I10 Essential (primary) hypertension: Secondary | ICD-10-CM | POA: Diagnosis not present

## 2017-08-28 DIAGNOSIS — I69351 Hemiplegia and hemiparesis following cerebral infarction affecting right dominant side: Secondary | ICD-10-CM | POA: Diagnosis not present

## 2017-08-28 DIAGNOSIS — Z434 Encounter for attention to other artificial openings of digestive tract: Secondary | ICD-10-CM | POA: Diagnosis not present

## 2017-08-28 DIAGNOSIS — K810 Acute cholecystitis: Secondary | ICD-10-CM | POA: Diagnosis not present

## 2017-08-28 DIAGNOSIS — I69320 Aphasia following cerebral infarction: Secondary | ICD-10-CM | POA: Diagnosis not present

## 2017-09-03 DIAGNOSIS — I10 Essential (primary) hypertension: Secondary | ICD-10-CM | POA: Diagnosis not present

## 2017-09-03 DIAGNOSIS — E119 Type 2 diabetes mellitus without complications: Secondary | ICD-10-CM | POA: Diagnosis not present

## 2017-09-03 DIAGNOSIS — Z434 Encounter for attention to other artificial openings of digestive tract: Secondary | ICD-10-CM | POA: Diagnosis not present

## 2017-09-03 DIAGNOSIS — K810 Acute cholecystitis: Secondary | ICD-10-CM | POA: Diagnosis not present

## 2017-09-03 DIAGNOSIS — I69320 Aphasia following cerebral infarction: Secondary | ICD-10-CM | POA: Diagnosis not present

## 2017-09-03 DIAGNOSIS — I69351 Hemiplegia and hemiparesis following cerebral infarction affecting right dominant side: Secondary | ICD-10-CM | POA: Diagnosis not present

## 2017-09-05 DIAGNOSIS — K37 Unspecified appendicitis: Secondary | ICD-10-CM | POA: Diagnosis not present

## 2017-09-05 DIAGNOSIS — K819 Cholecystitis, unspecified: Secondary | ICD-10-CM | POA: Diagnosis not present

## 2017-09-07 ENCOUNTER — Telehealth: Payer: Self-pay

## 2017-09-07 DIAGNOSIS — K819 Cholecystitis, unspecified: Secondary | ICD-10-CM | POA: Diagnosis not present

## 2017-09-07 DIAGNOSIS — R1031 Right lower quadrant pain: Secondary | ICD-10-CM | POA: Diagnosis not present

## 2017-09-07 DIAGNOSIS — D649 Anemia, unspecified: Secondary | ICD-10-CM | POA: Diagnosis not present

## 2017-09-07 DIAGNOSIS — F325 Major depressive disorder, single episode, in full remission: Secondary | ICD-10-CM | POA: Diagnosis not present

## 2017-09-07 DIAGNOSIS — Z6835 Body mass index (BMI) 35.0-35.9, adult: Secondary | ICD-10-CM | POA: Diagnosis not present

## 2017-09-07 DIAGNOSIS — K5909 Other constipation: Secondary | ICD-10-CM | POA: Diagnosis not present

## 2017-09-07 DIAGNOSIS — E119 Type 2 diabetes mellitus without complications: Secondary | ICD-10-CM | POA: Diagnosis not present

## 2017-09-07 DIAGNOSIS — E7849 Other hyperlipidemia: Secondary | ICD-10-CM | POA: Diagnosis not present

## 2017-09-07 NOTE — Telephone Encounter (Signed)
   [facility] Pre-operative Risk Assessment    Request for surgical clearance:  1. What type of surgery is being performed? Cholecystectomy and lap appendectomy   2. When is this surgery scheduled? pending  3. What type of clearance is required (medical clearance vs. Pharmacy clearance to hold med vs. Both)? both  4. Are there any medications that need to be held prior to surgery and how long? Plavix  5. Practice name and name of physician performing surgery? [facility], Dr. [facility] Done   6. What is your office phone number [phone]    7.   What is your office fax number [phone] attn Malachi Bonds CMA  8.   Anesthesia type (None, local, MAC, general) ? General    Joaquim Lai 09/07/2017, 4:24 PM  _________________________________________________________________   (provider comments below)

## 2017-09-10 DIAGNOSIS — I10 Essential (primary) hypertension: Secondary | ICD-10-CM | POA: Diagnosis not present

## 2017-09-10 DIAGNOSIS — K810 Acute cholecystitis: Secondary | ICD-10-CM | POA: Diagnosis not present

## 2017-09-10 DIAGNOSIS — I69351 Hemiplegia and hemiparesis following cerebral infarction affecting right dominant side: Secondary | ICD-10-CM | POA: Diagnosis not present

## 2017-09-10 DIAGNOSIS — E119 Type 2 diabetes mellitus without complications: Secondary | ICD-10-CM | POA: Diagnosis not present

## 2017-09-10 DIAGNOSIS — I69320 Aphasia following cerebral infarction: Secondary | ICD-10-CM | POA: Diagnosis not present

## 2017-09-10 DIAGNOSIS — Z434 Encounter for attention to other artificial openings of digestive tract: Secondary | ICD-10-CM | POA: Diagnosis not present

## 2017-09-11 NOTE — Telephone Encounter (Addendum)
   Primary Cardiologist: Sinclair Grooms, MD  Chart reviewed as part of pre-operative protocol coverage. Patient was contacted 09/11/2017 in reference to pre-operative risk assessment for pending surgery as outlined below.  Brandy Paul was last seen 04/2017 by Dr. Tamala Julian. 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 Per Dr. Tamala Julian she has history of coronary spasm commencing in 2003, with 3 recurring episodes of prolonged discomfort associated with myocardial injury. She has h/o normal myocardial perfusion study (August 2017).    Addendum: I spoke with patient. She is doing well without any cardiac symptoms. No chest pain, SOB, palpitations or chest pain. She is able to walk regularly without angina, achieving >4 METS. Therefore, based on ACC/AHA guidelines, Brandy Paul would be at acceptable risk for the planned procedure without further cardiovascular testing.   The patient is on Plavix for neurologic reasons (history of stroke) rather than cardiac reasons. The patient's husband confirms it is Dr. Ardeth Perfect that prescribes it, so clearance to come off this should come through that provider.  I will route this recommendation to the requesting party via Epic fax function and remove from pre-op pool.  Please call with questions.  Dr. Tamala Julian - you can disregard the original message I routed you. Encounter will be closed.  Charlie Pitter, PA-C 09/11/2017, 3:27 PM

## 2017-09-12 DIAGNOSIS — E119 Type 2 diabetes mellitus without complications: Secondary | ICD-10-CM | POA: Diagnosis not present

## 2017-09-12 DIAGNOSIS — K810 Acute cholecystitis: Secondary | ICD-10-CM | POA: Diagnosis not present

## 2017-09-12 DIAGNOSIS — Z434 Encounter for attention to other artificial openings of digestive tract: Secondary | ICD-10-CM | POA: Diagnosis not present

## 2017-09-12 DIAGNOSIS — I10 Essential (primary) hypertension: Secondary | ICD-10-CM | POA: Diagnosis not present

## 2017-09-12 DIAGNOSIS — I69351 Hemiplegia and hemiparesis following cerebral infarction affecting right dominant side: Secondary | ICD-10-CM | POA: Diagnosis not present

## 2017-09-12 DIAGNOSIS — I69320 Aphasia following cerebral infarction: Secondary | ICD-10-CM | POA: Diagnosis not present

## 2017-09-14 DIAGNOSIS — I10 Essential (primary) hypertension: Secondary | ICD-10-CM | POA: Diagnosis not present

## 2017-09-14 DIAGNOSIS — E119 Type 2 diabetes mellitus without complications: Secondary | ICD-10-CM | POA: Diagnosis not present

## 2017-09-14 DIAGNOSIS — Z434 Encounter for attention to other artificial openings of digestive tract: Secondary | ICD-10-CM | POA: Diagnosis not present

## 2017-09-14 DIAGNOSIS — I69351 Hemiplegia and hemiparesis following cerebral infarction affecting right dominant side: Secondary | ICD-10-CM | POA: Diagnosis not present

## 2017-09-14 DIAGNOSIS — K810 Acute cholecystitis: Secondary | ICD-10-CM | POA: Diagnosis not present

## 2017-09-14 DIAGNOSIS — I69320 Aphasia following cerebral infarction: Secondary | ICD-10-CM | POA: Diagnosis not present

## 2017-09-17 ENCOUNTER — Emergency Department (HOSPITAL_COMMUNITY): Payer: Medicare Other

## 2017-09-17 ENCOUNTER — Encounter (HOSPITAL_COMMUNITY): Payer: Self-pay

## 2017-09-17 ENCOUNTER — Inpatient Hospital Stay (HOSPITAL_COMMUNITY)
Admission: EM | Admit: 2017-09-17 | Discharge: 2017-09-20 | DRG: 069 | Disposition: A | Discharge status: Home Health | Payer: Medicare Other | Attending: Family Medicine | Admitting: Family Medicine

## 2017-09-17 ENCOUNTER — Other Ambulatory Visit: Payer: Self-pay

## 2017-09-17 DIAGNOSIS — F419 Anxiety disorder, unspecified: Secondary | ICD-10-CM | POA: Diagnosis present

## 2017-09-17 DIAGNOSIS — E119 Type 2 diabetes mellitus without complications: Secondary | ICD-10-CM | POA: Diagnosis present

## 2017-09-17 DIAGNOSIS — Z888 Allergy status to other drugs, medicaments and biological substances status: Secondary | ICD-10-CM

## 2017-09-17 DIAGNOSIS — G459 Transient cerebral ischemic attack, unspecified: Secondary | ICD-10-CM | POA: Diagnosis not present

## 2017-09-17 DIAGNOSIS — I6603 Occlusion and stenosis of bilateral middle cerebral arteries: Secondary | ICD-10-CM | POA: Diagnosis not present

## 2017-09-17 DIAGNOSIS — Z8673 Personal history of transient ischemic attack (TIA), and cerebral infarction without residual deficits: Secondary | ICD-10-CM | POA: Diagnosis not present

## 2017-09-17 DIAGNOSIS — R29708 NIHSS score 8: Secondary | ICD-10-CM | POA: Diagnosis present

## 2017-09-17 DIAGNOSIS — Z9012 Acquired absence of left breast and nipple: Secondary | ICD-10-CM | POA: Diagnosis not present

## 2017-09-17 DIAGNOSIS — Z7902 Long term (current) use of antithrombotics/antiplatelets: Secondary | ICD-10-CM

## 2017-09-17 DIAGNOSIS — Z833 Family history of diabetes mellitus: Secondary | ICD-10-CM | POA: Diagnosis not present

## 2017-09-17 DIAGNOSIS — Z6835 Body mass index (BMI) 35.0-35.9, adult: Secondary | ICD-10-CM

## 2017-09-17 DIAGNOSIS — Z87891 Personal history of nicotine dependence: Secondary | ICD-10-CM | POA: Diagnosis not present

## 2017-09-17 DIAGNOSIS — D509 Iron deficiency anemia, unspecified: Secondary | ICD-10-CM | POA: Diagnosis present

## 2017-09-17 DIAGNOSIS — R4701 Aphasia: Secondary | ICD-10-CM | POA: Diagnosis not present

## 2017-09-17 DIAGNOSIS — J432 Centrilobular emphysema: Secondary | ICD-10-CM | POA: Diagnosis present

## 2017-09-17 DIAGNOSIS — I639 Cerebral infarction, unspecified: Secondary | ICD-10-CM | POA: Diagnosis not present

## 2017-09-17 DIAGNOSIS — G8191 Hemiplegia, unspecified affecting right dominant side: Secondary | ICD-10-CM | POA: Diagnosis not present

## 2017-09-17 DIAGNOSIS — R29810 Facial weakness: Secondary | ICD-10-CM | POA: Diagnosis not present

## 2017-09-17 DIAGNOSIS — I672 Cerebral atherosclerosis: Secondary | ICD-10-CM | POA: Diagnosis present

## 2017-09-17 DIAGNOSIS — R4020 Unspecified coma: Secondary | ICD-10-CM | POA: Diagnosis not present

## 2017-09-17 DIAGNOSIS — K219 Gastro-esophageal reflux disease without esophagitis: Secondary | ICD-10-CM | POA: Diagnosis present

## 2017-09-17 DIAGNOSIS — I69320 Aphasia following cerebral infarction: Secondary | ICD-10-CM | POA: Diagnosis not present

## 2017-09-17 DIAGNOSIS — E669 Obesity, unspecified: Secondary | ICD-10-CM | POA: Diagnosis present

## 2017-09-17 DIAGNOSIS — I6523 Occlusion and stenosis of bilateral carotid arteries: Secondary | ICD-10-CM | POA: Diagnosis not present

## 2017-09-17 DIAGNOSIS — Z90722 Acquired absence of ovaries, bilateral: Secondary | ICD-10-CM | POA: Diagnosis not present

## 2017-09-17 DIAGNOSIS — E785 Hyperlipidemia, unspecified: Secondary | ICD-10-CM | POA: Diagnosis present

## 2017-09-17 DIAGNOSIS — R011 Cardiac murmur, unspecified: Secondary | ICD-10-CM | POA: Diagnosis present

## 2017-09-17 DIAGNOSIS — N393 Stress incontinence (female) (male): Secondary | ICD-10-CM | POA: Diagnosis present

## 2017-09-17 DIAGNOSIS — R4182 Altered mental status, unspecified: Secondary | ICD-10-CM | POA: Diagnosis not present

## 2017-09-17 DIAGNOSIS — I69351 Hemiplegia and hemiparesis following cerebral infarction affecting right dominant side: Secondary | ICD-10-CM

## 2017-09-17 DIAGNOSIS — M810 Age-related osteoporosis without current pathological fracture: Secondary | ICD-10-CM | POA: Diagnosis present

## 2017-09-17 DIAGNOSIS — R410 Disorientation, unspecified: Secondary | ICD-10-CM | POA: Diagnosis not present

## 2017-09-17 DIAGNOSIS — Z90710 Acquired absence of both cervix and uterus: Secondary | ICD-10-CM

## 2017-09-17 DIAGNOSIS — F329 Major depressive disorder, single episode, unspecified: Secondary | ICD-10-CM | POA: Diagnosis present

## 2017-09-17 DIAGNOSIS — Z800 Family history of malignant neoplasm of digestive organs: Secondary | ICD-10-CM

## 2017-09-17 DIAGNOSIS — Z8249 Family history of ischemic heart disease and other diseases of the circulatory system: Secondary | ICD-10-CM

## 2017-09-17 DIAGNOSIS — R479 Unspecified speech disturbances: Secondary | ICD-10-CM | POA: Diagnosis not present

## 2017-09-17 DIAGNOSIS — Z8261 Family history of arthritis: Secondary | ICD-10-CM

## 2017-09-17 DIAGNOSIS — I700 Atherosclerosis of aorta: Secondary | ICD-10-CM | POA: Diagnosis present

## 2017-09-17 DIAGNOSIS — Z853 Personal history of malignant neoplasm of breast: Secondary | ICD-10-CM | POA: Diagnosis not present

## 2017-09-17 DIAGNOSIS — R4789 Other speech disturbances: Secondary | ICD-10-CM | POA: Diagnosis not present

## 2017-09-17 DIAGNOSIS — I340 Nonrheumatic mitral (valve) insufficiency: Secondary | ICD-10-CM | POA: Diagnosis not present

## 2017-09-17 DIAGNOSIS — I2510 Atherosclerotic heart disease of native coronary artery without angina pectoris: Secondary | ICD-10-CM | POA: Diagnosis present

## 2017-09-17 DIAGNOSIS — Z823 Family history of stroke: Secondary | ICD-10-CM

## 2017-09-17 DIAGNOSIS — Z79899 Other long term (current) drug therapy: Secondary | ICD-10-CM

## 2017-09-17 DIAGNOSIS — Z7982 Long term (current) use of aspirin: Secondary | ICD-10-CM

## 2017-09-17 DIAGNOSIS — I10 Essential (primary) hypertension: Secondary | ICD-10-CM | POA: Diagnosis not present

## 2017-09-17 DIAGNOSIS — I6602 Occlusion and stenosis of left middle cerebral artery: Secondary | ICD-10-CM | POA: Diagnosis present

## 2017-09-17 DIAGNOSIS — R9401 Abnormal electroencephalogram [EEG]: Secondary | ICD-10-CM | POA: Diagnosis present

## 2017-09-17 DIAGNOSIS — R531 Weakness: Secondary | ICD-10-CM | POA: Diagnosis not present

## 2017-09-17 LAB — COMPREHENSIVE METABOLIC PANEL
ALBUMIN: 3.3 g/dL — AB (ref 3.5–5.0)
ALK PHOS: 79 U/L (ref 38–126)
ALT: 14 U/L (ref 0–44)
AST: 19 U/L (ref 15–41)
Anion gap: 8 (ref 5–15)
BUN: 13 mg/dL (ref 8–23)
CALCIUM: 8.8 mg/dL — AB (ref 8.9–10.3)
CHLORIDE: 104 mmol/L (ref 98–111)
CO2: 27 mmol/L (ref 22–32)
CREATININE: 0.83 mg/dL (ref 0.44–1.00)
GFR calc Af Amer: >60 mL/min (ref >60)
GFR calc non Af Amer: >60 mL/min (ref >60)
GLUCOSE: 96 mg/dL (ref 70–99)
Potassium: 4.8 mmol/L (ref 3.5–5.1)
Sodium: 139 mmol/L (ref 135–145)
Total Bilirubin: 0.8 mg/dL (ref 0.3–1.2)
Total Protein: 6.5 g/dL (ref 6.5–8.1)

## 2017-09-17 LAB — URINALYSIS, ROUTINE W REFLEX MICROSCOPIC
BILIRUBIN URINE: NEGATIVE
Glucose, UA: NEGATIVE mg/dL
Hgb urine dipstick: NEGATIVE
KETONES UR: NEGATIVE mg/dL
LEUKOCYTES UA: NEGATIVE
NITRITE: NEGATIVE
Protein, ur: NEGATIVE mg/dL
Specific Gravity, Urine: 1.019 (ref 1.005–1.030)
pH: 5 (ref 5.0–8.0)

## 2017-09-17 LAB — CBC
HEMATOCRIT: 41.4 % (ref 36.0–46.0)
HEMOGLOBIN: 12.5 g/dL (ref 12.0–15.0)
MCH: 25.7 pg — ABNORMAL LOW (ref 26.0–34.0)
MCHC: 30.2 g/dL (ref 30.0–36.0)
MCV: 85.2 fL (ref 78.0–100.0)
Platelets: 199 10*3/uL (ref 150–400)
RBC: 4.86 MIL/uL (ref 3.87–5.11)
RDW: 16.8 % — ABNORMAL HIGH (ref 11.5–15.5)
WBC: 5.7 10*3/uL (ref 4.0–10.5)

## 2017-09-17 LAB — I-STAT TROPONIN, ED: Troponin i, poc: 0.01 ng/mL (ref 0.00–0.08)

## 2017-09-17 LAB — DIFFERENTIAL
ABS IMMATURE GRANULOCYTES: 0 10*3/uL (ref 0.0–0.1)
BASOS PCT: 0 %
Basophils Absolute: 0 10*3/uL (ref 0.0–0.1)
Eosinophils Absolute: 0.2 10*3/uL (ref 0.0–0.7)
Eosinophils Relative: 3 %
IMMATURE GRANULOCYTES: 0 %
LYMPHS PCT: 34 %
Lymphs Abs: 1.9 10*3/uL (ref 0.7–4.0)
Monocytes Absolute: 0.6 10*3/uL (ref 0.1–1.0)
Monocytes Relative: 10 %
NEUTROS ABS: 3 10*3/uL (ref 1.7–7.7)
NEUTROS PCT: 53 %

## 2017-09-17 LAB — APTT: APTT: 22 s — AB (ref 24–36)

## 2017-09-17 LAB — I-STAT CHEM 8, ED
BUN: 20 mg/dL (ref 8–23)
CHLORIDE: 104 mmol/L (ref 98–111)
Calcium, Ion: 1.02 mmol/L — ABNORMAL LOW (ref 1.15–1.40)
Creatinine, Ser: 0.8 mg/dL (ref 0.44–1.00)
Glucose, Bld: 94 mg/dL (ref 70–99)
HEMATOCRIT: 39 % (ref 36.0–46.0)
Hemoglobin: 13.3 g/dL (ref 12.0–15.0)
Potassium: 4.7 mmol/L (ref 3.5–5.1)
SODIUM: 138 mmol/L (ref 135–145)
TCO2: 29 mmol/L (ref 22–32)

## 2017-09-17 LAB — PROTIME-INR
INR: 0.9
Prothrombin Time: 12.1 seconds (ref 11.4–15.2)

## 2017-09-17 LAB — CBG MONITORING, ED: Glucose-Capillary: 89 mg/dL (ref 70–99)

## 2017-09-17 MED ORDER — BUPROPION HCL ER (SR) 150 MG PO TB12
150.0000 mg | ORAL_TABLET | Freq: Every day | ORAL | Status: DC
  Administered 2017-09-18 – 2017-09-20 (×3): 150 mg via ORAL
  Filled 2017-09-17 (×3): qty 1

## 2017-09-17 MED ORDER — ATORVASTATIN CALCIUM 10 MG PO TABS: 20.0000 mg | ORAL_TABLET | Freq: Every day | ORAL | Status: DC

## 2017-09-17 MED ORDER — ASPIRIN EC 81 MG PO TBEC: 81.0000 mg | DELAYED_RELEASE_TABLET | Freq: Every evening | ORAL | Status: DC

## 2017-09-17 MED ORDER — CLOPIDOGREL BISULFATE 75 MG PO TABS: 75.0000 mg | ORAL_TABLET | Freq: Every day | ORAL | Status: DC

## 2017-09-17 MED ORDER — LORAZEPAM 2 MG/ML IJ SOLN
1.0000 mg | Freq: Once | INTRAMUSCULAR | Status: AC
  Administered 2017-09-18: 1 mg via INTRAVENOUS
  Filled 2017-09-17: qty 1

## 2017-09-17 MED ORDER — ACETAMINOPHEN 650 MG RE SUPP
650.0000 mg | RECTAL | Status: DC | PRN
Start: 2017-09-17 — End: 2017-09-20

## 2017-09-17 MED ORDER — STROKE: EARLY STAGES OF RECOVERY BOOK
Freq: Once | Status: AC
  Administered 2017-09-17: 19:00:00
  Filled 2017-09-17: qty 1

## 2017-09-17 MED ORDER — SODIUM CHLORIDE 0.9 % IV SOLN
INTRAVENOUS | Status: DC
  Administered 2017-09-17 – 2017-09-19 (×4): via INTRAVENOUS

## 2017-09-17 MED ORDER — ACETAMINOPHEN 160 MG/5ML PO SOLN: 650.0000 mg | ORAL | Status: DC | PRN

## 2017-09-17 MED ORDER — PANTOPRAZOLE SODIUM 40 MG PO TBEC
40.0000 mg | DELAYED_RELEASE_TABLET | Freq: Every day | ORAL | Status: DC
  Administered 2017-09-18 – 2017-09-20 (×3): 40 mg via ORAL
  Filled 2017-09-17 (×3): qty 1

## 2017-09-17 MED ORDER — ACETAMINOPHEN 325 MG PO TABS: 650.0000 mg | ORAL_TABLET | ORAL | Status: DC | PRN

## 2017-09-17 MED ORDER — EZETIMIBE 10 MG PO TABS
10.0000 mg | ORAL_TABLET | Freq: Every day | ORAL | Status: DC
  Administered 2017-09-19 (×2): 10 mg via ORAL
  Filled 2017-09-17 (×2): qty 1

## 2017-09-17 MED ORDER — TIOTROPIUM BROMIDE MONOHYDRATE 18 MCG IN CAPS
18.0000 ug | ORAL_CAPSULE | Freq: Every day | RESPIRATORY_TRACT | Status: DC
  Administered 2017-09-19 – 2017-09-20 (×2): 18 ug via RESPIRATORY_TRACT
  Filled 2017-09-17: qty 5

## 2017-09-17 MED ORDER — CALCIUM CARBONATE-VITAMIN D 500-200 MG-UNIT PO TABS
1.0000 | ORAL_TABLET | Freq: Every evening | ORAL | Status: DC
  Administered 2017-09-19: 1 via ORAL
  Filled 2017-09-17 (×2): qty 1

## 2017-09-17 MED ORDER — ISOSORBIDE MONONITRATE ER 60 MG PO TB24
60.0000 mg | ORAL_TABLET | Freq: Every day | ORAL | Status: DC
Start: 2017-09-17 — End: 2017-09-20
  Administered 2017-09-18 – 2017-09-20 (×3): 60 mg via ORAL
  Filled 2017-09-17 (×3): qty 1

## 2017-09-17 MED ORDER — NITROGLYCERIN 0.4 MG SL SUBL: 0.4000 mg | SUBLINGUAL_TABLET | SUBLINGUAL | Status: DC | PRN

## 2017-09-17 MED ORDER — SENNOSIDES-DOCUSATE SODIUM 8.6-50 MG PO TABS: 1.0000 | ORAL_TABLET | Freq: Every evening | ORAL | Status: DC | PRN

## 2017-09-17 MED ORDER — SERTRALINE HCL 100 MG PO TABS
100.0000 mg | ORAL_TABLET | Freq: Every day | ORAL | Status: DC
  Administered 2017-09-18 – 2017-09-20 (×3): 100 mg via ORAL
  Filled 2017-09-17 (×3): qty 1

## 2017-09-17 NOTE — ED Notes (Signed)
Reminded pt for the need for urine

## 2017-09-17 NOTE — ED Notes (Signed)
Removed pt from bedpan, unable to defecate, will try again when needed.

## 2017-09-17 NOTE — ED Triage Notes (Signed)
Husband reported pt wife having difficulty speaking. Pt arrived via [facility] from home complaining of weakness and trouble speaking. LKW 11:15 am. Symptoms of dysphagia and weakness. Hx of stroke with deficits.

## 2017-09-17 NOTE — Code Documentation (Signed)
72 yo female coming from home with husband by [facility]. Pt was reported to have worsening sudden onset of aphasia. Pt has hx of CVA with right hemiparesis and aphasia. [facility] activated a Code Stroke and Stroke Team met patient at arrival. Initial NIHSS 8 due to chronic right arm weakness, chronic right leg weakness, slight facial droop, slight decrease in sensory on the left, and mild aphasia. Paramedics reported that her aphasia was getting better from initial encounter. Pt is on ASA and PLavix at home. Ambulates at baseline with assistance. CT negative, and patient's symptoms returning to baseline. No tPA due to too mild to treat. Pt is not a candidate for endovascular. Handoff given to [facility] and [place], Therapist, sports

## 2017-09-17 NOTE — ED Notes (Signed)
Report attempted 

## 2017-09-17 NOTE — H&P (Signed)
[facility] Admission History and Physical Service Pager: [phone]  Patient name: Brandy Paul Medical record number: [id number] Date of birth: 09-25-1945 Age: 72 y.o. Gender: female  Primary Care Provider: Velna Hatchet, MD Consultants: Neurology Code Status: Full code  Chief Complaint: Confusion and slurring of words  Assessment and Plan: Brandy Paul is a 72 y.o. female presenting with confusion and slurring of speech. PMH is significant for prior stroke with chronic R sided deficits, anxiety/depression, coronary spasm, T2DM, HLD  TIA- Aphasia was primary complaint starting ~11:15am, with potentially some mental confusion although no new focal muscular complaints per husband although patient claims to be diffusely weaker than baseline.  Called EMS and came to ED with aphasia resolving by time of exam by ED physician.  On chronic home ASA 81 and plavix 75.  No TPA given, CT neg, CXR neg.   No trauma noted, patient with stable vitals and no septic indications, no drug use in history or expected based on presentation. -admit to tele, Dr. Nori Riis Attending -neuro consulted, recs appreciated -MRI ordered (antianxiety meds authorized) -carotid US ordered -ECHO ordered -cont ASA 81 -cont plavix 75 -neuro checks Q2 -risk labs A1C/lipid/TSH -Pt/ot/slp eval -NS @100  -NPO pending speech eval -SCDs  Scheduled appendectomy/cholecystectomy 7/12: no current abdominal complaints, recent hx of gallbladder drain and appendicitis treated with abx -no current intervention indicated.  Coronary spasm- has imdur and nitro long term, has not used nitro in "I don't know how long when" -cont imdur and nitro   Anxiety/depression- stabel per family -cont home wellbutrin (started in last month) -cont home zoloft  T2DM- in history but CBGs normal, no DM meds on file -monitor daily BMP -A1c  HLD- chronic atorvastatin/zetia -home atorvastatin -home zetia -lipid  panel  Emphysema: stable on RA, home tiotoprium -home tiotoprium  Hx Prior CVA: On ASA and Plavix.  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  Requires husband's help with some ADL's including showering. - Continue ASA 81 - Continue Plavix  Acute Cholecystitis: Drain tube removed 6/10.  Scheduled for Cholecystectomy 7/12. - F/U Outpatient with surgeon  Appendicitis: Inpatient admission in February, treated with Abx only. Scheduled for Appendectomy with Cholecystectomy 7/12. - F/u outpatient with surgeon.  FEN/GI: NPO pending speech eval (sips with meds) protonix Prophylaxis: SCDs  Disposition: pending neuro workup  History of Present Illness:  Brandy Paul is a 72 y.o. female presenting with confusion, slurring of speech, and possible minimal facial changes per husband that started about 11:15 am after patient was bathed by husband.  She was noted to be asymptomatic prior to this time.  She does remember not being able to talk but was not confused, feels diffusely weak but no pain,  Right side is normally weaker but is a little weaker than normal today, no visual changes, no numbness/tingling, does not feel "shaky", no SOB, no recent fever/illness besides the appendicitis.  She requires some assistance with ADL's due to a prior stroke in April 2013 that left the patient with paralysis of her right arm and weakness in her right leg.    Did have recent galbladder with drain tube removed 6/10, has scheduled cholecystectomy/appendectomy on 7/12 In February had appendicitis, treated with abx only (4 day admission)  On plavix/aspirin at home, long term, no recent falls No palpitations/chest pain  Last BM today, no urinary complaints, no cough/hemoptosis, no recent weight changes, only recent mental status change was this morning.  Review Of Systems: Per HPI with the  following additions:   Review of Systems  HENT: Negative for ear pain and hearing loss.   Eyes: Negative for  photophobia and pain.  Respiratory: Negative for shortness of breath.   Cardiovascular: Negative for chest pain and palpitations.  Gastrointestinal: Negative for abdominal pain, nausea and vomiting.  Neurological: Positive for focal weakness and weakness. Negative for dizziness, seizures, loss of consciousness and headaches.  Psychiatric/Behavioral: Positive for depression.    Patient Active Problem List   Diagnosis Date Noted  . TIA (transient ischemic attack) 09/17/2017  . Acute cholecystitis 07/12/2017  . Appendicitis 05/10/2017  . DOE (dyspnea on exertion) 04/22/2017  . Anemia 12/02/2015  . Ovarian mass, left 11/04/2015  . Pelvic mass in female 11/04/2015  . Iron deficiency anemia 04/28/2015  . Nonspecific abnormal finding in stool contents 04/06/2015  . Anemia, iron deficiency 04/06/2015  . Encounter for long-term (current) use of antiplatelets/antithrombotics 04/06/2015  . Hyperlipidemia 11/24/2014  . Hemiparesis affecting right side as late effect of cerebrovascular accident ([place]) 11/24/2014  . Coronary artery spasm ([place]) 10/09/2013  . 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 DM (diabetes mellitus), type 2, uncontrolled with complications ([place]) 16/03/930  . Gastroesophageal reflux disease without esophagitis 10/09/2013  . Centrilobular emphysema ([place]) 10/09/2013    Past Medical History: Past Medical History:  Diagnosis Date  . Breast cancer ([place]) 2005   left  . COPD (chronic obstructive pulmonary disease) (HCC)    hx of tobacco use  . Coronary artery spasm ([place])   . Depression   . GERD (gastroesophageal reflux disease)   . Hyperlipemia   . IDA (iron deficiency anemia)    last iron infusion 4 months ago  . Osteoporosis   . 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 Prediabetes    hx of  . Rash    under right breast and groin line using nizoral ointment to q day per dermatology saw few weeks ago  . Stress incontinence wers depends  . Stroke [facility]) 2013   paralytic  syndrome of dominant right side  . Toe fracture, left     Past Surgical History: Past Surgical History:  Procedure Laterality Date  . ABDOMINAL HYSTERECTOMY     complete  . COLONOSCOPY WITH PROPOFOL N/A 12/03/2015   Procedure: COLONOSCOPY WITH PROPOFOL;  Surgeon: Mauri Pole, MD;  Location: [facility] END[facility]COPY;  Service: Endoscopy;  Laterality: N/A;  tba before procdcedure  . IR CHOLANGIOGRAM EXISTING TUBE  08/08/2017  . IR CHOLANGIOGRAM EXISTING TUBE  08/27/2017  . IR PERC CHOLECYSTOSTOMY  07/13/2017  . MASTECTOMY Left    1 lymph node removed  . RADIAL KERATOTOMY Bilateral   . ROBOTIC ASSISTED BILATERAL SALPINGO OOPHERECTOMY Bilateral 11/04/2015   Procedure: XI ROBOTIC ASSISTED BILATERAL SALPINGO OOPHORECTOMY;  Surgeon: Everitt Amber, MD;  Location: [facility] ORS;  Service: Gynecology;  Laterality: Bilateral;  . TONSILLECTOMY      Social History: Social History   Tobacco Use  . Smoking status: Former Smoker    Types: Cigarettes    Last attempt to quit: 02/17/1994    Years since quitting: 23.5  . Smokeless tobacco: Never Used  Substance Use Topics  . Alcohol use: No    Alcohol/week: 0.0 oz  . Drug use: No   Additional social history:   Please also refer to relevant sections of EMR.  Family History: Family History  Problem Relation Age of Onset  . CVA Mother   . Liver cancer Mother   . Heart attack Sister   . Stroke Maternal Grandfather   .  Hypertension Sister   . Diabetes Sister   . Arthritis Sister     Allergies and Medications: Allergies  Allergen Reactions  . Ceclor [Cefaclor] Other (See Comments)    REACTION: "SEVERE HEADACHE"   No current facility-administered medications on file prior to encounter.    Current Outpatient Medications on File Prior to Encounter  Medication Sig Dispense Refill  . acetaminophen (TYLENOL) 500 MG tablet Take 500 mg by mouth daily as needed for mild pain.     Marland Kitchen aspirin 81 MG tablet Take 81 mg by mouth every evening.     Marland Kitchen atorvastatin  (LIPITOR) 20 MG tablet Take 20 mg by mouth at bedtime.   4  . buPROPion (WELLBUTRIN SR) 150 MG 12 hr tablet Take 1 tablet by mouth daily.  3  . Calcium Carbonate-Vitamin D (OSCAL 500/200 D-3 PO) Take 1 tablet by mouth every evening.     . clopidogrel (PLAVIX) 75 MG tablet Take 1 tablet (75 mg total) by mouth at bedtime.    Marland Kitchen ezetimibe (ZETIA) 10 MG tablet Take 10 mg by mouth at bedtime.     . isosorbide mononitrate (IMDUR) 60 MG 24 hr tablet TAKE 1 TABLET BY MOUTH DAILY 90 tablet 3  . nitroGLYCERIN (NITROSTAT) 0.4 MG SL tablet Place 0.4 mg under the tongue every 5 (five) minutes as needed for chest pain (Call 911 at 3rd dose within 15 minutes).    . RABEprazole (ACIPHEX) 20 MG tablet Take 20 mg by mouth at bedtime.   2  . sertraline (ZOLOFT) 100 MG tablet Take 100 mg by mouth daily.  3  . tiotropium (SPIRIVA HANDIHALER) 18 MCG inhalation capsule Place 18 mcg into inhaler and inhale daily.       Objective: BP (!) 156/62 (BP Location: Right Arm)   Pulse 62   Temp 98.4 F (36.9 C) (Oral)   Resp 18   Ht 5' 3.5" (1.613 m)   Wt 203 lb 0.7 oz (92.1 kg)   SpO2 98%   BMI 35.40 kg/m  Exam: General: nervous, nontoxic, pleasant but slow with responses Eyes: PERRL, EOMI, clear sclera ENTM: mmm, uvula midline, no epistaxis/rhinorhea Neck: FROM Cardiovascular: RRR, no murmurs noted Respiratory: CTAB, no IWB, no wheezes/ crackles Gastrointestinal: no abdominal pain, no rebound/tenderness, no guarding MSK: very little movement of right arm which is chronic, 4/5 strength to right leg Derm: no lesiosn to exposed skin Neuro: CN exam significant for almost no movement to right arm (chronic) and 4/5 strenght on rigth leg (chronic), the remainder of CN exam was unremarkable for focal deficits but she did seem generally delayed in response Psych: pleasant, AO but slight delay  Labs and Imaging: CBC BMET  Recent Labs  Lab 09/17/17 1213 09/17/17 1216  WBC 5.7  --   HGB 12.5 13.3  HCT 41.4 39.0   PLT 199  --    Recent Labs  Lab 09/17/17 1213 09/17/17 1216  NA 139 138  K 4.8 4.7  CL 104 104  CO2 27  --   BUN 13 20  CREATININE 0.83 0.80  GLUCOSE 96 94  CALCIUM 8.8*  --      Dg Chest Portable 1 View  Result Date: 09/17/2017 CLINICAL DATA:  Altered mental status. EXAM: PORTABLE CHEST 1 VIEW COMPARISON:  08/22/2015 FINDINGS: The heart size and mediastinal contours are within normal limits. Both lungs are clear. The visualized skeletal structures are unremarkable. Calcification in the arch of the aorta. Surgical clips in the left axilla from previous breast  surgery. IMPRESSION: No acute abnormalities. Aortic Atherosclerosis (ICD10-I70.0). Electronically Signed   By: Lorriane Shire M.D.   On: 09/17/2017 13:24   Ct Head Code Stroke Wo Contrast  Result Date: 09/17/2017 CLINICAL DATA:  Code stroke.  Altered level of consciousness EXAM: CT HEAD WITHOUT CONTRAST TECHNIQUE: Contiguous axial images were obtained from the base of the skull through the vertex without intravenous contrast. COMPARISON:  None. FINDINGS: Brain: Ill-defined, chronic appearing remote infarcts in the bilateral basal ganglia and corona radiata, also involving the insular cortex on the left. Patchy small remote bilateral cerebellar infarcts. No evidence of acute infarct. No hemorrhage, hydrocephalus, or masslike finding Vascular: Atherosclerotic calcification.  No hyperdense vessel Skull: Negative Sinuses/Orbits: Postoperative globes.  No acute finding Other: These results were communicated to Dr. Cheral Marker at 12:25 pmon 7/1/2019by text page via the [facility] messaging system. ASPECTS [facility] Stroke Program Early CT Score) Not scored with this history IMPRESSION: 1. No acute finding. 2. Remote infarcts in the bilateral basal ganglia, left insula, and bilateral cerebellum. Electronically Signed   By: Monte Fantasia M.D.   On: 09/17/2017 12:25    Sherene Sires, DO 09/17/2017, 8:07 PM PGY-2, [place] Intern  pager: [phone], text pages welcome

## 2017-09-17 NOTE — Consult Note (Signed)
NEURO HOSPITALIST      Requesting Physician: Dr.Teegler    Chief Complaint:  Aphasia  History obtained from:  Patient /Chart review  HPI:                                                                                                                                         Brandy Paul is an 72 y.o. female PMH of Previous CVA 2013 (with residual deficits of right side hemiparesis and aphasia), HLD, Prediabetes who presented to [facility] ED as a code stroke for aphasia.   Patient was in her usual state of health this morning. Husband was helping her take a shower and get dressed when he noticed a sudden onset of aphasia. This was worse than her residual aphasia. Per EMS patient does walk at baseline with assitance, and needs assistance with ADLs. Currently takes aspirin and plavix. Patient back at baseline by the time she arrived to ED. Denies any vision problems, CP, Dizziness, abnormal weakness, slurred speech or HA.  ED course:  BP for EMS was SBP 168. BG was 94. CT head showed no acute findings. Remote infarcts in the bilateral basal ganglia, left insula and bilateral cerebellum.  Chart review of past stroke: Patient has a history of CVA in 2013 with residual right side hemiparesis and expressive aphasia.  Date last known well: Date: 09/17/2017 Time last known well: Time: 11:15 tPA Given: No: contraindicated;  mild symptoms, mostly residual deficits from previous CVA Modified Rankin: Rankin Score=2  NIHSS:8 1a Level of Conscious:0 1b LOC Questions: 0 1c LOC Commands: 0 2 Best Gaze:0  3 Visual: 0 4 Facial Palsy: 1 5a Motor Arm - left: 0 5b Motor Arm - Right: 4 6a Motor Leg - Left: 0 6b Motor Leg - Right: 1 7 Limb Ataxia: 0 8 Sensory: 1 9 Best Language: 1 10 Dysarthria:0 11 Extinct. and Inattention:0 TOTAL: 8  Past Medical History:  Diagnosis Date  . Breast cancer ([place]) 2005   left  . COPD (chronic obstructive pulmonary  disease) (HCC)    hx of tobacco use  . Coronary artery spasm ([place])   . Depression   . GERD (gastroesophageal reflux disease)   . Hyperlipemia   . IDA (iron deficiency anemia)    last iron infusion 4 months ago  . Osteoporosis   . 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 Prediabetes    hx of  . Rash    under right breast and groin line using nizoral ointment to q day per dermatology saw few weeks ago  . Stress incontinence wers depends  . Stroke [facility]) 2013  paralytic syndrome of dominant right side  . Toe fracture, left     Past Surgical History:  Procedure Laterality Date  . ABDOMINAL HYSTERECTOMY     complete  . COLONOSCOPY WITH PROPOFOL N/A 12/03/2015   Procedure: COLONOSCOPY WITH PROPOFOL;  Surgeon: Mauri Pole, MD;  [facility];  Service: Endoscopy;  Laterality: N/A;  tba before procdcedure  . IR CHOLANGIOGRAM EXISTING TUBE  08/08/2017  . IR CHOLANGIOGRAM EXISTING TUBE  08/27/2017  . IR PERC CHOLECYSTOSTOMY  07/13/2017  . MASTECTOMY Left    1 lymph node removed  . RADIAL KERATOTOMY Bilateral   . ROBOTIC ASSISTED BILATERAL SALPINGO OOPHERECTOMY Bilateral 11/04/2015   Procedure: XI ROBOTIC ASSISTED BILATERAL SALPINGO OOPHORECTOMY;  Surgeon: Everitt Amber, MD;  Location: [facility] ORS;  Service: Gynecology;  Laterality: Bilateral;  . TONSILLECTOMY      Family History  Problem Relation Age of Onset  . CVA Mother   . Liver cancer Mother   . Heart attack Sister   . Stroke Maternal Grandfather   . Hypertension Sister   . Diabetes Sister   . Arthritis Sister          Social History:  reports that she quit smoking about 23 years ago. Her smoking use included cigarettes. She has never used smokeless tobacco. She reports that she does not drink alcohol or use drugs.  Allergies:  Allergies  Allergen Reactions  . Ceclor [Cefaclor] Other (See Comments)    REACTION: "SEVERE HEADACHE"    Medications:                                                                                                                           No current facility-administered medications for this encounter.    Current Outpatient Medications  Medication Sig Dispense Refill  . acetaminophen (TYLENOL) 500 MG tablet Take 500 mg by mouth daily as needed for mild pain.     Marland Kitchen aspirin 81 MG tablet Take 81 mg by mouth every evening.     Marland Kitchen atorvastatin (LIPITOR) 20 MG tablet Take 20 mg by mouth at bedtime.   4  . buPROPion (WELLBUTRIN SR) 150 MG 12 hr tablet Take 1 tablet by mouth daily.  3  . Calcium Carbonate-Vitamin D (OSCAL 500/200 D-3 PO) Take 1 tablet by mouth every evening.     . clopidogrel (PLAVIX) 75 MG tablet Take 1 tablet (75 mg total) by mouth at bedtime.    Marland Kitchen ezetimibe (ZETIA) 10 MG tablet Take 10 mg by mouth at bedtime.     . isosorbide mononitrate (IMDUR) 60 MG 24 hr tablet TAKE 1 TABLET BY MOUTH DAILY 90 tablet 3  . nitroGLYCERIN (NITROSTAT) 0.4 MG SL tablet Place 0.4 mg under the tongue every 5 (five) minutes as needed for chest pain (Call 911 at 3rd dose within 15 minutes).    . RABEprazole (ACIPHEX) 20 MG tablet Take 20 mg by mouth at bedtime.   2  . sertraline (ZOLOFT) 25 MG tablet  Take 50 mg by mouth at bedtime.   3  . tiotropium (SPIRIVA HANDIHALER) 18 MCG inhalation capsule Place 18 mcg into inhaler and inhale daily.        ROS:                                                                                                                                       History obtained from the patient  General ROS: negative for - chills, fatigue, fever, night sweats, weight gain or weight loss Ophthalmic ROS: negative for - blurry vision, double vision, eye pain or loss of vision Respiratory ROS: negative for - cough,  shortness of breath or wheezing Cardiovascular ROS: negative for - chest pain, dyspnea on exertion,  Musculoskeletal ROS: negative for - joint swelling or muscular weakness Neurological ROS: as noted in HPI   General Examination:                                                                                                       Weight 92.1 kg (203 lb 0.7 oz).  HEENT-  Normocephalic, no lesions, without obvious abnormality.  Normal external eye and conjunctiva.  Cardiovascular-  pulses palpable throughout   Lungs- no excessive working breathing.  Saturations within normal limits on RA Extremities- Warm, dry and intact Musculoskeletal-no joint tenderness,  or swelling. Right arm contracted Skin-warm and dry, intact Neurological Examination  Mental Status: Alert, oriented, to person/place/time/event.  Can name a thumb, little finger, and pointer finger. Also can name the state of [place] and the [place]. Could not state her age. Mild expressive and receptive aphasia noted initially, followed by ability to name objects, fluent speech with no errors of syntax or grammar and intact comprehension.  Able to follow simple commands without difficulty. Had difficulty with a directional 3-step command. Cranial Nerves: II: Visual fields grossly normal. PERRL  III,IV, VI: ptosis not present, extra-ocular movements full bilaterally with saccadic pursuits noted. No nystagmus.   V,VII: smile asymmetric, slight facial droop on right. facial light touch sensation normal bilaterally VIII: hearing intact to voice IX,X: Palate rises symmetrically XI: No asynmmetry XII: tongue extension slightly to the right Motor: Right : Upper extremity   0/5  Left:     Upper extremity   4/5  Lower extremity   2-3/5  Lower extremity   4/5 Tone and bulk:increased tone in right arm, right arm contracted Sensory: Pinprick and light touch intact throughout, bilaterally Plantars: Right: [place]   Left:  downgoing Cerebellar: Normal finger-to-nose on the left, unable to move right arm, normal heel-to-shin test on the left, unable to perform on right side. Gait: deferred   Lab Results: Basic Metabolic Panel: No results for input(s): NA, K, CL, CO2, GLUCOSE, BUN, CREATININE,  CALCIUM, MG, PHOS in the last 168 hours.  CBC: No results for input(s): WBC, NEUTROABS, HGB, HCT, MCV, PLT in the last 168 hours.  Lipid Panel: No results for input(s): CHOL, TRIG, HDL, CHOLHDL, VLDL, LDLCALC in the last 168 hours.  CBG: Recent Labs  Lab 09/17/17 1208  GLUCAP 89    Imaging: Ct Head Code Stroke Wo Contrast  Result Date: 09/17/2017 CLINICAL DATA:  Code stroke.  Altered level of consciousness EXAM: CT HEAD WITHOUT CONTRAST TECHNIQUE: Contiguous axial images were obtained from the base of the skull through the vertex without intravenous contrast. COMPARISON:  None. FINDINGS: Brain: Ill-defined, chronic appearing remote infarcts in the bilateral basal ganglia and corona radiata, also involving the insular cortex on the left. Patchy small remote bilateral cerebellar infarcts. No evidence of acute infarct. No hemorrhage, hydrocephalus, or masslike finding Vascular: Atherosclerotic calcification.  No hyperdense vessel Skull: Negative Sinuses/Orbits: Postoperative globes.  No acute finding Other: These results were communicated to Dr. Cheral Marker at 12:25 pmon 7/1/2019by text page via the [facility] messaging system. ASPECTS [facility] Stroke Program Early CT Score) Not scored with this history IMPRESSION: 1. No acute finding. 2. Remote infarcts in the bilateral basal ganglia, left insula, and bilateral cerebellum. Electronically Signed   By: Monte Fantasia M.D.   On: 09/17/2017 12:25      History and examination documented by Laurey Morale, MSN, NP-C, [facility] [phone] 09/17/2017, 12:11 PM    Assessment: Brandy Paul is an 72 y.o. female PMH of Previous CVA 2013 (with residual deficits of right side hemiparesis and expressive aphasia), HLD, Prediabetes who presented to [facility] ED as a code stroke for aphasia.. CT head showed no acute finding; remote infarcts in the bilateral basal ganglia, left insula, and bilateral cerebellum were noted. Given her mild symptoms no TPA was  given, and status changed to TIA alert. Stroke Risk Factors - hyperlipidemia and previous CVA, age   Recommendations: --BP goal : Permissive HTN  --Continue ASA and Plavix per home regimen --Continue atorvastatin 20mg  --Telemetry monitoring --Frequent neuro checks --MRI brain, MRA head --PT/OT/Speech --Carotid ultrasound --TTE --please page stroke NP  Or  PA  Or MD from 8am -4 pm  as this patient from this time will be  followed by the stroke.   You can look them up on [email or web address]  Password [email or web address]   Electronically signed: Dr. Kerney Elbe

## 2017-09-17 NOTE — ED Provider Notes (Signed)
[facility] EMERGENCY DEPARTMENT Provider Note   CSN: [id number] Arrival date & time: 09/17/17  1205     History   Chief Complaint Chief Complaint  Patient presents with  . Code Stroke    HPI Brandy Paul is a 72 y.o. female.  The history is provided by the patient and medical records. No language interpreter was used.  Cerebrovascular Accident  This is a recurrent problem. The current episode started 1 to 2 hours ago. The problem has been gradually improving. Pertinent negatives include no chest pain, no abdominal pain, no headaches and no shortness of breath. Nothing aggravates the symptoms. Nothing relieves the symptoms. She has tried nothing for the symptoms. The treatment provided no relief.    Past Medical History:  Diagnosis Date  . Breast cancer ([place]) 2005   left  . COPD (chronic obstructive pulmonary disease) (HCC)    hx of tobacco use  . Coronary artery spasm ([place])   . Depression   . GERD (gastroesophageal reflux disease)   . Hyperlipemia   . IDA (iron deficiency anemia)    last iron infusion 4 months ago  . Osteoporosis   . 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 Prediabetes    hx of  . Rash    under right breast and groin line using nizoral ointment to q day per dermatology saw few weeks ago  . Stress incontinence wers depends  . Stroke [facility]) 2013   paralytic syndrome of dominant right side  . Toe fracture, left     Patient Active Problem List   Diagnosis Date Noted  . Abdominal pain 07/12/2017  . Acute cholecystitis 07/12/2017  . Appendicitis 05/10/2017  . DOE (dyspnea on exertion) 04/22/2017  . Anemia 12/02/2015  . Ovarian mass, left 11/04/2015  . Pelvic mass in female 11/04/2015  . Iron deficiency anemia 04/28/2015  . Nonspecific abnormal finding in stool contents 04/06/2015  . Anemia, iron deficiency 04/06/2015  . Encounter for long-term (current) use of antiplatelets/antithrombotics 04/06/2015  . Hyperlipidemia  11/24/2014  . Hemiparesis affecting right side as late effect of cerebrovascular accident ([place]) 11/24/2014  . Coronary artery spasm ([place]) 10/09/2013  . 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 DM (diabetes mellitus), type 2, uncontrolled with complications ([place]) 40/98/1191  . Gastroesophageal reflux disease without esophagitis 10/09/2013  . Centrilobular emphysema ([place]) 10/09/2013    Past Surgical History:  Procedure Laterality Date  . ABDOMINAL HYSTERECTOMY     complete  . COLONOSCOPY WITH PROPOFOL N/A 12/03/2015   Procedure: COLONOSCOPY WITH PROPOFOL;  Surgeon: Mauri Pole, MD;  Location: [facility] ENDOSCOPY;  Service: Endoscopy;  Laterality: N/A;  tba before procdcedure  . IR CHOLANGIOGRAM EXISTING TUBE  08/08/2017  . IR CHOLANGIOGRAM EXISTING TUBE  08/27/2017  . IR PERC CHOLECYSTOSTOMY  07/13/2017  . MASTECTOMY Left    1 lymph node removed  . RADIAL KERATOTOMY Bilateral   . ROBOTIC ASSISTED BILATERAL SALPINGO OOPHERECTOMY Bilateral 11/04/2015   Procedure: XI ROBOTIC ASSISTED BILATERAL SALPINGO OOPHORECTOMY;  Surgeon: Everitt Amber, MD;  Location: [facility] ORS;  Service: Gynecology;  Laterality: Bilateral;  . TONSILLECTOMY       OB History   None      Home Medications    Prior to Admission medications   Medication Sig Start Date End Date Taking? Authorizing Provider  acetaminophen (TYLENOL) 500 MG tablet Take 500 mg by mouth daily as needed for mild pain.     [provider]  aspirin 81 MG tablet Take 81  mg by mouth every evening.     [provider]  atorvastatin (LIPITOR) 20 MG tablet Take 20 mg by mouth at bedtime.  10/31/14   [provider]  buPROPion (WELLBUTRIN SR) 150 MG 12 hr tablet Take 1 tablet by mouth daily. 05/18/17   [provider]  Calcium Carbonate-Vitamin D (OSCAL 500/200 D-3 PO) Take 1 tablet by mouth every evening.     [provider]  clopidogrel (PLAVIX) 75 MG tablet Take 1 tablet (75 mg total) by mouth at bedtime. 11/07/15   Joylene John D, NP  ezetimibe (ZETIA) 10 MG tablet Take 10 mg by mouth at bedtime.     [provider]  isosorbide mononitrate (IMDUR) 60 MG 24 hr tablet TAKE 1 TABLET BY MOUTH DAILY 04/25/17   Belva Crome, MD  nitroGLYCERIN (NITROSTAT) 0.4 MG SL tablet Place 0.4 mg under the tongue every 5 (five) minutes as needed for chest pain (Call 911 at 3rd dose within 15 minutes).    [provider]  RABEprazole (ACIPHEX) 20 MG tablet Take 20 mg by mouth at bedtime.  10/29/14   [provider]  sertraline (ZOLOFT) 25 MG tablet Take 50 mg by mouth at bedtime.  11/17/14   [provider]  tiotropium (SPIRIVA HANDIHALER) 18 MCG inhalation capsule Place 18 mcg into inhaler and inhale daily.     [provider]    Family History Family History  Problem Relation Age of Onset  . CVA Mother   . Liver cancer Mother   . Heart attack Sister   . Stroke Maternal Grandfather   . Hypertension Sister   . Diabetes Sister   . Arthritis Sister     Social History Social History   Tobacco Use  . Smoking status: Former Smoker    Types: Cigarettes    Last attempt to quit: 02/17/1994    Years since quitting: 23.5  . Smokeless tobacco: Never Used  Substance Use Topics  . Alcohol use: No    Alcohol/week: 0.0 oz  . Drug use: No     Allergies   Ceclor [cefaclor]   Review of Systems Review of Systems  Constitutional: Negative for chills, fatigue and fever.  HENT: Negative for congestion, ear pain and sore throat.   Eyes: Negative for pain and visual disturbance.  Respiratory: Negative for cough, chest tightness, shortness of breath and wheezing.   Cardiovascular: Negative for chest pain and palpitations.  Gastrointestinal: Negative for abdominal pain, nausea and vomiting.  Genitourinary: Negative for dysuria, flank pain, frequency and hematuria.  Musculoskeletal: Negative for arthralgias, back pain, neck pain and neck stiffness.  Skin: Negative for color change and  rash.  Neurological: Positive for speech difficulty, weakness (at baseline) and numbness (at baseline). Negative for seizures, syncope and headaches.  All other systems reviewed and are negative.    Physical Exam Updated Vital Signs BP (!) 159/74   Pulse (!) 58   Temp 98.4 F (36.9 C) (Oral)   Resp 16   Ht 5' 3.5" (1.613 m)   Wt 92.1 kg (203 lb 0.7 oz)   SpO2 100%   BMI 35.40 kg/m   Physical Exam  Constitutional: She is oriented to person, place, and time. She appears well-developed and well-nourished. No distress.  HENT:  Head: Normocephalic and atraumatic.  Mouth/Throat: Oropharynx is clear and moist. No oropharyngeal exudate.  Eyes: Pupils are equal, round, and reactive to light. Conjunctivae and EOM are normal.  Neck: Neck supple.  Cardiovascular: Normal rate  and regular rhythm.  No murmur heard. Pulmonary/Chest: Effort normal and breath sounds normal. No respiratory distress. She has no wheezes. She has no rales. She exhibits no tenderness.  Abdominal: Soft. There is no tenderness. There is no guarding.  Musculoskeletal: She exhibits no edema.  Neurological: She is alert and oriented to person, place, and time. She is not disoriented. She displays no tremor. A sensory deficit is present. No cranial nerve deficit. She exhibits abnormal muscle tone.  Weakness and numbness in right arm.  Reportedly at baseline per husband.  Clear speech on my exam.  No facial droop.  Normal sensation in face and legs.  Normal strength in legs.  Skin: Skin is warm and dry. Capillary refill takes less than 2 seconds. No rash noted. She is not diaphoretic. No erythema.  Psychiatric: She has a normal mood and affect.  Nursing note and vitals reviewed.    ED Treatments / Results  Labs (all labs ordered are listed, but only abnormal results are displayed) Labs Reviewed  APTT - Abnormal; Notable for the following components:      Result Value   aPTT 22 (*)    All other components within  normal limits  CBC - Abnormal; Notable for the following components:   MCH 25.7 (*)    RDW 16.8 (*)    All other components within normal limits  COMPREHENSIVE METABOLIC PANEL - Abnormal; Notable for the following components:   Calcium 8.8 (*)    Albumin 3.3 (*)    All other components within normal limits  I-STAT CHEM 8, ED - Abnormal; Notable for the following components:   Calcium, Ion 1.02 (*)    All other components within normal limits  URINE CULTURE  PROTIME-INR  DIFFERENTIAL  URINALYSIS, ROUTINE W REFLEX MICROSCOPIC  I-STAT TROPONIN, ED  CBG MONITORING, ED    EKG EKG Interpretation  Date/Time:  Monday September 17 2017 12:23:10 EDT Ventricular Rate:  64 PR Interval:    QRS Duration: 106 QT Interval:  399 QTC Calculation: 412 R Axis:   42 Text Interpretation:  Sinus rhythm When compared to prior, no significant changes seen.  No STEMI Confirmed by Antony Blackbird [phone]) on 09/17/2017 3:04:42 PM   Radiology Dg Chest Portable 1 View  Result Date: 09/17/2017 CLINICAL DATA:  Altered mental status. EXAM: PORTABLE CHEST 1 VIEW COMPARISON:  08/22/2015 FINDINGS: The heart size and mediastinal contours are within normal limits. Both lungs are clear. The visualized skeletal structures are unremarkable. Calcification in the arch of the aorta. Surgical clips in the left axilla from previous breast surgery. IMPRESSION: No acute abnormalities. Aortic Atherosclerosis (ICD10-I70.0). Electronically Signed   By: Lorriane Shire M.D.   On: 09/17/2017 13:24   Ct Head Code Stroke Wo Contrast  Result Date: 09/17/2017 CLINICAL DATA:  Code stroke.  Altered level of consciousness EXAM: CT HEAD WITHOUT CONTRAST TECHNIQUE: Contiguous axial images were obtained from the base of the skull through the vertex without intravenous contrast. COMPARISON:  None. FINDINGS: Brain: Ill-defined, chronic appearing remote infarcts in the bilateral basal ganglia and corona radiata, also involving the insular cortex on the  left. Patchy small remote bilateral cerebellar infarcts. No evidence of acute infarct. No hemorrhage, hydrocephalus, or masslike finding Vascular: Atherosclerotic calcification.  No hyperdense vessel Skull: Negative Sinuses/Orbits: Postoperative globes.  No acute finding Other: These results were communicated to Dr. Cheral Marker at 12:25 pmon 7/1/2019by text page via the [facility] messaging system. ASPECTS [facility] Stroke Program Early CT Score) Not scored with this history IMPRESSION:  1. No acute finding. 2. Remote infarcts in the bilateral basal ganglia, left insula, and bilateral cerebellum. Electronically Signed   By: Monte Fantasia M.D.   On: 09/17/2017 12:25    Procedures Procedures (including critical care time)  Medications Ordered in ED Medications - No data to display   Initial Impression / Assessment and Plan / ED Course  I have reviewed the triage vital signs and the nursing notes.  Pertinent labs & imaging results that were available during my care of the patient were reviewed by me and considered in my medical decision making (see chart for details).     Brandy Paul is a 72 y.o. female with a past medical history significant for prior stroke, diabetes, emphysema, prior breast cancer, and osteoporosis who presents as a code stroke.  Patient is accompanied by her husband who reports that patient was noted to have speech abnormality today starting around 11:15 AM.  He described it as an expressive aphasia that has since resolved upon my initial evaluation.  Patient has baseline weakness in her right arm from the prior stroke.  Patient is on aspirin and Plavix for her prior stroke.  Family reports she is been doing well the last several days and had no preceding fevers, chills, chest pain, shortness of breath, or other symptoms.  According to patient, she was at her baseline this morning.   I saw the patient after her CT scan and after her initial evaluation by the neurology team.   On my  evaluation, patient has weakness in her right arm but has good strength in her right leg compared to left.  Patient had no facial droop and had clear speech for me.  Patient denied any double vision or blurry vision.  Patient had normal sensation throughout aside from her right arm which was slightly numb.  Lungs are clear and chest was nontender.  According to family she is at her baseline now.  Patient had a code stroke CT which did not show evidence of acute abnormality.  Remote strokes were seen.  As patient has had speech difficulty with her prior stroke, work-up was initiated to look for occult infection that might reactivate her symptoms.  Chest x-ray shows no pneumonia.  Urinalysis is in process.  On my reevaluation, patient is at her baseline according to husband.  Primary concern now is for TIA.  3:04 PM Anticipate following up on neurology recommendations to determine if patient requires admission with MRI or not.  Awaiting their note.   3:51 PM Spoke with neurology who recommended MRI and admission for TIA work-up.  Unassigned admission request placed.  Family medicine will come and admit patient.   Final Clinical Impressions(s) / ED Diagnoses   Final diagnoses:  Transient speech disturbance   Clinical Impression: 1. Transient speech disturbance     Disposition: Admit  This note was prepared with assistance of Dragon voice recognition software. Occasional wrong-word or sound-a-like substitutions may have occurred due to the inherent limitations of voice recognition software.      Zenna Traister, Gwenyth Allegra, MD 09/17/17 [phone]

## 2017-09-17 NOTE — Progress Notes (Signed)
Patient received from ED via stretcher; husband and daughter at bedside; oriented to room and unit routine; bed alarm on and bed low and locked.

## 2017-09-18 ENCOUNTER — Encounter (HOSPITAL_COMMUNITY): Payer: Self-pay | Admitting: *Deleted

## 2017-09-18 ENCOUNTER — Inpatient Hospital Stay (HOSPITAL_COMMUNITY): Payer: Medicare Other

## 2017-09-18 DIAGNOSIS — G459 Transient cerebral ischemic attack, unspecified: Principal | ICD-10-CM

## 2017-09-18 DIAGNOSIS — R479 Unspecified speech disturbances: Secondary | ICD-10-CM

## 2017-09-18 DIAGNOSIS — I10 Essential (primary) hypertension: Secondary | ICD-10-CM

## 2017-09-18 DIAGNOSIS — I340 Nonrheumatic mitral (valve) insufficiency: Secondary | ICD-10-CM

## 2017-09-18 DIAGNOSIS — I6603 Occlusion and stenosis of bilateral middle cerebral arteries: Secondary | ICD-10-CM

## 2017-09-18 DIAGNOSIS — Z8673 Personal history of transient ischemic attack (TIA), and cerebral infarction without residual deficits: Secondary | ICD-10-CM

## 2017-09-18 DIAGNOSIS — I69351 Hemiplegia and hemiparesis following cerebral infarction affecting right dominant side: Secondary | ICD-10-CM

## 2017-09-18 LAB — CBC
HEMATOCRIT: 38 % (ref 36.0–46.0)
Hemoglobin: 11.5 g/dL — ABNORMAL LOW (ref 12.0–15.0)
MCH: 25.7 pg — AB (ref 26.0–34.0)
MCHC: 30.3 g/dL (ref 30.0–36.0)
MCV: 85 fL (ref 78.0–100.0)
Platelets: 173 10*3/uL (ref 150–400)
RBC: 4.47 MIL/uL (ref 3.87–5.11)
RDW: 16.9 % — ABNORMAL HIGH (ref 11.5–15.5)
WBC: 4.9 10*3/uL (ref 4.0–10.5)

## 2017-09-18 LAB — LIPID PANEL
CHOLESTEROL: 139 mg/dL (ref 0–200)
HDL: 34 mg/dL — AB (ref >40)
LDL CALC: 75 mg/dL (ref 0–99)
TRIGLYCERIDES: 150 mg/dL — AB (ref <150)
Total CHOL/HDL Ratio: 4.1 RATIO
VLDL: 30 mg/dL (ref 0–40)

## 2017-09-18 LAB — BASIC METABOLIC PANEL
ANION GAP: 6 (ref 5–15)
BUN: 11 mg/dL (ref 8–23)
CALCIUM: 8.3 mg/dL — AB (ref 8.9–10.3)
CO2: 27 mmol/L (ref 22–32)
Chloride: 106 mmol/L (ref 98–111)
Creatinine, Ser: 0.77 mg/dL (ref 0.44–1.00)
GFR calc non Af Amer: >60 mL/min (ref >60)
GLUCOSE: 87 mg/dL (ref 70–99)
POTASSIUM: 3.8 mmol/L (ref 3.5–5.1)
Sodium: 139 mmol/L (ref 135–145)

## 2017-09-18 LAB — URINE CULTURE: Culture: <10000 — AB

## 2017-09-18 LAB — HEMOGLOBIN A1C
HEMOGLOBIN A1C: 6 % — AB (ref 4.8–5.6)
Mean Plasma Glucose: 125.5 mg/dL

## 2017-09-18 LAB — TSH: TSH: 1.815 u[IU]/mL (ref 0.350–4.500)

## 2017-09-18 MED ORDER — IOPAMIDOL (ISOVUE-370) INJECTION 76%
INTRAVENOUS | Status: AC
  Filled 2017-09-18: qty 50

## 2017-09-18 MED ORDER — ATORVASTATIN CALCIUM 80 MG PO TABS: 80.0000 mg | ORAL_TABLET | Freq: Every day | ORAL | Status: DC

## 2017-09-18 MED ORDER — SODIUM CHLORIDE 0.9 % IV BOLUS
1000.0000 mL | Freq: Once | INTRAVENOUS | Status: AC
  Administered 2017-09-18: 1000 mL via INTRAVENOUS

## 2017-09-18 MED ORDER — IOPAMIDOL (ISOVUE-370) INJECTION 76%
50.0000 mL | Freq: Once | INTRAVENOUS | Status: AC
  Administered 2017-09-18: 50 mL via INTRAVENOUS

## 2017-09-18 MED ORDER — ASPIRIN EC 81 MG PO TBEC
81.0000 mg | DELAYED_RELEASE_TABLET | Freq: Once | ORAL | Status: AC
  Administered 2017-09-18: 81 mg via ORAL
  Filled 2017-09-18: qty 1

## 2017-09-18 MED ORDER — CLOPIDOGREL BISULFATE 75 MG PO TABS
75.0000 mg | ORAL_TABLET | Freq: Once | ORAL | Status: AC
  Administered 2017-09-18: 75 mg via ORAL
  Filled 2017-09-18: qty 1

## 2017-09-18 MED ORDER — ATORVASTATIN CALCIUM 40 MG PO TABS
40.0000 mg | ORAL_TABLET | Freq: Every day | ORAL | Status: DC
  Administered 2017-09-19 (×2): 40 mg via ORAL
  Filled 2017-09-18 (×2): qty 1

## 2017-09-18 MED ORDER — ALBUMIN HUMAN 5 % IV SOLN
12.5000 g | Freq: Four times a day (QID) | INTRAVENOUS | Status: AC
  Administered 2017-09-18 – 2017-09-19 (×4): 12.5 g via INTRAVENOUS
  Filled 2017-09-18 (×4): qty 250

## 2017-09-18 NOTE — Evaluation (Signed)
Speech Language Pathology Evaluation Patient Details Name: SAMAN GIDDENS MRN: [id number] DOB: 1945-05-31 Today's Date: 09/18/2017 Time: 1050-1120 SLP Time Calculation (min) (ACUTE ONLY): 30 min  Problem List:  Patient Active Problem List   Diagnosis Date Noted  . TIA (transient ischemic attack) 09/17/2017  . Acute cholecystitis 07/12/2017  . Appendicitis 05/10/2017  . DOE (dyspnea on exertion) 04/22/2017  . Anemia 12/02/2015  . Ovarian mass, left 11/04/2015  . Pelvic mass in female 11/04/2015  . Iron deficiency anemia 04/28/2015  . Nonspecific abnormal finding in stool contents 04/06/2015  . Anemia, iron deficiency 04/06/2015  . Encounter for long-term (current) use of antiplatelets/antithrombotics 04/06/2015  . Hyperlipidemia 11/24/2014  . Hemiparesis affecting right side as late effect of cerebrovascular accident ([place]) 11/24/2014  . Coronary artery spasm ([place]) 10/09/2013  . 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 DM (diabetes mellitus), type 2, uncontrolled with complications ([place]) 89/21/1941  . Gastroesophageal reflux disease without esophagitis 10/09/2013  . Centrilobular emphysema ([place]) 10/09/2013   Past Medical History:  Past Medical History:  Diagnosis Date  . Breast cancer ([place]) 2005   left  . COPD (chronic obstructive pulmonary disease) (HCC)    hx of tobacco use  . Coronary artery spasm ([place])   . Depression   . GERD (gastroesophageal reflux disease)   . Hyperlipemia   . IDA (iron deficiency anemia)    last iron infusion 4 months ago  . Osteoporosis   . 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 Prediabetes    hx of  . Rash    under right breast and groin line using nizoral ointment to q day per dermatology saw few weeks ago  . Stress incontinence wers depends  . Stroke [facility]) 2013   paralytic syndrome of dominant right side  . Toe fracture, left    Past Surgical History:  Past Surgical History:  Procedure Laterality Date  . ABDOMINAL HYSTERECTOMY     complete   . COLONOSCOPY WITH PROPOFOL N/A 12/03/2015   Procedure: COLONOSCOPY WITH PROPOFOL;  Surgeon: Mauri Pole, MD;  Location: [facility];  Service: Endoscopy;  Laterality: N/A;  tba before procdcedure  . IR CHOLANGIOGRAM EXISTING TUBE  08/08/2017  . IR CHOLANGIOGRAM EXISTING TUBE  08/27/2017  . IR PERC CHOLECYSTOSTOMY  07/13/2017  . MASTECTOMY Left    1 lymph node removed  . RADIAL KERATOTOMY Bilateral   . ROBOTIC ASSISTED BILATERAL SALPINGO OOPHERECTOMY Bilateral 11/04/2015   Procedure: XI ROBOTIC ASSISTED BILATERAL SALPINGO OOPHORECTOMY;  Surgeon: Everitt Amber, MD;  Location: [facility]S;  Service: Gynecology;  Laterality: Bilateral;  . TONSILLECTOMY     HPI:  72 year old female admitted 09/17/17 with TIA symptoms. PMH: Left CVA 2015 with residual mild expressive aphasia. MRI = no acute abnormality, multiple old infarcts, generalized atrophy   Assessment / Plan / Recommendation Clinical Impression  Pt presents with functional receptive language skills, and mild expressive aphasia characterized by high level word retrieval deficits. Pt indicates this is baseline level of function following 2015 CVA. Mild right facial weakness also noted. No further ST intervention is recommended at this time.    SLP Assessment  SLP Recommendation/Assessment: Patient does not need any further Speech Language Pathology Services SLP Visit Diagnosis: Aphasia (R47.01)    Follow Up Recommendations  None - pt at baseline level of function         SLP Evaluation Cognition  Overall Cognitive Status: Difficult to assess(expressive aphasia)       Comprehension  Auditory Comprehension Overall Auditory  Comprehension: Appears within functional limits for tasks assessed Yes/No Questions: Within Functional Limits Commands: Within Functional Limits Conversation: Simple    Expression Expression Primary Mode of Expression: Verbal Verbal Expression Overall Verbal Expression: Impaired Initiation: No  impairment Automatic Speech: Name;Social Response;Counting;Day of week;Month of year Level of Generative/Spontaneous Verbalization: Phrase Repetition: No impairment Naming: Impairment Responsive: 76-100% accurate Confrontation: Within functional limits(common objects) Convergent: 75-100% accurate Divergent: 75-100% accurate Verbal Errors: Phonemic paraphasias Pragmatics: No impairment Effective Techniques: Semantic cues Non-Verbal Means of Communication: Not applicable Written Expression Dominant Hand: Left(right handed prior to 2013 CVA) Written Expression: Not tested   Oral / Motor  Oral Motor/Sensory Function Overall Oral Motor/Sensory Function: Mild impairment(generalized mild right weakness) Motor Speech Overall Motor Speech: Appears within functional limits for tasks assessed Intelligibility: Intelligible Motor Planning: Witnin functional limits   GO                   Lola Lofaro B. Quentin [facility], CCC-SLP Speech Language Pathologist [phone]  Shonna Chock 09/18/2017, 11:52 AM

## 2017-09-18 NOTE — Procedures (Signed)
ELECTROENCEPHALOGRAM REPORT  Date of Study: 09/18/2017  Patient's Name: Brandy Paul MRN: [id number] Date of Birth: 07/24/1945  Referring Provider: Burnetta Sabin, NP  Clinical History: This is a 72 year old woman with worsened aphasia  Medications: No AEDs or sedating medications listed  Technical Summary: A multichannel digital EEG recording measured by the international 10-20 system with electrodes applied with paste and impedances below 5000 ohms performed as portable with EKG monitoring in an awake and drowsy patient.  Hyperventilation and photic stimulation were not performed.  The digital EEG was referentially recorded, reformatted, and digitally filtered in a variety of bipolar and referential montages for optimal display.   Description: The patient is awake and drowsy during the recording.  During maximal wakefulness, there is a poorly sustained symmetric, medium voltage 7 Hz posterior dominant rhythm that attenuates with eye opening. The record is symmetric. During drowsiness and sleep, there is an increase in theta and delta slowing of the background. Deeper stages of sleep were not seen. Hyperventilation and photic stimulation were not performed.  There were no epileptiform discharges or electrographic seizures seen.    EKG lead was unremarkable.  Impression: This awake and drowsy EEG is abnormal due to slowing of the posterior dominant rhythm.  Clinical Correlation of the above findings indicates diffuse cerebral dysfunction that is non-specific in etiology and can be seen with hypoxic/ischemic injury, toxic/metabolic encephalopathies, neurodegenerative disorders, or medication effect.  The absence of epileptiform discharges does not rule out a clinical diagnosis of epilepsy.  Clinical correlation is advised.   Ellouise Newer, M.D.

## 2017-09-18 NOTE — Progress Notes (Signed)
RN reported acute neuro change. RN stated that that she got handover from previous RN at 4:30pm that pt has been at baseline. However, pt was sleeping at that time and it was not clear when was the LSW time. However, on waken up, pt had expressive aphasia, hesitant with speech and word finding difficulty. Able to name 1/3 and not able to repeat, consistent with expressive aphasia. Pt following with simple commands. LUE and LLE as well RLE strength no significant change. LUE still flaccid.   RN stated that husband left around 4pm and I tried to call husband on the cell and home number, no response. I saw this pt around lunch time, she has paucity of speech but able to name but still has mild expressive aphasia. However, as per husband, pt seldomly has aphasia at home, this made me wonder whether pt already had new deficit from baseline at that time. MRI today 12am showed no acute infarct. Of note, pt BP was relatively low in the pm, from 140-160s early today to 120-130 in the afternoon. With pt left M1 severe stenosis and decreased branches, concerning for hypoperfusion. Will put bed flat and give IVF bolus.

## 2017-09-18 NOTE — Evaluation (Signed)
Clinical/Bedside Swallow Evaluation Patient Details  Name: Brandy Paul MRN: [id number] Date of Birth: February 14, 1946  Today's Date: 09/18/2017 Time: SLP Start Time (ACUTE ONLY): 1120 SLP Stop Time (ACUTE ONLY): 1140 SLP Time Calculation (min) (ACUTE ONLY): 20 min  Past Medical History:  Past Medical History:  Diagnosis Date  . Breast cancer ([place]) 2005   left  . COPD (chronic obstructive pulmonary disease) ([facility])    hx of tobacco use  . Coronary artery spasm ([place])   . Depression   . GERD (gastroesophageal reflux disease)   . Hyperlipemia   . IDA (iron deficiency anemia)    last iron infusion 4 months ago  . Osteoporosis   . 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 Prediabetes    hx of  . Rash    under right breast and groin line using nizoral ointment to q day per dermatology saw few weeks ago  . Stress incontinence wers depends  . Stroke [facility]) 2013   paralytic syndrome of dominant right side  . Toe fracture, left    Past Surgical History:  Past Surgical History:  Procedure Laterality Date  . ABDOMINAL HYSTERECTOMY     complete  . COLONOSCOPY WITH PROPOFOL N/A 12/03/2015   Procedure: COLONOSCOPY WITH PROPOFOL;  Surgeon: Mauri Pole, MD;  Location: [facility];  Service: Endoscopy;  Laterality: N/A;  tba before procdcedure  . IR CHOLANGIOGRAM EXISTING TUBE  08/08/2017  . IR CHOLANGIOGRAM EXISTING TUBE  08/27/2017  . IR PERC CHOLECYSTOSTOMY  07/13/2017  . MASTECTOMY Left    1 lymph node removed  . RADIAL KERATOTOMY Bilateral   . ROBOTIC ASSISTED BILATERAL SALPINGO OOPHERECTOMY Bilateral 11/04/2015   Procedure: XI ROBOTIC ASSISTED BILATERAL SALPINGO OOPHORECTOMY;  Surgeon: Everitt Amber, MD;  Location: [facility] ORS;  Service: Gynecology;  Laterality: Bilateral;  . TONSILLECTOMY     HPI:  72 year old female admitted 09/17/17 with TIA symptoms. 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 MRI = no acute abnormality, multiple old infarcts, generalized atrophy    Assessment / Plan / Recommendation Clinical Impression  Pt presents with mild right facial weakness, which she reports is baseline. Speech is intelligible. Some missing dentition noted, and pt indicates partials are at home. Pt reports tolerance of regular solids and thin liquids at home, but requires assistance to cut up solids and open containers due to RUE not functional. Pt tolerated thin liquids, puree, and solid trials wtihout overt s/s aspiration. Will begin chopped solids with thin liquids and follow for diet tolerance. RN informed of results and recommendations.    SLP Visit Diagnosis: Dysphagia, unspecified (R13.10)    Aspiration Risk  Mild aspiration risk    Diet Recommendation Regular;Thin liquid(chop meats)   Liquid Administration via: Straw;Cup Medication Administration: Whole meds with liquid Supervision: Patient able to self feed;Intermittent supervision to cue for compensatory strategies Compensations: Minimize environmental distractions;Slow rate;Small sips/bites Postural Changes: Seated upright at 90 degrees    Other  Recommendations Oral Care Recommendations: Oral care BID   Follow up Recommendations None      Frequency and Duration min 1 x/week  1 week       Prognosis Prognosis for Safe Diet Advancement: Good      Swallow Study   General Date of Onset: 09/17/17 HPI: 72 year old female admitted 09/17/17 with TIA symptoms. 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 MRI = no acute abnormality, multiple old infarcts, generalized atrophy Type of Study: Bedside Swallow Evaluation Previous Swallow  Assessment: no prior ST intervention Diet Prior to this Study: NPO Temperature Spikes Noted: No Respiratory Status: Room air History of Recent Intubation: No Behavior/Cognition: Alert;Cooperative;Pleasant mood Oral Cavity Assessment: Within Functional Limits Oral Care Completed by SLP: No Oral Cavity - Dentition: Missing dentition Vision: Functional  for self-feeding Self-Feeding Abilities: Able to feed self(RUE flaccid) Patient Positioning: Upright in chair Baseline Vocal Quality: Normal Volitional Cough: Strong Volitional Swallow: Able to elicit    Oral/Motor/Sensory Function Overall Oral Motor/Sensory Function: (mild generalized weakness on the right)   Ice Chips Ice chips: Not tested   Thin Liquid Thin Liquid: Within functional limits Presentation: Cup;Straw    Nectar Thick Nectar Thick Liquid: Not tested   Honey Thick Honey Thick Liquid: Not tested   Puree Puree: Within functional limits Presentation: Self Fed   Solid   GO   Solid: Within functional limits Presentation: [facility], [facility], [place] Speech Language Pathologist [phone]  Shonna Chock 09/18/2017,11:58 AM

## 2017-09-18 NOTE — Progress Notes (Signed)
OT Note - Addendum    09/18/17 1354  OT Visit Information  Last OT Received On 09/18/17  OT Time Calculation  OT Start Time (ACUTE ONLY) 1207  OT Stop Time (ACUTE ONLY) 1237  OT Time Calculation (min) 30 min  OT General Charges  $OT Visit 1 Visit  OT Evaluation  $OT Eval Moderate Complexity 1 Mod  OT Treatments  $Self Care/Home Management  8-22 mins  Maurie Boettcher, OT/L  OT Clinical Specialist [phone]

## 2017-09-18 NOTE — Progress Notes (Addendum)
Pt back to room. CT no acute changes. No bleeding. Not candidate for tPA given no clear LSW and the worsening symptoms most likely due to hypoperfusion. BP at 120s. Put bed flat and give IV bolus 1L. Pt symptoms gradually improved. Able to repeat and name but slow and occasional paraphasic errors. Will keep bed rest and head of bed flat over night. Continue IVF @ 75cc/h after 1L bolus. Goal SBP 140s. Will give albumin too. Tried to call husband on his cell phone again, no response.   Rosalin Hawking, MD PhD Stroke Neurology 09/18/2017 6:59 PM

## 2017-09-18 NOTE — Progress Notes (Signed)
Came into room to wake patient up to do initial shift assessment. Patient was unable to answer any of my questions. She can not tell me her name or where she is. Speech is very garbled and slurred. Dr. Erlinda Hong notified and he ordered stat head CT. He will come to see the patient.

## 2017-09-18 NOTE — Care Management Note (Signed)
Case Management Note  Patient Details  Name: Brandy Paul MRN: [id number] Date of Birth: 1946-02-08  Subjective/Objective:       Pt in with TIA. She is from home with spouse.             Action/Plan: Awaiting PT/OT evals. CM following for d/c needs, physician orders.   Expected Discharge Date:                  Expected Discharge Plan:     In-House Referral:     Discharge planning Services     Post Acute Care Choice:    Choice offered to:     DME Arranged:    DME Agency:     HH Arranged:    HH Agency:     Status of Service:  In process, will continue to follow  If discussed at Long Length of Stay Meetings, dates discussed:    Additional Comments:  Pollie Friar, RN 09/18/2017, 11:27 AM

## 2017-09-18 NOTE — Progress Notes (Signed)
EEG completed, results pending. 

## 2017-09-18 NOTE — Progress Notes (Signed)
SLP Cancellation Note  Patient Details Name: Brandy Paul MRN: [id number] DOB: Jan 01, 1946   Cancelled treatment:       Reason Eval/Treat Not Completed: Patient at procedure or test/unavailable. Pt currently off unit for testing. Will continue efforts.  [facility], CCC-SLP Speech Language Pathologist [phone]  Shonna Chock 09/18/2017, 9:30 AM

## 2017-09-18 NOTE — Progress Notes (Signed)
Occupational Therapy Evaluation Patient Details Name: Brandy Paul MRN: [id number] DOB: Apr 18, 1945 Today's Date: 09/18/2017    History of Present Illness Patient is a 72 y/o female admitted with confusion and slurred speech. CT revealing no acute intracranial abnormalities, MRI revealing no acute abnormality but with multiple old infarcts. Patient with a PMH significant for prior stroke with chronic R sided deficits, anxiety/depression, coronary spasm, T2DM, HLD.   Clinical Impression   PTA, pt was living at home with her husband and daughter, and required some assistance with ADL's due to residual RUE paralysis and weakness in her RLE from a prior stroke. Pt currently requires modA for functional mobility and LB ADLs. No family/caregiver was present to determine pt's PLOF, but anticipate pt is either at or close to functioning at baseline. Due to deficits listed below (see OT problem list), pt would benefit from acute OT to address pt's hand contracture and caregiver education to facilitate safe D/C home. At this time, recommend HHOT follow-up.      Follow Up Recommendations  Home health OT;Supervision/Assistance - 24 hour    Equipment Recommendations       Recommendations for Other Services PT consult     Precautions / Restrictions Precautions Precautions: Fall Restrictions Weight Bearing Restrictions: No      Mobility Bed Mobility Overal bed mobility: Needs Assistance Bed Mobility: Supine to Sit     Supine to sit: Mod assist;HOB elevated     General bed mobility comments: pt able to initiate moving LE off bed, A required to pivot hips to position EOB and advance trunk to midline  Transfers Overall transfer level: Needs assistance Equipment used: Quad cane Transfers: Sit to/from [facility] Sit to Stand: Mod assist;From elevated surface Stand pivot transfers: Mod assist       General transfer comment: pt required modA to stabilize/facilitate  engagement of RLE during sit<>stand;pt buckled during stand-pivot transfer, required physical assist to prevent buckling;difficult to understand pt but anticpicate pt's husband lifts pt up off chair    Balance Overall balance assessment: Needs assistance Sitting-balance support: No upper extremity supported;Feet supported Sitting balance-Leahy Scale: Good     Standing balance support: Single extremity supported Standing balance-Leahy Scale: Fair Standing balance comment: modA for stability with static standing;knee buckled during stand-pivot                           ADL either performed or assessed with clinical judgement   ADL Overall ADL's : Needs assistance/impaired Eating/Feeding: Set up   Grooming: Minimal assistance   Upper Body Bathing: Minimal assistance Upper Body Bathing Details (indicate cue type and reason): unable to use RUE to wash LUE Lower Body Bathing: Minimal assistance   Upper Body Dressing : Minimal assistance   Lower Body Dressing: Moderate assistance   Toilet Transfer: Moderate assistance;Stand-pivot Toilet Transfer Details (indicate cue type and reason): modA for simulated transfer from EOB from elevated surface to recliner Toileting- Clothing Manipulation and Hygiene: Moderate assistance       Functional mobility during ADLs: Moderate assistance General ADL Comments: pt would benefit from AE to assist with ADLs;pt reports she does not feel she is at baseline functioning     Vision Baseline Vision/History: Wears glasses Wears Glasses: At all times       Perception     Praxis      Pertinent Vitals/Pain Pain Assessment: No/denies pain     Hand Dominance Left   Extremity/Trunk Assessment Upper Extremity  Assessment Upper Extremity Assessment:RUE deficits/detail RUE Deficits / Details:pt's R hand contracted, facilitated extension of digits to clean palm and between digits, MCPs have about a 20* lag, digits 2-5 PIP/DIP extend into  swan neck deformity;no visible skin breakdown;pt reports she cleans hand in the shower;pt would benefit from palmguard to reduce possiblility of skin breakdown;unable to use functionally due to 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   Lower Extremity Assessment Lower Extremity Assessment: RLE deficits/detail RLE Deficits / Details: weakness in RLE;foot drags during ambulation   Cervical / Trunk Assessment Cervical / Trunk Assessment: Normal   Communication Communication Communication: Expressive difficulties   Cognition Arousal/Alertness: Lethargic Behavior During Therapy: Flat affect Overall Cognitive Status: Difficult to assess(expressive aphasia, no family to )                                 General Comments: per SLP eval, pt has functional receptive language skills, and mild expressive aphasia characterized by high level word retrieval deficits   General Comments       Exercises     Shoulder Instructions      Home Living Family/patient expects to be discharged to:: Private residence Living Arrangements: Spouse/significant other Available Help at Discharge: Family;Available 24 hours/day Type of Home: House Home Access: Level entry     Home Layout: One level     Bathroom Shower/Tub: Occupational psychologist: Standard     Home Equipment: Cane - quad   Additional Comments: no caregiver/family present to validate pt's responses  Lives With: Spouse;Daughter    Prior Functioning/Environment Level of Independence: Needs assistance  Gait / Transfers Assistance Needed: uses a quad ADL's / Homemaking Assistance Needed: Husband A with all ADL activity secondary to 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  Communication / Swallowing Assistance Needed: Increased time secondary to exp aphasia          OT Problem List: Decreased strength;Decreased activity tolerance;Decreased range of motion;Impaired balance (sitting and/or  standing);Decreased knowledge of use of DME or AE;Impaired tone;Impaired UE functional use      OT Treatment/Interventions: Self-care/ADL training;Therapeutic exercise;Neuromuscular education;Energy conservation;DME and/or AE instruction;Therapeutic activities;Cognitive remediation/compensation;Patient/family education;Balance training;Splinting    OT Goals(Current goals can be found in the care plan section) Acute Rehab OT Goals Patient Stated Goal: to go home OT Goal Formulation: With patient Time For Goal Achievement: 10/02/17 Potential to Achieve Goals: Good ADL Goals Pt Will Transfer to Toilet: with min guard assist;ambulating Pt Will Perform Tub/Shower Transfer: with min assist;ambulating Additional ADL Goal #1: Pt/caregiver will demonstrate ability to properly care for pt's R hand to prevent skin breakdown. Additional ADL Goal #2: Pt/caregiver will demonstrate 3 strategies for fall prevention.  OT Frequency: Min 2X/week   Barriers to D/C:            Co-evaluation              AM-PAC PT "6 Clicks" Daily Activity     Outcome Measure Help from another person eating meals?: A Little Help from another person taking care of personal grooming?: A Little Help from another person toileting, which includes using toliet, bedpan, or urinal?: A Little Help from another person bathing (including washing, rinsing, drying)?: A Lot Help from another person to put on and taking off regular upper body clothing?: A Little Help from another person to put on and taking off regular lower body clothing?: A Lot 6 Click Score: 16  End of Session Equipment Utilized During Treatment: Gait belt Nurse Communication: Mobility status  Activity Tolerance: Patient tolerated treatment well Patient left: in chair;with chair alarm set;with call bell/phone within reach  OT Visit Diagnosis: Unsteadiness on feet (R26.81);Other abnormalities of gait and mobility (R26.89);Muscle weakness (generalized)  (M62.81);Other symptoms and signs involving cognitive function;Cognitive communication deficit (R41.841);Hemiplegia and hemiparesis Symptoms and signs involving cognitive functions: Cerebral infarction Hemiplegia - Right/Left: Right Hemiplegia - caused by: Cerebral infarction                Time: 1207-1237 OT Time Calculation (min): 30 min Charges:  OT General Charges $OT Visit: 1 Visit OT Evaluation $OT Eval Moderate Complexity: 1 Mod OT Treatments $Self Care/Home Management : 8-22 mins G-Codes:     Dorinda Hill OTS    Dorinda Hill 09/18/2017, 1:59 PM

## 2017-09-18 NOTE — Progress Notes (Addendum)
STROKE TEAM PROGRESS NOTE   INTERVAL HISTORY Her husband Brandy Paul is at the bedside.  She became acutely aphasic yesterday following her shower. Denies HA, stress (personal, lack of sleep, food or other illness) or other neuro worsening. Feels almost back to baseline today. MRI neg for new stroke.  Vitals:   09/17/17 2342 09/18/17 0142 09/18/17 0824 09/18/17 1203  BP: (!) 148/72 (!) 163/72 (!) 150/70 130/60  Pulse: 66 60 63 80  Resp: 18 18 18 18   Temp: (!) 97.5 F (36.4 C) (!) 97.5 F (36.4 C) 98.4 F (36.9 C) 98.4 F (36.9 C)  TempSrc: Oral Oral  Oral  SpO2: 97% 98% 97% 97%  Weight:      Height:        CBC:  Recent Labs  Lab 09/17/17 1213 09/17/17 1216 09/18/17 0533  WBC 5.7  --  4.9  NEUTROABS 3.0  --   --   HGB 12.5 13.3 11.5*  HCT 41.4 39.0 38.0  MCV 85.2  --  85.0  PLT 199  --  174    Basic Metabolic Panel:  Recent Labs  Lab 09/17/17 1213 09/17/17 1216 09/18/17 0533  NA 139 138 139  K 4.8 4.7 3.8  CL 104 104 106  CO2 27  --  27  GLUCOSE 96 94 87  BUN 13 20 11   CREATININE 0.83 0.80 0.77  CALCIUM 8.8*  --  8.3*   Lipid Panel:     Component Value Date/Time   CHOL 139 09/18/2017 0533   TRIG 150 (H) 09/18/2017 0533   HDL 34 (L) 09/18/2017 0533   CHOLHDL 4.1 09/18/2017 0533   VLDL 30 09/18/2017 0533   LDLCALC 75 09/18/2017 0533   HgbA1c:  Lab Results  Component Value Date   HGBA1C 6.0 (H) 09/18/2017   Urine Drug Screen: No results found for: LABOPIA, COCAINSCRNUR, LABBENZ, AMPHETMU, THCU, LABBARB  Alcohol Level No results found for: ETH  IMAGING Ct Angio Head W Or Wo Contrast  Result Date: 09/18/2017 CLINICAL DATA:  Followup stroke.  Confusion and slurred speech. EXAM: CT ANGIOGRAPHY HEAD AND NECK TECHNIQUE: Multidetector CT imaging of the head and neck was performed using the standard protocol during bolus administration of intravenous contrast. Multiplanar CT image reconstructions and MIPs were obtained to evaluate the vascular anatomy. Carotid  stenosis measurements (when applicable) are obtained utilizing NASCET criteria, using the distal internal carotid diameter as the denominator. CONTRAST:  55mL ISOVUE-370 IOPAMIDOL (ISOVUE-370) INJECTION 76% COMPARISON:  MRI same day.  CT yesterday. FINDINGS: CTA NECK FINDINGS Aortic arch: Aortic atherosclerosis. No aneurysm or dissection. Brachiocephalic branching pattern is normal without origin stenosis. Right carotid system: Common carotid artery widely patent to bifurcation. There is mild calcified plaque at the carotid bifurcation and ICA bulb but no stenosis. Cervical ICA is tortuous, swinging to the midline. There is no stenosis. Left carotid system: Common carotid artery is widely patent to the bifurcation. At the carotid bifurcation, there is calcified plaque but no stenosis. The cervical internal carotid artery is tortuous but widely patent. Vertebral arteries: Both vertebral artery origins are patent. Left vertebral artery is dominant. There is minimal narrowing at the left vertebral origin but no significant stenosis. Both vertebral arteries are widely patent to the foramen magnum. Skeleton: Ordinary cervical spondylosis. Other neck: No mass or lymphadenopathy. Upper chest: Negative Review of the MIP images confirms the above findings CTA HEAD FINDINGS Anterior circulation: Both internal carotid arteries are patent through the skull base and siphon regions. There is atherosclerotic calcification  in both carotid siphon regions but no stenosis. The anterior and middle cerebral vessels are patent. Both A1 segments show diffuse narrowing and atherosclerotic irregularity without flow limiting stenosis. There is narrowing of the M2 vessels on the right. On the left, there is severe stenosis of the distal M1 segment with diminished visualization of the left middle cerebral artery branches. Posterior circulation: Both vertebral arteries are patent through the foramen magnum to the basilar artery with the left  being dominant. No basilar stenosis. Superior cerebellar and posterior cerebral arteries are patent, without proximal abnormality. More distal branch vessels show atherosclerotic irregularity. Venous sinuses: Patent and normal. Anatomic variants: None significant. Delayed phase: No abnormal enhancement. Review of the MIP images confirms the above findings IMPRESSION: Atherosclerosis of the aorta and carotid bifurcations but without advanced disease, significant stenosis or significant irregularity. Cervical internal carotid arteries are tortuous, as might be seen with chronic hypertension. Distal vessel intracranial atherosclerotic disease, consistent with medium to small vessel atherosclerosis. The exception to this is focal stenosis at the distal M1 segment on the left, with diminished visualization of the more distal left middle cerebral artery branches. On the right, the M1 segment appears normal caliber but there is diffuse narrowing beginning at the M2 branches. Electronically Signed   By: Nelson Chimes M.D.   On: 09/18/2017 10:25   Ct Angio Neck W Or Wo Contrast  Result Date: 09/18/2017 CLINICAL DATA:  Followup stroke.  Confusion and slurred speech. EXAM: CT ANGIOGRAPHY HEAD AND NECK TECHNIQUE: Multidetector CT imaging of the head and neck was performed using the standard protocol during bolus administration of intravenous contrast. Multiplanar CT image reconstructions and MIPs were obtained to evaluate the vascular anatomy. Carotid stenosis measurements (when applicable) are obtained utilizing NASCET criteria, using the distal internal carotid diameter as the denominator. CONTRAST:  13mL ISOVUE-370 IOPAMIDOL (ISOVUE-370) INJECTION 76% COMPARISON:  MRI same day.  CT yesterday. FINDINGS: CTA NECK FINDINGS Aortic arch: Aortic atherosclerosis. No aneurysm or dissection. Brachiocephalic branching pattern is normal without origin stenosis. Right carotid system: Common carotid artery widely patent to bifurcation.  There is mild calcified plaque at the carotid bifurcation and ICA bulb but no stenosis. Cervical ICA is tortuous, swinging to the midline. There is no stenosis. Left carotid system: Common carotid artery is widely patent to the bifurcation. At the carotid bifurcation, there is calcified plaque but no stenosis. The cervical internal carotid artery is tortuous but widely patent. Vertebral arteries: Both vertebral artery origins are patent. Left vertebral artery is dominant. There is minimal narrowing at the left vertebral origin but no significant stenosis. Both vertebral arteries are widely patent to the foramen magnum. Skeleton: Ordinary cervical spondylosis. Other neck: No mass or lymphadenopathy. Upper chest: Negative Review of the MIP images confirms the above findings CTA HEAD FINDINGS Anterior circulation: Both internal carotid arteries are patent through the skull base and siphon regions. There is atherosclerotic calcification in both carotid siphon regions but no stenosis. The anterior and middle cerebral vessels are patent. Both A1 segments show diffuse narrowing and atherosclerotic irregularity without flow limiting stenosis. There is narrowing of the M2 vessels on the right. On the left, there is severe stenosis of the distal M1 segment with diminished visualization of the left middle cerebral artery branches. Posterior circulation: Both vertebral arteries are patent through the foramen magnum to the basilar artery with the left being dominant. No basilar stenosis. Superior cerebellar and posterior cerebral arteries are patent, without proximal abnormality. More distal branch vessels show atherosclerotic irregularity. Venous  sinuses: Patent and normal. Anatomic variants: None significant. Delayed phase: No abnormal enhancement. Review of the MIP images confirms the above findings IMPRESSION: Atherosclerosis of the aorta and carotid bifurcations but without advanced disease, significant stenosis or  significant irregularity. Cervical internal carotid arteries are tortuous, as might be seen with chronic hypertension. Distal vessel intracranial atherosclerotic disease, consistent with medium to small vessel atherosclerosis. The exception to this is focal stenosis at the distal M1 segment on the left, with diminished visualization of the more distal left middle cerebral artery branches. On the right, the M1 segment appears normal caliber but there is diffuse narrowing beginning at the M2 branches. Electronically Signed   By: Nelson Chimes M.D.   On: 09/18/2017 10:25   Mr Brain Wo Contrast  Result Date: 09/18/2017 CLINICAL DATA:  TIA.  Confusion and slurring of speech. EXAM: MRI HEAD WITHOUT CONTRAST TECHNIQUE: Multiplanar, multiecho pulse sequences of the brain and surrounding structures were obtained without intravenous contrast. COMPARISON:  Head CT 09/17/2017 FINDINGS: BRAIN: There is no acute infarct, acute hemorrhage or mass effect. The midline structures are normal. Multiple old bilateral cerebellar infarcts. Bilateral old basal ganglia infarcts. Early confluent hyperintense T2-weighted signal of the periventricular and deep white matter, most commonly due to chronic ischemic microangiopathy. Generalized atrophy without lobar predilection. Susceptibility-sensitive sequences show no chronic microhemorrhage or superficial siderosis. VASCULAR: Major intracranial arterial and venous sinus flow voids are preserved. SKULL AND UPPER CERVICAL SPINE: The visualized skull base, calvarium, upper cervical spine and extracranial soft tissues are normal. SINUSES/ORBITS: Small amount of fluid in both mastoids. Paranasal sinuses are clear. There are bilateral lens replacements. IMPRESSION: 1. No acute abnormality. 2. Multiple old infarcts and sequelae of chronic ischemic microangiopathy. 3. Generalized atrophy without lobar predilection. Electronically Signed   By: Ulyses Jarred M.D.   On: 09/18/2017 01:11   Dg Chest  Portable 1 View  Result Date: 09/17/2017 CLINICAL DATA:  Altered mental status. EXAM: PORTABLE CHEST 1 VIEW COMPARISON:  08/22/2015 FINDINGS: The heart size and mediastinal contours are within normal limits. Both lungs are clear. The visualized skeletal structures are unremarkable. Calcification in the arch of the aorta. Surgical clips in the left axilla from previous breast surgery. IMPRESSION: No acute abnormalities. Aortic Atherosclerosis (ICD10-I70.0). Electronically Signed   By: Lorriane Shire M.D.   On: 09/17/2017 13:24   Ct Head Code Stroke Wo Contrast  Result Date: 09/17/2017 CLINICAL DATA:  Code stroke.  Altered level of consciousness EXAM: CT HEAD WITHOUT CONTRAST TECHNIQUE: Contiguous axial images were obtained from the base of the skull through the vertex without intravenous contrast. COMPARISON:  None. FINDINGS: Brain: Ill-defined, chronic appearing remote infarcts in the bilateral basal ganglia and corona radiata, also involving the insular cortex on the left. Patchy small remote bilateral cerebellar infarcts. No evidence of acute infarct. No hemorrhage, hydrocephalus, or masslike finding Vascular: Atherosclerotic calcification.  No hyperdense vessel Skull: Negative Sinuses/Orbits: Postoperative globes.  No acute finding Other: These results were communicated to Dr. Cheral Marker at 12:25 pmon 7/1/2019by text page via the [facility] messaging system. ASPECTS [facility] Stroke Program Early CT Score) Not scored with this history IMPRESSION: 1. No acute finding. 2. Remote infarcts in the bilateral basal ganglia, left insula, and bilateral cerebellum. Electronically Signed   By: Monte Fantasia M.D.   On: 09/17/2017 12:25   2D Echocardiogram  - Left ventricle: The cavity size was normal. Wall thickness was normal. Systolic function was normal. The estimated ejection fraction was in the range of 50% to 55%. Wall motion  was normal; there were no regional wall motion abnormalities. Doppler parameters are  consistent with abnormal left ventricular relaxation (grade 1 diastolic dysfunction). - Mitral valve: Calcified annulus. There was mild regurgitation. - Tricuspid valve: There was mild-moderate regurgitation. Impressions:  Normal LV systolic function; mild distolic dysfunction; mild MR; negative saline microcavitation study.  EEG pending   PHYSICAL EXAM HEENT-  Normocephalic, no lesions, without obvious abnormality.  Normal external eye and conjunctiva.  Cardiovascular-  pulses palpable throughout   Lungs- no excessive working breathing.  Saturations within normal limits on RA. Lungs clear Extremities- Warm, dry and intact Musculoskeletal-no joint tenderness, or swelling. Right arm and hand contracted Skin-warm and dry, intact Neurological Examination  Mental Status: Alert, oriented, to person/place/time/event.  while slow, can accurately name objects, repeat and follow complex commands.  Cranial Nerves: II: Visual fields grossly normal. PERRL  III,IV, VI: ptosis not present, extra-ocular movements full bilaterally with saccadic pursuits noted. No nystagmus.   V,VII: smile asymmetric, slight facial droop on right. facial light touch sensation normal bilaterally VIII: hearing intact to voice IX,X: Palate rises symmetrically XI: No asynmmetry XII: tongue extension slightly to the right Motor: Right :  Upper extremity   0/5              Left:     Upper extremity   4/5             Lower extremity   3/5                          Lower extremity   4/5 Tone and bulk:increased tone in right arm, right arm contracted Sensory: Pinprick and light touch intact throughout, bilaterally Plantars: Right: Mute                         Left: downgoing Cerebellar: Normal finger-to-nose on the left, unable to move right arm Gait: deferred   ASSESSMENT/PLAN [name] is a 72 y.o. female with history of prior stroke, HLD and prediabetes presenting with worsening baseline aphasia.   TIA vs  recrudesce of previous stroke symptoms, differential includes sz  Code Stroke CT head No acute stroke. Old B basal ganglia, L insula and B cerebellar infarcts.   CTA head & neck atherosclerosis of aorta and ICAs. Tortuous ICAs. Intracranial atherosclerosis. Focal L M1 stenosis. RM 2 branch narrowing.  MRI  No acute stroke. Mult old infarcts. Atrophy.   2D Echo bubble  EF 50-55%. No source of embolus   EEG pending   LDL 75  HgbA1c 6.0  SCDs for VTE prophylaxis  aspirin 81 mg daily and clopidogrel 75 mg daily prior to admission, now on aspirin 81 mg daily and clopidogrel 75 mg daily. Continue DAPT at d/c  Therapy recommendations:  HH PT, HH OT  Disposition:  Return home  Hypertension  Stable . 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 I have made any additions or clarifications directly to the above note. Pt was seen and examined.   72 year old female with history of breast cancer, HLD, CAD, stroke in  2013 with mild right-sided deficit and mild expressive aphasia admitted for worsening expressive aphasia episode lasting 15 to 20 minutes.  CT head showed chronic left BG right caudate head and the bilateral cerebellum infarcts.  MRI negative for acute stroke.  EF 50 to 55%.  CTA head and neck showed left M1 stenosis with decreased left MCA branches, right M1/M2 stenosis, which are likely chronic explaining bilateral chronic infarcts.  LDL 75 and A1c 6.0.  EEG pending  Patient symptoms likely due to TIA versus recrudescence of previous stroke.  Patient has been on aspirin and Plavix and Lipitor at home, recommend to continue aspirin and Plavix for stroke prevention, and increase Lipitor from 20 to 40. Will follow.   Rosalin Hawking, MD PhD Stroke Neurology 09/18/2017 4:11 PM     To contact Stroke Continuity provider, please refer to [email or web address] After hours, contact General Neurology

## 2017-09-18 NOTE — Progress Notes (Signed)
Family Medicine Teaching Service Daily Progress Note Intern Pager: [phone]  Patient name: Brandy Paul Medical record number: [id number] Date of birth: December 22, 1945 Age: 72 y.o. Gender: female  Primary Care Provider: Velna Hatchet, MD Consultants: neuro Code Status: full  Pt Overview and Major Events to Date:  7/1 admitted  Assessment and Plan: Brandy Paul is a 72 y.o. female presenting with confusion and slurring of speech. PMH is significant for prior stroke with chronic R sided deficits, anxiety/depression, coronary spasm, T2DM, HLD  TIA- Aphasia claimed from home not present since ED exam.  On chronic home ASA 81 and plavix 75.  No TPA given, CT neg, CXR neg, MRI neg for acute events (chronic only).   No trauma noted, patient with stable vitals and no septic indications, no drug use in history or expected based on presentation.  TSH/lipids wnl, A1c 6.0 -neuro consulted, recs appreciated -carotid US ordered -ECHO ordered -cont ASA 81(missed 7/1 evening dose due to NPO, "added sips with meds" and giving catch am dose) -cont plavix 75 (missed 7/1 evening dose due to NPO, "added sips with meds" and giving catch am dose) -neuro checks Q2 -Pt/ot/slp eval -NS @100  -NPO/sips with meds pending speech eval -SCDs  Scheduled appendectomy/cholecystectomy 7/12: stable: no current abdominal complaints, recent hx of gallbladder drain and appendicitis treated with abx.   -no current intervention indicated during this admission, may follow with outpatient surgeon  Coronary spasm- stable, with no pain.  has imdur and nitro long term, has not used nitro in "I don't know how long when" -cont imdur and nitro  Anxiety/depression-stable -cont home wellbutrin (started in last month) -cont home zoloft  T2DM- in history but CBGs normal, no DM meds on file.  A1c 6.0 -monitor daily BMP -A1c  HLD- chronic atorvastatin/zetia -home atorvastatin -home zetia -lipid panel  Emphysema:  stable on RA, home tiotoprium -home tiotoprium  Hx Prior CVA: On ASA and Plavix.  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  Requires husband's help with some ADL's including showering. - Continue ASA 81 - Continue Plavix  FEN/GI: NPO pending speech eval (sips with meds) protonix Prophylaxis: SCDs  Disposition: pending neuro workup  Subjective:  Still says she feels a little weaker than normal, does not think she has any cognitive slowing and feels mentally normal  Objective: Temp:  [97.5 F (36.4 C)-98.4 F (36.9 C)] 97.5 F (36.4 C) (07/02 0142) Pulse Rate:  [55-71] 60 (07/02 0142) Resp:  [14-19] 18 (07/02 0142) BP: (145-177)/(51-93) 163/72 (07/02 0142) SpO2:  [96 %-100 %] 98 % (07/02 0142) Weight:  [203 lb 0.7 oz (92.1 kg)] 203 lb 0.7 oz (92.1 kg) (07/01 1300) Physical Exam: General: tired on waking for early rounds but pleasant Cardiovascular: RRR, 2/6 murmur Respiratory: CTAB, no wheezing, no IWB Abdomen: no belly pain, soft w/ no masses noted Extremities: no bony abnormalities noted *Neuro: CN exam notable for URE 0/5 strength, LRE 4/5 strength both of which she says is baseline, remaining neuro exam without focal findings although she seems generally weak (claims below baseline but I have no reference point)  Laboratory: Recent Labs  Lab 09/17/17 1213 09/17/17 1216 09/18/17 0533  WBC 5.7  --  4.9  HGB 12.5 13.3 11.5*  HCT 41.4 39.0 38.0  PLT 199  --  173   Recent Labs  Lab 09/17/17 1213 09/17/17 1216  NA 139 138  K 4.8 4.7  CL 104 104  CO2 27  --   BUN 13 20  CREATININE 0.83  0.80  CALCIUM 8.8*  --   PROT 6.5  --   BILITOT 0.8  --   ALKPHOS 79  --   ALT 14  --   AST 19  --   GLUCOSE 96 94    Imaging/Diagnostic Tests: Mr Brain Wo Contrast  Result Date: 09/18/2017 CLINICAL DATA:  TIA.  Confusion and slurring of speech. EXAM: MRI HEAD WITHOUT CONTRAST TECHNIQUE: Multiplanar, multiecho pulse sequences of the brain and surrounding structures  were obtained without intravenous contrast. COMPARISON:  Head CT 09/17/2017 FINDINGS: BRAIN: There is no acute infarct, acute hemorrhage or mass effect. The midline structures are normal. Multiple old bilateral cerebellar infarcts. Bilateral old basal ganglia infarcts. Early confluent hyperintense T2-weighted signal of the periventricular and deep white matter, most commonly due to chronic ischemic microangiopathy. Generalized atrophy without lobar predilection. Susceptibility-sensitive sequences show no chronic microhemorrhage or superficial siderosis. VASCULAR: Major intracranial arterial and venous sinus flow voids are preserved. SKULL AND UPPER CERVICAL SPINE: The visualized skull base, calvarium, upper cervical spine and extracranial soft tissues are normal. SINUSES/ORBITS: Small amount of fluid in both mastoids. Paranasal sinuses are clear. There are bilateral lens replacements. IMPRESSION: 1. No acute abnormality. 2. Multiple old infarcts and sequelae of chronic ischemic microangiopathy. 3. Generalized atrophy without lobar predilection. Electronically Signed   By: Ulyses Jarred M.D.   On: 09/18/2017 01:11   Dg Chest Portable 1 View  Result Date: 09/17/2017 CLINICAL DATA:  Altered mental status. EXAM: PORTABLE CHEST 1 VIEW COMPARISON:  08/22/2015 FINDINGS: The heart size and mediastinal contours are within normal limits. Both lungs are clear. The visualized skeletal structures are unremarkable. Calcification in the arch of the aorta. Surgical clips in the left axilla from previous breast surgery. IMPRESSION: No acute abnormalities. Aortic Atherosclerosis (ICD10-I70.0). Electronically Signed   By: Lorriane Shire M.D.   On: 09/17/2017 13:24   Ct Head Code Stroke Wo Contrast  Result Date: 09/17/2017 CLINICAL DATA:  Code stroke.  Altered level of consciousness EXAM: CT HEAD WITHOUT CONTRAST TECHNIQUE: Contiguous axial images were obtained from the base of the skull through the vertex without intravenous  contrast. COMPARISON:  None. FINDINGS: Brain: Ill-defined, chronic appearing remote infarcts in the bilateral basal ganglia and corona radiata, also involving the insular cortex on the left. Patchy small remote bilateral cerebellar infarcts. No evidence of acute infarct. No hemorrhage, hydrocephalus, or masslike finding Vascular: Atherosclerotic calcification.  No hyperdense vessel Skull: Negative Sinuses/Orbits: Postoperative globes.  No acute finding Other: These results were communicated to Dr. Cheral Marker at 12:25 pmon 7/1/2019by text page via the [facility] messaging system. ASPECTS [facility] Stroke Program Early CT Score) Not scored with this history IMPRESSION: 1. No acute finding. 2. Remote infarcts in the bilateral basal ganglia, left insula, and bilateral cerebellum. Electronically Signed   By: Monte Fantasia M.D.   On: 09/17/2017 12:25     Sherene Sires, DO 09/18/2017, 6:35 AM PGY-2, [place] Intern pager: [phone], text pages welcome

## 2017-09-18 NOTE — Progress Notes (Signed)
OT Cancellation Note  Patient Details Name: Brandy Paul MRN: [id number] DOB: 1945/08/05   Cancelled Treatment:    Reason Eval/Treat Not Completed: Patient at procedure or test/ unavailable  Ramond Dial, OT/L  OT Clinical Specialist [phone]  09/18/2017, 9:43 AM

## 2017-09-18 NOTE — Evaluation (Signed)
Physical Therapy Evaluation Patient Details Name: Brandy Paul MRN: [id number] DOB: 1945/07/06 Today's Date: 09/18/2017   History of Present Illness  Patient is a 72 y/o female admitted with confusion and slurred speech. CT revealing no acute intracranial abnormalities, MRI revealing no acute abnormality but with multiple old infarcts. Patient with a PMH significant for prior stroke with chronic R sided deficits, anxiety/depression, coronary spasm, T2DM, HLD.  Clinical Impression   Brandy Paul is a pleasant 72 y/o female admitted with the above listed diagnosis. Patients husband present for session - quickly answers all questions directed towards patient, therefore difficult to assess cognition and patients perceived PLOF - he is her primary caregiver at home due to prior CVA with residual R sided deficits. Today patient requiring overall Mod A for bed mobility, transfers, and gait with QC. Patient fearful of falling with all mobility, but with no instance of LOB. Will recommend HHPT currently to ensure safe transition into the home environment. PT to continue to follow acutely.    Follow Up Recommendations Home health PT;Supervision/Assistance - 24 hour    Equipment Recommendations  None recommended by PT    Recommendations for Other Services       Precautions / Restrictions Precautions Precautions: Fall Restrictions Weight Bearing Restrictions: No      Mobility  Bed Mobility Overal bed mobility: Needs Assistance Bed Mobility: Supine to Sit     Supine to sit: Min assist;HOB elevated     General bed mobility comments: Min A + HOB elevated - brings B LE towards EOB, but requires assist for trunk control to come fully to EOB  Transfers Overall transfer level: Needs assistance Equipment used: Quad cane Transfers: Sit to/from Stand Sit to Stand: Mod assist;From elevated surface Stand pivot transfers: Mod assist       General transfer comment: Mod A from EOB; patient also  requesting help from husband, but likely able to perform with only 1 assist; assist for immediate standing balance as pateint is fearful of falling  Ambulation/Gait Ambulation/Gait assistance: Min assist Gait Distance (Feet): 12 Feet Assistive device: Quad cane Gait Pattern/deviations: Step-to pattern;Decreased step length - right;Decreased step length - left;Decreased dorsiflexion - right;Decreased weight shift to right Gait velocity: decreased   General Gait Details: R LE with circumduction like pattern due to preferred extended pattern; ateps on lateral edge of foot with gait; fearful of falling  Stairs            Wheelchair Mobility    Modified Rankin (Stroke Patients Only)       Balance Overall balance assessment: Needs assistance Sitting-balance support: No upper extremity supported;Feet supported Sitting balance-Leahy Scale: Good     Standing balance support: Single extremity supported;During functional activity Standing balance-Leahy Scale: Fair Standing balance comment: modA for stability with static standing;knee buckled during stand-pivot                             Pertinent Vitals/Pain Pain Assessment: No/denies pain    Home Living Family/patient expects to be discharged to:: Private residence Living Arrangements: Spouse/significant other Available Help at Discharge: Family;Available 24 hours/day Type of Home: House Home Access: Level entry     Home Layout: One level Home Equipment: Cane - quad Additional Comments: no caregiver/family present to validate pt's responses    Prior Function Level of Independence: Needs assistance   Gait / Transfers Assistance Needed: quad cane for all mobility  ADL's / Homemaking Assistance Needed: Husband A  with all ADL activity secondary to 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         Hand Dominance   Dominant Hand: Left    Extremity/Trunk Assessment   Upper Extremity Assessment Upper  Extremity Assessment: Defer to OT evaluation RUE Deficits / Details: unable to use functionally due to 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;hand contracted, no visible skin breakdown    Lower Extremity Assessment Lower Extremity Assessment: RLE deficits/detail RLE Deficits / Details: weakness in RLE;foot drags during ambulation; poor hip/knee flexion as well as reduced DF at R ankle    Cervical / Trunk Assessment Cervical / Trunk Assessment: Normal  Communication   Communication: Other (comment)(husband present and quickly answers for patient)  Cognition Arousal/Alertness: Awake/alert Behavior During Therapy: Flat affect Overall Cognitive Status: Difficult to assess                                 General Comments: per SLP eval, pt has functional receptive language skills, and mild expressive aphasia characterized by high level word retrieval deficits      General Comments General comments (skin integrity, edema, etc.): pt's R hand contracted, facilitated extension of digits to clean palm and between digits;pt reports she cleans hand in the shower;pt would benefit from palmguard to reduce possiblility of skin breakdown    Exercises     Assessment/Plan    PT Assessment Patient needs continued PT services  PT Problem List Decreased strength;Decreased balance;Decreased activity tolerance;Decreased mobility;Decreased coordination;Decreased knowledge of use of DME;Decreased safety awareness       PT Treatment Interventions DME instruction;Gait training;Functional mobility training;Therapeutic activities;Therapeutic exercise;Balance training;Neuromuscular re-education;Patient/family education    PT Goals (Current goals can be found in the Care Plan section)  Acute Rehab PT Goals Patient Stated Goal: to go home PT Goal Formulation: With patient Time For Goal Achievement: 10/02/17 Potential to Achieve Goals: Good    Frequency Min 4X/week   Barriers to  discharge        Co-evaluation               AM-PAC PT "6 Clicks" Daily Activity  Outcome Measure Difficulty turning over in bed (including adjusting bedclothes, sheets and blankets)?: Unable Difficulty moving from lying on back to sitting on the side of the bed? : Unable Difficulty sitting down on and standing up from a chair with arms (e.g., wheelchair, bedside commode, etc,.)?: Unable Help needed moving to and from a bed to chair (including a wheelchair)?: A Little Help needed walking in hospital room?: A Little Help needed climbing 3-5 steps with a railing? : A Lot 6 Click Score: 11    End of Session Equipment Utilized During Treatment: Gait belt Activity Tolerance: Patient tolerated treatment well Patient left: in chair;with call bell/phone within reach;with chair alarm set;with family/visitor present Nurse Communication: Mobility status PT Visit Diagnosis: Unsteadiness on feet (R26.81);Other abnormalities of gait and mobility (R26.89);Muscle weakness (generalized) (M62.81)    Time: 4970-2637 PT Time Calculation (min) (ACUTE ONLY): 26 min   Charges:   PT Evaluation $PT Eval Moderate Complexity: 1 Mod     PT G Codes:        Lanney Gins, PT, DPT 09/18/17 1:36 PM

## 2017-09-19 MED ORDER — CLOPIDOGREL BISULFATE 75 MG PO TABS
75.0000 mg | ORAL_TABLET | Freq: Every day | ORAL | Status: DC
  Administered 2017-09-19: 75 mg via ORAL
  Filled 2017-09-19: qty 1

## 2017-09-19 MED ORDER — ASPIRIN EC 81 MG PO TBEC
81.0000 mg | DELAYED_RELEASE_TABLET | Freq: Every evening | ORAL | Status: DC
  Administered 2017-09-19: 81 mg via ORAL
  Filled 2017-09-19: qty 1

## 2017-09-19 NOTE — Progress Notes (Signed)
Resident at bedside this AM verbalized for pt to be up and moving, pt's spouse in room at this time. Bedrest order d/c'ed per resident request.   Ave Filter, RN

## 2017-09-19 NOTE — Progress Notes (Signed)
OT spoke with Dr. Erlinda Hong at this time and MD wants pt to be on bedrest a this time. Resident team called.  Ave Filter, RN

## 2017-09-19 NOTE — Progress Notes (Addendum)
STROKE TEAM PROGRESS NOTE   INTERVAL HISTORY Neuro worsening yesterday evening with worsening aphasia found at 530p. CT without changes. Given IVF, given albumin, laid flat. This am she has improved, back to yesterday's status. She has been OOB, up in chair without neuro worsening. On 3rd bag of albumin.   Vitals:   09/18/17 1948 09/18/17 2330 09/19/17 0340 09/19/17 0758  BP: (!) 122/57 130/71 133/69 133/68  Pulse: 71 88 74 70  Resp: 18 18 18 18   Temp: 98 F (36.7 C) 98.1 F (36.7 C) 98.5 F (36.9 C) 98.1 F (36.7 C)  TempSrc: Oral Oral Oral Oral  SpO2: 98% 98% 99% 100%  Weight:      Height:        CBC:  Recent Labs  Lab 09/17/17 1213 09/17/17 1216 09/18/17 0533  WBC 5.7  --  4.9  NEUTROABS 3.0  --   --   HGB 12.5 13.3 11.5*  HCT 41.4 39.0 38.0  MCV 85.2  --  85.0  PLT 199  --  703    Basic Metabolic Panel:  Recent Labs  Lab 09/17/17 1213 09/17/17 1216 09/18/17 0533  NA 139 138 139  K 4.8 4.7 3.8  CL 104 104 106  CO2 27  --  27  GLUCOSE 96 94 87  BUN 13 20 11   CREATININE 0.83 0.80 0.77  CALCIUM 8.8*  --  8.3*   Lipid Panel:     Component Value Date/Time   CHOL 139 09/18/2017 0533   TRIG 150 (H) 09/18/2017 0533   HDL 34 (L) 09/18/2017 0533   CHOLHDL 4.1 09/18/2017 0533   VLDL 30 09/18/2017 0533   LDLCALC 75 09/18/2017 0533   HgbA1c:  Lab Results  Component Value Date   HGBA1C 6.0 (H) 09/18/2017   Urine Drug Screen: No results found for: LABOPIA, COCAINSCRNUR, LABBENZ, AMPHETMU, THCU, LABBARB  Alcohol Level No results found for: ETH  IMAGING Ct Angio Head W Or Wo Contrast  Result Date: 09/18/2017 CLINICAL DATA:  Followup stroke.  Confusion and slurred speech. EXAM: CT ANGIOGRAPHY HEAD AND NECK TECHNIQUE: Multidetector CT imaging of the head and neck was performed using the standard protocol during bolus administration of intravenous contrast. Multiplanar CT image reconstructions and MIPs were obtained to evaluate the vascular anatomy. Carotid  stenosis measurements (when applicable) are obtained utilizing NASCET criteria, using the distal internal carotid diameter as the denominator. CONTRAST:  24mL ISOVUE-370 IOPAMIDOL (ISOVUE-370) INJECTION 76% COMPARISON:  MRI same day.  CT yesterday. FINDINGS: CTA NECK FINDINGS Aortic arch: Aortic atherosclerosis. No aneurysm or dissection. Brachiocephalic branching pattern is normal without origin stenosis. Right carotid system: Common carotid artery widely patent to bifurcation. There is mild calcified plaque at the carotid bifurcation and ICA bulb but no stenosis. Cervical ICA is tortuous, swinging to the midline. There is no stenosis. Left carotid system: Common carotid artery is widely patent to the bifurcation. At the carotid bifurcation, there is calcified plaque but no stenosis. The cervical internal carotid artery is tortuous but widely patent. Vertebral arteries: Both vertebral artery origins are patent. Left vertebral artery is dominant. There is minimal narrowing at the left vertebral origin but no significant stenosis. Both vertebral arteries are widely patent to the foramen magnum. Skeleton: Ordinary cervical spondylosis. Other neck: No mass or lymphadenopathy. Upper chest: Negative Review of the MIP images confirms the above findings CTA HEAD FINDINGS Anterior circulation: Both internal carotid arteries are patent through the skull base and siphon regions. There is atherosclerotic calcification in both  carotid siphon regions but no stenosis. The anterior and middle cerebral vessels are patent. Both A1 segments show diffuse narrowing and atherosclerotic irregularity without flow limiting stenosis. There is narrowing of the M2 vessels on the right. On the left, there is severe stenosis of the distal M1 segment with diminished visualization of the left middle cerebral artery branches. Posterior circulation: Both vertebral arteries are patent through the foramen magnum to the basilar artery with the left  being dominant. No basilar stenosis. Superior cerebellar and posterior cerebral arteries are patent, without proximal abnormality. More distal branch vessels show atherosclerotic irregularity. Venous sinuses: Patent and normal. Anatomic variants: None significant. Delayed phase: No abnormal enhancement. Review of the MIP images confirms the above findings IMPRESSION: Atherosclerosis of the aorta and carotid bifurcations but without advanced disease, significant stenosis or significant irregularity. Cervical internal carotid arteries are tortuous, as might be seen with chronic hypertension. Distal vessel intracranial atherosclerotic disease, consistent with medium to small vessel atherosclerosis. The exception to this is focal stenosis at the distal M1 segment on the left, with diminished visualization of the more distal left middle cerebral artery branches. On the right, the M1 segment appears normal caliber but there is diffuse narrowing beginning at the M2 branches. Electronically Signed   By: Nelson Chimes M.D.   On: 09/18/2017 10:25   Ct Head Wo Contrast  Result Date: 09/18/2017 CLINICAL DATA:  Aphasia. EXAM: CT HEAD WITHOUT CONTRAST TECHNIQUE: Contiguous axial images were obtained from the base of the skull through the vertex without intravenous contrast. COMPARISON:  Brain MRI 09/18/2017 and CT 09/17/2017 FINDINGS: Brain: Chronic infarcts are again seen in the basal ganglia and cerebellum bilaterally. There is involvement of the left insula as well. No definite acute infarct, acute intracranial hemorrhage, mass, midline shift, or extra-axial fluid collection is identified. There is ex vacuo enlargement of both lateral ventricles. Patchy chronic small vessel ischemic disease is again noted in the cerebral white matter bilaterally. Vascular: Calcified atherosclerosis at the skull base. No hyperdense vessel. Skull: No fracture or focal osseous lesion. Sinuses/Orbits: Visualized paranasal sinuses and mastoid air  cells are clear. Bilateral cataract extraction is noted. Other: None. IMPRESSION: 1. No evidence of acute intracranial abnormality. 2. Chronic infarcts as above. Electronically Signed   By: Logan Bores M.D.   On: 09/18/2017 18:30   Ct Angio Neck W Or Wo Contrast  Result Date: 09/18/2017 CLINICAL DATA:  Followup stroke.  Confusion and slurred speech. EXAM: CT ANGIOGRAPHY HEAD AND NECK TECHNIQUE: Multidetector CT imaging of the head and neck was performed using the standard protocol during bolus administration of intravenous contrast. Multiplanar CT image reconstructions and MIPs were obtained to evaluate the vascular anatomy. Carotid stenosis measurements (when applicable) are obtained utilizing NASCET criteria, using the distal internal carotid diameter as the denominator. CONTRAST:  57mL ISOVUE-370 IOPAMIDOL (ISOVUE-370) INJECTION 76% COMPARISON:  MRI same day.  CT yesterday. FINDINGS: CTA NECK FINDINGS Aortic arch: Aortic atherosclerosis. No aneurysm or dissection. Brachiocephalic branching pattern is normal without origin stenosis. Right carotid system: Common carotid artery widely patent to bifurcation. There is mild calcified plaque at the carotid bifurcation and ICA bulb but no stenosis. Cervical ICA is tortuous, swinging to the midline. There is no stenosis. Left carotid system: Common carotid artery is widely patent to the bifurcation. At the carotid bifurcation, there is calcified plaque but no stenosis. The cervical internal carotid artery is tortuous but widely patent. Vertebral arteries: Both vertebral artery origins are patent. Left vertebral artery is dominant. There is minimal  narrowing at the left vertebral origin but no significant stenosis. Both vertebral arteries are widely patent to the foramen magnum. Skeleton: Ordinary cervical spondylosis. Other neck: No mass or lymphadenopathy. Upper chest: Negative Review of the MIP images confirms the above findings CTA HEAD FINDINGS Anterior  circulation: Both internal carotid arteries are patent through the skull base and siphon regions. There is atherosclerotic calcification in both carotid siphon regions but no stenosis. The anterior and middle cerebral vessels are patent. Both A1 segments show diffuse narrowing and atherosclerotic irregularity without flow limiting stenosis. There is narrowing of the M2 vessels on the right. On the left, there is severe stenosis of the distal M1 segment with diminished visualization of the left middle cerebral artery branches. Posterior circulation: Both vertebral arteries are patent through the foramen magnum to the basilar artery with the left being dominant. No basilar stenosis. Superior cerebellar and posterior cerebral arteries are patent, without proximal abnormality. More distal branch vessels show atherosclerotic irregularity. Venous sinuses: Patent and normal. Anatomic variants: None significant. Delayed phase: No abnormal enhancement. Review of the MIP images confirms the above findings IMPRESSION: Atherosclerosis of the aorta and carotid bifurcations but without advanced disease, significant stenosis or significant irregularity. Cervical internal carotid arteries are tortuous, as might be seen with chronic hypertension. Distal vessel intracranial atherosclerotic disease, consistent with medium to small vessel atherosclerosis. The exception to this is focal stenosis at the distal M1 segment on the left, with diminished visualization of the more distal left middle cerebral artery branches. On the right, the M1 segment appears normal caliber but there is diffuse narrowing beginning at the M2 branches. Electronically Signed   By: Nelson Chimes M.D.   On: 09/18/2017 10:25   Mr Brain Wo Contrast  Result Date: 09/18/2017 CLINICAL DATA:  TIA.  Confusion and slurring of speech. EXAM: MRI HEAD WITHOUT CONTRAST TECHNIQUE: Multiplanar, multiecho pulse sequences of the brain and surrounding structures were obtained  without intravenous contrast. COMPARISON:  Head CT 09/17/2017 FINDINGS: BRAIN: There is no acute infarct, acute hemorrhage or mass effect. The midline structures are normal. Multiple old bilateral cerebellar infarcts. Bilateral old basal ganglia infarcts. Early confluent hyperintense T2-weighted signal of the periventricular and deep white matter, most commonly due to chronic ischemic microangiopathy. Generalized atrophy without lobar predilection. Susceptibility-sensitive sequences show no chronic microhemorrhage or superficial siderosis. VASCULAR: Major intracranial arterial and venous sinus flow voids are preserved. SKULL AND UPPER CERVICAL SPINE: The visualized skull base, calvarium, upper cervical spine and extracranial soft tissues are normal. SINUSES/ORBITS: Small amount of fluid in both mastoids. Paranasal sinuses are clear. There are bilateral lens replacements. IMPRESSION: 1. No acute abnormality. 2. Multiple old infarcts and sequelae of chronic ischemic microangiopathy. 3. Generalized atrophy without lobar predilection. Electronically Signed   By: Ulyses Jarred M.D.   On: 09/18/2017 01:11   Dg Chest Portable 1 View  Result Date: 09/17/2017 CLINICAL DATA:  Altered mental status. EXAM: PORTABLE CHEST 1 VIEW COMPARISON:  08/22/2015 FINDINGS: The heart size and mediastinal contours are within normal limits. Both lungs are clear. The visualized skeletal structures are unremarkable. Calcification in the arch of the aorta. Surgical clips in the left axilla from previous breast surgery. IMPRESSION: No acute abnormalities. Aortic Atherosclerosis (ICD10-I70.0). Electronically Signed   By: Lorriane Shire M.D.   On: 09/17/2017 13:24   Ct Head Code Stroke Wo Contrast  Result Date: 09/17/2017 CLINICAL DATA:  Code stroke.  Altered level of consciousness EXAM: CT HEAD WITHOUT CONTRAST TECHNIQUE: Contiguous axial images were obtained from the  base of the skull through the vertex without intravenous contrast.  COMPARISON:  None. FINDINGS: Brain: Ill-defined, chronic appearing remote infarcts in the bilateral basal ganglia and corona radiata, also involving the insular cortex on the left. Patchy small remote bilateral cerebellar infarcts. No evidence of acute infarct. No hemorrhage, hydrocephalus, or masslike finding Vascular: Atherosclerotic calcification.  No hyperdense vessel Skull: Negative Sinuses/Orbits: Postoperative globes.  No acute finding Other: These results were communicated to Dr. Cheral Marker at 12:25 pmon 7/1/2019by text page via the [facility] messaging system. ASPECTS [facility] Stroke Program Early CT Score) Not scored with this history IMPRESSION: 1. No acute finding. 2. Remote infarcts in the bilateral basal ganglia, left insula, and bilateral cerebellum. Electronically Signed   By: Monte Fantasia M.D.   On: 09/17/2017 12:25   2D Echocardiogram  - Left ventricle: The cavity size was normal. Wall thickness was normal. Systolic function was normal. The estimated ejection fraction was in the range of 50% to 55%. Wall motion was normal; there were no regional wall motion abnormalities. Doppler parameters are consistent with abnormal left ventricular relaxation (grade 1 diastolic dysfunction). - Mitral valve: Calcified annulus. There was mild regurgitation. - Tricuspid valve: There was mild-moderate regurgitation. Impressions:  Normal LV systolic function; mild distolic dysfunction; mild MR; negative saline microcavitation study.  EEG This awake and drowsy EEG is abnormal due to slowing of the posterior dominant rhythm.   PHYSICAL EXAM HEENT-  Normocephalic, no lesions, without obvious abnormality.  Normal external eye and conjunctiva.  Cardiovascular-  pulses palpable throughout   Lungs- no excessive working breathing.  Saturations within normal limits on RA. Lungs clear Extremities- Warm, dry and intact Musculoskeletal-no joint tenderness, or swelling. Right arm and hand contracted Skin-warm and  dry, intact Neurological Examination  Mental Status: Alert, oriented, to person/place/time/event.  while slow/hesitant, can accurately name objects, repeat and follow complex commands.  Cranial Nerves: II: Visual fields grossly normal. PERRL  III,IV, VI: ptosis not present, extra-ocular movements full bilaterally with saccadic pursuits noted. No nystagmus.   V,VII: smile asymmetric, slight facial droop on right. facial light touch sensation normal bilaterally VIII: hearing intact to voice IX,X: Palate rises symmetrically XI: No asynmmetry XII: tongue extension slightly to the right Motor: Right :  Upper extremity   0/5              Left:     Upper extremity   4/5             Lower extremity   3/5                          Lower extremity   4/5 Tone and bulk:increased tone in right arm, right arm contracted Sensory: Pinprick and light touch intact throughout, bilaterally Plantars: Right: Mute                         Left: downgoing Cerebellar: Normal finger-to-nose on the left, unable to move right arm Gait: deferred No change in exam from my exam yesterday  ASSESSMENT/PLAN Ms. MAITRI SCHNOEBELEN is a 72 y.o. female with history of prior stroke, HLD and prediabetes presenting with worsening baseline aphasia.   Left MCA hypoperfusion in the setting with left M1 stenosis and decreased left MCA branches  Code Stroke CT head No acute stroke. Old B basal ganglia, L insula and B cerebellar infarcts.   CTA head & neck atherosclerosis of aorta and ICAs. Tortuous ICAs. Intracranial atherosclerosis.  Focal L M1 stenosis. RM 2 branch narrowing.  MRI  No acute stroke. Mult old infarcts. Atrophy.   Repeat CT head 7/2 no acute abnormality, stable.  2D Echo bubble  EF 50-55%. No source of embolus   EEG generalized slowing, no sz  LDL 75  HgbA1c 6.0  SCDs for VTE prophylaxis  aspirin 81 mg daily and clopidogrel 75 mg daily prior to admission, now on aspirin 81 mg daily and clopidogrel 75 mg  daily. Continue DAPT at d/c  Therapy recommendations:  HH PT, HH OT  Disposition:  Return home  Stable from neuro worsening last night, back to yesterday's baseline. No worsening of neuro sx when OOB today. Ok to be OOB, raise head  Recommend she be stable for at least 24h prior to d/c  Follow up stroke clinic Follow-up Stroke Clinic at [facility] in 4 weeks. Office will call with appointment date and time. Order placed.  Intracranial stenosis  CTA head showed left M1 stenosis with decreased left MCA branches. Right M2 stenosis.   Pt worsening symptoms are thought to be due to hyperperfusion in BP-dependent pattern  Was given IVF, albumin and laid fast - symptoms improved  Avoid hypotension  BP goal 130-150  Hypertension  Stable  Orthostatic vitals stable this am . 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 I have made any additions or clarifications directly to the above note. Pt was  seen and examined.   72 year old female with history of breast cancer, HLD, CAD, stroke in 2013 with mild right-sided deficit and mild expressive aphasia admitted for worsening expressive aphasia episode lasting 15 to 20 minutes.  CT head showed chronic left BG right caudate head and the bilateral cerebellum infarcts.  MRI negative for acute stroke.  EF 50 to 55%.  CTA head and neck showed left M1 stenosis with decreased left MCA branches, right M1/M2 stenosis, which are likely chronic explaining bilateral chronic infarcts.  LDL 75 and A1c 6.0. EEG diffuse slowing and no seizure.   Pt again had worsening expressive aphasia yesterday afternoon. Repeat head CT no change. Considered hypoperfusion given BP at 120s with left M1 significant stenosis and decreased MCA branches. She received IVF bolus and infusion, albumin Q6h and lying flat over night. This am pt back to baseline. Orthostatic vital stable.   Patient symptoms likely left MCA hypoperfusion in the setting of relative low BP. Recommend keep BP goal 130-150. encourage po intake. Avoid dehydration. Patient has been on aspirin and Plavix  and Lipitor at home, recommend to continue aspirin and Plavix for stroke prevention, and increase Lipitor from 20 to 40.   Neurology will sign off. Please call with questions. Pt will follow up with stroke clinic NP at [facility] in about 4 weeks. Thanks for the consult.  Rosalin Hawking, MD PhD Stroke Neurology 09/19/2017 4:00 PM   To contact Stroke Continuity provider, please refer to [email or web address] After hours, contact General Neurology

## 2017-09-19 NOTE — Progress Notes (Addendum)
  Speech Language Pathology Treatment: Dysphagia  Patient Details Name: Brandy Paul MRN: [id number] DOB: 11-19-1945 Today's Date: 09/19/2017 Time: 1683-7290 SLP Time Calculation (min) (ACUTE ONLY): 9 min  Assessment / Plan / Recommendation Clinical Impression  Pt seen this morning for follow up dysphagia treatment after questionable neuro changes yesterday. Spouse reports pt typically has difficulty speaking after she wakes up due to history of expressive aphasia. She consumed breakfast without coughing this morning per spouse. SLP observed with sips thin via straw and solid without cough, throat clear or wet vocal quality. Order also for speech-language-cognitive eval. Pt has history of expressive aphasia and speech-language is back to baseline- no need for formal eval.  Recommend continue regular/thin, swallow precautions. ST will sign off. Please reconsult if needed.    HPI HPI: 72 year old female admitted 09/17/17 with TIA symptoms. 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 MRI = no acute abnormality, multiple old infarcts, generalized atrophy      SLP Plan  All goals met;Discharge SLP treatment due to (comment)       Recommendations  Diet recommendations: Regular;Thin liquid Liquids provided via: Cup;Straw Medication Administration: Whole meds with liquid Supervision: Patient able to self feed;Intermittent supervision to cue for compensatory strategies Compensations: Slow rate;Small sips/bites Postural Changes and/or Swallow Maneuvers: Seated upright 90 degrees;Upright 30-60 min after meal(husband says she has reflux)                Oral Care Recommendations: Oral care BID Follow up Recommendations: None SLP Visit Diagnosis: Dysphagia, unspecified (R13.10) Plan: All goals met;Discharge SLP treatment due to (comment)                       Houston Siren 09/19/2017, 10:10 AM  Orbie Pyo Colvin Caroli.Ed [employer] [phone]

## 2017-09-19 NOTE — Progress Notes (Signed)
Occupational Therapy Treatment Patient Details Name: Brandy Paul MRN: [id number] DOB: Oct 14, 1945 Today's Date: 09/19/2017    History of present illness Patient is a 72 y/o female admitted with confusion and slurred speech. CT revealing no acute intracranial abnormalities, MRI revealing no acute abnormality but with multiple old infarcts. Patient with a PMH significant for prior stroke with chronic R sided deficits, anxiety/depression, coronary spasm, T2DM, HLD. 7/2 pm RN reported acute neuro change. CT no acute changes. Dr Erlinda Hong placed order for bed flat with SBP goal 140s.    OT comments  Pt seen at bed level to fit with palm guard and educate husband on opening spatic R hand and managing contracture. Pt complaining of L hand pain. Pt states she had a blood draw from her L hand; noted bruising. Recommend elevating L hand and alternating heat/ice. Will continue to educate caregivers to facilitate safe DC home once pt off bedrest. Husband to bring in "Up and go RW" that pt uses at home.   Follow Up Recommendations  Home health OT;Supervision/Assistance - 24 hour    Equipment Recommendations       Recommendations for Other Services PT consult    Precautions / Restrictions Precautions Precautions: Fall Precaution Comments: watch BP; hand contracture; painful L hand       Mobility Bed Mobility                  Transfers                      Balance                                           ADL either performed or assessed with clinical judgement   ADL                                               Vision       Perception     Praxis      Cognition Arousal/Alertness: Awake/alert Behavior During Therapy: Flat affect Overall Cognitive Status: Difficult to assess                                 General Comments: most likely baseline        Exercises     Shoulder Instructions       General  Comments      Pertinent Vitals/ Pain       Pain Assessment: Faces Faces Pain Scale: Hurts little more Pain Location: L hand Pain Descriptors / Indicators: Discomfort;Grimacing Pain Intervention(s): Limited activity within patient's tolerance;Repositioned;Ice applied;Heat applied  Home Living                                          Prior Functioning/Environment              Frequency  Min 3X/week        Progress Toward Goals  OT Goals(current goals can now be found in the care plan section)  Progress towards OT goals: Progressing toward goals  Acute Rehab OT Goals Patient Stated  Goal: to go home OT Goal Formulation: With patient Time For Goal Achievement: 10/02/17 Potential to Achieve Goals: Good ADL Goals Pt Will Transfer to Toilet: with min guard assist;ambulating Pt Will Perform Tub/Shower Transfer: with min assist;ambulating Additional ADL Goal #1: Pt/caregiver will demonstrate ability to properly care for pt's R hand to prevent skin breakdown. Additional ADL Goal #2: Pt/caregiver will demonstrate 3 strategies for fall prevention. Additional ADL Goal #3: Pt/caregiver will independently demonstrate management of R palm guard and R hand.  Plan Discharge plan remains appropriate;Frequency needs to be updated    Co-evaluation                 AM-PAC PT "6 Clicks" Daily Activity     Outcome Measure   Help from another person eating meals?: A Little Help from another person taking care of personal grooming?: A Little Help from another person toileting, which includes using toliet, bedpan, or urinal?: A Little Help from another person bathing (including washing, rinsing, drying)?: A Lot Help from another person to put on and taking off regular upper body clothing?: A Little Help from another person to put on and taking off regular lower body clothing?: A Lot 6 Click Score: 16    End of Session    OT Visit Diagnosis: Unsteadiness on feet  (R26.81);Other abnormalities of gait and mobility (R26.89);Muscle weakness (generalized) (M62.81);Other symptoms and signs involving cognitive function;Cognitive communication deficit (R41.841);Hemiplegia and hemiparesis;Pain Symptoms and signs involving cognitive functions: Cerebral infarction Hemiplegia - Right/Left: Right Hemiplegia - caused by: Cerebral infarction(old CVA) Pain - Right/Left: Left Pain - part of body: Hand   Activity Tolerance Other (comment)(limited by bedrest)   Patient Left in bed;with call bell/phone within reach;with family/visitor present   Nurse Communication Other (comment)(L palm guard; management of L hand)        Time: 0240-9735 OT Time Calculation (min): 22 min  Charges: OT General Charges $OT Visit: 1 Visit OT Treatments $Self Care/Home Management : 8-22 mins  Maurie Boettcher, OT/L  OT Clinical Specialist [phone]    [facility] 09/19/2017, 11:07 AM

## 2017-09-19 NOTE — Progress Notes (Signed)
RN spoke with resident and confirmed with Dr. Erlinda Hong at this time that patient is allow to be up [place]. Family notified of change.   Ave Filter, RN

## 2017-09-19 NOTE — Progress Notes (Signed)
Family Medicine Teaching Service Daily Progress Note Intern Pager: [phone]  Patient name: Brandy Paul Medical record number: [id number] Date of birth: 06-19-1945 Age: 72 y.o. Gender: female  Primary Care Provider: Velna Hatchet, MD Consultants: neuro Code Status: full  Pt Overview and Major Events to Date:  7/1 admitted  Assessment and Plan: Brandy Paul is a 72 y.o. female presenting with confusion and slurring of speech. PMH is significant for prior stroke with chronic R sided deficits, anxiety/depression, coronary spasm, T2DM, HLD  TIA- Aphasia event 7/2.  On chronic home ASA 81 and plavix 75.  No TPA given, initial CT neg, CXR neg, MRI neg for acute events (chronic only).   No trauma noted, patient with stable vitals and no septic indications, no drug use in history or expected based on presentation.  TSH/lipids wnl, A1c 6.0. repreat CT neg -neuro consulted, recs appreciated -cont ASA 81 -cont plavix 75 -neuro checks Q2 -Pt/ot/ recommned HH -NS @75  (goal sbp 140) -SCDs  Scheduled appendectomy/cholecystectomy 7/12: will need to delay elective proceudre.  stable: no current abdominal complaints, recent hx of gallbladder drain and appendicitis treated with abx.   -no current intervention indicated during this admission, may follow with outpatient surgeon -notify surgeon  Coronary spasm- stable, with no pain.  has imdur and nitro long term, has not used nitro in "I don't know how long when" -cont imdur and nitro  Anxiety/depression-stable -cont home wellbutrin (started in last month) -cont home zoloft  T2DM- in history but CBGs normal, no DM meds on file.  A1c 6.0 -monitor daily BMP  HLD- chronic atorvastatin/zetia -home atorvastatin -home zetia -lipid panel  Emphysema: stable on RA, home tiotoprium -home tiotoprium  Hx Prior CVA: On ASA and Plavix.  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  Requires husband's help with some ADL's including  showering. - Continue ASA 81 - Continue Plavix  FEN/GI: heart health diet, regular protonix Prophylaxis: SCDs  Disposition: pending neuro workup  Subjective:  Still says she feels she is "searching" for words, no focal muscular complaints, no pain  Objective: Temp:  [98 F (36.7 C)-98.5 F (36.9 C)] 98.1 F (36.7 C) (07/03 0758) Pulse Rate:  [70-88] 70 (07/03 0758) Resp:  [18] 18 (07/03 0758) BP: (120-133)/(52-71) 133/68 (07/03 0758) SpO2:  [97 %-100 %] 100 % (07/03 0758) Physical Exam: General: tired on waking for early rounds and still had some delayed speech with searchign for words Cardiovascular: RRR, slight murmur Respiratory: CTAB, no IWB Abdomen: no belly pain, initially claimed pelvic pain but then said she didn't mean to say that, just some mild back pain, which she then denied on exam Extremities: no bony abnormalities noted *Neuro: unchanged.  CN exam notable for URE 0/5 strength, LRE 4/5 strength both of which she says is baseline, remaining neuro exam without focal findings although she seems generally weak (at baseline for this admission)  Laboratory: Recent Labs  Lab 09/17/17 1213 09/17/17 1216 09/18/17 0533  WBC 5.7  --  4.9  HGB 12.5 13.3 11.5*  HCT 41.4 39.0 38.0  PLT 199  --  173   Recent Labs  Lab 09/17/17 1213 09/17/17 1216 09/18/17 0533  NA 139 138 139  K 4.8 4.7 3.8  CL 104 104 106  CO2 27  --  27  BUN 13 20 11   CREATININE 0.83 0.80 0.77  CALCIUM 8.8*  --  8.3*  PROT 6.5  --   --   BILITOT 0.8  --   --  ALKPHOS 79  --   --   ALT 14  --   --   AST 19  --   --   GLUCOSE 96 94 87    Imaging/Diagnostic Tests: Mr Brain Wo Contrast  Result Date: 09/18/2017 CLINICAL DATA:  TIA.  Confusion and slurring of speech. EXAM: MRI HEAD WITHOUT CONTRAST TECHNIQUE: Multiplanar, multiecho pulse sequences of the brain and surrounding structures were obtained without intravenous contrast. COMPARISON:  Head CT 09/17/2017 FINDINGS: BRAIN: There is no  acute infarct, acute hemorrhage or mass effect. The midline structures are normal. Multiple old bilateral cerebellar infarcts. Bilateral old basal ganglia infarcts. Early confluent hyperintense T2-weighted signal of the periventricular and deep white matter, most commonly due to chronic ischemic microangiopathy. Generalized atrophy without lobar predilection. Susceptibility-sensitive sequences show no chronic microhemorrhage or superficial siderosis. VASCULAR: Major intracranial arterial and venous sinus flow voids are preserved. SKULL AND UPPER CERVICAL SPINE: The visualized skull base, calvarium, upper cervical spine and extracranial soft tissues are normal. SINUSES/ORBITS: Small amount of fluid in both mastoids. Paranasal sinuses are clear. There are bilateral lens replacements. IMPRESSION: 1. No acute abnormality. 2. Multiple old infarcts and sequelae of chronic ischemic microangiopathy. 3. Generalized atrophy without lobar predilection. Electronically Signed   By: Ulyses Jarred M.D.   On: 09/18/2017 01:11   Dg Chest Portable 1 View  Result Date: 09/17/2017 CLINICAL DATA:  Altered mental status. EXAM: PORTABLE CHEST 1 VIEW COMPARISON:  08/22/2015 FINDINGS: The heart size and mediastinal contours are within normal limits. Both lungs are clear. The visualized skeletal structures are unremarkable. Calcification in the arch of the aorta. Surgical clips in the left axilla from previous breast surgery. IMPRESSION: No acute abnormalities. Aortic Atherosclerosis (ICD10-I70.0). Electronically Signed   By: Lorriane Shire M.D.   On: 09/17/2017 13:24   Ct Head Code Stroke Wo Contrast  Result Date: 09/17/2017 CLINICAL DATA:  Code stroke.  Altered level of consciousness EXAM: CT HEAD WITHOUT CONTRAST TECHNIQUE: Contiguous axial images were obtained from the base of the skull through the vertex without intravenous contrast. COMPARISON:  None. FINDINGS: Brain: Ill-defined, chronic appearing remote infarcts in the  bilateral basal ganglia and corona radiata, also involving the insular cortex on the left. Patchy small remote bilateral cerebellar infarcts. No evidence of acute infarct. No hemorrhage, hydrocephalus, or masslike finding Vascular: Atherosclerotic calcification.  No hyperdense vessel Skull: Negative Sinuses/Orbits: Postoperative globes.  No acute finding Other: These results were communicated to Dr. Cheral Marker at 12:25 pmon 7/1/2019by text page via the [facility] messaging system. ASPECTS [facility] Stroke Program Early CT Score) Not scored with this history IMPRESSION: 1. No acute finding. 2. Remote infarcts in the bilateral basal ganglia, left insula, and bilateral cerebellum. Electronically Signed   By: Monte Fantasia M.D.   On: 09/17/2017 12:25     Sherene Sires, DO 09/19/2017, 9:36 AM PGY-2, [place] Intern pager: [phone], text pages welcome

## 2017-09-19 NOTE — Progress Notes (Signed)
Physical Therapy Treatment Patient Details Name: Brandy Paul MRN: [id number] DOB: 1945/03/21 Today's Date: 09/19/2017    History of Present Illness Patient is a 72 y/o female admitted with confusion and slurred speech. CT revealing no acute intracranial abnormalities, MRI revealing no acute abnormality but with multiple old infarcts. Patient with a PMH significant for prior stroke with chronic R sided deficits, anxiety/depression, coronary spasm, T2DM, HLD. 7/2 pm RN reported acute neuro change. CT no acute changes. Dr Erlinda Hong placed order for bed flat with SBP goal 140s.     PT Comments    PT speaking with RN prior to session - reports MD lifted bed rest orders. Patient participating in normal conversation today. Performing limited mobility today with primary complaints of L hand pain limiting use of quad cane - husband reports he will bring "UP and GO" RW. Requires Min/Mod A for bed mobility and Min A for all transfers and gait within room. Consistent verbal cueing for improved gait mechanics with limited carryover. PT to continue to follow acutely.     Follow Up Recommendations  Home health PT;Supervision/Assistance - 24 hour     Equipment Recommendations  None recommended by PT    Recommendations for Other Services       Precautions / Restrictions Precautions Precautions: Fall Precaution Comments: watch BP; hand contracture; painful L hand Restrictions Weight Bearing Restrictions: No    Mobility  Bed Mobility Overal bed mobility: Needs Assistance Bed Mobility: Supine to Sit;Sit to Supine     Supine to sit: Min assist;HOB elevated Sit to supine: Mod assist   General bed mobility comments: patient with good initiation of activity, but does require Min A from PT to pull up to seated position; Mod A for LE management for return to supine  Transfers Overall transfer level: Needs assistance Equipment used: Quad cane Transfers: Sit to/from Stand Sit to Stand: Min assist;From  elevated surface         General transfer comment: multiple attempts to stand by patient as she reports L hand pain limting her ability to push up from bedside.  Ambulation/Gait Ambulation/Gait assistance: Min Web designer (Feet): 10 Feet Assistive device: Quad cane;2 person hand held assist Gait Pattern/deviations: Step-to pattern;Decreased step length - right;Decreased step length - left;Decreased dorsiflexion - right;Decreased weight shift to right Gait velocity: decreased   General Gait Details: Attmepted use of QC, however L hand pain limiting patient; reverted to +2 HHA with ability to ambulate short distance in room; no other complaints during gait   Stairs             Wheelchair Mobility    Modified Rankin (Stroke Patients Only)       Balance Overall balance assessment: Needs assistance Sitting-balance support: No upper extremity supported;Feet supported Sitting balance-Leahy Scale: Good     Standing balance support: Bilateral upper extremity supported;During functional activity Standing balance-Leahy Scale: Poor                              Cognition Arousal/Alertness: Awake/alert Behavior During Therapy: Flat affect Overall Cognitive Status: Difficult to assess                                 General Comments: most likely baseline      Exercises      General Comments        Pertinent Vitals/Pain Pain Assessment:  Faces Faces Pain Scale: Hurts little more Pain Location: L hand Pain Descriptors / Indicators: Grimacing;Moaning;Guarding Pain Intervention(s): Limited activity within patient's tolerance;Monitored during session;Repositioned    Home Living                      Prior Function            PT Goals (current goals can now be found in the care plan section) Acute Rehab PT Goals Patient Stated Goal: to go home PT Goal Formulation: With patient Time For Goal Achievement:  10/02/17 Potential to Achieve Goals: Good Progress towards PT goals: Progressing toward goals    Frequency    Min 4X/week      PT Plan Current plan remains appropriate    Co-evaluation              AM-PAC PT "6 Clicks" Daily Activity  Outcome Measure  Difficulty turning over in bed (including adjusting bedclothes, sheets and blankets)?: Unable Difficulty moving from lying on back to sitting on the side of the bed? : Unable Difficulty sitting down on and standing up from a chair with arms (e.g., wheelchair, bedside commode, etc,.)?: Unable Help needed moving to and from a bed to chair (including a wheelchair)?: A Little Help needed walking in hospital room?: A Little Help needed climbing 3-5 steps with a railing? : A Lot 6 Click Score: 11    End of Session Equipment Utilized During Treatment: Gait belt Activity Tolerance: Patient tolerated treatment well Patient left: in bed;with call bell/phone within reach;with bed alarm set;with nursing/sitter in room;with family/visitor present Nurse Communication: Mobility status PT Visit Diagnosis: Unsteadiness on feet (R26.81);Other abnormalities of gait and mobility (R26.89);Muscle weakness (generalized) (M62.81)     Time: 4081-4481 PT Time Calculation (min) (ACUTE ONLY): 17 min  Charges:  $Gait Training: 8-22 mins                    G Codes:        Lanney Gins, PT, DPT 09/19/17 12:19 PM

## 2017-09-20 LAB — BASIC METABOLIC PANEL
Anion gap: 7 (ref 5–15)
BUN: 8 mg/dL (ref 8–23)
CHLORIDE: 109 mmol/L (ref 98–111)
CO2: 26 mmol/L (ref 22–32)
Calcium: 8.8 mg/dL — ABNORMAL LOW (ref 8.9–10.3)
Creatinine, Ser: 0.76 mg/dL (ref 0.44–1.00)
GFR calc Af Amer: >60 mL/min (ref >60)
GFR calc non Af Amer: >60 mL/min (ref >60)
GLUCOSE: 103 mg/dL — AB (ref 70–99)
POTASSIUM: 3.9 mmol/L (ref 3.5–5.1)
Sodium: 142 mmol/L (ref 135–145)

## 2017-09-20 LAB — CBC
HEMATOCRIT: 34.3 % — AB (ref 36.0–46.0)
Hemoglobin: 10.3 g/dL — ABNORMAL LOW (ref 12.0–15.0)
MCH: 25.5 pg — ABNORMAL LOW (ref 26.0–34.0)
MCHC: 30 g/dL (ref 30.0–36.0)
MCV: 84.9 fL (ref 78.0–100.0)
Platelets: 157 10*3/uL (ref 150–400)
RBC: 4.04 MIL/uL (ref 3.87–5.11)
RDW: 16.9 % — ABNORMAL HIGH (ref 11.5–15.5)
WBC: 5.6 10*3/uL (ref 4.0–10.5)

## 2017-09-20 MED ORDER — ATORVASTATIN CALCIUM 40 MG PO TABS: 40.0000 mg | ORAL_TABLET | Freq: Every day | ORAL | 0 refills | Status: DC

## 2017-09-20 NOTE — Care Management Note (Signed)
Case Management Note  Patient Details  Name: Brandy Paul MRN: [id number] Date of Birth: [birth year]  Subjective/Objective:                    Action/Plan: Pt with orders for [facility] services. CM met with the patient and she says she recently had [facility] services but can not remember the agency. CM called patients spouse and he stated they use [facility]. Dorian Pod with [facility] notified and accepted the referral.  PCP: Dr Ardeth Perfect Insurance: Medicare If patient discharges today, spouse would like a call so he can provide transportation home.   Expected Discharge Date:                  Expected Discharge Plan:  [place]  In-House Referral:     Discharge planning Services  CM Consult  Post Acute Care Choice:  Home Health Choice offered to:  Patient, Spouse  DME Arranged:    DME Agency:     HH Arranged:  PT, OT HH Agency:  [facility]  Status of Service:  Completed, signed off  If discussed at [place] of Stay Meetings, dates discussed:    Additional Comments:  Pollie Friar, RN 09/20/2017, 12:40 PM

## 2017-09-20 NOTE — Progress Notes (Addendum)
FMTS Attending Daily Note:  Brandy Netters MD Personal pager:  [phone] FPTS Service Pager:  [phone]  I have seen and examined this patient and have reviewed their chart. I have discussed this patient with the resident. I agree with the resident's findings, assessment and care plan.  Additionally:  Bps improved overnight Stopped fluids this AM, monitored BPs which were acceptable Patient feels well. Has 24 hour supervision at home. Stable for discharge home today.  Brandy Rio, MD   ------------------------------------------  Family Medicine Teaching Service Daily Progress Note Intern Pager: [phone]  Patient name: Brandy Paul Medical record number: [id number] Date of birth: [birth year] Age: 72 y.o. Gender: female  Primary Care Provider: Velna Hatchet, MD Consultants: neuro Code Status: full  Pt Overview and Major Events to Date:  7/1 admitted  Assessment and Plan: Brandy Paul is a 72 y.o. female presenting with confusion and slurring of speech. PMH is significant for prior stroke with chronic R sided deficits, anxiety/depression, coronary spasm, T2DM, HLD  TIA- Aphasia event 7/2.  On chronic home ASA 81 and plavix 75.  No TPA given, initial CT neg, CXR neg, MRI neg for acute events (chronic only).   No trauma noted, patient with stable vitals and no septic indications, no drug use in history or expected based on presentation.  TSH/lipids wnl, A1c 6.0. repreat CT neg -neuro consulted, recs appreciated -cont ASA 81 -cont plavix 75 -neuro checks Q2 -Pt/ot/ recommned HH -NS @75  (goal sbp 140)  Scheduled appendectomy/cholecystectomy 7/12: will need to delay elective proceudre.  stable: no current abdominal complaints, recent hx of gallbladder drain and appendicitis treated with abx.   -no current intervention indicated during this admission, may follow with outpatient surgeon -notify surgeon  Coronary spasm- stable, with no pain.  has imdur and nitro  long term, has not used nitro in "I don't know how long when" -cont imdur and nitro  Anxiety/depression-stable -cont home wellbutrin (started in last month) -cont home zoloft  T2DM- in history but CBGs normal, no DM meds on file.  A1c 6.0 -monitor daily BMP  HLD- chronic atorvastatin/zetia -home atorvastatin -home zetia -lipid panel  Emphysema: stable on RA, home tiotoprium -home tiotoprium  Hx Prior CVA: On ASA and Plavix.  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  Requires husband's help with some ADL's including showering. - Continue ASA 81 - Continue Plavix  FEN/GI: heart health diet, regular protonix Prophylaxis: SCDs  Disposition: DC home today (7/4)  Subjective:  Pt seen this am.  Pt felt that her aphasia was improving and she was having less trouble searching and finding words.  Pt believes she is back to her baseline regarding her thought process and speaking ability.  Pt had no concerns this am.  Objective: Temp:  [97.8 F (36.6 C)-98.3 F (36.8 C)] 97.8 F (36.6 C) (07/04 0326) Pulse Rate:  [70-84] 83 (07/04 0326) Resp:  [18-20] 18 (07/04 0326) BP: (104-142)/(41-68) 139/61 (07/04 0326) SpO2:  [98 %-100 %] 100 % (07/04 0732) Physical Exam: General: Alert and cooperative and appears to be in no acute distress HEENT: Neck non-tender without lymphadenopathy, masses or thyromegaly Cardio: Normal A1 and S2, no S3 or S4. Rhythm is regular. 2/6 systolic murmur at left sternal border.   Pulm: Clear to auscultation bilaterally, no crackles, wheezing, or diminished breath sounds. Normal respiratory effort Abdomen: Bowel sounds normal. Abdomen soft and non-tender.  Extremities: No peripheral edema. Warm/ well perfused.  Strong radial pulses. Neuro: Cranial nerves grossly intact  Laboratory: Recent Labs  Lab 09/17/17 1213 09/17/17 1216 09/18/17 0533 09/20/17 0332  WBC 5.7  --  4.9 5.6  HGB 12.5 13.3 11.5* 10.3*  HCT 41.4 39.0 38.0 34.3*  PLT 199   --  173 157   Recent Labs  Lab 09/17/17 1213 09/17/17 1216 09/18/17 0533 09/20/17 0332  NA 139 138 139 142  K 4.8 4.7 3.8 3.9  CL 104 104 106 109  CO2 27  --  27 26  BUN 13 20 11 8   CREATININE 0.83 0.80 0.77 0.76  CALCIUM 8.8*  --  8.3* 8.8*  PROT 6.5  --   --   --   BILITOT 0.8  --   --   --   ALKPHOS 79  --   --   --   ALT 14  --   --   --   AST 19  --   --   --   GLUCOSE 96 94 87 103*    Imaging/Diagnostic Tests: Mr Brain Wo Contrast  Result Date: 09/18/2017 CLINICAL DATA:  TIA.  Confusion and slurring of speech. EXAM: MRI HEAD WITHOUT CONTRAST TECHNIQUE: Multiplanar, multiecho pulse sequences of the brain and surrounding structures were obtained without intravenous contrast. COMPARISON:  Head CT 09/17/2017 FINDINGS: BRAIN: There is no acute infarct, acute hemorrhage or mass effect. The midline structures are normal. Multiple old bilateral cerebellar infarcts. Bilateral old basal ganglia infarcts. Early confluent hyperintense T2-weighted signal of the periventricular and deep white matter, most commonly due to chronic ischemic microangiopathy. Generalized atrophy without lobar predilection. Susceptibility-sensitive sequences show no chronic microhemorrhage or superficial siderosis. VASCULAR: Major intracranial arterial and venous sinus flow voids are preserved. SKULL AND UPPER CERVICAL SPINE: The visualized skull base, calvarium, upper cervical spine and extracranial soft tissues are normal. SINUSES/ORBITS: Small amount of fluid in both mastoids. Paranasal sinuses are clear. There are bilateral lens replacements. IMPRESSION: 1. No acute abnormality. 2. Multiple old infarcts and sequelae of chronic ischemic microangiopathy. 3. Generalized atrophy without lobar predilection. Electronically Signed   By: Ulyses Jarred M.D.   On: 09/18/2017 01:11   Dg Chest Portable 1 View  Result Date: 09/17/2017 CLINICAL DATA:  Altered mental status. EXAM: PORTABLE CHEST 1 VIEW COMPARISON:  08/22/2015  FINDINGS: The heart size and mediastinal contours are within normal limits. Both lungs are clear. The visualized skeletal structures are unremarkable. Calcification in the arch of the aorta. Surgical clips in the left axilla from previous breast surgery. IMPRESSION: No acute abnormalities. Aortic Atherosclerosis (ICD10-I70.0). Electronically Signed   By: Lorriane Shire M.D.   On: 09/17/2017 13:24   Ct Head Code Stroke Wo Contrast  Result Date: 09/17/2017 CLINICAL DATA:  Code stroke.  Altered level of consciousness EXAM: CT HEAD WITHOUT CONTRAST TECHNIQUE: Contiguous axial images were obtained from the base of the skull through the vertex without intravenous contrast. COMPARISON:  None. FINDINGS: Brain: Ill-defined, chronic appearing remote infarcts in the bilateral basal ganglia and corona radiata, also involving the insular cortex on the left. Patchy small remote bilateral cerebellar infarcts. No evidence of acute infarct. No hemorrhage, hydrocephalus, or masslike finding Vascular: Atherosclerotic calcification.  No hyperdense vessel Skull: Negative Sinuses/Orbits: Postoperative globes.  No acute finding Other: These results were communicated to Dr. Cheral Marker at 12:25 pmon 7/1/2019by text page via the [facility] messaging system. ASPECTS [facility] Stroke Program Early CT Score) Not scored with this history IMPRESSION: 1. No acute finding. 2. Remote infarcts in the bilateral basal ganglia, left insula, and bilateral cerebellum. Electronically Signed  By: Monte Fantasia M.D.   On: 09/17/2017 12:25     Matilde Haymaker, MD 09/20/2017, 7:55 AM PGY-2, [place] Intern pager: [phone], text pages welcome

## 2017-09-20 NOTE — Discharge Summary (Signed)
[facility] Discharge Summary  Patient name: Brandy Paul Medical record number: [id number] Date of birth: [birth year] Age: 72 y.o. Gender: female Date of Admission: 09/17/2017  Date of Discharge: 7/4 Admitting Physician: Dickie La, MD  Primary Care Provider: Velna Hatchet, MD Consultants: Neurology  Indication for Hospitalization: TIA  Discharge Diagnoses/Problem List:  TIA Transient speech disturbance History or CVA Coronary spasm Anxiety/depression T2DM HLD Emphysema  Disposition: Discharge home  Discharge Condition: stable   Discharge Exam:  General: Alert and cooperative and appears to be in no acute distress, pt pleasant and engaged in conversation HEENT: Neck non-tender without lymphadenopathy, masses or thyromegaly Cardio: Normal A1 and S2, no S3 or S4. Rhythm is regular.1-2 systolic murmur.   Pulm: Clear to auscultation bilaterally, no crackles, wheezing, or diminished breath sounds. Normal respiratory effort Abdomen: Bowel sounds normal. Abdomen soft and non-tender.  Extremities: No peripheral edema. Warm/ well perfused.  Strong radial and pedal pulses. Neuro: Cranial nerves grossly intact, 5/5 strength in LUE 1/5 in RUE.  Pt had no trouble following conversation and responded appropriately. Pt did pause at times to find the appropriate vocabulary. Psych: appropriate mood/affect  Brief Hospital Course:  Patient with hx of prior ischemic stroke (on plavix/aspirin) with persistent right sided deficits presented to ED via EMS after noting acute aphasia at home which resolved by the time they were evaluated by ED physician.  Baseline per patient/husband is 0/5 strength in right arm, 4/5 in right leg.  Patient felt that from a focal symptom standpoint she was at baseline but she consistently claimed a generalized weakness and to staff her speech appeared slightly delayed but appropriate.  Stroke team was consulted and TPA was not given.   Patient was stable, initial CT and CXR were neg for acute process.  MRI showed chronic atrophy and ischemic damage but no acute infarct, EEG showed diffuse slowing consistent with either chronic damage or potential toxicity but no seizure activity.  Based on interview and presentation there was no suspicion of ingestion.  On day 2 of admission (7/2) she had a second episode of aphasia and neurology was paged, it resolved spontaneously and repeat CT was neg for new bleed.  With stable clinical signs and patient on chronic antiplatelet medications neurology signed off for d/c after 24hrs with no new symptoms and a plan to followup in 4wks outpatient.  Issues for Follow Up:  1. No new antiplatelet medications, continue ASA 81 and plavix 75  2. Followup with neurology in 4 wks. 3. Gallbladder surgery scheduled for 7/12 but should be rescheduled due to high risk of CVA in the two weeks following TIA.  Last TIA 7/2 please reschedule surgery after 7/16.  Significant Procedures: none  Significant Labs and Imaging:  Recent Labs  Lab 09/17/17 1213 09/17/17 1216 09/18/17 0533 09/20/17 0332  WBC 5.7  --  4.9 5.6  HGB 12.5 13.3 11.5* 10.3*  HCT 41.4 39.0 38.0 34.3*  PLT 199  --  173 157   Recent Labs  Lab 09/17/17 1213 09/17/17 1216 09/18/17 0533 09/20/17 0332  NA 139 138 139 142  K 4.8 4.7 3.8 3.9  CL 104 104 106 109  CO2 27  --  27 26  GLUCOSE 96 94 87 103*  BUN 13 20 11 8   CREATININE 0.83 0.80 0.77 0.76  CALCIUM 8.8*  --  8.3* 8.8*  ALKPHOS 79  --   --   --   AST 19  --   --   --  ALT 14  --   --   --   ALBUMIN 3.3*  --   --   --     Ct Angio Head W Or Wo Contrast  Result Date: 09/18/2017 CLINICAL DATA:  Followup stroke.  Confusion and slurred speech. EXAM: CT ANGIOGRAPHY HEAD AND NECK TECHNIQUE: Multidetector CT imaging of the head and neck was performed using the standard protocol during bolus administration of intravenous contrast. Multiplanar CT image reconstructions and MIPs  were obtained to evaluate the vascular anatomy. Carotid stenosis measurements (when applicable) are obtained utilizing NASCET criteria, using the distal internal carotid diameter as the denominator. CONTRAST:  30mL ISOVUE-370 IOPAMIDOL (ISOVUE-370) INJECTION 76% COMPARISON:  MRI same day.  CT yesterday. FINDINGS: CTA NECK FINDINGS Aortic arch: Aortic atherosclerosis. No aneurysm or dissection. Brachiocephalic branching pattern is normal without origin stenosis. Right carotid system: Common carotid artery widely patent to bifurcation. There is mild calcified plaque at the carotid bifurcation and ICA bulb but no stenosis. Cervical ICA is tortuous, swinging to the midline. There is no stenosis. Left carotid system: Common carotid artery is widely patent to the bifurcation. At the carotid bifurcation, there is calcified plaque but no stenosis. The cervical internal carotid artery is tortuous but widely patent. Vertebral arteries: Both vertebral artery origins are patent. Left vertebral artery is dominant. There is minimal narrowing at the left vertebral origin but no significant stenosis. Both vertebral arteries are widely patent to the foramen magnum. Skeleton: Ordinary cervical spondylosis. Other neck: No mass or lymphadenopathy. Upper chest: Negative Review of the MIP images confirms the above findings CTA HEAD FINDINGS Anterior circulation: Both internal carotid arteries are patent through the skull base and siphon regions. There is atherosclerotic calcification in both carotid siphon regions but no stenosis. The anterior and middle cerebral vessels are patent. Both A1 segments show diffuse narrowing and atherosclerotic irregularity without flow limiting stenosis. There is narrowing of the M2 vessels on the right. On the left, there is severe stenosis of the distal M1 segment with diminished visualization of the left middle cerebral artery branches. Posterior circulation: Both vertebral arteries are patent through  the foramen magnum to the basilar artery with the left being dominant. No basilar stenosis. Superior cerebellar and posterior cerebral arteries are patent, without proximal abnormality. More distal branch vessels show atherosclerotic irregularity. Venous sinuses: Patent and normal. Anatomic variants: None significant. Delayed phase: No abnormal enhancement. Review of the MIP images confirms the above findings IMPRESSION: Atherosclerosis of the aorta and carotid bifurcations but without advanced disease, significant stenosis or significant irregularity. Cervical internal carotid arteries are tortuous, as might be seen with chronic hypertension. Distal vessel intracranial atherosclerotic disease, consistent with medium to small vessel atherosclerosis. The exception to this is focal stenosis at the distal M1 segment on the left, with diminished visualization of the more distal left middle cerebral artery branches. On the right, the M1 segment appears normal caliber but there is diffuse narrowing beginning at the M2 branches. Electronically Signed   By: Nelson Chimes M.D.   On: 09/18/2017 10:25   Ct Head Wo Contrast  Result Date: 09/18/2017 CLINICAL DATA:  Aphasia. EXAM: CT HEAD WITHOUT CONTRAST TECHNIQUE: Contiguous axial images were obtained from the base of the skull through the vertex without intravenous contrast. COMPARISON:  Brain MRI 09/18/2017 and CT 09/17/2017 FINDINGS: Brain: Chronic infarcts are again seen in the basal ganglia and cerebellum bilaterally. There is involvement of the left insula as well. No definite acute infarct, acute intracranial hemorrhage, mass, midline shift, or  extra-axial fluid collection is identified. There is ex vacuo enlargement of both lateral ventricles. Patchy chronic small vessel ischemic disease is again noted in the cerebral white matter bilaterally. Vascular: Calcified atherosclerosis at the skull base. No hyperdense vessel. Skull: No fracture or focal osseous lesion.  Sinuses/Orbits: Visualized paranasal sinuses and mastoid air cells are clear. Bilateral cataract extraction is noted. Other: None. IMPRESSION: 1. No evidence of acute intracranial abnormality. 2. Chronic infarcts as above. Electronically Signed   By: Logan Bores M.D.   On: 09/18/2017 18:30   Ct Angio Neck W Or Wo Contrast  Result Date: 09/18/2017 CLINICAL DATA:  Followup stroke.  Confusion and slurred speech. EXAM: CT ANGIOGRAPHY HEAD AND NECK TECHNIQUE: Multidetector CT imaging of the head and neck was performed using the standard protocol during bolus administration of intravenous contrast. Multiplanar CT image reconstructions and MIPs were obtained to evaluate the vascular anatomy. Carotid stenosis measurements (when applicable) are obtained utilizing NASCET criteria, using the distal internal carotid diameter as the denominator. CONTRAST:  22mL ISOVUE-370 IOPAMIDOL (ISOVUE-370) INJECTION 76% COMPARISON:  MRI same day.  CT yesterday. FINDINGS: CTA NECK FINDINGS Aortic arch: Aortic atherosclerosis. No aneurysm or dissection. Brachiocephalic branching pattern is normal without origin stenosis. Right carotid system: Common carotid artery widely patent to bifurcation. There is mild calcified plaque at the carotid bifurcation and ICA bulb but no stenosis. Cervical ICA is tortuous, swinging to the midline. There is no stenosis. Left carotid system: Common carotid artery is widely patent to the bifurcation. At the carotid bifurcation, there is calcified plaque but no stenosis. The cervical internal carotid artery is tortuous but widely patent. Vertebral arteries: Both vertebral artery origins are patent. Left vertebral artery is dominant. There is minimal narrowing at the left vertebral origin but no significant stenosis. Both vertebral arteries are widely patent to the foramen magnum. Skeleton: Ordinary cervical spondylosis. Other neck: No mass or lymphadenopathy. Upper chest: Negative Review of the MIP images  confirms the above findings CTA HEAD FINDINGS Anterior circulation: Both internal carotid arteries are patent through the skull base and siphon regions. There is atherosclerotic calcification in both carotid siphon regions but no stenosis. The anterior and middle cerebral vessels are patent. Both A1 segments show diffuse narrowing and atherosclerotic irregularity without flow limiting stenosis. There is narrowing of the M2 vessels on the right. On the left, there is severe stenosis of the distal M1 segment with diminished visualization of the left middle cerebral artery branches. Posterior circulation: Both vertebral arteries are patent through the foramen magnum to the basilar artery with the left being dominant. No basilar stenosis. Superior cerebellar and posterior cerebral arteries are patent, without proximal abnormality. More distal branch vessels show atherosclerotic irregularity. Venous sinuses: Patent and normal. Anatomic variants: None significant. Delayed phase: No abnormal enhancement. Review of the MIP images confirms the above findings IMPRESSION: Atherosclerosis of the aorta and carotid bifurcations but without advanced disease, significant stenosis or significant irregularity. Cervical internal carotid arteries are tortuous, as might be seen with chronic hypertension. Distal vessel intracranial atherosclerotic disease, consistent with medium to small vessel atherosclerosis. The exception to this is focal stenosis at the distal M1 segment on the left, with diminished visualization of the more distal left middle cerebral artery branches. On the right, the M1 segment appears normal caliber but there is diffuse narrowing beginning at the M2 branches. Electronically Signed   By: Nelson Chimes M.D.   On: 09/18/2017 10:25   Mr Brain Wo Contrast  Result Date: 09/18/2017 CLINICAL DATA:  TIA.  Confusion and slurring of speech. EXAM: MRI HEAD WITHOUT CONTRAST TECHNIQUE: Multiplanar, multiecho pulse sequences  of the brain and surrounding structures were obtained without intravenous contrast. COMPARISON:  Head CT 09/17/2017 FINDINGS: BRAIN: There is no acute infarct, acute hemorrhage or mass effect. The midline structures are normal. Multiple old bilateral cerebellar infarcts. Bilateral old basal ganglia infarcts. Early confluent hyperintense T2-weighted signal of the periventricular and deep white matter, most commonly due to chronic ischemic microangiopathy. Generalized atrophy without lobar predilection. Susceptibility-sensitive sequences show no chronic microhemorrhage or superficial siderosis. VASCULAR: Major intracranial arterial and venous sinus flow voids are preserved. SKULL AND UPPER CERVICAL SPINE: The visualized skull base, calvarium, upper cervical spine and extracranial soft tissues are normal. SINUSES/ORBITS: Small amount of fluid in both mastoids. Paranasal sinuses are clear. There are bilateral lens replacements. IMPRESSION: 1. No acute abnormality. 2. Multiple old infarcts and sequelae of chronic ischemic microangiopathy. 3. Generalized atrophy without lobar predilection. Electronically Signed   By: Ulyses Jarred M.D.   On: 09/18/2017 01:11   Dg Chest Portable 1 View  Result Date: 09/17/2017 CLINICAL DATA:  Altered mental status. EXAM: PORTABLE CHEST 1 VIEW COMPARISON:  08/22/2015 FINDINGS: The heart size and mediastinal contours are within normal limits. Both lungs are clear. The visualized skeletal structures are unremarkable. Calcification in the arch of the aorta. Surgical clips in the left axilla from previous breast surgery. IMPRESSION: No acute abnormalities. Aortic Atherosclerosis (ICD10-I70.0). Electronically Signed   By: Lorriane Shire M.D.   On: 09/17/2017 13:24   Ct Head Code Stroke Wo Contrast  Result Date: 09/17/2017 CLINICAL DATA:  Code stroke.  Altered level of consciousness EXAM: CT HEAD WITHOUT CONTRAST TECHNIQUE: Contiguous axial images were obtained from the base of the skull  through the vertex without intravenous contrast. COMPARISON:  None. FINDINGS: Brain: Ill-defined, chronic appearing remote infarcts in the bilateral basal ganglia and corona radiata, also involving the insular cortex on the left. Patchy small remote bilateral cerebellar infarcts. No evidence of acute infarct. No hemorrhage, hydrocephalus, or masslike finding Vascular: Atherosclerotic calcification.  No hyperdense vessel Skull: Negative Sinuses/Orbits: Postoperative globes.  No acute finding Other: These results were communicated to Dr. Cheral Marker at 12:25 pmon 7/1/2019by text page via the [facility] messaging system. ASPECTS [facility] Stroke Program Early CT Score) Not scored with this history IMPRESSION: 1. No acute finding. 2. Remote infarcts in the bilateral basal ganglia, left insula, and bilateral cerebellum. Electronically Signed   By: Monte Fantasia M.D.   On: 09/17/2017 12:25    Results/Tests Pending at Time of Discharge: none  Discharge Medications:  Allergies as of 09/20/2017      Reactions   Ceclor [cefaclor] Other (See Comments)   REACTION: "SEVERE HEADACHE"      Medication List    TAKE these medications   acetaminophen 500 MG tablet Commonly known as:  TYLENOL Take 500 mg by mouth daily as needed for mild pain.   aspirin 81 MG tablet Take 81 mg by mouth every evening.   atorvastatin 40 MG tablet Commonly known as:  LIPITOR Take 1 tablet (40 mg total) by mouth at bedtime. What changed:    medication strength  how much to take   buPROPion 150 MG 12 hr tablet Commonly known as:  WELLBUTRIN SR Take 1 tablet by mouth daily.   clopidogrel 75 MG tablet Commonly known as:  PLAVIX Take 1 tablet (75 mg total) by mouth at bedtime.   ezetimibe 10 MG tablet Commonly known as:  ZETIA Take 10 mg  by mouth at bedtime.   isosorbide mononitrate 60 MG 24 hr tablet Commonly known as:  IMDUR TAKE 1 TABLET BY MOUTH DAILY   nitroGLYCERIN 0.4 MG SL tablet Commonly known as:   NITROSTAT Place 0.4 mg under the tongue every 5 (five) minutes as needed for chest pain (Call 911 at 3rd dose within 15 minutes).   OSCAL 500/200 D-3 PO Take 1 tablet by mouth every evening.   RABEprazole 20 MG tablet Commonly known as:  ACIPHEX Take 20 mg by mouth at bedtime.   sertraline 100 MG tablet Commonly known as:  ZOLOFT Take 100 mg by mouth daily.   SPIRIVA HANDIHALER 18 MCG inhalation capsule Generic drug:  tiotropium Place 18 mcg into inhaler and inhale daily.       Discharge Instructions: Please refer to Patient Instructions section of EMR for full details.  Patient was counseled important signs and symptoms that should prompt return to medical care, changes in medications, dietary instructions, activity restrictions, and follow up appointments.   Follow-Up Appointments: Follow-up Information    [facility] Follow up in 4 week(s).   Specialty:  Neurology Why:  stroke clinic. office will call with appt date and time. Contact information: [address] [place] [phone]       Velna Hatchet, MD. Schedule an appointment as soon as possible for a visit.   Specialty:  Internal Medicine Contact information: [place] [address] [phone]        Belva Crome, MD .   Specialty:  Cardiology Contact information: [id number] [address] [place] [address] [phone]           Matilde Haymaker, MD 09/20/2017, 8:46 PM PGY-1, [place]

## 2017-09-20 NOTE — Progress Notes (Signed)
Physical Therapy Treatment Patient Details Name: Brandy Paul MRN: [id number] DOB: Dec 25, 1945 Today's Date: 09/20/2017    History of Present Illness Patient is a 72 y/o female admitted with confusion and slurred speech. CT revealing no acute intracranial abnormalities, MRI revealing no acute abnormality but with multiple old infarcts. Patient with a PMH significant for prior stroke with chronic R sided deficits, anxiety/depression, coronary spasm, T2DM, HLD. 7/2 pm RN reported acute neuro change. CT no acute changes. Dr Erlinda Hong placed order for bed flat with SBP goal 140s.     PT Comments    Brandy Paul doing well today - reports improved pain at L hand, but does require motivation to perform mobility activities today as patient does plan to return home and will need to be mobile. Patient generally min guard for ambulation only for general stability but no physical assist. Does require Min A for bed mobility, but with patient and husband reporting mobile bed at home that does assist patient. PT recommendations remain appropriate.    Follow Up Recommendations  Home health PT;Supervision/Assistance - 24 hour     Equipment Recommendations  None recommended by PT    Recommendations for Other Services       Precautions / Restrictions Precautions Precautions: Fall Precaution Comments: watch BP; hand contracture; painful L hand Restrictions Weight Bearing Restrictions: No    Mobility  Bed Mobility Overal bed mobility: Needs Assistance Bed Mobility: Supine to Sit     Supine to sit: Min assist;HOB elevated     General bed mobility comments: patient pulling up on PT; motivation to perform transfer as patient wanting to stay in bed  Transfers Overall transfer level: Needs assistance Equipment used: Quad cane Transfers: Sit to/from Stand Sit to Stand: From elevated surface;Min assist;Min guard         General transfer comment: Min A to min guard with verbal cueing for motivation;  improved L hand pain with ability to push up and use QC with improved tolerance today  Ambulation/Gait Ambulation/Gait assistance: Min guard Gait Distance (Feet): 25 Feet Assistive device: Quad cane Gait Pattern/deviations: Step-to pattern;Decreased step length - right;Decreased step length - left;Decreased dorsiflexion - right;Decreased weight shift to right Gait velocity: decreased   General Gait Details: improved L hand pain today with greater tolerance to weight bearing on QC. Generally Min guard for safety with no LOB during gait; motivation to ambulate further as patient does plan to return home and will need to be ambulatory   Stairs             Wheelchair Mobility    Modified Rankin (Stroke Patients Only)       Balance Overall balance assessment: Needs assistance Sitting-balance support: No upper extremity supported;Feet supported Sitting balance-Leahy Scale: Good     Standing balance support: Bilateral upper extremity supported;During functional activity Standing balance-Leahy Scale: Fair                              Cognition Arousal/Alertness: Awake/alert Behavior During Therapy: Flat affect Overall Cognitive Status: Within Functional Limits for tasks assessed                                 General Comments: most likely baseline      Exercises      General Comments        Pertinent Vitals/Pain Pain Assessment: Faces Faces Pain Scale:  Hurts a little bit Pain Location: L hand Pain Descriptors / Indicators: Sore Pain Intervention(s): Limited activity within patient's tolerance;Monitored during session;Ice applied    Home Living                      Prior Function            PT Goals (current goals can now be found in the care plan section) Acute Rehab PT Goals Patient Stated Goal: to go home PT Goal Formulation: With patient Time For Goal Achievement: 10/02/17 Potential to Achieve Goals: Good Progress  towards PT goals: Progressing toward goals    Frequency    Min 4X/week      PT Plan Current plan remains appropriate    Co-evaluation              AM-PAC PT "6 Clicks" Daily Activity  Outcome Measure  Difficulty turning over in bed (including adjusting bedclothes, sheets and blankets)?: A Little Difficulty moving from lying on back to sitting on the side of the bed? : Unable Difficulty sitting down on and standing up from a chair with arms (e.g., wheelchair, bedside commode, etc,.)?: Unable Help needed moving to and from a bed to chair (including a wheelchair)?: A Little Help needed walking in hospital room?: A Little Help needed climbing 3-5 steps with a railing? : A Lot 6 Click Score: 13    End of Session Equipment Utilized During Treatment: Gait belt Activity Tolerance: Patient tolerated treatment well Patient left: in chair;with call bell/phone within reach;with chair alarm set;with family/visitor present Nurse Communication: Mobility status PT Visit Diagnosis: Unsteadiness on feet (R26.81);Other abnormalities of gait and mobility (R26.89);Muscle weakness (generalized) (M62.81)     Time: 2620-3559 PT Time Calculation (min) (ACUTE ONLY): 26 min  Charges:  $Gait Training: 8-22 mins $Therapeutic Activity: 8-22 mins                    G Codes:       Lanney Gins, PT, DPT 09/20/17 10:29 AM

## 2017-09-20 NOTE — Progress Notes (Signed)
Occupational Therapy Treatment Patient Details Name: Brandy Paul MRN: [id number] DOB: [birth year] Today's Date: 09/20/2017    History of present illness Patient is a 72 y/o female admitted with confusion and slurred speech. CT revealing no acute intracranial abnormalities, MRI revealing no acute abnormality but with multiple old infarcts. Patient with a PMH significant for prior stroke with chronic R sided deficits, anxiety/depression, coronary spasm, T2DM, HLD. 7/2 pm RN reported acute neuro change. CT no acute changes. Dr Erlinda Hong placed order for bed flat with SBP goal 140s.    OT comments  Focus of session on family education and modification of palm guard. Pt/husband able to return demonstrate donning/doffing palm guard and proper wearing time and care of palm guard. Pt/husband educated on importance of being aware of skin integrity and given strategies to reduce moisture associated skin damage. Pt to continue with HHOT.   Follow Up Recommendations  Home health OT;Supervision/Assistance - 24 hour    Equipment Recommendations       Recommendations for Other Services      Precautions / Restrictions Precautions Precautions: Fall       Mobility Bed Mobility                  Transfers                      Balance                                           ADL either performed or assessed with clinical judgement   ADL                                               Vision       Perception     Praxis      Cognition Arousal/Alertness: Awake/alert Behavior During Therapy: Flat affect Overall Cognitive Status: Within Functional Limits for tasks assessed                                 General Comments: most likely baseline        Exercises     Shoulder Instructions       General Comments  Palm guard adapted with velfoam strap through red tubing to better position digits on guard. Due to flexor  synergy, pt pulls off guard. Pt/husband instructed to remove palm guard several times/day to check skin integrity. Pt/husband verbalized understanding.     Pertinent Vitals/ Pain       Pain Assessment: Faces Faces Pain Scale: Hurts a little bit Pain Location: L hand Pain Descriptors / Indicators: Sore Pain Intervention(s): Limited activity within patient's tolerance  Home Living                                          Prior Functioning/Environment              Frequency           Progress Toward Goals  OT Goals(current goals can now be found in the care plan section)  Progress towards OT goals: Goals  met/education completed, patient discharged from [facility] for [facility]. Continue with HHOT)  Acute Rehab OT Goals Patient Stated Goal: to go home OT Goal Formulation: With patient Time For Goal Achievement: 10/02/17 Potential to Achieve Goals: Good ADL Goals Pt Will Transfer to Toilet: with min guard assist;ambulating Pt Will Perform Tub/Shower Transfer: with min assist;ambulating Additional ADL Goal #1: Pt/caregiver will demonstrate ability to properly care for pt's R hand to prevent skin breakdown. Additional ADL Goal #2: Pt/caregiver will demonstrate 3 strategies for fall prevention. Additional ADL Goal #3: Pt/caregiver will independently demonstrate management of R palm guard and R hand.  Plan Discharge plan remains appropriate;Frequency needs to be updated    Co-evaluation                 AM-PAC PT "6 Clicks" Daily Activity     Outcome Measure   Help from another person eating meals?: A Little Help from another person taking care of personal grooming?: A Little Help from another person toileting, which includes using toliet, bedpan, or urinal?: A Little Help from another person bathing (including washing, rinsing, drying)?: A Lot Help from another person to put on and taking off regular upper body clothing?: A Little Help from another  person to put on and taking off regular lower body clothing?: A Lot 6 Click Score: 16    End of Session    OT Visit Diagnosis: Unsteadiness on feet (R26.81);Other abnormalities of gait and mobility (R26.89);Muscle weakness (generalized) (M62.81);Other symptoms and signs involving cognitive function;Cognitive communication deficit (R41.841);Hemiplegia and hemiparesis;Pain Symptoms and signs involving cognitive functions: Cerebral infarction(old) Hemiplegia - Right/Left: Right Hemiplegia - caused by: Cerebral infarction Pain - Right/Left: Left Pain - part of body: Hand   Activity Tolerance Patient tolerated treatment well   Patient Left in bed;with call bell/phone within reach;with family/visitor present   Nurse Communication Other (comment)(ready to DC)        Time: 4656-8127 OT Time Calculation (min): 30 min  Charges: OT General Charges $OT Visit: 1 Visit OT Treatments $Therapeutic Activity: 23-37 mins  Maurie Boettcher, OT/L  OT Clinical Specialist [phone]    [facility] 09/20/2017, 3:52 PM

## 2017-09-20 NOTE — Progress Notes (Signed)
Discharge instruction reviewed with pt n family. All question answered at this time. Transport home by family.  Ave Filter, RN

## 2017-09-21 ENCOUNTER — Other Ambulatory Visit: Payer: Self-pay | Admitting: Family Medicine

## 2017-09-24 DIAGNOSIS — M810 Age-related osteoporosis without current pathological fracture: Secondary | ICD-10-CM | POA: Diagnosis not present

## 2017-09-24 DIAGNOSIS — Z9181 History of falling: Secondary | ICD-10-CM | POA: Diagnosis not present

## 2017-09-24 DIAGNOSIS — N393 Stress incontinence (female) (male): Secondary | ICD-10-CM | POA: Diagnosis not present

## 2017-09-24 DIAGNOSIS — Z853 Personal history of malignant neoplasm of breast: Secondary | ICD-10-CM | POA: Diagnosis not present

## 2017-09-24 DIAGNOSIS — I10 Essential (primary) hypertension: Secondary | ICD-10-CM | POA: Diagnosis not present

## 2017-09-24 DIAGNOSIS — I2510 Atherosclerotic heart disease of native coronary artery without angina pectoris: Secondary | ICD-10-CM | POA: Diagnosis not present

## 2017-09-24 DIAGNOSIS — I69351 Hemiplegia and hemiparesis following cerebral infarction affecting right dominant side: Secondary | ICD-10-CM | POA: Diagnosis not present

## 2017-09-24 DIAGNOSIS — Z7902 Long term (current) use of antithrombotics/antiplatelets: Secondary | ICD-10-CM | POA: Diagnosis not present

## 2017-09-24 DIAGNOSIS — F3289 Other specified depressive episodes: Secondary | ICD-10-CM | POA: Diagnosis not present

## 2017-09-24 DIAGNOSIS — Z7982 Long term (current) use of aspirin: Secondary | ICD-10-CM | POA: Diagnosis not present

## 2017-09-24 DIAGNOSIS — I69320 Aphasia following cerebral infarction: Secondary | ICD-10-CM | POA: Diagnosis not present

## 2017-09-24 DIAGNOSIS — J432 Centrilobular emphysema: Secondary | ICD-10-CM | POA: Diagnosis not present

## 2017-09-24 DIAGNOSIS — Z87891 Personal history of nicotine dependence: Secondary | ICD-10-CM | POA: Diagnosis not present

## 2017-09-24 DIAGNOSIS — D508 Other iron deficiency anemias: Secondary | ICD-10-CM | POA: Diagnosis not present

## 2017-09-24 DIAGNOSIS — F329 Major depressive disorder, single episode, unspecified: Secondary | ICD-10-CM | POA: Diagnosis not present

## 2017-09-24 DIAGNOSIS — K8000 Calculus of gallbladder with acute cholecystitis without obstruction: Secondary | ICD-10-CM | POA: Diagnosis not present

## 2017-09-24 DIAGNOSIS — K3580 Unspecified acute appendicitis: Secondary | ICD-10-CM | POA: Diagnosis not present

## 2017-09-24 DIAGNOSIS — D509 Iron deficiency anemia, unspecified: Secondary | ICD-10-CM | POA: Diagnosis not present

## 2017-09-24 DIAGNOSIS — E119 Type 2 diabetes mellitus without complications: Secondary | ICD-10-CM | POA: Diagnosis not present

## 2017-09-26 ENCOUNTER — Other Ambulatory Visit (HOSPITAL_COMMUNITY): Payer: Medicare Other

## 2017-09-26 DIAGNOSIS — E7849 Other hyperlipidemia: Secondary | ICD-10-CM | POA: Diagnosis not present

## 2017-09-26 DIAGNOSIS — I69961 Other paralytic syndrome following unspecified cerebrovascular disease affecting right dominant side: Secondary | ICD-10-CM | POA: Diagnosis not present

## 2017-09-26 DIAGNOSIS — R079 Chest pain, unspecified: Secondary | ICD-10-CM | POA: Diagnosis not present

## 2017-09-26 DIAGNOSIS — G459 Transient cerebral ischemic attack, unspecified: Secondary | ICD-10-CM | POA: Diagnosis not present

## 2017-09-26 DIAGNOSIS — K819 Cholecystitis, unspecified: Secondary | ICD-10-CM | POA: Diagnosis not present

## 2017-09-26 DIAGNOSIS — Z6836 Body mass index (BMI) 36.0-36.9, adult: Secondary | ICD-10-CM | POA: Diagnosis not present

## 2017-09-26 DIAGNOSIS — D509 Iron deficiency anemia, unspecified: Secondary | ICD-10-CM | POA: Diagnosis not present

## 2017-09-26 DIAGNOSIS — J449 Chronic obstructive pulmonary disease, unspecified: Secondary | ICD-10-CM | POA: Diagnosis not present

## 2017-09-28 ENCOUNTER — Ambulatory Visit (HOSPITAL_COMMUNITY): Admission: RE | Admit: 2017-09-28 | Payer: Medicare Other | Source: Ambulatory Visit | Admitting: Surgery

## 2017-09-28 ENCOUNTER — Encounter (HOSPITAL_COMMUNITY): Admission: RE | Payer: Self-pay | Source: Ambulatory Visit

## 2017-09-28 SURGERY — LAPAROSCOPIC CHOLECYSTECTOMY
Anesthesia: General

## 2017-10-01 DIAGNOSIS — M810 Age-related osteoporosis without current pathological fracture: Secondary | ICD-10-CM | POA: Diagnosis not present

## 2017-10-01 DIAGNOSIS — E119 Type 2 diabetes mellitus without complications: Secondary | ICD-10-CM | POA: Diagnosis not present

## 2017-10-01 DIAGNOSIS — I2510 Atherosclerotic heart disease of native coronary artery without angina pectoris: Secondary | ICD-10-CM | POA: Diagnosis not present

## 2017-10-01 DIAGNOSIS — I69351 Hemiplegia and hemiparesis following cerebral infarction affecting right dominant side: Secondary | ICD-10-CM | POA: Diagnosis not present

## 2017-10-01 DIAGNOSIS — I69320 Aphasia following cerebral infarction: Secondary | ICD-10-CM | POA: Diagnosis not present

## 2017-10-01 DIAGNOSIS — J432 Centrilobular emphysema: Secondary | ICD-10-CM | POA: Diagnosis not present

## 2017-10-04 DIAGNOSIS — M810 Age-related osteoporosis without current pathological fracture: Secondary | ICD-10-CM | POA: Diagnosis not present

## 2017-10-04 DIAGNOSIS — I69351 Hemiplegia and hemiparesis following cerebral infarction affecting right dominant side: Secondary | ICD-10-CM | POA: Diagnosis not present

## 2017-10-04 DIAGNOSIS — I69320 Aphasia following cerebral infarction: Secondary | ICD-10-CM | POA: Diagnosis not present

## 2017-10-04 DIAGNOSIS — I2510 Atherosclerotic heart disease of native coronary artery without angina pectoris: Secondary | ICD-10-CM | POA: Diagnosis not present

## 2017-10-04 DIAGNOSIS — E119 Type 2 diabetes mellitus without complications: Secondary | ICD-10-CM | POA: Diagnosis not present

## 2017-10-04 DIAGNOSIS — J432 Centrilobular emphysema: Secondary | ICD-10-CM | POA: Diagnosis not present

## 2017-10-10 DIAGNOSIS — I69351 Hemiplegia and hemiparesis following cerebral infarction affecting right dominant side: Secondary | ICD-10-CM | POA: Diagnosis not present

## 2017-10-10 DIAGNOSIS — I2510 Atherosclerotic heart disease of native coronary artery without angina pectoris: Secondary | ICD-10-CM | POA: Diagnosis not present

## 2017-10-10 DIAGNOSIS — I69320 Aphasia following cerebral infarction: Secondary | ICD-10-CM | POA: Diagnosis not present

## 2017-10-10 DIAGNOSIS — E119 Type 2 diabetes mellitus without complications: Secondary | ICD-10-CM | POA: Diagnosis not present

## 2017-10-10 DIAGNOSIS — J432 Centrilobular emphysema: Secondary | ICD-10-CM | POA: Diagnosis not present

## 2017-10-10 DIAGNOSIS — M810 Age-related osteoporosis without current pathological fracture: Secondary | ICD-10-CM | POA: Diagnosis not present

## 2017-10-12 DIAGNOSIS — E119 Type 2 diabetes mellitus without complications: Secondary | ICD-10-CM | POA: Diagnosis not present

## 2017-10-12 DIAGNOSIS — M810 Age-related osteoporosis without current pathological fracture: Secondary | ICD-10-CM | POA: Diagnosis not present

## 2017-10-12 DIAGNOSIS — I69320 Aphasia following cerebral infarction: Secondary | ICD-10-CM | POA: Diagnosis not present

## 2017-10-12 DIAGNOSIS — I2510 Atherosclerotic heart disease of native coronary artery without angina pectoris: Secondary | ICD-10-CM | POA: Diagnosis not present

## 2017-10-12 DIAGNOSIS — J432 Centrilobular emphysema: Secondary | ICD-10-CM | POA: Diagnosis not present

## 2017-10-12 DIAGNOSIS — I69351 Hemiplegia and hemiparesis following cerebral infarction affecting right dominant side: Secondary | ICD-10-CM | POA: Diagnosis not present

## 2017-10-15 DIAGNOSIS — E119 Type 2 diabetes mellitus without complications: Secondary | ICD-10-CM | POA: Diagnosis not present

## 2017-10-15 DIAGNOSIS — J432 Centrilobular emphysema: Secondary | ICD-10-CM | POA: Diagnosis not present

## 2017-10-15 DIAGNOSIS — I69320 Aphasia following cerebral infarction: Secondary | ICD-10-CM | POA: Diagnosis not present

## 2017-10-15 DIAGNOSIS — M810 Age-related osteoporosis without current pathological fracture: Secondary | ICD-10-CM | POA: Diagnosis not present

## 2017-10-15 DIAGNOSIS — I2510 Atherosclerotic heart disease of native coronary artery without angina pectoris: Secondary | ICD-10-CM | POA: Diagnosis not present

## 2017-10-15 DIAGNOSIS — I69351 Hemiplegia and hemiparesis following cerebral infarction affecting right dominant side: Secondary | ICD-10-CM | POA: Diagnosis not present

## 2017-10-17 DIAGNOSIS — I69320 Aphasia following cerebral infarction: Secondary | ICD-10-CM | POA: Diagnosis not present

## 2017-10-17 DIAGNOSIS — I2510 Atherosclerotic heart disease of native coronary artery without angina pectoris: Secondary | ICD-10-CM | POA: Diagnosis not present

## 2017-10-17 DIAGNOSIS — E119 Type 2 diabetes mellitus without complications: Secondary | ICD-10-CM | POA: Diagnosis not present

## 2017-10-17 DIAGNOSIS — M810 Age-related osteoporosis without current pathological fracture: Secondary | ICD-10-CM | POA: Diagnosis not present

## 2017-10-17 DIAGNOSIS — I69351 Hemiplegia and hemiparesis following cerebral infarction affecting right dominant side: Secondary | ICD-10-CM | POA: Diagnosis not present

## 2017-10-17 DIAGNOSIS — J432 Centrilobular emphysema: Secondary | ICD-10-CM | POA: Diagnosis not present

## 2017-10-22 DIAGNOSIS — Z6836 Body mass index (BMI) 36.0-36.9, adult: Secondary | ICD-10-CM | POA: Diagnosis not present

## 2017-10-22 DIAGNOSIS — M25532 Pain in left wrist: Secondary | ICD-10-CM | POA: Diagnosis not present

## 2017-10-22 DIAGNOSIS — W19XXXA Unspecified fall, initial encounter: Secondary | ICD-10-CM | POA: Diagnosis not present

## 2017-10-22 DIAGNOSIS — I69961 Other paralytic syndrome following unspecified cerebrovascular disease affecting right dominant side: Secondary | ICD-10-CM | POA: Diagnosis not present

## 2017-10-22 DIAGNOSIS — M542 Cervicalgia: Secondary | ICD-10-CM | POA: Diagnosis not present

## 2017-10-24 DIAGNOSIS — E119 Type 2 diabetes mellitus without complications: Secondary | ICD-10-CM | POA: Diagnosis not present

## 2017-10-24 DIAGNOSIS — I69351 Hemiplegia and hemiparesis following cerebral infarction affecting right dominant side: Secondary | ICD-10-CM | POA: Diagnosis not present

## 2017-10-24 DIAGNOSIS — I2510 Atherosclerotic heart disease of native coronary artery without angina pectoris: Secondary | ICD-10-CM | POA: Diagnosis not present

## 2017-10-24 DIAGNOSIS — I69320 Aphasia following cerebral infarction: Secondary | ICD-10-CM | POA: Diagnosis not present

## 2017-10-24 DIAGNOSIS — M810 Age-related osteoporosis without current pathological fracture: Secondary | ICD-10-CM | POA: Diagnosis not present

## 2017-10-24 DIAGNOSIS — J432 Centrilobular emphysema: Secondary | ICD-10-CM | POA: Diagnosis not present

## 2017-10-25 ENCOUNTER — Encounter: Payer: Self-pay | Admitting: Adult Health

## 2017-10-25 ENCOUNTER — Ambulatory Visit (INDEPENDENT_AMBULATORY_CARE_PROVIDER_SITE_OTHER): Payer: Medicare Other | Admitting: Adult Health

## 2017-10-25 VITALS — BP 120/69 | HR 86 | Ht 64.0 in | Wt 204.0 lb

## 2017-10-25 DIAGNOSIS — I10 Essential (primary) hypertension: Secondary | ICD-10-CM

## 2017-10-25 DIAGNOSIS — R4701 Aphasia: Secondary | ICD-10-CM | POA: Diagnosis not present

## 2017-10-25 DIAGNOSIS — E785 Hyperlipidemia, unspecified: Secondary | ICD-10-CM

## 2017-10-25 DIAGNOSIS — I69351 Hemiplegia and hemiparesis following cerebral infarction affecting right dominant side: Secondary | ICD-10-CM | POA: Diagnosis not present

## 2017-10-25 DIAGNOSIS — G459 Transient cerebral ischemic attack, unspecified: Secondary | ICD-10-CM

## 2017-10-25 NOTE — Progress Notes (Signed)
[facility] [address] [place]. [place] [address] [phone]       OFFICE FOLLOW UP NOTE  [name] Date of Birth:  28-May-1945 Medical Record Number:  [id number]   Reason for Referral:  hospital TIA follow up  CHIEF COMPLAINT:  Chief Complaint  Patient presents with  . Transient Ischemic Attack    rm 9, husband- Fred  . Follow-up    hospital FU    HPI: Brandy Paul is being seen today for initial visit in the office for left MCA hypoperfusion in the setting with left M1 stenosis and decreased left MCA branches on 09/17/2017. History obtained from patient and chart review. Reviewed all radiology images and labs personally.  Brandy Paul is a 72 year old female with history of breast cancer, HLD, CAD, stroke in 2013 with mild right-sided deficit and mild expressive aphasia who was admitted for worsening expressive aphasia episode lasting 15 to 20 minutes. CT head showed chronic left BG right caudate head and the bilateral cerebellum infarcts. MRI negative for acute stroke. EF 50 to 55%. CTA head and neck showed left M1 stenosis with decreased left MCA branches, right M1/M2 stenosis,which are likely chronic explaining bilateral chronic infarcts. LDL 75 and A1c 6.0. EEG diffuse slowing and no seizure.  On 09/18/2017, patient was found to have worsening expressive aphasia. Repeat head CT no change. Considered hypoperfusion given BP at 120s with left M1 significant stenosis and decreased MCA branches. She received IVF bolus and infusion, albumin Q6h and lying flat over night.  The following morning patient was back to baseline. Orthostatic vital stable.   It was determined that patient's symptoms are likely left MCA hypoperfusion in the setting of relative low blood pressure.  Recommended to keep BP goal 1 30-1 50.  Patient was on aspirin and Plavix PTA and recommended to continue this at discharge.  It was also recommended to increase Lipitor from 20 mg to 40 mg  daily.  Patient was discharged home in stable condition.  Patient is being seen today for hospital follow-up and is accompanied by her husband.  She states she has been doing well and her expressive aphasia is back to her baseline.  She continues to participate in home physical therapy as she was evaluated by home OT/ST but was told that she does not need their services.  She is currently sitting in wheelchair for which she uses for long distance but uses up walker for ambulation.  She continues to take aspirin without bleeding or bruising.  Continues to take Lipitor without side effects myalgias.  Blood pressure today 120/69.  Patient does not monitor this at home besides when physical therapy comes twice a week.  Highly recommended to monitor this as it was recommended to maintain SBP between 130-150.  Husband plans on going out to purchase BP monitor today.  Encourage patient to increase fluid intake and if BP goes less than 110 to drink water and intake salt and if this does not increase BP then to be seen at that time for stroke prevention due to intracranial stenosis.  As patient states she is constantly fatigued throughout the day, sleeps well at night, and snores it was recommended to undergo sleep study.  Patient declining at this time but was educated on the risk of possible untreated OSA.  Patient may consider this in the future.  She also continues to have right hemiparesis with hand spasticity.  Patient denies spasticity as being debilitating or bothersome and therefore  will not do any interventions at this time.  Patient did recently have a fall last weekend and does have ecchymosis on left orbital area.  She was evaluated by her PCP with all negative imaging.  Denies new or worsening stroke/TIA symptoms.    ROS:   14 system review of systems performed and negative with exception of shortness of breath, snoring and depression  PMH:  Past Medical History:  Diagnosis Date  . Breast cancer  ([place]) 2005   left  . COPD (chronic obstructive pulmonary disease) ([facility])    hx of tobacco use  . Coronary artery spasm ([place])   . Depression   . GERD (gastroesophageal reflux disease)   . Hyperlipemia   . IDA (iron deficiency anemia)    last iron infusion 4 months ago  . Osteoporosis   . 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 Prediabetes    hx of  . Rash    under right breast and groin line using nizoral ointment to q day per dermatology saw few weeks ago  . Stress incontinence wers depends  . Stroke [facility]) 2013   paralytic syndrome of dominant right side  . Toe fracture, left     PSH:  Past Surgical History:  Procedure Laterality Date  . ABDOMINAL HYSTERECTOMY  1989   complete  . COLONOSCOPY WITH PROPOFOL N/A 12/03/2015   Procedure: COLONOSCOPY WITH PROPOFOL;  Surgeon: Mauri Pole, MD;  Location: [facility];  Service: Endoscopy;  Laterality: N/A;  tba before procdcedure  . IR CHOLANGIOGRAM EXISTING TUBE  08/08/2017  . IR CHOLANGIOGRAM EXISTING TUBE  08/27/2017  . IR PERC CHOLECYSTOSTOMY  07/13/2017  . MASTECTOMY Left 2005   1 lymph node removed  . RADIAL KERATOTOMY Bilateral   . ROBOTIC ASSISTED BILATERAL SALPINGO OOPHERECTOMY Bilateral 11/04/2015   Procedure: XI ROBOTIC ASSISTED BILATERAL SALPINGO OOPHORECTOMY;  Surgeon: Everitt Amber, MD;  Location: [facility];  Service: Gynecology;  Laterality: Bilateral;  . TONSILLECTOMY      Social History:  Social History   Socioeconomic History  . Marital status: Married    Spouse name: [name]  . Number of children: 4  . Years of education: 37  . Highest education level: Not on file  Occupational History  . Occupation: retired  Scientific laboratory technician  . Financial resource strain: Not on file  . Food insecurity:    Worry: Not on file    Inability: Not on file  . Transportation needs:    Medical: Not on file    Non-medical: Not on file  Tobacco Use  . Smoking status: Former Smoker    Types: Cigarettes    Last attempt to quit:  02/17/1994    Years since quitting: 23.7  . Smokeless tobacco: Never Used  Substance and Sexual Activity  . Alcohol use: No    Alcohol/week: 0.0 standard drinks  . Drug use: No  . Sexual activity: Never  Lifestyle  . Physical activity:    Days per week: Not on file    Minutes per session: Not on file  . Stress: Not on file  Relationships  . Social connections:    Talks on phone: Not on file    Gets together: Not on file    Attends religious service: Not on file    Active member of club or organization: Not on file    Attends meetings of clubs or organizations: Not on file    Relationship status: Not on file  . Intimate partner violence:  Fear of current or ex partner: Not on file    Emotionally abused: Not on file    Physically abused: Not on file    Forced sexual activity: Not on file  Other Topics Concern  . Not on file  Social History Narrative   Lives with spouse   1-2 cups coffee    Family History:  Family History  Problem Relation Age of Onset  . CVA Mother   . Liver cancer Mother   . Stroke Maternal Grandfather   . Hypertension Sister   . Arthritis Sister   . Diabetes Sister   . Heart attack Sister     Medications:   Current Outpatient Medications on File Prior to Visit  Medication Sig Dispense Refill  . acetaminophen (TYLENOL) 500 MG tablet Take 500 mg by mouth daily as needed for mild pain.     Marland Kitchen aspirin 81 MG tablet Take 81 mg by mouth every evening.     Marland Kitchen atorvastatin (LIPITOR) 40 MG tablet Take 1 tablet (40 mg total) by mouth at bedtime. 30 tablet 0  . buPROPion (WELLBUTRIN SR) 150 MG 12 hr tablet Take 1 tablet by mouth daily.  3  . Calcium Carbonate-Vitamin D (OSCAL 500/200 D-3 PO) Take 1 tablet by mouth every evening.     . clopidogrel (PLAVIX) 75 MG tablet Take 1 tablet (75 mg total) by mouth at bedtime.    Marland Kitchen ezetimibe (ZETIA) 10 MG tablet Take 10 mg by mouth at bedtime.     . isosorbide mononitrate (IMDUR) 60 MG 24 hr tablet TAKE 1 TABLET BY  MOUTH DAILY 90 tablet 3  . RABEprazole (ACIPHEX) 20 MG tablet Take 20 mg by mouth at bedtime.   2  . sertraline (ZOLOFT) 100 MG tablet Take 100 mg by mouth daily.  3  . tiotropium (SPIRIVA HANDIHALER) 18 MCG inhalation capsule Place 18 mcg into inhaler and inhale daily.     . nitroGLYCERIN (NITROSTAT) 0.4 MG SL tablet Place 0.4 mg under the tongue every 5 (five) minutes as needed for chest pain (Call 911 at 3rd dose within 15 minutes).     No current facility-administered medications on file prior to visit.     Allergies:   Allergies  Allergen Reactions  . Ceclor [Cefaclor] Other (See Comments)    REACTION: "SEVERE HEADACHE"     Physical Exam  Vitals:   10/25/17 1408  BP: 120/69  Pulse: 86  Weight: 204 lb (92.5 kg)  Height: 5\' 4"  (1.626 m)   Body mass index is 35.02 kg/m. No exam data present  General: Obese pleasant elderly Caucasian female, seated, in no evident distress Head: head normocephalic and atraumatic.   Neck: supple with no carotid or supraclavicular bruits Cardiovascular: regular rate and rhythm, no murmurs Musculoskeletal: no deformity Skin: Ecchymosis left orbital area Vascular:  Normal pulses all extremities  Neurologic Exam Mental Status: Awake and fully alert.  Moderate expressive aphasia (chronic from previous stroke).  Oriented to place and time. Recent and remote memory intact. Attention span, concentration and fund of knowledge appropriate. Mood and affect appropriate.  Cranial Nerves: Fundoscopic exam reveals sharp disc margins. Pupils equal, briskly reactive to light. Extraocular movements full without nystagmus. Visual fields full to confrontation. Hearing intact. Facial sensation intact. Face, tongue, palate moves normally and symmetrically.  Motor: Normal bulk and tone.  RUE: 2/5 with hand spasticity, RLE: 4/5 (chronic from previous stroke); normal strength on left side Sensory.: intact to touch , pinprick , position and vibratory sensation.  Coordination: Rapid alternating movements normal on left side. Finger-to-nose and heel-to-shin performed accurately on left side. Gait and Station: Arises from chair with mild difficulty. Hemiplegic gait observed due to right hemiparesis and does use cane during appointment for ambulation but will use up walker at home Reflexes: 1+ and symmetric. Toes downgoing.    NIHSS  4 Modified Rankin  3   Diagnostic Data (Labs, Imaging, Testing)  CT head without contrast 09/17/2017 IMPRESSION: 1. No acute finding. 2. Remote infarcts in the bilateral basal ganglia, left insula, and bilateral cerebellum.  MRI brain without contrast 09/18/2017 IMPRESSION: 1. No acute abnormality. 2. Multiple old infarcts and sequelae of chronic ischemic microangiopathy. 3. Generalized atrophy without lobar predilection.  CT angio neck with and without contrast CT angio head with and without contrast 09/18/2017 IMPRESSION: Atherosclerosis of the aorta and carotid bifurcations but without advanced disease, significant stenosis or significant irregularity. Cervical internal carotid arteries are tortuous, as might be seen with chronic hypertension. Distal vessel intracranial atherosclerotic disease, consistent with medium to small vessel atherosclerosis. The exception to this is focal stenosis at the distal M1 segment on the left, with diminished visualization of the more distal left middle cerebral artery branches. On the right, the M1 segment appears normal caliber but there is diffuse narrowing beginning at the M2 branches.    ASSESSMENT: KANCHAN GAL is a 72 y.o. year old female here with TIA with left MCA hypoperfusion on 09/17/17 secondary  to left M1 stenosis and decreased left MCA branches. Vascular risk factors include prior stroke, HLD and prediabetes.  Patient seen today for hospital follow-up and has recovered well from recent admission with only chronic deficits of right hemiparesis and expressive  aphasia from previous stroke.   PLAN: -Continue aspirin 81 mg daily and clopidogrel 75 mg daily  and Lipitor for secondary stroke prevention -F/u with PCP regarding your HLD management -Start to monitor BP at home and ensure SBP 1 30-1 50 -advised if SBP less than 100-110 to increase fluid intake and obtain salt and if continues to be low and symptomatic to be seen BP management -Continue home PT and advised patient that if outpatient PT is needed to call us for additional orders -Maintain strict control of hypertension with blood pressure goal 1 30-1 50, diabetes with hemoglobin A1c goal below 6.5% and cholesterol with LDL cholesterol (bad cholesterol) goal below 70 mg/dL. I also advised the patient to eat a healthy diet with plenty of whole grains, cereals, fruits and vegetables, exercise regularly and maintain ideal body weight.  Follow up in 6 months or call earlier if needed   Greater than 50% of time during this 25 minute visit was spent on counseling,explanation of diagnosis of TIA, reviewing risk factor management of HLD and intracranial stenosis, planning of further management, discussion with patient and family and coordination of care    Venancio Poisson, [facility]  [facility] [address] [place], [place] [address]  Phone [phone] Fax [phone]

## 2017-10-25 NOTE — Patient Instructions (Addendum)
Continue aspirin 81 mg daily and clopidogrel 75 mg daily  and lipitor  for secondary stroke prevention  Continue to follow up with PCP regarding cholesterol and blood pressure management   Continue physical therapy - if you need additional orders for outpatient therapy, please let us know  Start to monitor blood pressure at home - ensure top number is between 130-150   Continue to stay active and maintain a healthy diet  Maintain strict control of hypertension with blood pressure goal below 130/90, diabetes with hemoglobin A1c goal below 6.5% and cholesterol with LDL cholesterol (bad cholesterol) goal below 70 mg/dL. I also advised the patient to eat a healthy diet with plenty of whole grains, cereals, fruits and vegetables, exercise regularly and maintain ideal body weight.  Followup in the future with me in 6 months or call earlier if needed         Thank you for coming to see [place] at [facility]. I hope we have been able to provide you high quality care today.  You may receive a patient satisfaction survey over the next few weeks. We would appreciate your feedback and comments so that we may continue to improve ourselves and the health of our patients.

## 2017-11-01 NOTE — Progress Notes (Signed)
I agree with the above plan 

## 2017-11-05 DIAGNOSIS — L821 Other seborrheic keratosis: Secondary | ICD-10-CM | POA: Diagnosis not present

## 2017-11-05 DIAGNOSIS — D1801 Hemangioma of skin and subcutaneous tissue: Secondary | ICD-10-CM | POA: Diagnosis not present

## 2017-11-07 NOTE — Progress Notes (Signed)
Cardiology Office Note   Date:  11/10/2017   ID:  Tobey, Lippard 05-Apr-1945, MRN [id number]  PCP:  Velna Hatchet, MD  Cardiologist:  Dr. Tamala Julian    Chief Complaint  Patient presents with  . Coronary Artery Disease      History of Present Illness: Brandy Paul is a 72 y.o. female who presents for CAD. Pre-op eval.   She has a history of prior stroke, with Rt sided residula weakness, coronary artery spasm, obesity, and recent normal myocardial perfusion study (August 2017).   Also with DM-2, COPD GERD.   She has been hospitalized for acute appendicitis in Feb 2019, then in 06/2017 acute cholecystitis and had perc drain and antibiotics.    Plans for cholecystectomy and lap appendectomy with clearance from out office.  From cardiac risk it is acceptable with moderate risk.  She does walk at home and no angina.  Her walking is off balance with any speed, just recently fell and had black eye.    Her plavix is not for cardiac reasons but for CVA. Her neurologist will need to decide on plavix.   On 09/17/17 pt had Code stroke for sudden onset of aphasia and rt arm weakness.  With testing was decided it was TIA.    She was continued on ASA and plavix.    She has since seen Dr. Wynn Maudlin -Neuro .  Today in wheelchair and no complaints except she would like to proceed with surgery.  No chest pain and no SOB.  She does have a black eye from walking too fast to the bathroom and falling into the door.  It is now healing the black eye.    Past Medical History:  Diagnosis Date  . Breast cancer ([place]) 2005   left  . COPD (chronic obstructive pulmonary disease) ([facility])    hx of tobacco use  . Coronary artery spasm ([place])   . Depression   . GERD (gastroesophageal reflux disease)   . Hyperlipemia   . IDA (iron deficiency anemia)    last iron infusion 4 months ago  . Osteoporosis   . 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 Prediabetes    hx of  . Rash    under right breast and  groin line using nizoral ointment to q day per dermatology saw few weeks ago  . Stress incontinence wers depends  . Stroke [facility]) 2013   paralytic syndrome of dominant right side  . Toe fracture, left     Past Surgical History:  Procedure Laterality Date  . ABDOMINAL HYSTERECTOMY  1989   complete  . COLONOSCOPY WITH PROPOFOL N/A 12/03/2015   Procedure: COLONOSCOPY WITH PROPOFOL;  Surgeon: Mauri Pole, MD;  Location: [facility] ENDOSCOPY;  Service: Endoscopy;  Laterality: N/A;  tba before procdcedure  . IR CHOLANGIOGRAM EXISTING TUBE  08/08/2017  . IR CHOLANGIOGRAM EXISTING TUBE  08/27/2017  . IR PERC CHOLECYSTOSTOMY  07/13/2017  . MASTECTOMY Left 2005   1 lymph node removed  . RADIAL KERATOTOMY Bilateral   . ROBOTIC ASSISTED BILATERAL SALPINGO OOPHERECTOMY Bilateral 11/04/2015   Procedure: XI ROBOTIC ASSISTED BILATERAL SALPINGO OOPHORECTOMY;  Surgeon: Everitt Amber, MD;  Location: [facility] ORS;  Service: Gynecology;  Laterality: Bilateral;  . TONSILLECTOMY       Current Outpatient Medications  Medication Sig Dispense Refill  . acetaminophen (TYLENOL) 500 MG tablet Take 500 mg by mouth daily as needed for mild pain.     Marland Kitchen aspirin 81 MG tablet Take  81 mg by mouth every evening.     Marland Kitchen atorvastatin (LIPITOR) 40 MG tablet Take 1 tablet (40 mg total) by mouth at bedtime. 30 tablet 0  . buPROPion (WELLBUTRIN SR) 150 MG 12 hr tablet Take 1 tablet by mouth daily.  3  . Calcium Carbonate-Vitamin D (OSCAL 500/200 D-3 PO) Take 1 tablet by mouth every evening.     . clopidogrel (PLAVIX) 75 MG tablet Take 1 tablet (75 mg total) by mouth at bedtime.    Marland Kitchen ezetimibe (ZETIA) 10 MG tablet Take 10 mg by mouth at bedtime.     . isosorbide mononitrate (IMDUR) 60 MG 24 hr tablet TAKE 1 TABLET BY MOUTH DAILY 90 tablet 3  . nitroGLYCERIN (NITROSTAT) 0.4 MG SL tablet Place 1 tablet (0.4 mg total) under the tongue every 5 (five) minutes as needed for chest pain (Call 911 at 3rd dose within 15 minutes). 25 tablet 3  .  RABEprazole (ACIPHEX) 20 MG tablet Take 20 mg by mouth at bedtime.   2  . sertraline (ZOLOFT) 100 MG tablet Take 100 mg by mouth daily.  3  . tiotropium (SPIRIVA HANDIHALER) 18 MCG inhalation capsule Place 18 mcg into inhaler and inhale daily.      No current facility-administered medications for this visit.     Allergies:   Ceclor [cefaclor]    Social History:  The patient  reports that she quit smoking about 23 years ago. Her smoking use included cigarettes. She has never used smokeless tobacco. She reports that she does not drink alcohol or use drugs.   Family History:  The patient's family history includes Arthritis in her sister; CVA in her mother; Diabetes in her sister; Heart attack in her sister; Hypertension in her sister; Liver cancer in her mother; Stroke in her maternal grandfather.    ROS:  General:no colds or fevers, no weight changes Skin:no rashes or ulcers HEENT:no blurred vision, no congestion CV:see HPI PUL:see HPI GI:no diarrhea constipation or melena, no indigestion GU:no hematuria, no dysuria MS:no joint pain, no claudication, Rt side weakness from CVA Neuro:no syncope, no lightheadedness Endo:pre- diabetes, no thyroid disease  Wt Readings from Last 3 Encounters:  11/08/17 204 lb (92.5 kg)  10/25/17 204 lb (92.5 kg)  09/17/17 203 lb 0.7 oz (92.1 kg)     PHYSICAL EXAM: VS:  BP 122/72   Pulse 70   Ht 5\' 4"  (1.626 m)   Wt 204 lb (92.5 kg)   SpO2 97%   BMI 35.02 kg/m  , BMI Body mass index is 35.02 kg/m. General:Pleasant affect, NAD Skin:Warm and dry, brisk capillary refill HEENT:normocephalic, sclera clear, mucus membranes moist Neck:supple, no JVD, no bruits  Heart:S1S2 RRR without murmur, gallup, rub or click Lungs:clear without rales, rhonchi, or wheezes WUJ:WJXB, non tender, + BS, do not palpate liver spleen or masses Ext:no lower ext edema, 2+ pedal pulses, 2+ radial pulses Neuro:alert and oriented, MAE, follows commands, + facial  symmetry    EKG:  EKG is NOT ordered today. From the hospital the EKG was SR without acute abnormalities.   Recent Labs: 09/17/2017: ALT 14 09/18/2017: TSH 1.815 09/20/2017: BUN 8; Creatinine, Ser 0.76; Hemoglobin 10.3; Platelets 157; Potassium 3.9; Sodium 142    Lipid Panel    Component Value Date/Time   CHOL 139 09/18/2017 0533   TRIG 150 (H) 09/18/2017 0533   HDL 34 (L) 09/18/2017 0533   CHOLHDL 4.1 09/18/2017 0533   VLDL 30 09/18/2017 0533   LDLCALC 75 09/18/2017 0533  Other studies Reviewed: Additional studies/ records that were reviewed today include: . Echo 09/18/17 Study Conclusions  - Left ventricle: The cavity size was normal. Wall thickness was   normal. Systolic function was normal. The estimated ejection   fraction was in the range of 50% to 55%. Wall motion was normal;   there were no regional wall motion abnormalities. Doppler   parameters are consistent with abnormal left ventricular   relaxation (grade 1 diastolic dysfunction). - Mitral valve: Calcified annulus. There was mild regurgitation. - Tricuspid valve: There was mild-moderate regurgitation.  Impressions:  - Normal LV systolic function; mild distolic dysfunction; mild MR;   negative saline microcavitation study.  The prior echo was from 2012 and mild TR.  ASSESSMENT AND PLAN:  1.  Hx of coronary spasm and no chest pain now.  No SOB.    2.  Pre-op exam is stable from cardiac perspective to proceed with surgery.   3.  Recent TIA and is on plavix through neuro.  4.  HLD controlled continue atorvastatin.     5.  Hx CVA with rt side weakness.    Follow up with Dr. Tamala Julian in 6 months unless problems occur then call.   Current medicines are reviewed with the patient today.  The patient Has no concerns regarding medicines.  The following changes have been made:  See above Labs/ tests ordered today include:see above  Disposition:   FU:  see above  Signed, Cecilie Kicks, NP   11/10/2017 4:24 PM    [address], [place], [place], [place] Phone: [phone]; Fax: [phone]

## 2017-11-08 ENCOUNTER — Ambulatory Visit (INDEPENDENT_AMBULATORY_CARE_PROVIDER_SITE_OTHER): Payer: Medicare Other | Admitting: Cardiology

## 2017-11-08 ENCOUNTER — Encounter: Payer: Self-pay | Admitting: Cardiology

## 2017-11-08 VITALS — BP 122/72 | HR 70 | Ht 64.0 in | Wt 204.0 lb

## 2017-11-08 DIAGNOSIS — I69351 Hemiplegia and hemiparesis following cerebral infarction affecting right dominant side: Secondary | ICD-10-CM

## 2017-11-08 DIAGNOSIS — I201 Angina pectoris with documented spasm: Secondary | ICD-10-CM | POA: Diagnosis not present

## 2017-11-08 DIAGNOSIS — E7849 Other hyperlipidemia: Secondary | ICD-10-CM | POA: Diagnosis not present

## 2017-11-08 DIAGNOSIS — Z01818 Encounter for other preprocedural examination: Secondary | ICD-10-CM

## 2017-11-08 MED ORDER — NITROGLYCERIN 0.4 MG SL SUBL: 0.4000 mg | SUBLINGUAL_TABLET | SUBLINGUAL | 3 refills | Status: AC | PRN

## 2017-11-08 NOTE — Patient Instructions (Signed)
Medication Instructions:  1. A REFILL FOR NTG HAS BEEN SENT IN  Labwork: NONE ORDERED TODAY  Testing/Procedures: NONE ORDERED TODAY  Follow-Up: 6 MONTHS WITH DR. Tamala Julian   Any Other Special Instructions Will Be Listed Below (If Applicable).     If you need a refill on your cardiac medications before your next appointment, please call your pharmacy.

## 2017-11-10 ENCOUNTER — Encounter: Payer: Self-pay | Admitting: Cardiology

## 2017-11-20 DIAGNOSIS — H35359 Cystoid macular degeneration, unspecified eye: Secondary | ICD-10-CM | POA: Diagnosis not present

## 2017-11-21 DIAGNOSIS — J432 Centrilobular emphysema: Secondary | ICD-10-CM | POA: Diagnosis not present

## 2017-11-21 DIAGNOSIS — E119 Type 2 diabetes mellitus without complications: Secondary | ICD-10-CM | POA: Diagnosis not present

## 2017-11-21 DIAGNOSIS — I69351 Hemiplegia and hemiparesis following cerebral infarction affecting right dominant side: Secondary | ICD-10-CM | POA: Diagnosis not present

## 2017-11-21 DIAGNOSIS — I2510 Atherosclerotic heart disease of native coronary artery without angina pectoris: Secondary | ICD-10-CM | POA: Diagnosis not present

## 2017-11-21 DIAGNOSIS — I69320 Aphasia following cerebral infarction: Secondary | ICD-10-CM | POA: Diagnosis not present

## 2017-11-21 DIAGNOSIS — M810 Age-related osteoporosis without current pathological fracture: Secondary | ICD-10-CM | POA: Diagnosis not present

## 2017-11-22 ENCOUNTER — Ambulatory Visit: Payer: Self-pay | Admitting: Surgery

## 2017-11-22 DIAGNOSIS — K819 Cholecystitis, unspecified: Secondary | ICD-10-CM | POA: Diagnosis not present

## 2017-11-22 DIAGNOSIS — K37 Unspecified appendicitis: Secondary | ICD-10-CM | POA: Diagnosis not present

## 2017-11-22 NOTE — H&P (Signed)
Brandy Paul Location: [place] Office Patient #: [id number] DOB: 1945-09-07 Married / Language: English / Race: White Female   History of Present Illness Brandy Paul [name] MD The patient is a 72 year old female who presents for evaluation of gall stones. She comes in today with her husband and I discussed her our cholangiogram which showed that her tube become dislodged. She does have multiple gallstones and there was partial opacification of the gallbladder.  I discussed laparoscopic cholecystectomy and appendectomy with her and ask her if she wanted to have this Paul. She indicated that she did. She had a stroke 5 years ago. She is on Plavix. She has episodes of ARRHYTHMIA for which her husband treats her with medication and she seems to do okay. I discussed the procedure and risk to some degree and she is aware of this.   When her surgery was scheduled for July, she had a TIA and this was cancelled.  She has since been given clearance.  She takes aspirin and plavix which she will stop the week before surgery.    Allergies ) Ceclor *CEPHALOSPORINS*   Medication History  Aspirin (81MG  Tablet, Oral) Active. Atorvastatin Calcium (20MG  Tablet, Oral) Active. BuPROPion HCl ER (SR) (150MG  Tablet ER 12HR, Oral) Active. Clopidogrel Bisulfate (75MG  Tablet, Oral) Active. Zetia (10MG  Tablet, Oral) Active. Isosorbide Mononitrate ER (60MG  Tablet ER 24HR, Oral) Active. Nitrostat (0.4MG  Tab Sublingual, Sublingual) Active. RABEprazole Sodium (20MG  Tablet DR, Oral) Active. Spiriva HandiHaler (18MCG Capsule, Inhalation) Active. Sertraline HCl (100MG  Tablet, Oral) Active. 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 the tube is out. She wants to have her gallbladder removed. We'll schedule her for laparoscopic cholecystectomy and lap appendectomy at the same time. APPENDICITIS, UNSPECIFIED APPENDICITIS TYPE (K37) Impression: treated medically in the past by Dr. Maxcine Ham [name], MD, FACS

## 2017-11-22 NOTE — H&P (View-Only) (Signed)
Brandy Paul Location: [place] Office Patient #: [id number] DOB: 28-Jul-1945 Married / Language: English / Race: White Female   History of Present Illness [place] [name] MD The patient is a 72 year old female who presents for evaluation of gall stones. She comes in today with her husband and I discussed her our cholangiogram which showed that her tube become dislodged. She does have multiple gallstones and there was partial opacification of the gallbladder.  I discussed laparoscopic cholecystectomy and appendectomy with her and ask her if she wanted to have this done. She indicated that she did. She had a stroke 5 years ago. She is on Plavix. She has episodes of ARRHYTHMIA for which her husband treats her with medication and she seems to do okay. I discussed the procedure and risk to some degree and she is aware of this.   When her surgery was scheduled for July, she had a TIA and this was cancelled.  She has since been given clearance.  She takes aspirin and plavix which she will stop the week before surgery.    Allergies ) Ceclor *CEPHALOSPORINS*   Medication History  Aspirin (81MG  Tablet, Oral) Active. Atorvastatin Calcium (20MG  Tablet, Oral) Active. BuPROPion HCl ER (SR) (150MG  Tablet ER 12HR, Oral) Active. Clopidogrel Bisulfate (75MG  Tablet, Oral) Active. Zetia (10MG  Tablet, Oral) Active. Isosorbide Mononitrate ER (60MG  Tablet ER 24HR, Oral) Active. Nitrostat (0.4MG  Tab Sublingual, Sublingual) Active. RABEprazole Sodium (20MG  Tablet DR, Oral) Active. Spiriva HandiHaler (18MCG Capsule, Inhalation) Active. Sertraline HCl (100MG  Tablet, Oral) Active. 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 the tube is out. She wants to have her gallbladder removed. We'll schedule her for laparoscopic cholecystectomy and lap appendectomy at the same time. APPENDICITIS, UNSPECIFIED APPENDICITIS TYPE (K37) Impression: treated medically in the past by Dr. Maxcine Ham [name], MD, FACS

## 2017-11-23 DIAGNOSIS — D509 Iron deficiency anemia, unspecified: Secondary | ICD-10-CM | POA: Diagnosis not present

## 2017-11-23 DIAGNOSIS — I69351 Hemiplegia and hemiparesis following cerebral infarction affecting right dominant side: Secondary | ICD-10-CM | POA: Diagnosis not present

## 2017-11-23 DIAGNOSIS — I2510 Atherosclerotic heart disease of native coronary artery without angina pectoris: Secondary | ICD-10-CM | POA: Diagnosis not present

## 2017-11-23 DIAGNOSIS — M810 Age-related osteoporosis without current pathological fracture: Secondary | ICD-10-CM | POA: Diagnosis not present

## 2017-11-23 DIAGNOSIS — K3580 Unspecified acute appendicitis: Secondary | ICD-10-CM | POA: Diagnosis not present

## 2017-11-23 DIAGNOSIS — D508 Other iron deficiency anemias: Secondary | ICD-10-CM | POA: Diagnosis not present

## 2017-11-23 DIAGNOSIS — E119 Type 2 diabetes mellitus without complications: Secondary | ICD-10-CM | POA: Diagnosis not present

## 2017-11-23 DIAGNOSIS — F329 Major depressive disorder, single episode, unspecified: Secondary | ICD-10-CM | POA: Diagnosis not present

## 2017-11-23 DIAGNOSIS — I10 Essential (primary) hypertension: Secondary | ICD-10-CM | POA: Diagnosis not present

## 2017-11-23 DIAGNOSIS — Z7902 Long term (current) use of antithrombotics/antiplatelets: Secondary | ICD-10-CM | POA: Diagnosis not present

## 2017-11-23 DIAGNOSIS — N393 Stress incontinence (female) (male): Secondary | ICD-10-CM | POA: Diagnosis not present

## 2017-11-23 DIAGNOSIS — Z853 Personal history of malignant neoplasm of breast: Secondary | ICD-10-CM | POA: Diagnosis not present

## 2017-11-23 DIAGNOSIS — Z7982 Long term (current) use of aspirin: Secondary | ICD-10-CM | POA: Diagnosis not present

## 2017-11-23 DIAGNOSIS — I69320 Aphasia following cerebral infarction: Secondary | ICD-10-CM | POA: Diagnosis not present

## 2017-11-23 DIAGNOSIS — F3289 Other specified depressive episodes: Secondary | ICD-10-CM | POA: Diagnosis not present

## 2017-11-23 DIAGNOSIS — Z9181 History of falling: Secondary | ICD-10-CM | POA: Diagnosis not present

## 2017-11-23 DIAGNOSIS — K8000 Calculus of gallbladder with acute cholecystitis without obstruction: Secondary | ICD-10-CM | POA: Diagnosis not present

## 2017-11-23 DIAGNOSIS — J432 Centrilobular emphysema: Secondary | ICD-10-CM | POA: Diagnosis not present

## 2017-11-23 DIAGNOSIS — Z87891 Personal history of nicotine dependence: Secondary | ICD-10-CM | POA: Diagnosis not present

## 2017-11-28 DIAGNOSIS — M810 Age-related osteoporosis without current pathological fracture: Secondary | ICD-10-CM | POA: Diagnosis not present

## 2017-11-28 DIAGNOSIS — J432 Centrilobular emphysema: Secondary | ICD-10-CM | POA: Diagnosis not present

## 2017-11-28 DIAGNOSIS — E119 Type 2 diabetes mellitus without complications: Secondary | ICD-10-CM | POA: Diagnosis not present

## 2017-11-28 DIAGNOSIS — I2510 Atherosclerotic heart disease of native coronary artery without angina pectoris: Secondary | ICD-10-CM | POA: Diagnosis not present

## 2017-11-28 DIAGNOSIS — I69320 Aphasia following cerebral infarction: Secondary | ICD-10-CM | POA: Diagnosis not present

## 2017-11-28 DIAGNOSIS — I69351 Hemiplegia and hemiparesis following cerebral infarction affecting right dominant side: Secondary | ICD-10-CM | POA: Diagnosis not present

## 2017-11-30 DIAGNOSIS — I69351 Hemiplegia and hemiparesis following cerebral infarction affecting right dominant side: Secondary | ICD-10-CM | POA: Diagnosis not present

## 2017-11-30 DIAGNOSIS — I2510 Atherosclerotic heart disease of native coronary artery without angina pectoris: Secondary | ICD-10-CM | POA: Diagnosis not present

## 2017-11-30 DIAGNOSIS — J432 Centrilobular emphysema: Secondary | ICD-10-CM | POA: Diagnosis not present

## 2017-11-30 DIAGNOSIS — M810 Age-related osteoporosis without current pathological fracture: Secondary | ICD-10-CM | POA: Diagnosis not present

## 2017-11-30 DIAGNOSIS — E119 Type 2 diabetes mellitus without complications: Secondary | ICD-10-CM | POA: Diagnosis not present

## 2017-11-30 DIAGNOSIS — I69320 Aphasia following cerebral infarction: Secondary | ICD-10-CM | POA: Diagnosis not present

## 2017-12-03 DIAGNOSIS — J432 Centrilobular emphysema: Secondary | ICD-10-CM | POA: Diagnosis not present

## 2017-12-03 DIAGNOSIS — I69351 Hemiplegia and hemiparesis following cerebral infarction affecting right dominant side: Secondary | ICD-10-CM | POA: Diagnosis not present

## 2017-12-03 DIAGNOSIS — M810 Age-related osteoporosis without current pathological fracture: Secondary | ICD-10-CM | POA: Diagnosis not present

## 2017-12-03 DIAGNOSIS — E119 Type 2 diabetes mellitus without complications: Secondary | ICD-10-CM | POA: Diagnosis not present

## 2017-12-03 DIAGNOSIS — I2510 Atherosclerotic heart disease of native coronary artery without angina pectoris: Secondary | ICD-10-CM | POA: Diagnosis not present

## 2017-12-03 DIAGNOSIS — I69320 Aphasia following cerebral infarction: Secondary | ICD-10-CM | POA: Diagnosis not present

## 2017-12-05 NOTE — Progress Notes (Signed)
11-08-17 (Epic) Cardiac clearance from Cecilie Kicks, NP  09-17-17 (Epic) EKG, ECHO, CXR

## 2017-12-06 ENCOUNTER — Encounter (HOSPITAL_COMMUNITY): Payer: Self-pay

## 2017-12-06 ENCOUNTER — Other Ambulatory Visit: Payer: Self-pay

## 2017-12-06 ENCOUNTER — Encounter (HOSPITAL_COMMUNITY)
Admission: RE | Admit: 2017-12-06 | Discharge: 2017-12-06 | Disposition: A | Discharge status: Home / Self Care | Payer: Medicare Other | Source: Ambulatory Visit | Attending: Surgery | Admitting: Surgery

## 2017-12-06 DIAGNOSIS — K8080 Other cholelithiasis without obstruction: Secondary | ICD-10-CM | POA: Insufficient documentation

## 2017-12-06 DIAGNOSIS — I69320 Aphasia following cerebral infarction: Secondary | ICD-10-CM | POA: Diagnosis not present

## 2017-12-06 DIAGNOSIS — M810 Age-related osteoporosis without current pathological fracture: Secondary | ICD-10-CM | POA: Diagnosis not present

## 2017-12-06 DIAGNOSIS — J449 Chronic obstructive pulmonary disease, unspecified: Secondary | ICD-10-CM | POA: Diagnosis not present

## 2017-12-06 DIAGNOSIS — Z9889 Other specified postprocedural states: Secondary | ICD-10-CM | POA: Insufficient documentation

## 2017-12-06 DIAGNOSIS — Z79899 Other long term (current) drug therapy: Secondary | ICD-10-CM | POA: Insufficient documentation

## 2017-12-06 DIAGNOSIS — Z90710 Acquired absence of both cervix and uterus: Secondary | ICD-10-CM | POA: Diagnosis not present

## 2017-12-06 DIAGNOSIS — Z01812 Encounter for preprocedural laboratory examination: Secondary | ICD-10-CM | POA: Insufficient documentation

## 2017-12-06 DIAGNOSIS — Z8673 Personal history of transient ischemic attack (TIA), and cerebral infarction without residual deficits: Secondary | ICD-10-CM | POA: Diagnosis not present

## 2017-12-06 DIAGNOSIS — E785 Hyperlipidemia, unspecified: Secondary | ICD-10-CM | POA: Insufficient documentation

## 2017-12-06 DIAGNOSIS — E119 Type 2 diabetes mellitus without complications: Secondary | ICD-10-CM | POA: Diagnosis not present

## 2017-12-06 DIAGNOSIS — K36 Other appendicitis: Secondary | ICD-10-CM | POA: Diagnosis not present

## 2017-12-06 DIAGNOSIS — I69351 Hemiplegia and hemiparesis following cerebral infarction affecting right dominant side: Secondary | ICD-10-CM | POA: Diagnosis not present

## 2017-12-06 DIAGNOSIS — Z7982 Long term (current) use of aspirin: Secondary | ICD-10-CM | POA: Diagnosis not present

## 2017-12-06 DIAGNOSIS — J432 Centrilobular emphysema: Secondary | ICD-10-CM | POA: Diagnosis not present

## 2017-12-06 DIAGNOSIS — D509 Iron deficiency anemia, unspecified: Secondary | ICD-10-CM | POA: Insufficient documentation

## 2017-12-06 DIAGNOSIS — I2510 Atherosclerotic heart disease of native coronary artery without angina pectoris: Secondary | ICD-10-CM | POA: Diagnosis not present

## 2017-12-06 DIAGNOSIS — Z853 Personal history of malignant neoplasm of breast: Secondary | ICD-10-CM | POA: Diagnosis not present

## 2017-12-06 LAB — HEMOGLOBIN A1C
HEMOGLOBIN A1C: 6.1 % — AB (ref 4.8–5.6)
Mean Plasma Glucose: 128.37 mg/dL

## 2017-12-06 LAB — BASIC METABOLIC PANEL
ANION GAP: 8 (ref 5–15)
BUN: 19 mg/dL (ref 8–23)
CO2: 27 mmol/L (ref 22–32)
Calcium: 8.8 mg/dL — ABNORMAL LOW (ref 8.9–10.3)
Chloride: 106 mmol/L (ref 98–111)
Creatinine, Ser: 0.98 mg/dL (ref 0.44–1.00)
GFR, EST NON AFRICAN AMERICAN: 56 mL/min — AB (ref >60)
Glucose, Bld: 114 mg/dL — ABNORMAL HIGH (ref 70–99)
POTASSIUM: 4.2 mmol/L (ref 3.5–5.1)
Sodium: 141 mmol/L (ref 135–145)

## 2017-12-06 LAB — CBC
HEMATOCRIT: 33.7 % — AB (ref 36.0–46.0)
HEMOGLOBIN: 9.8 g/dL — AB (ref 12.0–15.0)
MCH: 23.4 pg — ABNORMAL LOW (ref 26.0–34.0)
MCHC: 29.1 g/dL — ABNORMAL LOW (ref 30.0–36.0)
MCV: 80.6 fL (ref 78.0–100.0)
Platelets: 260 10*3/uL (ref 150–400)
RBC: 4.18 MIL/uL (ref 3.87–5.11)
RDW: 17.1 % — ABNORMAL HIGH (ref 11.5–15.5)
WBC: 6 10*3/uL (ref 4.0–10.5)

## 2017-12-06 NOTE — Progress Notes (Signed)
12-06-17 Anesthesiologist consult noted in order. Per Dr. Annye Asa, no consult necessary. Pt will be assessed on day of surgery.

## 2017-12-06 NOTE — Progress Notes (Signed)
12-06-17 CBC results routed to Dr. Hassell Done for review.

## 2017-12-06 NOTE — Patient Instructions (Addendum)
JESUSITA JOCELYN  12/06/2017   Your procedure is scheduled on: 12-10-17     Report to [facility] Main  Entrance    Report to Admitting at 5:30 AM    Call this number if you have problems the morning of surgery [phone]    Remember: NO SOLID FOOD AFTER MIDNIGHT THE NIGHT PRIOR TO SURGERY. NOTHING BY MOUTH EXCEPT CLEAR LIQUIDS UNTIL 3 HOURS PRIOR TO [place] SURGERY. PLEASE FINISH ENSURE DRINK PER SURGEON ORDER 3 HOURS PRIOR TO SCHEDULED SURGERY TIME WHICH NEEDS TO BE COMPLETED AT 4:30 AM.     CLEAR LIQUID DIET   Foods Allowed                                                                     Foods Excluded  Coffee and tea, regular and decaf                             liquids that you cannot  Plain Jell-O in any flavor                                             see through such as: Fruit ices (not with fruit pulp)                                     milk, soups, orange juice  Iced Popsicles                                    All solid food Carbonated beverages, regular and diet                                    Cranberry, grape and apple juices Sports drinks like Gatorade Lightly seasoned clear broth or consume(fat free) Sugar, honey syrup  Sample Menu Breakfast                                Lunch                                     Supper Cranberry juice                    Beef broth                            Chicken broth Jell-O                                     Grape juice  Apple juice Coffee or tea                        Jell-O                                      Popsicle                                                Coffee or tea                        Coffee or tea  _____________________________________________________________________    BRUSH YOUR TEETH MORNING OF SURGERY AND RINSE YOUR MOUTH OUT, NO CHEWING GUM CANDY OR MINTS.     Take these medicines the morning of surgery with A SIP OF WATER: None                                  You may not have any metal on your body including hair pins and              piercings  Do not wear jewelry, make-up, lotions, powders or perfumes, deodorant             Do not wear nail polish.  Do not shave  48 hours prior to surgery.               Do not bring valuables to the hospital. [place].  Contacts, dentures or bridgework may not be worn into surgery.  Leave suitcase in the car. After surgery it may be brought to your room.    Special Instructions: N/A              Please read over the following fact sheets you were given: _____________________________________________________________________  [facility] - Preparing for Surgery Before surgery, you can play an important role.  Because skin is not sterile, your skin needs to be as free of germs as possible.  You can reduce the number of germs on your skin by washing with CHG (chlorahexidine gluconate) soap before surgery.  CHG is an antiseptic cleaner which kills germs and bonds with the skin to continue killing germs even after washing. Please DO NOT use if you have an allergy to CHG or antibacterial soaps.  If your skin becomes reddened/irritated stop using the CHG and inform your nurse when you arrive at Short Stay. Do not shave (including legs and underarms) for at least 48 hours prior to the first CHG shower.  You may shave your face/neck. Please follow these instructions carefully:  1.  Shower with CHG Soap the night before surgery and the  morning of Surgery.  2.  If you choose to wash your hair, wash your hair first as usual with your  normal  shampoo.  3.  After you shampoo, rinse your hair and body thoroughly to remove the  shampoo.                           4.  Use CHG  as you would any other liquid soap.  You can apply chg directly  to the skin and wash                       Gently with a scrungie or clean washcloth.  5.  Apply the CHG Soap to  your body ONLY FROM THE NECK DOWN.   Do not use on face/ open                           Wound or open sores. Avoid contact with eyes, ears mouth and genitals (private parts).                       Wash face,  Genitals (private parts) with your normal soap.             6.  Wash thoroughly, paying special attention to the area where your surgery  will be performed.  7.  Thoroughly rinse your body with warm water from the neck down.  8.  DO NOT shower/wash with your normal soap after using and rinsing off  the CHG Soap.                9.  Pat yourself dry with a clean towel.            10.  Wear clean pajamas.            11.  Place clean sheets on your bed the night of your first shower and do not  sleep with pets. Day of Surgery : Do not apply any lotions/deodorants the morning of surgery.  Please wear clean clothes to the hospital/surgery center.  FAILURE TO FOLLOW THESE INSTRUCTIONS MAY RESULT IN THE CANCELLATION OF YOUR SURGERY PATIENT SIGNATURE_________________________________  NURSE SIGNATURE__________________________________  ________________________________________________________________________

## 2017-12-09 MED ORDER — BUPIVACAINE LIPOSOME 1.3 % IJ SUSP
20.0000 mL | INTRAMUSCULAR | Status: DC
  Filled 2017-12-09: qty 20

## 2017-12-10 ENCOUNTER — Encounter (HOSPITAL_COMMUNITY)
Admission: RE | Disposition: A | Discharge status: Home / Self Care | Payer: Self-pay | Source: Ambulatory Visit | Attending: Surgery

## 2017-12-10 ENCOUNTER — Ambulatory Visit (HOSPITAL_COMMUNITY): Payer: Medicare Other

## 2017-12-10 ENCOUNTER — Encounter (HOSPITAL_COMMUNITY): Payer: Self-pay | Admitting: Emergency Medicine

## 2017-12-10 ENCOUNTER — Ambulatory Visit (HOSPITAL_COMMUNITY): Payer: Medicare Other | Admitting: Anesthesiology

## 2017-12-10 ENCOUNTER — Observation Stay (HOSPITAL_COMMUNITY)
Admission: RE | Admit: 2017-12-10 | Discharge: 2017-12-11 | Disposition: A | Discharge status: Home / Self Care | Payer: Medicare Other | Source: Ambulatory Visit | Attending: Surgery | Admitting: Surgery

## 2017-12-10 ENCOUNTER — Other Ambulatory Visit: Payer: Self-pay

## 2017-12-10 DIAGNOSIS — K3580 Unspecified acute appendicitis: Secondary | ICD-10-CM | POA: Diagnosis not present

## 2017-12-10 DIAGNOSIS — K8020 Calculus of gallbladder without cholecystitis without obstruction: Secondary | ICD-10-CM

## 2017-12-10 DIAGNOSIS — K8080 Other cholelithiasis without obstruction: Secondary | ICD-10-CM | POA: Diagnosis present

## 2017-12-10 DIAGNOSIS — Z9049 Acquired absence of other specified parts of digestive tract: Secondary | ICD-10-CM

## 2017-12-10 DIAGNOSIS — Z87891 Personal history of nicotine dependence: Secondary | ICD-10-CM | POA: Insufficient documentation

## 2017-12-10 DIAGNOSIS — K8012 Calculus of gallbladder with acute and chronic cholecystitis without obstruction: Principal | ICD-10-CM | POA: Insufficient documentation

## 2017-12-10 DIAGNOSIS — E785 Hyperlipidemia, unspecified: Secondary | ICD-10-CM | POA: Diagnosis not present

## 2017-12-10 DIAGNOSIS — I69351 Hemiplegia and hemiparesis following cerebral infarction affecting right dominant side: Secondary | ICD-10-CM | POA: Diagnosis not present

## 2017-12-10 DIAGNOSIS — K819 Cholecystitis, unspecified: Secondary | ICD-10-CM | POA: Diagnosis not present

## 2017-12-10 DIAGNOSIS — Z79899 Other long term (current) drug therapy: Secondary | ICD-10-CM | POA: Diagnosis not present

## 2017-12-10 DIAGNOSIS — E1151 Type 2 diabetes mellitus with diabetic peripheral angiopathy without gangrene: Secondary | ICD-10-CM | POA: Diagnosis not present

## 2017-12-10 DIAGNOSIS — Z7982 Long term (current) use of aspirin: Secondary | ICD-10-CM | POA: Diagnosis not present

## 2017-12-10 DIAGNOSIS — K8066 Calculus of gallbladder and bile duct with acute and chronic cholecystitis without obstruction: Secondary | ICD-10-CM | POA: Diagnosis not present

## 2017-12-10 DIAGNOSIS — I10 Essential (primary) hypertension: Secondary | ICD-10-CM | POA: Insufficient documentation

## 2017-12-10 DIAGNOSIS — I739 Peripheral vascular disease, unspecified: Secondary | ICD-10-CM | POA: Diagnosis not present

## 2017-12-10 HISTORY — PX: LAPAROSCOPIC APPENDECTOMY: SHX408

## 2017-12-10 HISTORY — PX: CHOLECYSTECTOMY: SHX55

## 2017-12-10 LAB — CREATININE, SERUM
Creatinine, Ser: 1.09 mg/dL — ABNORMAL HIGH (ref 0.44–1.00)
GFR calc Af Amer: 57 mL/min — ABNORMAL LOW (ref >60)
GFR calc non Af Amer: 49 mL/min — ABNORMAL LOW (ref >60)

## 2017-12-10 LAB — CBC
HEMATOCRIT: 32.4 % — AB (ref 36.0–46.0)
Hemoglobin: 9.5 g/dL — ABNORMAL LOW (ref 12.0–15.0)
MCH: 23.1 pg — AB (ref 26.0–34.0)
MCHC: 29.3 g/dL — ABNORMAL LOW (ref 30.0–36.0)
MCV: 78.6 fL (ref 78.0–100.0)
Platelets: 234 10*3/uL (ref 150–400)
RBC: 4.12 MIL/uL (ref 3.87–5.11)
RDW: 16.6 % — ABNORMAL HIGH (ref 11.5–15.5)
WBC: 10.1 10*3/uL (ref 4.0–10.5)

## 2017-12-10 SURGERY — LAPAROSCOPIC CHOLECYSTECTOMY WITH INTRAOPERATIVE CHOLANGIOGRAM
Anesthesia: General | Site: Abdomen

## 2017-12-10 MED ORDER — PROMETHAZINE HCL 25 MG/ML IJ SOLN: 6.2500 mg | INTRAMUSCULAR | Status: DC | PRN

## 2017-12-10 MED ORDER — ROCURONIUM BROMIDE 10 MG/ML (PF) SYRINGE
PREFILLED_SYRINGE | INTRAVENOUS | Status: AC
  Filled 2017-12-10: qty 10

## 2017-12-10 MED ORDER — METOPROLOL TARTRATE 5 MG/5ML IV SOLN: 5.0000 mg | Freq: Four times a day (QID) | INTRAVENOUS | Status: DC | PRN

## 2017-12-10 MED ORDER — KCL IN DEXTROSE-NACL 20-5-0.45 MEQ/L-%-% IV SOLN
INTRAVENOUS | Status: DC
  Administered 2017-12-10 (×2): via INTRAVENOUS
  Filled 2017-12-10 (×3): qty 1000

## 2017-12-10 MED ORDER — BUPIVACAINE LIPOSOME 1.3 % IJ SUSP
INTRAMUSCULAR | Status: DC | PRN
  Administered 2017-12-10: 20 mL

## 2017-12-10 MED ORDER — NITROGLYCERIN 0.4 MG SL SUBL: 0.4000 mg | SUBLINGUAL_TABLET | SUBLINGUAL | Status: DC | PRN

## 2017-12-10 MED ORDER — PROPOFOL 10 MG/ML IV BOLUS
INTRAVENOUS | Status: AC
  Filled 2017-12-10: qty 20

## 2017-12-10 MED ORDER — ONDANSETRON HCL 4 MG/2ML IJ SOLN: 4.0000 mg | Freq: Four times a day (QID) | INTRAMUSCULAR | Status: DC | PRN

## 2017-12-10 MED ORDER — ROCURONIUM BROMIDE 100 MG/10ML IV SOLN
INTRAVENOUS | Status: DC | PRN
  Administered 2017-12-10: 10 mg via INTRAVENOUS
  Administered 2017-12-10: 50 mg via INTRAVENOUS

## 2017-12-10 MED ORDER — ACETAMINOPHEN 500 MG PO TABS
1000.0000 mg | ORAL_TABLET | ORAL | Status: AC
  Administered 2017-12-10: 1000 mg via ORAL
  Filled 2017-12-10: qty 2

## 2017-12-10 MED ORDER — IOPAMIDOL (ISOVUE-300) INJECTION 61%
INTRAVENOUS | Status: DC | PRN
  Administered 2017-12-10: 6 mL

## 2017-12-10 MED ORDER — ISOSORBIDE MONONITRATE ER 60 MG PO TB24
60.0000 mg | ORAL_TABLET | Freq: Every evening | ORAL | Status: DC
  Administered 2017-12-10: 60 mg via ORAL
  Filled 2017-12-10: qty 1

## 2017-12-10 MED ORDER — TIOTROPIUM BROMIDE MONOHYDRATE 18 MCG IN CAPS
18.0000 ug | ORAL_CAPSULE | Freq: Every evening | RESPIRATORY_TRACT | Status: DC
  Administered 2017-12-10: 18 ug via RESPIRATORY_TRACT
  Filled 2017-12-10: qty 5

## 2017-12-10 MED ORDER — ASPIRIN EC 81 MG PO TBEC
81.0000 mg | DELAYED_RELEASE_TABLET | Freq: Every day | ORAL | Status: DC
  Administered 2017-12-11: 81 mg via ORAL
  Filled 2017-12-10: qty 1

## 2017-12-10 MED ORDER — LIDOCAINE 2% (20 MG/ML) 5 ML SYRINGE
INTRAMUSCULAR | Status: AC
  Filled 2017-12-10: qty 5

## 2017-12-10 MED ORDER — LIDOCAINE 2% (20 MG/ML) 5 ML SYRINGE
INTRAMUSCULAR | Status: DC | PRN
  Administered 2017-12-10: 1.5 mg/kg/h via INTRAVENOUS

## 2017-12-10 MED ORDER — HYDROCODONE-ACETAMINOPHEN 5-325 MG PO TABS
1.0000 | ORAL_TABLET | ORAL | Status: DC | PRN
  Administered 2017-12-10 – 2017-12-11 (×4): 2 via ORAL
  Filled 2017-12-10 (×4): qty 2

## 2017-12-10 MED ORDER — SODIUM CHLORIDE 0.9 % IV SOLN
2.0000 g | Freq: Two times a day (BID) | INTRAVENOUS | Status: AC
  Administered 2017-12-10: 2 g via INTRAVENOUS
  Filled 2017-12-10: qty 2

## 2017-12-10 MED ORDER — IOPAMIDOL (ISOVUE-300) INJECTION 61%
INTRAVENOUS | Status: AC
  Filled 2017-12-10: qty 50

## 2017-12-10 MED ORDER — ONDANSETRON HCL 4 MG/2ML IJ SOLN
INTRAMUSCULAR | Status: AC
  Filled 2017-12-10: qty 2

## 2017-12-10 MED ORDER — KETOROLAC TROMETHAMINE 30 MG/ML IJ SOLN: 15.0000 mg | Freq: Once | INTRAMUSCULAR | Status: DC | PRN

## 2017-12-10 MED ORDER — MIDAZOLAM HCL 2 MG/2ML IJ SOLN
INTRAMUSCULAR | Status: AC
  Filled 2017-12-10: qty 2

## 2017-12-10 MED ORDER — SUGAMMADEX SODIUM 500 MG/5ML IV SOLN
INTRAVENOUS | Status: DC | PRN
  Administered 2017-12-10: 360 mg via INTRAVENOUS

## 2017-12-10 MED ORDER — FENTANYL CITRATE (PF) 100 MCG/2ML IJ SOLN: 25.0000 ug | INTRAMUSCULAR | Status: DC | PRN

## 2017-12-10 MED ORDER — FENTANYL CITRATE (PF) 100 MCG/2ML IJ SOLN
INTRAMUSCULAR | Status: DC | PRN
  Administered 2017-12-10: 25 ug via INTRAVENOUS
  Administered 2017-12-10: 50 ug via INTRAVENOUS
  Administered 2017-12-10 (×3): 25 ug via INTRAVENOUS

## 2017-12-10 MED ORDER — PROPOFOL 10 MG/ML IV BOLUS
INTRAVENOUS | Status: DC | PRN
  Administered 2017-12-10: 80 mg via INTRAVENOUS

## 2017-12-10 MED ORDER — CHLORHEXIDINE GLUCONATE CLOTH 2 % EX PADS: 6.0000 | MEDICATED_PAD | Freq: Once | CUTANEOUS | Status: DC

## 2017-12-10 MED ORDER — 0.9 % SODIUM CHLORIDE (POUR BTL) OPTIME
TOPICAL | Status: DC | PRN
  Administered 2017-12-10: 1000 mL

## 2017-12-10 MED ORDER — LACTATED RINGERS IR SOLN
Status: DC | PRN
  Administered 2017-12-10: 1000 mL

## 2017-12-10 MED ORDER — EPHEDRINE SULFATE 50 MG/ML IJ SOLN
INTRAMUSCULAR | Status: DC | PRN
  Administered 2017-12-10 (×2): 7 mg via INTRAVENOUS

## 2017-12-10 MED ORDER — ONDANSETRON HCL 4 MG/2ML IJ SOLN
INTRAMUSCULAR | Status: DC | PRN
  Administered 2017-12-10: 4 mg via INTRAVENOUS

## 2017-12-10 MED ORDER — ONDANSETRON 4 MG PO TBDP: 4.0000 mg | ORAL_TABLET | Freq: Four times a day (QID) | ORAL | Status: DC | PRN

## 2017-12-10 MED ORDER — HYDRALAZINE HCL 20 MG/ML IJ SOLN: 10.0000 mg | INTRAMUSCULAR | Status: DC | PRN

## 2017-12-10 MED ORDER — HEPARIN SODIUM (PORCINE) 5000 UNIT/ML IJ SOLN
5000.0000 [IU] | Freq: Once | INTRAMUSCULAR | Status: AC
  Administered 2017-12-10: 5000 [IU] via SUBCUTANEOUS
  Filled 2017-12-10: qty 1

## 2017-12-10 MED ORDER — FENTANYL CITRATE (PF) 100 MCG/2ML IJ SOLN
INTRAMUSCULAR | Status: AC
  Filled 2017-12-10: qty 2

## 2017-12-10 MED ORDER — MORPHINE SULFATE (PF) 2 MG/ML IV SOLN: 1.0000 mg | INTRAVENOUS | Status: DC | PRN

## 2017-12-10 MED ORDER — LACTATED RINGERS IV SOLN
INTRAVENOUS | Status: DC | PRN
  Administered 2017-12-10: 07:00:00 via INTRAVENOUS

## 2017-12-10 MED ORDER — PANTOPRAZOLE SODIUM 40 MG PO TBEC
40.0000 mg | DELAYED_RELEASE_TABLET | Freq: Every day | ORAL | Status: DC
  Administered 2017-12-10 – 2017-12-11 (×2): 40 mg via ORAL
  Filled 2017-12-10 (×2): qty 1

## 2017-12-10 MED ORDER — SODIUM CHLORIDE 0.9 % IJ SOLN
INTRAMUSCULAR | Status: AC
  Filled 2017-12-10: qty 10

## 2017-12-10 MED ORDER — HEPARIN SODIUM (PORCINE) 5000 UNIT/ML IJ SOLN
5000.0000 [IU] | Freq: Three times a day (TID) | INTRAMUSCULAR | Status: DC
  Administered 2017-12-10 – 2017-12-11 (×2): 5000 [IU] via SUBCUTANEOUS
  Filled 2017-12-10 (×2): qty 1

## 2017-12-10 MED ORDER — DEXAMETHASONE SODIUM PHOSPHATE 10 MG/ML IJ SOLN
INTRAMUSCULAR | Status: DC | PRN
  Administered 2017-12-10: 4 mg via INTRAVENOUS

## 2017-12-10 MED ORDER — EPHEDRINE 5 MG/ML INJ
INTRAVENOUS | Status: AC
  Filled 2017-12-10: qty 10

## 2017-12-10 MED ORDER — SODIUM CHLORIDE 0.9 % IV SOLN
2.0000 g | INTRAVENOUS | Status: AC
  Administered 2017-12-10: 2 g via INTRAVENOUS
  Filled 2017-12-10: qty 2

## 2017-12-10 SURGICAL SUPPLY — 57 items
ADH SKN CLS APL DERMABOND .7 (GAUZE/BANDAGES/DRESSINGS) ×2
APL SKNCLS STERI-STRIP NONHPOA (GAUZE/BANDAGES/DRESSINGS)
APL SWBSTK 6 STRL LF DISP (MISCELLANEOUS) ×6
APPLICATOR COTTON TIP 6 STRL (MISCELLANEOUS) ×6 IMPLANT
APPLICATOR COTTON TIP 6IN STRL (MISCELLANEOUS) ×12
APPLIER CLIP ROT 10 11.4 M/L (STAPLE) ×4
APR CLP MED LRG 11.4X10 (STAPLE) ×2
BAG SPEC RTRVL 10 TROC 200 (ENDOMECHANICALS) ×2
BAG SPEC RTRVL LRG 6X4 10 (ENDOMECHANICALS)
BENZOIN TINCTURE PRP APPL 2/3 (GAUZE/BANDAGES/DRESSINGS) IMPLANT
CABLE HIGH FREQUENCY MONO STRZ (ELECTRODE) IMPLANT
CATH REDDICK CHOLANGI 4FR 50CM (CATHETERS) ×4 IMPLANT
CHLORAPREP W/TINT 26ML (MISCELLANEOUS) ×4 IMPLANT
CLIP APPLIE ROT 10 11.4 M/L (STAPLE) ×2 IMPLANT
CLOSURE WOUND 1/2 X4 (GAUZE/BANDAGES/DRESSINGS)
COVER MAYO STAND STRL (DRAPES) ×4 IMPLANT
COVER SURGICAL LIGHT HANDLE (MISCELLANEOUS) ×4 IMPLANT
CUTTER FLEX LINEAR 45M (STAPLE) ×4 IMPLANT
DECANTER SPIKE VIAL GLASS SM (MISCELLANEOUS) IMPLANT
DERMABOND ADVANCED (GAUZE/BANDAGES/DRESSINGS) ×2
DERMABOND ADVANCED .7 DNX12 (GAUZE/BANDAGES/DRESSINGS) ×2 IMPLANT
DRAPE C-ARM 42X120 X-RAY (DRAPES) ×4 IMPLANT
DRAPE LAPAROSCOPIC ABDOMINAL (DRAPES) IMPLANT
ELECT PENCIL ROCKER SW 15FT (MISCELLANEOUS) ×4 IMPLANT
ELECT REM PT RETURN 15FT ADLT (MISCELLANEOUS) ×4 IMPLANT
ENDOLOOP SUT PDS II  0 18 (SUTURE)
ENDOLOOP SUT PDS II 0 18 (SUTURE) IMPLANT
GLOVE BIOGEL M 8.0 STRL (GLOVE) ×4 IMPLANT
GOWN STRL REUS W/TWL XL LVL3 (GOWN DISPOSABLE) ×12 IMPLANT
HEMOSTAT SURGICEL 4X8 (HEMOSTASIS) IMPLANT
IV CATH 14GX2 1/4 (CATHETERS) ×4 IMPLANT
KIT BASIN OR (CUSTOM PROCEDURE TRAY) ×4 IMPLANT
L-HOOK LAP DISP 36CM (ELECTROSURGICAL) ×4
LHOOK LAP DISP 36CM (ELECTROSURGICAL) ×2 IMPLANT
PAD POSITIONING PINK XL (MISCELLANEOUS) ×4 IMPLANT
POUCH RETRIEVAL ECOSAC 10 (ENDOMECHANICALS) ×2 IMPLANT
POUCH RETRIEVAL ECOSAC 10MM (ENDOMECHANICALS) ×2
POUCH SPECIMEN RETRIEVAL 10MM (ENDOMECHANICALS) IMPLANT
RELOAD 45 VASCULAR/THIN (ENDOMECHANICALS) ×4 IMPLANT
RELOAD STAPLE TA45 3.5 REG BLU (ENDOMECHANICALS) IMPLANT
SCISSORS LAP 5X45 EPIX DISP (ENDOMECHANICALS) ×4 IMPLANT
SET IRRIG TUBING LAPAROSCOPIC (IRRIGATION / IRRIGATOR) ×4 IMPLANT
SHEARS HARMONIC ACE PLUS 45CM (MISCELLANEOUS) ×4 IMPLANT
SLEEVE XCEL OPT CAN 5 100 (ENDOMECHANICALS) ×4 IMPLANT
STAPLER VISISTAT 35W (STAPLE) ×4 IMPLANT
STRIP CLOSURE SKIN 1/2X4 (GAUZE/BANDAGES/DRESSINGS) IMPLANT
SUT MNCRL AB 4-0 PS2 18 (SUTURE) ×4 IMPLANT
SYR 20CC LL (SYRINGE) ×4 IMPLANT
TOWEL OR 17X26 10 PK STRL BLUE (TOWEL DISPOSABLE) ×4 IMPLANT
TRAY FOLEY MTR SLVR 14FR STAT (SET/KITS/TRAYS/PACK) IMPLANT
TRAY FOLEY MTR SLVR 16FR STAT (SET/KITS/TRAYS/PACK) IMPLANT
TRAY LAPAROSCOPIC (CUSTOM PROCEDURE TRAY) ×4 IMPLANT
TROCAR BLADELESS OPT 5 100 (ENDOMECHANICALS) ×4 IMPLANT
TROCAR UNIVERSAL OPT 12M 100M (ENDOMECHANICALS) ×4 IMPLANT
TROCAR XCEL BLUNT TIP 100MML (ENDOMECHANICALS) ×4 IMPLANT
TROCAR XCEL NON-BLD 11X100MML (ENDOMECHANICALS) ×4 IMPLANT
TUBING INSUF HEATED (TUBING) ×4 IMPLANT

## 2017-12-10 NOTE — Op Note (Signed)
[name]  Primary Care Physician:  Velna Hatchet, MD    12/10/2017  9:54 AM  Procedure: Laparoscopic Cholecystectomy with intraoperative cholangiogram lap appendectomy   Surgeon: Catalina Antigua [name], MD, FACS Asst:  Alphonsa Overall, MD, FACS  Anes:  General  Drains:  None  Findings: Severe fibrotic cholecystitis with normal IOC; appendix with hx of appendicitis  Description of Procedure: The patient was taken to OR 2 and given general anesthesia.  The patient was prepped with Technicare and draped sterilely. A time out was performed.  Access to the abdomen was achieved with a 5 mm Optiview through the right upper quadrant.  Port placement included two 5 mm and two 12 mm.  The ClickClean device was utilized with the 0 degree scope.    The gallbladder was visualized and was completely walled off.  Blunt dissection was used to strip away the adherent omentum reveal quite, thickened gallbladder with fibrotic changes that were severe.  Is able to eventually free the gallbladder and reveal the infundibulum and dissected out the cystic duct.  Traction on the infundibulum allowed for successful demonstration of the critical view. Inflammatory changes were severe.  The cystic duct was identified and clipped up on the gallbladder and an incision was made in the cystic duct and the Reddick catheter was inserted after milking the cystic duct of any debris. A dynamic cholangiogram was performed which demonstrated a plump common bile hepatic filling and air bubble and with free flow into the duodenum..    The cystic duct was then triple clipped and divided, the cystic artery was double clipped and divided and then the gallbladder was removed from the gallbladder bed. Removal of the gallbladder from the gallbladder bed was performed with electrocautery.  It was fused and did not enter it receiving some white bile.  No stone spillage..  The gallbladder was then placed in a bag . The gallbladder bed was  inspected and no bleeding or bile leaks were seen.   The bag was left on top of the liver while we then focused my attention in the appendix.  The base was isolated and then applied a vascular load stapler 4.5 mm Ethicon stapler to transect this.  Used a harmonic scalpel mesentery placed the appendix in the bag and brought out through the 12 mm port.  Laparoscopic visualization was used when closing fascial defects for trocar sites.   Incisions were injected with Exparel and closed with 4-0 Monocryl and Dermabond on the skin.  Sponge and needle count were correct.    The patient was taken to the recovery room in satisfactory condition.

## 2017-12-10 NOTE — Anesthesia Procedure Notes (Signed)
Procedure Name: Intubation Date/Time: 12/10/2017 7:48 AM Performed by: Garrel Ridgel, CRNA Pre-anesthesia Checklist: Patient identified, Suction available, Emergency Drugs available, Patient being monitored and Timeout performed Patient Re-evaluated:Patient Re-evaluated prior to induction Oxygen Delivery Method: Circle system utilized Preoxygenation: Pre-oxygenation with 100% oxygen Induction Type: IV induction Ventilation: Mask ventilation without difficulty Laryngoscope Size: Mac and 3 Grade View: Grade II Tube size: 7.0 mm Number of attempts: 1 Airway Equipment and Method: Stylet Placement Confirmation: ETT inserted through vocal cords under direct vision,  positive ETCO2 and breath sounds checked- equal and bilateral Secured at: 21 cm Tube secured with: Tape Dental Injury: Teeth and Oropharynx as per pre-operative assessment

## 2017-12-10 NOTE — Interval H&P Note (Signed)
History and Physical Interval Note:  12/10/2017 7:21 AM  Brandy Paul  has presented today for surgery, with the diagnosis of gallstones and chronic appendicitis  The various methods of treatment have been discussed with the patient and family. After consideration of risks, benefits and other options for treatment, the patient has consented to  Procedure(s): [place] (N/A) APPENDECTOMY LAPAROSCOPIC (N/A) as a surgical intervention .  The patient's history has been reviewed, patient examined, no change in status, stable for surgery.  I have reviewed the patient's chart and labs.  Questions were answered to the patient's satisfaction.     Pedro Earls

## 2017-12-10 NOTE — Transfer of Care (Signed)
Immediate Anesthesia Transfer of Care Note  Patient: Brandy Paul  Procedure(s) Performed: LAPAROSCOPIC CHOLECYSTECTOMY WITH INTRAOPERATIVE  ERAS PATHWAY (N/A Abdomen) APPENDECTOMY LAPAROSCOPIC (N/A Abdomen)  Patient Location: PACU  Anesthesia Type:General  Level of Consciousness: awake and oriented  Airway & Oxygen Therapy: Patient Spontanous Breathing and Patient connected to face mask oxygen  Post-op Assessment: Report given to RN and Post -op Vital signs reviewed and stable  Post vital signs: Reviewed and stable  Last Vitals:  Vitals Value Taken Time  BP 144/68 12/10/2017 10:07 AM  Temp    Pulse 84 12/10/2017 10:10 AM  Resp 18 12/10/2017 10:10 AM  SpO2 97 % 12/10/2017 10:10 AM  Vitals shown include unvalidated device data.  Last Pain:  Vitals:   12/10/17 0558  TempSrc:   PainSc: 0-No pain      Patients Stated Pain Goal: 4 (57/01/77 9390)  Complications: No apparent anesthesia complications

## 2017-12-10 NOTE — Anesthesia Preprocedure Evaluation (Signed)
Anesthesia Evaluation  Patient identified by MRN, date of birth, ID band Patient awake    Reviewed: Allergy & Precautions, NPO status , Patient's Chart, lab work & pertinent test results  Airway Mallampati: II  TM Distance: >3 FB Neck ROM: Full    Dental no notable dental hx.    Pulmonary neg pulmonary ROS, former smoker,    Pulmonary exam normal breath sounds clear to auscultation       Cardiovascular hypertension, + Peripheral Vascular Disease  Normal cardiovascular exam Rhythm:Regular Rate:Normal     Neuro/Psych CVA, Residual Symptoms negative psych ROS   GI/Hepatic Neg liver ROS, GERD  ,  Endo/Other  negative endocrine ROS  Renal/GU negative Renal ROS  negative genitourinary   Musculoskeletal negative musculoskeletal ROS (+)   Abdominal   Peds negative pediatric ROS (+)  Hematology negative hematology ROS (+)   Anesthesia Other Findings   Reproductive/Obstetrics negative OB ROS                             Anesthesia Physical Anesthesia Plan  ASA: III  Anesthesia Plan: General   Post-op Pain Management:    Induction: Intravenous  PONV Risk Score and Plan: 3 and Ondansetron, Dexamethasone and Treatment may vary due to age or medical condition  Airway Management Planned: Oral ETT  Additional Equipment:   Intra-op Plan:   Post-operative Plan: Extubation in OR  Informed Consent: I have reviewed the patients History and Physical, chart, labs and discussed the procedure including the risks, benefits and alternatives for the proposed anesthesia with the patient or authorized representative who has indicated his/her understanding and acceptance.   Dental advisory given  Plan Discussed with: CRNA and Surgeon  Anesthesia Plan Comments:         Anesthesia Quick Evaluation

## 2017-12-11 ENCOUNTER — Encounter (HOSPITAL_COMMUNITY): Payer: Self-pay | Admitting: Surgery

## 2017-12-11 DIAGNOSIS — K8012 Calculus of gallbladder with acute and chronic cholecystitis without obstruction: Secondary | ICD-10-CM | POA: Diagnosis not present

## 2017-12-11 DIAGNOSIS — I739 Peripheral vascular disease, unspecified: Secondary | ICD-10-CM | POA: Diagnosis not present

## 2017-12-11 DIAGNOSIS — I10 Essential (primary) hypertension: Secondary | ICD-10-CM | POA: Diagnosis not present

## 2017-12-11 DIAGNOSIS — I69351 Hemiplegia and hemiparesis following cerebral infarction affecting right dominant side: Secondary | ICD-10-CM | POA: Diagnosis not present

## 2017-12-11 DIAGNOSIS — Z79899 Other long term (current) drug therapy: Secondary | ICD-10-CM | POA: Diagnosis not present

## 2017-12-11 DIAGNOSIS — Z7982 Long term (current) use of aspirin: Secondary | ICD-10-CM | POA: Diagnosis not present

## 2017-12-11 MED ORDER — HYDROCODONE-ACETAMINOPHEN 5-325 MG PO TABS: 1.0000 | ORAL_TABLET | ORAL | 0 refills | Status: DC | PRN

## 2017-12-11 NOTE — Discharge Summary (Signed)
Physician Discharge Summary  Patient ID: Brandy Paul MRN: [id number] DOB/AGE: 1946-02-23 72 y.o.  PCP: Velna Hatchet, MD  Admit date: 12/10/2017 Discharge date: 12/11/2017  Admission Diagnoses:  History of prior appendicitis and cholecystitis  Discharge Diagnoses:  same  Active Problems:   S/P laparoscopic cholecystectomy   Surgery:  Laparoscopic cholecystectomy with IOC and appendectomy  Discharged Condition: improved  Hospital Course:   Had surgery on Monday.  Kept overnight for observation on telemetry and ready for discharge on Tuesday.    Consults: none  Significant Diagnostic Studies: path pending    Discharge Exam: Blood pressure 135/66, pulse 79, temperature 98.6 F (37 C), temperature source Oral, resp. rate 18, height 5\' 3"  (1.6 m), weight 92.1 kg, SpO2 94 %. Incisions are bland  Disposition: Discharge disposition: 01-Home or Self Care       Discharge Instructions    Diet - low sodium heart healthy   Complete by:  As directed    Discharge instructions   Complete by:  As directed    May resume Plavix tomorrow or otherwise how directed by your primary care physician   Increase activity slowly   Complete by:  As directed    No wound care   Complete by:  As directed      Allergies as of 12/11/2017      Reactions   Ceclor [cefaclor] Other (See Comments)   REACTION: "SEVERE HEADACHE"      Medication List    TAKE these medications   acetaminophen 500 MG tablet Commonly known as:  TYLENOL Take 500 mg by mouth daily as needed for mild pain.   aspirin EC 81 MG tablet Take 81 mg by mouth daily.   atorvastatin 40 MG tablet Commonly known as:  LIPITOR Take 1 tablet (40 mg total) by mouth at bedtime.   buPROPion 150 MG 12 hr tablet Commonly known as:  WELLBUTRIN SR Take 150 mg by mouth every evening.   clopidogrel 75 MG tablet Commonly known as:  PLAVIX Take 1 tablet (75 mg total) by mouth at bedtime.   ezetimibe 10 MG tablet Commonly  known as:  ZETIA Take 10 mg by mouth at bedtime.   HYDROcodone-acetaminophen 5-325 MG tablet Commonly known as:  NORCO/VICODIN Take 1-2 tablets by mouth every 4 (four) hours as needed for moderate pain.   isosorbide mononitrate 60 MG 24 hr tablet Commonly known as:  IMDUR TAKE 1 TABLET BY MOUTH DAILY What changed:  when to take this   nitroGLYCERIN 0.4 MG SL tablet Commonly known as:  NITROSTAT Place 1 tablet (0.4 mg total) under the tongue every 5 (five) minutes as needed for chest pain (Call 911 at 3rd dose within 15 minutes).   OSCAL 500/200 D-3 PO Take 1 tablet by mouth every evening.   RABEprazole 20 MG tablet Commonly known as:  ACIPHEX Take 20 mg by mouth at bedtime.   sertraline 100 MG tablet Commonly known as:  ZOLOFT Take 100 mg by mouth at bedtime.   SPIRIVA HANDIHALER 18 MCG inhalation capsule Generic drug:  tiotropium Place 18 mcg into inhaler and inhale every evening.      Follow-up Information    Johnathan Hausen, MD. Schedule an appointment as soon as possible for a visit in 3 week(s).   Specialty:  General Surgery Contact information: [address] [place] [address] [phone]           Signed: Pedro Earls 12/11/2017, 7:23 AM

## 2017-12-11 NOTE — Anesthesia Postprocedure Evaluation (Signed)
Anesthesia Post Note  Patient: RIELYNN TRULSON  Procedure(s) Performed: LAPAROSCOPIC CHOLECYSTECTOMY WITH INTRAOPERATIVE  ERAS PATHWAY (N/A Abdomen) APPENDECTOMY LAPAROSCOPIC (N/A Abdomen)     Patient location during evaluation: PACU Anesthesia Type: General Level of consciousness: awake and alert Pain management: pain level controlled Vital Signs Assessment: post-procedure vital signs reviewed and stable Respiratory status: spontaneous breathing, nonlabored ventilation, respiratory function stable and patient connected to nasal cannula oxygen Cardiovascular status: blood pressure returned to baseline and stable Postop Assessment: no apparent nausea or vomiting Anesthetic complications: no    Last Vitals:  Vitals:   12/10/17 2022 12/11/17 0541  BP: 135/61 135/66  Pulse: 70 79  Resp: 16 18  Temp: 36.5 C 37 C  SpO2: 96% 94%    Last Pain:  Vitals:   12/11/17 0630  TempSrc:   PainSc: 7                  Kennette Cuthrell S

## 2017-12-11 NOTE — Discharge Instructions (Signed)

## 2017-12-17 DIAGNOSIS — H1089 Other conjunctivitis: Secondary | ICD-10-CM | POA: Diagnosis not present

## 2017-12-17 DIAGNOSIS — Z23 Encounter for immunization: Secondary | ICD-10-CM | POA: Diagnosis not present

## 2017-12-17 DIAGNOSIS — Z6836 Body mass index (BMI) 36.0-36.9, adult: Secondary | ICD-10-CM | POA: Diagnosis not present

## 2017-12-17 DIAGNOSIS — K811 Chronic cholecystitis: Secondary | ICD-10-CM | POA: Diagnosis not present

## 2017-12-17 DIAGNOSIS — L258 Unspecified contact dermatitis due to other agents: Secondary | ICD-10-CM | POA: Diagnosis not present

## 2017-12-24 DIAGNOSIS — I2510 Atherosclerotic heart disease of native coronary artery without angina pectoris: Secondary | ICD-10-CM | POA: Diagnosis not present

## 2017-12-24 DIAGNOSIS — J432 Centrilobular emphysema: Secondary | ICD-10-CM | POA: Diagnosis not present

## 2017-12-24 DIAGNOSIS — I69351 Hemiplegia and hemiparesis following cerebral infarction affecting right dominant side: Secondary | ICD-10-CM | POA: Diagnosis not present

## 2017-12-24 DIAGNOSIS — E119 Type 2 diabetes mellitus without complications: Secondary | ICD-10-CM | POA: Diagnosis not present

## 2017-12-24 DIAGNOSIS — M810 Age-related osteoporosis without current pathological fracture: Secondary | ICD-10-CM | POA: Diagnosis not present

## 2017-12-24 DIAGNOSIS — I69320 Aphasia following cerebral infarction: Secondary | ICD-10-CM | POA: Diagnosis not present

## 2017-12-27 DIAGNOSIS — I2510 Atherosclerotic heart disease of native coronary artery without angina pectoris: Secondary | ICD-10-CM | POA: Diagnosis not present

## 2017-12-27 DIAGNOSIS — M810 Age-related osteoporosis without current pathological fracture: Secondary | ICD-10-CM | POA: Diagnosis not present

## 2017-12-27 DIAGNOSIS — I69320 Aphasia following cerebral infarction: Secondary | ICD-10-CM | POA: Diagnosis not present

## 2017-12-27 DIAGNOSIS — E119 Type 2 diabetes mellitus without complications: Secondary | ICD-10-CM | POA: Diagnosis not present

## 2017-12-27 DIAGNOSIS — I69351 Hemiplegia and hemiparesis following cerebral infarction affecting right dominant side: Secondary | ICD-10-CM | POA: Diagnosis not present

## 2017-12-27 DIAGNOSIS — J432 Centrilobular emphysema: Secondary | ICD-10-CM | POA: Diagnosis not present

## 2017-12-31 DIAGNOSIS — M810 Age-related osteoporosis without current pathological fracture: Secondary | ICD-10-CM | POA: Diagnosis not present

## 2017-12-31 DIAGNOSIS — J432 Centrilobular emphysema: Secondary | ICD-10-CM | POA: Diagnosis not present

## 2017-12-31 DIAGNOSIS — E119 Type 2 diabetes mellitus without complications: Secondary | ICD-10-CM | POA: Diagnosis not present

## 2017-12-31 DIAGNOSIS — I2510 Atherosclerotic heart disease of native coronary artery without angina pectoris: Secondary | ICD-10-CM | POA: Diagnosis not present

## 2017-12-31 DIAGNOSIS — I69351 Hemiplegia and hemiparesis following cerebral infarction affecting right dominant side: Secondary | ICD-10-CM | POA: Diagnosis not present

## 2017-12-31 DIAGNOSIS — I69320 Aphasia following cerebral infarction: Secondary | ICD-10-CM | POA: Diagnosis not present

## 2018-01-02 DIAGNOSIS — E119 Type 2 diabetes mellitus without complications: Secondary | ICD-10-CM | POA: Diagnosis not present

## 2018-01-02 DIAGNOSIS — I2510 Atherosclerotic heart disease of native coronary artery without angina pectoris: Secondary | ICD-10-CM | POA: Diagnosis not present

## 2018-01-02 DIAGNOSIS — M810 Age-related osteoporosis without current pathological fracture: Secondary | ICD-10-CM | POA: Diagnosis not present

## 2018-01-02 DIAGNOSIS — I69351 Hemiplegia and hemiparesis following cerebral infarction affecting right dominant side: Secondary | ICD-10-CM | POA: Diagnosis not present

## 2018-01-02 DIAGNOSIS — J432 Centrilobular emphysema: Secondary | ICD-10-CM | POA: Diagnosis not present

## 2018-01-02 DIAGNOSIS — I69320 Aphasia following cerebral infarction: Secondary | ICD-10-CM | POA: Diagnosis not present

## 2018-01-09 DIAGNOSIS — M810 Age-related osteoporosis without current pathological fracture: Secondary | ICD-10-CM | POA: Diagnosis not present

## 2018-01-09 DIAGNOSIS — E119 Type 2 diabetes mellitus without complications: Secondary | ICD-10-CM | POA: Diagnosis not present

## 2018-01-09 DIAGNOSIS — I2510 Atherosclerotic heart disease of native coronary artery without angina pectoris: Secondary | ICD-10-CM | POA: Diagnosis not present

## 2018-01-09 DIAGNOSIS — J432 Centrilobular emphysema: Secondary | ICD-10-CM | POA: Diagnosis not present

## 2018-01-09 DIAGNOSIS — I69320 Aphasia following cerebral infarction: Secondary | ICD-10-CM | POA: Diagnosis not present

## 2018-01-09 DIAGNOSIS — I69351 Hemiplegia and hemiparesis following cerebral infarction affecting right dominant side: Secondary | ICD-10-CM | POA: Diagnosis not present

## 2018-02-25 ENCOUNTER — Encounter (HOSPITAL_COMMUNITY): Payer: Self-pay | Admitting: *Deleted

## 2018-02-25 ENCOUNTER — Emergency Department (HOSPITAL_COMMUNITY)
Admission: EM | Admit: 2018-02-25 | Discharge: 2018-02-25 | Disposition: A | Discharge status: Home / Self Care | Payer: Medicare Other | Attending: Emergency Medicine | Admitting: Emergency Medicine

## 2018-02-25 DIAGNOSIS — E119 Type 2 diabetes mellitus without complications: Secondary | ICD-10-CM | POA: Diagnosis not present

## 2018-02-25 DIAGNOSIS — R5383 Other fatigue: Secondary | ICD-10-CM | POA: Diagnosis present

## 2018-02-25 DIAGNOSIS — M810 Age-related osteoporosis without current pathological fracture: Secondary | ICD-10-CM | POA: Diagnosis not present

## 2018-02-25 DIAGNOSIS — D509 Iron deficiency anemia, unspecified: Secondary | ICD-10-CM | POA: Diagnosis not present

## 2018-02-25 DIAGNOSIS — Z853 Personal history of malignant neoplasm of breast: Secondary | ICD-10-CM | POA: Insufficient documentation

## 2018-02-25 DIAGNOSIS — R82998 Other abnormal findings in urine: Secondary | ICD-10-CM | POA: Diagnosis not present

## 2018-02-25 DIAGNOSIS — J449 Chronic obstructive pulmonary disease, unspecified: Secondary | ICD-10-CM | POA: Diagnosis not present

## 2018-02-25 DIAGNOSIS — Z87891 Personal history of nicotine dependence: Secondary | ICD-10-CM | POA: Diagnosis not present

## 2018-02-25 DIAGNOSIS — D6489 Other specified anemias: Secondary | ICD-10-CM | POA: Diagnosis not present

## 2018-02-25 DIAGNOSIS — E7849 Other hyperlipidemia: Secondary | ICD-10-CM | POA: Diagnosis not present

## 2018-02-25 LAB — CBC
HEMATOCRIT: 30 % — AB (ref 36.0–46.0)
Hemoglobin: 8 g/dL — ABNORMAL LOW (ref 12.0–15.0)
MCH: 19.5 pg — ABNORMAL LOW (ref 26.0–34.0)
MCHC: 26.7 g/dL — ABNORMAL LOW (ref 30.0–36.0)
MCV: 73.2 fL — ABNORMAL LOW (ref 80.0–100.0)
Platelets: 240 10*3/uL (ref 150–400)
RBC: 4.1 MIL/uL (ref 3.87–5.11)
RDW: 19.9 % — AB (ref 11.5–15.5)
WBC: 6.1 10*3/uL (ref 4.0–10.5)
nRBC: 0 % (ref 0.0–0.2)

## 2018-02-25 LAB — CBC WITH DIFFERENTIAL/PLATELET
Abs Immature Granulocytes: 0.02 10*3/uL (ref 0.00–0.07)
Basophils Absolute: 0 10*3/uL (ref 0.0–0.1)
Basophils Relative: 1 %
EOS ABS: 0.2 10*3/uL (ref 0.0–0.5)
EOS PCT: 3 %
HEMATOCRIT: 32.5 % — AB (ref 36.0–46.0)
Hemoglobin: 8.6 g/dL — ABNORMAL LOW (ref 12.0–15.0)
Immature Granulocytes: 0 %
LYMPHS ABS: 1.5 10*3/uL (ref 0.7–4.0)
Lymphocytes Relative: 27 %
MCH: 19.5 pg — AB (ref 26.0–34.0)
MCHC: 26.5 g/dL — AB (ref 30.0–36.0)
MCV: 73.7 fL — ABNORMAL LOW (ref 80.0–100.0)
MONO ABS: 0.6 10*3/uL (ref 0.1–1.0)
Monocytes Relative: 11 %
Neutro Abs: 3.3 10*3/uL (ref 1.7–7.7)
Neutrophils Relative %: 58 %
Platelets: 246 10*3/uL (ref 150–400)
RBC: 4.41 MIL/uL (ref 3.87–5.11)
RDW: 20 % — AB (ref 11.5–15.5)
WBC: 5.6 10*3/uL (ref 4.0–10.5)
nRBC: 0 % (ref 0.0–0.2)

## 2018-02-25 LAB — POC OCCULT BLOOD, ED: Fecal Occult Bld: POSITIVE — AB

## 2018-02-25 LAB — COMPREHENSIVE METABOLIC PANEL
ALT: 17 U/L (ref 0–44)
ANION GAP: 12 (ref 5–15)
AST: 18 U/L (ref 15–41)
Albumin: 3.4 g/dL — ABNORMAL LOW (ref 3.5–5.0)
Alkaline Phosphatase: 82 U/L (ref 38–126)
BILIRUBIN TOTAL: 0.4 mg/dL (ref 0.3–1.2)
BUN: 15 mg/dL (ref 8–23)
CALCIUM: 9.1 mg/dL (ref 8.9–10.3)
CO2: 25 mmol/L (ref 22–32)
Chloride: 105 mmol/L (ref 98–111)
Creatinine, Ser: 1.05 mg/dL — ABNORMAL HIGH (ref 0.44–1.00)
GFR calc non Af Amer: 53 mL/min — ABNORMAL LOW (ref >60)
GLUCOSE: 119 mg/dL — AB (ref 70–99)
POTASSIUM: 3.9 mmol/L (ref 3.5–5.1)
Sodium: 142 mmol/L (ref 135–145)
TOTAL PROTEIN: 6.5 g/dL (ref 6.5–8.1)

## 2018-02-25 LAB — TYPE AND SCREEN
ABO/RH(D): A POS
ANTIBODY SCREEN: NEGATIVE

## 2018-02-25 LAB — ABO/RH: ABO/RH(D): A POS

## 2018-02-25 NOTE — ED Provider Notes (Signed)
[facility] Emergency Department Provider Note MRN:  [id number]  Arrival date & time: 02/25/18     Chief Complaint   Abnormal Lab   History of Present Illness   Brandy Paul is a 72 y.o. year-old female with a history of stroke, COPD, iron deficiency anemia presenting to the ED with chief complaint of abnormal lab.  Sent here from PCPs office due to concern over hemoglobin level of 6.4.  Patient endorsing increased fatigue for weeks to months, but denies any other symptoms.  No recent fever, no headache, no vision change, no chest pain, no shortness of breath, no abdominal pain, no new numbness or weakness.  No blood in stool, no black stool.  Takes daily baby aspirin and Plavix, was told to stop taking it today by PCP.  Review of Systems  A complete 10 system review of systems was obtained and all systems are negative except as noted in the HPI and PMH.   Patient's Health History    Past Medical History:  Diagnosis Date  . Breast cancer ([place]) 2005   left  . COPD (chronic obstructive pulmonary disease) ([facility])    hx of tobacco use  . Coronary artery spasm ([place])   . Depression   . GERD (gastroesophageal reflux disease)   . Hyperlipemia   . IDA (iron deficiency anemia)    last iron infusion 4 months ago  . Osteoporosis   . 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 Prediabetes    hx of  . Rash    under right breast and groin line using nizoral ointment to q day per dermatology saw few weeks ago  . Stress incontinence wers depends  . Stroke [facility]) 2013   paralytic syndrome of dominant right side  . Toe fracture, left     Past Surgical History:  Procedure Laterality Date  . ABDOMINAL HYSTERECTOMY  1989   complete  . CHOLECYSTECTOMY N/A 12/10/2017   Procedure: LAPAROSCOPIC CHOLECYSTECTOMY WITH INTRAOPERATIVE  ERAS PATHWAY;  Surgeon: Johnathan Hausen, MD;  Location: [facility] ORS;  Service: General;  Laterality: N/A;  . COLONOSCOPY WITH PROPOFOL N/A 12/03/2015   Procedure: COLONOSCOPY WITH PROPOFOL;  Surgeon: Mauri Pole, MD;  Location: [facility] ENDOSCOPY;  Service: Endoscopy;  Laterality: N/A;  tba before procdcedure  . IR CHOLANGIOGRAM EXISTING TUBE  08/08/2017  . IR CHOLANGIOGRAM EXISTING TUBE  08/27/2017  . IR PERC CHOLECYSTOSTOMY  07/13/2017  . LAPAROSCOPIC APPENDECTOMY N/A 12/10/2017   Procedure: APPENDECTOMY LAPAROSCOPIC;  Surgeon: Johnathan Hausen, MD;  Location: [facility] ORS;  Service: General;  Laterality: N/A;  . MASTECTOMY Left 2005   1 lymph node removed  . RADIAL KERATOTOMY Bilateral   . ROBOTIC ASSISTED BILATERAL SALPINGO OOPHERECTOMY Bilateral 11/04/2015   Procedure: XI ROBOTIC ASSISTED BILATERAL SALPINGO OOPHORECTOMY;  Surgeon: Everitt Amber, MD;  Location: [facility] OR[facility];  Service: Gynecology;  Laterality: Bilateral;  . TONSILLECTOMY      Family History  Problem Relation Age of Onset  . CVA Mother   . Liver cancer Mother   . Stroke Maternal Grandfather   . Hypertension Sister   . Arthritis Sister   . Diabetes Sister   . Heart attack Sister     Social History   Socioeconomic History  . Marital status: Married    Spouse name: [name]  . Number of children: 4  . Years of education: 31  . Highest education level: Not on file  Occupational History  . Occupation: retired  Scientific laboratory technician  . Emergency planning/management officer  strain: Not on file  . Food insecurity:    Worry: Not on file    Inability: Not on file  . Transportation needs:    Medical: Not on file    Non-medical: Not on file  Tobacco Use  . Smoking status: Former Smoker    Types: Cigarettes    Last attempt to quit: 02/17/1994    Years since quitting: 24.0  . Smokeless tobacco: Never Used  Substance and Sexual Activity  . Alcohol use: No    Alcohol/week: 0.0 standard drinks  . Drug use: No  . Sexual activity: Never  Lifestyle  . Physical activity:    Days per week: Not on file    Minutes per session: Not on file  . Stress: Not on file  Relationships  . Social connections:    Talks on  phone: Not on file    Gets together: Not on file    Attends religious service: Not on file    Active member of club or organization: Not on file    Attends meetings of clubs or organizations: Not on file    Relationship status: Not on file  . Intimate partner violence:    Fear of current or ex partner: Not on file    Emotionally abused: Not on file    Physically abused: Not on file    Forced sexual activity: Not on file  Other Topics Concern  . Not on file  Social History Narrative   Lives with spouse   1-2 cups coffee     Physical Exam  Vital Signs and Nursing Notes reviewed Vitals:   02/25/18 1630 02/25/18 1700  BP: 138/65 138/63  Pulse: 65 65  Resp: 18 16  Temp:    SpO2: 99% 95%    CONSTITUTIONAL: Well-appearing, NAD NEURO:  Alert and oriented x 3, chronic right-sided weakness EYES:  eyes equal and reactive ENT/NECK:  no LAD, no JVD CARDIO: Regular rate, well-perfused, normal S1 and S2 PULM:  CTAB no wheezing or rhonchi GI/GU:  normal bowel sounds, non-distended, non-tender; no external hemorrhoids, no melena on rectal exam, no gross blood, solid stool in rectal vault MSK/SPINE:  No gross deformities, no edema SKIN:  no rash, atraumatic PSYCH:  Appropriate speech and behavior  Diagnostic and Interventional Summary    EKG Interpretation  Date/Time:    Ventricular Rate:    PR Interval:    QRS Duration:   QT Interval:    QTC Calculation:   R Axis:     Text Interpretation:        Labs Reviewed  CBC WITH DIFFERENTIAL/PLATELET - Abnormal; Notable for the following components:      Result Value   Hemoglobin 8.6 (*)    HCT 32.5 (*)    MCV 73.7 (*)    MCH 19.5 (*)    MCHC 26.5 (*)    RDW 20.0 (*)    All other components within normal limits  COMPREHENSIVE METABOLIC PANEL - Abnormal; Notable for the following components:   Glucose, Bld 119 (*)    Creatinine, Ser 1.05 (*)    Albumin 3.4 (*)    GFR calc non Af Amer 53 (*)    All other components within  normal limits  CBC - Abnormal; Notable for the following components:   Hemoglobin 8.0 (*)    HCT 30.0 (*)    MCV 73.2 (*)    MCH 19.5 (*)    MCHC 26.7 (*)    RDW 19.9 (*)  All other components within normal limits  POC OCCULT BLOOD, ED - Abnormal; Notable for the following components:   Fecal Occult Bld POSITIVE (*)    All other components within normal limits  OCCULT BLOOD X 1 CARD TO LAB, STOOL  TYPE AND SCREEN  ABO/RH    No orders to display    Medications - No data to display   Procedures Critical Care  ED Course and Medical Decision Making  I have reviewed the triage vital signs and the nursing notes.  Pertinent labs & imaging results that were available during my care of the patient were reviewed by me and considered in my medical decision making (see below for details).  Sent by PCP given concern for anemia requiring transfusion.  However, hemoglobin measured here 8.6, trending down from prior levels but not indicating need for transfusion or admission.  Will repeat CBC to determine which level is accurate, awaiting fecal occult result.  With no symptoms, normal vital signs, anticipating discharge with close follow-up.  Repeat hemoglobin 8.0, again a level not indicative of need for urgent transfusion.  MCV is low, consistent with her prior iron deficiency anemia.  Fecal occult testing is positive however there was no melena on exam, patient had hard stools in her rectal vault, and per husband has a history of hemorrhoids.  In summary, admission to the hospital was offered so that the patient could be observed for this possible but unlikely GI source of bleeding.  Patient and husband prefer close outpatient management given the more likely etiology of iron deficiency anemia.  Husband will continue home iron supplementation, they have close follow-up in place with PCP.  Strict return precautions provided.  After the discussed management above, the patient was determined to  be safe for discharge.  The patient was in agreement with this plan and all questions regarding their care were answered.  ED return precautions were discussed and the patient will return to the ED with any significant worsening of condition.  Barth Kirks. Sedonia Small, MD [place] [email or web address]  Final Clinical Impressions(s) / ED Diagnoses     ICD-10-CM   1. Iron deficiency anemia, unspecified iron deficiency anemia type D50.9     ED Discharge Orders    None         Maudie Flakes, MD 02/25/18 1816

## 2018-02-25 NOTE — Discharge Instructions (Addendum)
You were evaluated in the Emergency Department and after careful evaluation, we did not find any emergent condition requiring admission or further testing in the hospital.  Your symptoms today seem to be due to anemia due to iron deficiency.  It is still possible that your anemia is related to bleeding from your gastrointestinal tract.  Please keep a close eye for blood in the stool or black stools, and return to the emergency department if you notice these.  Worsening fatigue or shortness of breath would also be reasons to return to the emergency department.  Otherwise, take the home iron supplements as we discussed and follow-up closely with your primary care provider.  Discussed with your primary care provider when to restart your aspirin and Plavix.  Please return to the Emergency Department if you experience any worsening of your condition.  We encourage you to follow up with a primary care provider.  Thank you for allowing [place] to be a part of your care.

## 2018-02-25 NOTE — ED Triage Notes (Signed)
Pt in after lab appointment with PCP, pt hgb was 6.4, they were advised to come in for further evaluation, denies shortness of breath or weakness, labs were ordered for routine physical

## 2018-03-04 DIAGNOSIS — E329 Disease of thymus, unspecified: Secondary | ICD-10-CM | POA: Diagnosis not present

## 2018-03-04 DIAGNOSIS — Z0000 Encounter for general adult medical examination without abnormal findings: Secondary | ICD-10-CM | POA: Diagnosis not present

## 2018-03-04 DIAGNOSIS — E1169 Type 2 diabetes mellitus with other specified complication: Secondary | ICD-10-CM | POA: Diagnosis not present

## 2018-03-04 DIAGNOSIS — D509 Iron deficiency anemia, unspecified: Secondary | ICD-10-CM | POA: Diagnosis not present

## 2018-03-04 DIAGNOSIS — G459 Transient cerebral ischemic attack, unspecified: Secondary | ICD-10-CM | POA: Diagnosis not present

## 2018-04-21 ENCOUNTER — Other Ambulatory Visit: Payer: Self-pay | Admitting: Interventional Cardiology

## 2018-04-29 ENCOUNTER — Encounter: Payer: Self-pay | Admitting: Adult Health

## 2018-04-29 ENCOUNTER — Ambulatory Visit (INDEPENDENT_AMBULATORY_CARE_PROVIDER_SITE_OTHER): Payer: Medicare Other | Admitting: Adult Health

## 2018-04-29 VITALS — BP 140/60 | HR 78 | Ht 64.0 in | Wt 196.0 lb

## 2018-04-29 DIAGNOSIS — G459 Transient cerebral ischemic attack, unspecified: Secondary | ICD-10-CM

## 2018-04-29 DIAGNOSIS — I10 Essential (primary) hypertension: Secondary | ICD-10-CM | POA: Diagnosis not present

## 2018-04-29 DIAGNOSIS — I6522 Occlusion and stenosis of left carotid artery: Secondary | ICD-10-CM | POA: Diagnosis not present

## 2018-04-29 DIAGNOSIS — E7849 Other hyperlipidemia: Secondary | ICD-10-CM | POA: Diagnosis not present

## 2018-04-29 NOTE — Progress Notes (Signed)
[facility] [address] [place]. [place] [address] [phone]       OFFICE FOLLOW UP NOTE  Brandy Paul Date of Birth:  [birth year] Medical Record Number:  [id number]   Reason for Referral:  hospital TIA follow up  CHIEF COMPLAINT:  Chief Complaint  Patient presents with  . Follow-up    Follow up room in back hallway pt with husband [name]    HPI:  04/29/18  Brandy Paul is a 73 year old female who is being followed in this office after left MCA in 09/2017 and is accompanied by her husband.  She continues to do well from a stroke standpoint with baseline expressive aphasia and RUE weakness/spasticity without new or worsening stroke/TIA symptoms.  She is currently in a wheelchair for transportation to this appointment but at home will use a cane or walker.  Denies any recent falls.  Husband does have concerns regarding slight worsening in memory recently such as not remembering that she recently went to the bathroom or conversations that occurred yesterday.  She continues on aspirin without side effects of bleeding or bruising.  Continues on atorvastatin without side effects myalgias.  Blood pressure today satisfactory at 140/60.  They do continue to monitors at home and typically range SBP 130s.  Recommended SBP goal 1 30-1 50 due to intracranial stenosis.  Denies new or worsening stroke/TIA symptoms.   HISTORY SUMMARY:  Brandy Paul is being seen today for initial visit in the office for left MCA hypoperfusion in the setting with left M1 stenosis and decreased left MCA branches on 09/17/2017. History obtained from patient and chart review. Reviewed all radiology images and labs personally.  Brandy Paul is a 73 year old female with history of breast cancer, HLD, CAD, stroke in 2013 with mild right-sided deficit and mild expressive aphasia who was admitted for worsening expressive aphasia episode lasting 15 to 20 minutes. CT head showed chronic left BG right  caudate head and the bilateral cerebellum infarcts. MRI negative for acute stroke. EF 50 to 55%. CTA head and neck showed left M1 stenosis with decreased left MCA branches, right M1/M2 stenosis,which are likely chronic explaining bilateral chronic infarcts. LDL 75 and A1c 6.0. EEG diffuse slowing and no seizure.  On 09/18/2017, patient was found to have worsening expressive aphasia. Repeat head CT no change. Considered hypoperfusion given BP at 120s with left M1 significant stenosis and decreased MCA branches. She received IVF bolus and infusion, albumin Q6h and lying flat over night.  The following morning patient was back to baseline. Orthostatic vital stable.   It was determined that patient's symptoms are likely left MCA hypoperfusion in the setting of relative low blood pressure.  Recommended to keep BP goal 1 30-1 50.  Patient was on aspirin and Plavix PTA and recommended to continue this at discharge.  It was also recommended to increase Lipitor from 20 mg to 40 mg daily.  Patient was discharged home in stable condition.  Patient is being seen today for hospital follow-up and is accompanied by her husband.  She states she has been doing well and her expressive aphasia is back to her baseline.  She continues to participate in home physical therapy as she was evaluated by home OT/ST but was told that she does not need their services.  She is currently sitting in wheelchair for which she uses for long distance but uses up walker for ambulation.  She continues to take aspirin without bleeding or bruising.  Continues to take Lipitor  without side effects myalgias.  Blood pressure today 120/69.  Patient does not monitor this at home besides when physical therapy comes twice a week.  Highly recommended to monitor this as it was recommended to maintain SBP between 130-150.  Husband plans on going out to purchase BP monitor today.  Encourage patient to increase fluid intake and if BP goes less than 110 to drink  water and intake salt and if this does not increase BP then to be seen at that time for stroke prevention due to intracranial stenosis.  As patient states she is constantly fatigued throughout the day, sleeps well at night, and snores it was recommended to undergo sleep study.  Patient declining at this time but was educated on the risk of possible untreated OSA.  Patient may consider this in the future.  She also continues to have right hemiparesis with hand spasticity.  Patient denies spasticity as being debilitating or bothersome and therefore will not do any interventions at this time.  Patient did recently have a fall last weekend and does have ecchymosis on left orbital area.  She was evaluated by her PCP with all negative imaging.  Denies new or worsening stroke/TIA symptoms.    ROS:   14 system review of systems performed and negative with exception of shortness of breath, memory loss and speech difficulty  PMH:  Past Medical History:  Diagnosis Date  . Breast cancer ([place]) 2005   left  . COPD (chronic obstructive pulmonary disease) (H[facility])    hx of tobacco use  . Coronary artery spasm ([place])   . Depression   . GERD (gastroesophageal reflux disease)   . Hyperlipemia   . IDA (iron deficiency anemia)    last iron infusion 4 months ago  . Osteoporosis   . 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 Prediabetes    hx of  . Rash    under right breast and groin line using nizoral ointment to q day per dermatology saw few weeks ago  . Stress incontinence wers depends  . Stroke [facility]) 2013   paralytic syndrome of dominant right side  . Toe fracture, left     PSH:  Past Surgical History:  Procedure Laterality Date  . ABDOMINAL HYSTERECTOMY  1989   complete  . CHOLECYSTECTOMY N/A 12/10/2017   Procedure: LAPAROSCOPIC CHOLECYSTECTOMY WITH INTRAOPERATIVE  ERAS PATHWAY;  Surgeon: Johnathan Hausen, MD;  Location: [facility] ORS;  Service: General;  Laterality: N/A;  . COLONOSCOPY WITH PROPOFOL N/A  12/03/2015   Procedure: COLONOSCOPY WITH PROPOFOL;  Surgeon: Mauri Pole, MD;  Location: [facility] END[facility]COPY;  Service: Endoscopy;  Laterality: N/A;  tba before procdcedure  . IR CHOLANGIOGRAM EXISTING TUBE  08/08/2017  . IR CHOLANGIOGRAM EXISTING TUBE  08/27/2017  . IR PERC CHOLECYSTOSTOMY  07/13/2017  . LAPAROSCOPIC APPENDECTOMY N/A 12/10/2017   Procedure: APPENDECTOMY LAPAROSCOPIC;  Surgeon: Johnathan Hausen, MD;  Location: [facility];  Service: General;  Laterality: N/A;  . MASTECTOMY Left 2005   1 lymph node removed  . RADIAL KERATOTOMY Bilateral   . ROBOTIC ASSISTED BILATERAL SALPINGO OOPHERECTOMY Bilateral 11/04/2015   Procedure: XI ROBOTIC ASSISTED BILATERAL SALPINGO OOPHORECTOMY;  Surgeon: Everitt Amber, MD;  Location: [facility];  Service: Gynecology;  Laterality: Bilateral;  . TONSILLECTOMY      Social History:  Social History   Socioeconomic History  . Marital status: Married    Spouse name: [name]  . Number of children: 4  . Years of education: 85  . Highest education level:  Not on file  Occupational History  . Occupation: retired  Scientific laboratory technician  . Financial resource strain: Not on file  . Food insecurity:    Worry: Not on file    Inability: Not on file  . Transportation needs:    Medical: Not on file    Non-medical: Not on file  Tobacco Use  . Smoking status: Former Smoker    Types: Cigarettes    Last attempt to quit: 02/17/1994    Years since quitting: 24.2  . Smokeless tobacco: Never Used  Substance and Sexual Activity  . Alcohol use: No    Alcohol/week: 0.0 standard drinks  . Drug use: No  . Sexual activity: Never  Lifestyle  . Physical activity:    Days per week: Not on file    Minutes per session: Not on file  . Stress: Not on file  Relationships  . Social connections:    Talks on phone: Not on file    Gets together: Not on file    Attends religious service: Not on file    Active member of club or organization: Not on file    Attends meetings of clubs or  organizations: Not on file    Relationship status: Not on file  . Intimate partner violence:    Fear of current or ex partner: Not on file    Emotionally abused: Not on file    Physically abused: Not on file    Forced sexual activity: Not on file  Other Topics Concern  . Not on file  Social History Narrative   Lives with spouse   1-2 cups coffee    Family History:  Family History  Problem Relation Age of Onset  . CVA Mother   . Liver cancer Mother   . Stroke Maternal Grandfather   . Hypertension Sister   . Arthritis Sister   . Diabetes Sister   . Heart attack Sister     Medications:   Current Outpatient Medications on File Prior to Visit  Medication Sig Dispense Refill  . acetaminophen (TYLENOL) 500 MG tablet Take 500 mg by mouth daily as needed for mild pain.     Marland Kitchen aspirin EC 81 MG tablet Take 81 mg by mouth daily.    Marland Kitchen atorvastatin (LIPITOR) 20 MG tablet TK 1 T PO QD    . buPROPion (WELLBUTRIN SR) 150 MG 12 hr tablet Take 150 mg by mouth every evening.   3  . Calcium Carbonate-Vitamin D (OSCAL 500/200 D-3 PO) Take 1 tablet by mouth every evening.     . clopidogrel (PLAVIX) 75 MG tablet Take 1 tablet (75 mg total) by mouth at bedtime.    Marland Kitchen ezetimibe (ZETIA) 10 MG tablet Take 10 mg by mouth at bedtime.     . isosorbide mononitrate (IMDUR) 60 MG 24 hr tablet TAKE 1 TABLET BY MOUTH DAILY 90 tablet 0  . nitroGLYCERIN (NITROSTAT) 0.4 MG SL tablet Place 1 tablet (0.4 mg total) under the tongue every 5 (five) minutes as needed for chest pain (Call 911 at 3rd dose within 15 minutes). 25 tablet 3  . RABEprazole (ACIPHEX) 20 MG tablet Take 20 mg by mouth at bedtime.   2  . sertraline (ZOLOFT) 100 MG tablet Take 100 mg by mouth at bedtime.   3  . tiotropium (SPIRIVA HANDIHALER) 18 MCG inhalation capsule Place 18 mcg into inhaler and inhale every evening.      No current facility-administered medications on file prior to visit.  Allergies:   Allergies  Allergen Reactions  .  Ceclor [Cefaclor] Other (See Comments)    REACTION: "SEVERE HEADACHE"     Physical Exam  Vitals:   04/29/18 1423  BP: 140/60  Pulse: 78  Weight: 196 lb (88.9 kg)  Height: 5\' 4"  (1.626 m)   Body mass index is 33.64 kg/m. No exam data present  General: Obese pleasant elderly Caucasian female, seated, in no evident distress Head: head normocephalic and atraumatic.   Neck: supple with no carotid or supraclavicular bruits Cardiovascular: regular rate and rhythm, no murmurs Musculoskeletal: no deformity Skin: Intact Vascular:  Normal pulses all extremities  Neurologic Exam Mental Status: Awake and fully alert.  Moderate expressive aphasia (chronic from previous stroke).  Oriented to place and time. Recent and remote memory intact. Attention span, concentration and fund of knowledge appropriate. Mood and affect appropriate.  Cranial Nerves: Fundoscopic exam reveals sharp disc margins. Pupils equal, briskly reactive to light. Extraocular movements full without nystagmus. Visual fields full to confrontation. Hearing intact. Facial sensation intact. Face, tongue, palate moves normally and symmetrically.  Motor: Normal bulk and tone.  RUE: 3/5 with hand spasticity, RLE: 4/5 (chronic from previous stroke); normal strength on left side Sensory.: intact to touch , pinprick , position and vibratory sensation.  Coordination: Rapid alternating movements normal on left side. Finger-to-nose and heel-to-shin performed accurately on left side. Gait and Station: Currently sitting in wheelchair without cane or walker at appointment therefore gait assessment deferred Reflexes: 1+ and symmetric. Toes downgoing.       Diagnostic Data (Labs, Imaging, Testing)  CT head without contrast 09/17/2017 IMPRESSION: 1. No acute finding. 2. Remote infarcts in the bilateral basal ganglia, left insula, and bilateral cerebellum.  MRI brain without contrast 09/18/2017 IMPRESSION: 1. No acute abnormality. 2.  Multiple old infarcts and sequelae of chronic ischemic microangiopathy. 3. Generalized atrophy without lobar predilection.  CT angio neck with and without contrast CT angio head with and without contrast 09/18/2017 IMPRESSION: Atherosclerosis of the aorta and carotid bifurcations but without advanced disease, significant stenosis or significant irregularity. Cervical internal carotid arteries are tortuous, as might be seen with chronic hypertension. Distal vessel intracranial atherosclerotic disease, consistent with medium to small vessel atherosclerosis. The exception to this is focal stenosis at the distal M1 segment on the left, with diminished visualization of the more distal left middle cerebral artery branches. On the right, the M1 segment appears normal caliber but there is diffuse narrowing beginning at the M2 branches.    ASSESSMENT: Brandy Paul is a 73 y.o. year old female here with TIA with left MCA hypoperfusion on 09/17/17 secondary  to left M1 stenosis and decreased left MCA branches. Vascular risk factors include prior stroke, HLD and prediabetes.  She is being seen today for follow-up visit and has recovered well from recent TIA with left MCA hypoperfusion with continued prior stroke deficits of expressive aphasia and RUE weakness/spasticity.   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 I also advised the patient to eat a healthy diet with plenty of whole grains, cereals, fruits and vegetables, exercise regularly and  maintain ideal body weight.  As she is stable from a stroke standpoint, recommend to follow-up as needed or call with questions, concerns or need of future follow-up appointment   Greater than 50% of time during this 25 minute visit was spent on counseling,explanation of diagnosis of TIA, reviewing risk factor management of HLD and intracranial stenosis, planning of further management, discussion with patient and family and coordination of care    Venancio Poisson, [facility]  [facility] [address] [place], [place] [address]  Phone [phone] Fax [phone]

## 2018-04-29 NOTE — Patient Instructions (Signed)
Continue aspirin 81 mg daily and clopidogrel 75 mg daily  and lipitor  for secondary stroke prevention  Continue to follow up with PCP regarding cholesterol and blood pressure management   Continue to stay active and maintain a healthy diet  Continue to monitor blood pressure at home  Maintain strict control of hypertension with blood pressure goal below 130/90, diabetes with hemoglobin A1c goal below 6.5% and cholesterol with LDL cholesterol (bad cholesterol) goal below 70 mg/dL. I also advised the patient to eat a healthy diet with plenty of whole grains, cereals, fruits and vegetables, exercise regularly and maintain ideal body weight.  Followup in the future with me as needed or call earlier if needed       Thank you for coming to see [place] at [facility]. I hope we have been able to provide you high quality care today.  You may receive a patient satisfaction survey over the next few weeks. We would appreciate your feedback and comments so that we may continue to improve ourselves and the health of our patients.

## 2018-05-01 NOTE — Progress Notes (Signed)
I agree with the above plan 

## 2018-07-18 ENCOUNTER — Telehealth: Payer: Self-pay | Admitting: Cardiology

## 2018-07-18 NOTE — Telephone Encounter (Signed)
Spoke with patient's spouse, Brandy Paul (per DPR),who confirmed all demographics. Patient has a smart phone and uses My Chart. Will have vitals ready for visit.

## 2018-07-19 NOTE — Progress Notes (Signed)
Virtual Visit via Video Note   This visit type was conducted due to national recommendations for restrictions regarding the COVID-19 Pandemic (e.g. social distancing) in an effort to limit this patient's exposure and mitigate transmission in our community.  Due to her co-morbid illnesses, this patient is at least at moderate risk for complications without adequate follow up.  This format is felt to be most appropriate for this patient at this time.  All issues noted in this document were discussed and addressed.  A limited physical exam was performed with this format.  Please refer to the patient's chart for her consent to telehealth for [facility].   Date:  07/22/2018   ID:  Brandy Paul, Brandy Paul 1945/11/04, MRN [id number]  Patient Location: Home Provider Location: Home  PCP:  Velna Hatchet, MD  Cardiologist:  Sinclair Grooms, MD   Evaluation Performed:  Follow-Up Visit  Chief Complaint: Follow-up of CAD, coronary spasm, seen for Dr. Tamala Julian  History of Present Illness:    Brandy Paul is a 73 y.o. female with a prior history of CVA with right-sided residual weakness, coronary artery spasm, obesity, DM 2, COPD, GERD and normal myocardial perfusion study performed 10/2015.   She was last seen in the office for preop evaluation on 11/08/2017.  At that time she had recently been hospitalized for acute appendicitis 04/2017 with subsequent cholecystitis 06/2017 in which she had a PERC drain and was placed on antibiotics.  Plans were for cholecystectomy and lap appendectomy after cardiac clearance.  Unfortunately, this was complicated on 03/27/15 after she suffered sudden onset of aphasia with right arm weakness which was determined to be a TIA after imaging.  She was continued on ASA and Plavix at that time per neurology.  At her last follow-up appointment she had no cardiac complaints and was most concerned about proceeding with her surgery.  Today, patient reports her surgery went well.  She denies chest pain or other anginal symptoms. She has not required her PRN SL NTG.  Denies palpitations, shortness of breath, orthopnea, PND or syncope.  She has a few questions about COVID-19 safety precautions that were reviewed.  Otherwise she has no specific complaints today.   The patient does not have symptoms concerning for COVID-19 infection (fever, chills, cough, or new shortness of breath).   Past Medical History:  Diagnosis Date  . Breast cancer ([place]) 2005   left  . COPD (chronic obstructive pulmonary disease) (HCC)    hx of tobacco use  . Coronary artery spasm ([place])   . Depression   . GERD (gastroesophageal reflux disease)   . Hyperlipemia   . IDA (iron deficiency anemia)    last iron infusion 4 months ago  . Osteoporosis   . 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 Prediabetes    hx of  . Rash    under right breast and groin line using nizoral ointment to q day per dermatology saw few weeks ago  . Stress incontinence wers depends  . Stroke [facility]) 2013   paralytic syndrome of dominant right side  . Toe fracture, left    Past Surgical History:  Procedure Laterality Date  . ABDOMINAL HYSTERECTOMY  1989   complete  . CHOLECYSTECTOMY N/A 12/10/2017   Procedure: LAPAROSCOPIC CHOLECYSTECTOMY WITH INTRAOPERATIVE  ERAS PATHWAY;  Surgeon: Johnathan Hausen, MD;  Location: [facility];  Service: General;  Laterality: N/A;  . COLONOSCOPY WITH PROPOFOL N/A 12/03/2015   Procedure: COLONOSCOPY WITH PROPOFOL;  Surgeon:  Mauri Pole, MD;  Location: [facility];  Service: Endoscopy;  Laterality: N/A;  tba before procdcedure  . IR CHOLANGIOGRAM EXISTING TUBE  08/08/2017  . IR CHOLANGIOGRAM EXISTING TUBE  08/27/2017  . IR PERC CHOLECYSTOSTOMY  07/13/2017  . LAPAROSCOPIC APPENDECTOMY N/A 12/10/2017   Procedure: APPENDECTOMY LAPAROSCOPIC;  Surgeon: Johnathan Hausen, MD;  Location: [facility];  Service: General;  Laterality: N/A;  . MASTECTOMY Left 2005   1 lymph node removed  . RADIAL  KERATOTOMY Bilateral   . ROBOTIC ASSISTED BILATERAL SALPINGO OOPHERECTOMY Bilateral 11/04/2015   Procedure: XI ROBOTIC ASSISTED BILATERAL SALPINGO OOPHORECTOMY;  Surgeon: Everitt Amber, MD;  [facility]: [facility];  Service: Gynecology;  Laterality: Bilateral;  . TONSILLECTOMY       Current Meds  Medication Sig  . acetaminophen (TYLENOL) 500 MG tablet Take 500 mg by mouth daily as needed for mild pain.   Marland Kitchen aspirin EC 81 MG tablet Take 81 mg by mouth daily.  Marland Kitchen atorvastatin (LIPITOR) 40 MG tablet TK 1 T PO D  . buPROPion (WELLBUTRIN SR) 150 MG 12 hr tablet Take 150 mg by mouth every evening.   . calcium carbonate (OSCAL) 1500 (600 Ca) MG TABS tablet Take 600 mg of elemental calcium by mouth daily.  . clopidogrel (PLAVIX) 75 MG tablet Take 1 tablet (75 mg total) by mouth at bedtime.  Marland Kitchen ezetimibe (ZETIA) 10 MG tablet Take 10 mg by mouth at bedtime.   . isosorbide mononitrate (IMDUR) 60 MG 24 hr tablet TAKE 1 TABLET BY MOUTH DAILY  . nitroGLYCERIN (NITROSTAT) 0.4 MG SL tablet Place 1 tablet (0.4 mg total) under the tongue every 5 (five) minutes as needed for chest pain (Call 911 at 3rd dose within 15 minutes).  . RABEprazole (ACIPHEX) 20 MG tablet Take 20 mg by mouth at bedtime.   . sertraline (ZOLOFT) 100 MG tablet Take 100 mg by mouth at bedtime.   Marland Kitchen tiotropium (SPIRIVA HANDIHALER) 18 MCG inhalation capsule Place 18 mcg into inhaler and inhale every evening.      Allergies:   Ceclor [cefaclor]   Social History   Tobacco Use  . Smoking status: Former Smoker    Types: Cigarettes    Last attempt to quit: 02/17/1994    Years since quitting: 24.4  . Smokeless tobacco: Never Used  Substance Use Topics  . Alcohol use: No    Alcohol/week: 0.0 standard drinks  . Drug use: No    Family Hx: The patient's family history includes Arthritis in her sister; CVA in her mother; Diabetes in her sister; Heart attack in her sister; Hypertension in her sister; Liver cancer in her mother; Stroke in her maternal  grandfather.  ROS:   Please see the history of present illness.     All other systems reviewed and are negative.  Prior CV studies:   The following studies were reviewed today:  Echo 09/18/17:  - Left ventricle: The cavity size was normal. Wall thickness was normal. Systolic function was normal. The estimated ejection fraction was in the range of 50% to 55%. Wall motion was normal; there were no regional wall motion abnormalities. Doppler parameters are consistent with abnormal left ventricular relaxation (grade 1 diastolic dysfunction). - Mitral valve: Calcified annulus. There was mild regurgitation. - Tricuspid valve: There was mild-moderate regurgitation.  Impressions:  - Normal LV systolic function; mild distolic dysfunction; mild MR; negative saline microcavitation study.  The prior echo was from 2012 and mild TR.  Labs/Other Tests and Data Reviewed:    EKG:  An ECG dated 09/17/2017 was personally reviewed today and demonstrated:  Normal sinus rhythm  Recent Labs: 09/18/2017: TSH 1.815 02/25/2018: ALT 17; BUN 15; Creatinine, Ser 1.05; Hemoglobin 8.0; Platelets 240; Potassium 3.9; Sodium 142   Recent Lipid Panel Lab Results  Component Value Date/Time   CHOL 139 09/18/2017 05:33 AM   TRIG 150 (H) 09/18/2017 05:33 AM   HDL 34 (L) 09/18/2017 05:33 AM   CHOLHDL 4.1 09/18/2017 05:33 AM   LDLCALC 75 09/18/2017 05:33 AM   Wt Readings from Last 3 Encounters:  07/22/18 188 lb 12.8 oz (85.6 kg)  04/29/18 196 lb (88.9 kg)  12/10/17 203 lb (92.1 kg)    Objective:    Vital Signs:  BP 134/72   Pulse 78   Ht 5\' 3"  (1.6 m)   Wt 188 lb 12.8 oz (85.6 kg)   BMI 33.44 kg/m    VITAL SIGNS:  reviewed GEN:  no acute distress EYES:  sclerae anicteric, EOMI - Extraocular Movements Intact RESPIRATORY:  normal respiratory effort, symmetric expansion SKIN:  no rash, lesions or ulcers. MUSCULOSKELETAL:  no obvious deformities. NEURO:  alert and oriented x 3, no obvious  focal deficit PSYCH: flat affect  ASSESSMENT & PLAN:    1. History of coronary spasm: -Denies recurrent symptoms -Has Rx for PRN SL NTG without recent need -Continue ASA 81, Plavix 75, Imdur 60 -BP stable at 134/72  2.  History of TIA: -Has reported right sided residual weakness -Flat affect -Follows with neurology -Continue Plavix, ASA 81  3.  HLD: -Last LDL, 75 -Triglycerides, 150 -Continue atorvastatin, Zetia -Will obtain lipid panel at next office visit given COVID-19 and no urgent need for lab work at this current time  COVID-19 Education: The signs and symptoms of COVID-19 were discussed with the patient and how to seek care for testing (follow up with PCP or arrange E-visit).  The importance of social distancing was discussed today.  Time:   Today, I have spent 15 minutes with the patient with telehealth technology discussing the above problems.     Medication Adjustments/Labs and Tests Ordered: Current medicines are reviewed at length with the patient today.  Concerns regarding medicines are outlined above.   Tests Ordered: No orders of the defined types were placed in this encounter.   Medication Changes: No orders of the defined types were placed in this encounter.   Disposition:  Follow up in 6 month(s) with Dr. Tamala Julian or sooner if needed  Signed, Kathyrn Drown, NP  07/22/2018 9:39 AM    [place]

## 2018-07-20 ENCOUNTER — Other Ambulatory Visit: Payer: Self-pay | Admitting: Interventional Cardiology

## 2018-07-22 ENCOUNTER — Other Ambulatory Visit: Payer: Self-pay

## 2018-07-22 ENCOUNTER — Telehealth (INDEPENDENT_AMBULATORY_CARE_PROVIDER_SITE_OTHER): Payer: Medicare Other | Admitting: Cardiology

## 2018-07-22 ENCOUNTER — Encounter: Payer: Self-pay | Admitting: Cardiology

## 2018-07-22 VITALS — BP 134/72 | HR 78 | Ht 63.0 in | Wt 188.8 lb

## 2018-07-22 DIAGNOSIS — E7849 Other hyperlipidemia: Secondary | ICD-10-CM

## 2018-07-22 DIAGNOSIS — Z8673 Personal history of transient ischemic attack (TIA), and cerebral infarction without residual deficits: Secondary | ICD-10-CM

## 2018-07-22 DIAGNOSIS — I201 Angina pectoris with documented spasm: Secondary | ICD-10-CM | POA: Diagnosis not present

## 2018-07-22 NOTE — Patient Instructions (Signed)
Medication Instructions:  Your physician recommends that you continue on your current medications as directed. Please refer to the Current Medication list given to you today.  If you need a refill on your cardiac medications before your next appointment, please call your pharmacy.   Lab work: None ordered  If you have labs (blood work) drawn today and your tests are completely normal, you will receive your results only by: Marland Kitchen MyChart Message (if you have MyChart) OR . A paper copy in the mail If you have any lab test that is abnormal or we need to change your treatment, we will call you to review the results.  Testing/Procedures: None ordered   Follow-Up: At [facility], you and your health needs are our priority.  As part of our continuing mission to provide you with exceptional heart care, we have created designated Provider Care Teams.  These Care Teams include your primary Cardiologist (physician) and Advanced Practice Providers (APPs -  Physician Assistants and Nurse Practitioners) who all work together to provide you with the care you need, when you need it. You will need a follow up appointment in 6 months.  Please call our office 2 months in dvance to schedule this appointment.  You may see Sinclair Grooms, MD or one of the following Advanced Practice Providers on your designated Care Team:   Truitt Merle, NP Cecilie Kicks, NP . Kathyrn Drown, NP  Any Other Special Instructions Will Be Listed Below (If Applicable).

## 2018-09-19 DIAGNOSIS — E119 Type 2 diabetes mellitus without complications: Secondary | ICD-10-CM | POA: Diagnosis not present

## 2018-09-19 DIAGNOSIS — K219 Gastro-esophageal reflux disease without esophagitis: Secondary | ICD-10-CM | POA: Diagnosis not present

## 2018-09-19 DIAGNOSIS — D692 Other nonthrombocytopenic purpura: Secondary | ICD-10-CM | POA: Diagnosis not present

## 2018-09-19 DIAGNOSIS — G459 Transient cerebral ischemic attack, unspecified: Secondary | ICD-10-CM | POA: Diagnosis not present

## 2018-09-19 DIAGNOSIS — Z1331 Encounter for screening for depression: Secondary | ICD-10-CM | POA: Diagnosis not present

## 2018-09-19 DIAGNOSIS — M810 Age-related osteoporosis without current pathological fracture: Secondary | ICD-10-CM | POA: Diagnosis not present

## 2018-09-19 DIAGNOSIS — F325 Major depressive disorder, single episode, in full remission: Secondary | ICD-10-CM | POA: Diagnosis not present

## 2018-09-19 DIAGNOSIS — J449 Chronic obstructive pulmonary disease, unspecified: Secondary | ICD-10-CM | POA: Diagnosis not present

## 2018-09-19 DIAGNOSIS — D649 Anemia, unspecified: Secondary | ICD-10-CM | POA: Diagnosis not present

## 2018-09-19 DIAGNOSIS — E785 Hyperlipidemia, unspecified: Secondary | ICD-10-CM | POA: Diagnosis not present

## 2018-09-30 DIAGNOSIS — E119 Type 2 diabetes mellitus without complications: Secondary | ICD-10-CM | POA: Diagnosis not present

## 2018-09-30 DIAGNOSIS — E7849 Other hyperlipidemia: Secondary | ICD-10-CM | POA: Diagnosis not present

## 2018-11-25 ENCOUNTER — Encounter: Payer: Self-pay | Admitting: Gastroenterology

## 2018-12-21 DIAGNOSIS — Z23 Encounter for immunization: Secondary | ICD-10-CM | POA: Diagnosis not present

## 2019-01-03 ENCOUNTER — Encounter: Payer: Self-pay | Admitting: Gastroenterology

## 2019-01-07 DIAGNOSIS — H35359 Cystoid macular degeneration, unspecified eye: Secondary | ICD-10-CM | POA: Diagnosis not present

## 2019-02-07 ENCOUNTER — Ambulatory Visit (INDEPENDENT_AMBULATORY_CARE_PROVIDER_SITE_OTHER): Payer: Medicare Other | Admitting: Gastroenterology

## 2019-02-07 ENCOUNTER — Encounter: Payer: Self-pay | Admitting: Gastroenterology

## 2019-02-07 VITALS — BP 115/70 | HR 84 | Temp 98.1°F | Ht 63.0 in | Wt 194.0 lb

## 2019-02-07 DIAGNOSIS — K649 Unspecified hemorrhoids: Secondary | ICD-10-CM

## 2019-02-07 DIAGNOSIS — I201 Angina pectoris with documented spasm: Secondary | ICD-10-CM | POA: Diagnosis not present

## 2019-02-07 DIAGNOSIS — Z86010 Personal history of colonic polyps: Secondary | ICD-10-CM | POA: Diagnosis not present

## 2019-02-07 DIAGNOSIS — K5904 Chronic idiopathic constipation: Secondary | ICD-10-CM | POA: Diagnosis not present

## 2019-02-07 DIAGNOSIS — K625 Hemorrhage of anus and rectum: Secondary | ICD-10-CM | POA: Diagnosis not present

## 2019-02-07 MED ORDER — HYDROCORTISONE ACETATE 25 MG RE SUPP: 25.0000 mg | Freq: Two times a day (BID) | RECTAL | 1 refills | Status: DC | PRN

## 2019-02-07 NOTE — Patient Instructions (Signed)
We will send you a reminder letter to schedule your hospital  colonoscopy in June 2021  We have sent Anusol suppositories to your pharmacy  Take Fiberchoice 1 tablet daily  Increase water/Fluid  Intake daily  Follow up in 6 months   If you are age 74 or older, your body mass index should be between 23-30. Your Body mass index is 34.37 kg/m. If this is out of the aforementioned range listed, please consider follow up with your Primary Care Provider.  If you are age 8 or younger, your body mass index should be between 19-25. Your Body mass index is 34.37 kg/m. If this is out of the aformentioned range listed, please consider follow up with your Primary Care Provider.    I appreciate the  opportunity to care for you  Thank You   Harl Bowie , MD

## 2019-02-07 NOTE — Progress Notes (Signed)
Brandy Paul    ON:[id number]    Oct 15, 1945  Primary Care Physician:Velna Hatchet, MD  Referring Physician: Velna Hatchet, [place],  [place] [address]   Chief complaint: History of colon polyps HPI:  73 year old female with history of stroke, residual right-sided weakness, wheelchair-bound is here accompanied by her husband to discuss surveillance colonoscopy. Colonoscopy December 03, 2015 for positive Cologuard: 15 mm polyp removed from descending colon with hot snare along with diminutive polyp removed from the transverse colon [tubular adenomas].  Diverticulosis and internal hemorrhoids.  She is having intermittent rectal bleeding specially worse than she is impacted with hard stool, her husband has to manually disimpact.  Somewhat better since he started giving her stool softener did not need any manual disimpaction.  Denies any vomiting, decreased appetite, weight loss or abdominal pain.  No family history of colon cancer.  Outpatient Encounter Medications as of 02/07/2019  Medication Sig  . acetaminophen (TYLENOL) 500 MG tablet Take 500 mg by mouth daily as needed for mild pain.   Marland Kitchen aspirin EC 81 MG tablet Take 81 mg by mouth daily.  Marland Kitchen atorvastatin (LIPITOR) 40 MG tablet TK 1 T PO D  . buPROPion (WELLBUTRIN SR) 150 MG 12 hr tablet Take 150 mg by mouth every evening.   . calcium carbonate (OSCAL) 1500 (600 Ca) MG TABS tablet Take 600 mg of elemental calcium by mouth daily.  . clopidogrel (PLAVIX) 75 MG tablet Take 1 tablet (75 mg total) by mouth at bedtime.  Marland Kitchen ezetimibe (ZETIA) 10 MG tablet Take 10 mg by mouth at bedtime.   . isosorbide mononitrate (IMDUR) 60 MG 24 hr tablet TAKE 1 TABLET BY MOUTH EVERY DAY  . nitroGLYCERIN (NITROSTAT) 0.4 MG SL tablet Place 1 tablet (0.4 mg total) under the tongue every 5 (five) minutes as needed for chest pain (Call 911 at 3rd dose within 15 minutes).  . RABEprazole (ACIPHEX) 20 MG tablet Take 20 mg by  mouth at bedtime.   . sertraline (ZOLOFT) 100 MG tablet Take 100 mg by mouth at bedtime.   Marland Kitchen tiotropium (SPIRIVA HANDIHALER) 18 MCG inhalation capsule Place 18 mcg into inhaler and inhale every evening.    No facility-administered encounter medications on file as of 02/07/2019.     Allergies as of 02/07/2019 - Review Complete 02/07/2019  Allergen Reaction Noted  . Ceclor [cefaclor] Other (See Comments) 03/10/2011    Past Medical History:  Diagnosis Date  . Adenomatous colon polyp   . Breast cancer ([place]) 2005   left  . COPD (chronic obstructive pulmonary disease) (HCC)    hx of tobacco use  . Coronary artery spasm ([place])   . Depression   . GERD (gastroesophageal reflux disease)   . Hyperlipemia   . IDA (iron deficiency anemia)    last iron infusion 4 months ago  . Obesity   . Osteoporosis   . 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 Prediabetes    hx of  . Rash    under right breast and groin line using nizoral ointment to q day per dermatology saw few weeks ago  . Stress incontinence wers depends  . Stroke [facility]) 2013   paralytic syndrome of dominant right side  . Toe fracture, left     Past Surgical History:  Procedure Laterality Date  . ABDOMINAL HYSTERECTOMY  1989   complete  . CHOLECYSTECTOMY N/A 12/10/2017   Procedure: LAPAROSCOPIC CHOLECYSTECTOMY WITH INTRAOPERATIVE  ERAS PATHWAY;  Surgeon: Johnathan Hausen, MD;  [facility];  Service: General;  Laterality: N/A;  . COLONOSCOPY WITH PROPOFOL N/A 12/03/2015   Procedure: COLONOSCOPY WITH PROPOFOL;  Surgeon: Mauri Pole, MD;  [facility];  Service: Endoscopy;  Laterality: N/A;  tba before procdcedure  . IR CHOLANGIOGRAM EXISTING TUBE  08/08/2017  . IR CHOLANGIOGRAM EXISTING TUBE  08/27/2017  . IR PERC CHOLECYSTOSTOMY  07/13/2017  . LAPAROSCOPIC APPENDECTOMY N/A 12/10/2017   Procedure: APPENDECTOMY LAPAROSCOPIC;  Surgeon: Johnathan Hausen, MD;  [facility];  Service: General;  Laterality: N/A;   . MASTECTOMY Left 2005   1 lymph node removed  . RADIAL KERATOTOMY Bilateral   . ROBOTIC ASSISTED BILATERAL SALPINGO OOPHERECTOMY Bilateral 11/04/2015   Procedure: XI ROBOTIC ASSISTED BILATERAL SALPINGO OOPHORECTOMY;  Surgeon: Everitt Amber, MD;  [facility];  Service: Gynecology;  Laterality: Bilateral;  . TONSILLECTOMY      Family History  Problem Relation Age of Onset  . CVA Mother   . Liver cancer Mother   . Prostate cancer Father   . Stroke Maternal Grandfather   . Hypertension Sister   . Arthritis Sister   . Diabetes Sister   . Heart attack Sister   . Breast cancer Sister   . Colon polyps Sister   . Colon cancer Neg Hx   . Esophageal cancer Neg Hx   . Rectal cancer Neg Hx     Social History   Socioeconomic History  . Marital status: Married    Spouse name: [name]  . Number of children: 4  . Years of education: 56  . Highest education level: Not on file  Occupational History  . Occupation: retired  Scientific laboratory technician  . Financial resource strain: Not on file  . Food insecurity    Worry: Not on file    Inability: Not on file  . Transportation needs    Medical: Not on file    Non-medical: Not on file  Tobacco Use  . Smoking status: Former Smoker    Types: Cigarettes    Quit date: 02/17/1994    Years since quitting: 24.9  . Smokeless tobacco: Never Used  Substance and Sexual Activity  . Alcohol use: No    Alcohol/week: 0.0 standard drinks  . Drug use: No  . Sexual activity: Not Currently  Lifestyle  . Physical activity    Days per week: Not on file    Minutes per session: Not on file  . Stress: Not on file  Relationships  . Social Herbalist on phone: Not on file    Gets together: Not on file    Attends religious service: Not on file    Active member of club or organization: Not on file    Attends meetings of clubs or organizations: Not on file    Relationship status: Not on file  . Intimate partner violence    Fear of current or ex partner:  Not on file    Emotionally abused: Not on file    Physically abused: Not on file    Forced sexual activity: Not on file  Other Topics Concern  . Not on file  Social History Narrative   Lives with spouse   1-2 cups coffee      Review of systems: Review of Systems  Constitutional: Negative for fever and chills. Positive for fatigue HENT: Negative.   Eyes: Negative for blurred vision.  Respiratory: Negative for cough and wheezing.   Cardiovascular: Negative for  chest pain and palpitations.  Gastrointestinal: as per HPI Genitourinary: Negative for dysuria, urgency, frequency and hematuria.  Musculoskeletal: Negative for myalgias, back pain and joint pain.  Skin: Negative for itching and rash.  Neurological: Negative for dizziness, tremors, focal weakness, seizures and loss of consciousness.  Endo/Heme/Allergies: Positive for seasonal allergies.  Psychiatric/Behavioral: Negative for depression, suicidal ideas and hallucinations.  All other systems reviewed and are negative.   Physical Exam: Vitals:   02/07/19 1341  BP: 115/70  Pulse: 84  Temp: 98.1 F (36.7 C)   Body mass index is 34.37 kg/m. Gen:      No acute distress HEENT:  EOMI, sclera anicteric Neck:     No masses; no thyromegaly Lungs:    Clear to auscultation bilaterally; normal respiratory effort CV:         Regular rate and rhythm; no murmurs Abd:      + bowel sounds; soft, non-tender; no palpable masses, no distension Ext:    No edema; adequate peripheral perfusion Skin:      Warm and dry; no rash Neuro: alert and oriented x 3 Psych: normal mood and affect  Data Reviewed:  Reviewed labs, radiology imaging, old records and pertinent past GI work up   Assessment and Plan/Recommendations:  73 year old female with history of COPD, hyperlipidemia, stroke with residual right-sided weakness on Plavix, wheelchair-bound, history of advanced adenomatous colon polyps here to discuss surveillance colonoscopy   Given her comorbidities and right-sided residual weakness after stroke, patient will need inpatient admission to complete the bowel prep and also will need to schedule colonoscopy at the hospital. We will have to delay the procedure for 6 months due to current pandemic with COVID-19. Discussed with patient the potential risks and benefits, patient and husband agree with proceeding with the procedure.  We will try to tentatively schedule it around June 2021 We will need to get clearance from PMD to hold Plavix prior to the procedure for 5 days  Rectal bleeding likely secondary to hemorrhage from internal hemorrhoids Anusol suppository twice daily as needed Advised patient to avoid using it continuously for greater than 7 days at a time Increase dietary fiber and fluid intake Start Fiberchoice 1 tablet daily  Return in 6 months or sooner if needed  25 minutes was spent face-to-face with the patient. Greater than 50% of the time used for counseling as well as treatment plan and follow-up. She had multiple questions which were answered to her satisfaction  K. Denzil Magnuson , MD    CC: Velna Hatchet, MD

## 2019-02-12 ENCOUNTER — Encounter: Payer: Self-pay | Admitting: Gastroenterology

## 2019-02-28 DIAGNOSIS — E7849 Other hyperlipidemia: Secondary | ICD-10-CM | POA: Diagnosis not present

## 2019-02-28 DIAGNOSIS — D509 Iron deficiency anemia, unspecified: Secondary | ICD-10-CM | POA: Diagnosis not present

## 2019-02-28 DIAGNOSIS — E119 Type 2 diabetes mellitus without complications: Secondary | ICD-10-CM | POA: Diagnosis not present

## 2019-02-28 DIAGNOSIS — M810 Age-related osteoporosis without current pathological fracture: Secondary | ICD-10-CM | POA: Diagnosis not present

## 2019-03-04 DIAGNOSIS — E7849 Other hyperlipidemia: Secondary | ICD-10-CM | POA: Diagnosis not present

## 2019-03-12 DIAGNOSIS — Z1212 Encounter for screening for malignant neoplasm of rectum: Secondary | ICD-10-CM | POA: Diagnosis not present

## 2019-03-31 NOTE — Progress Notes (Signed)
Cardiology Office Note:    Date:  04/01/2019   ID:  Brandy Paul, DOB 09/17/1945, MRN [id number]  PCP:  Velna Hatchet, MD  Cardiologist:  Sinclair Grooms, MD   Referring MD: Velna Hatchet, MD   Chief Complaint  Patient presents with  . Chest Pain    History of Present Illness:    Brandy Paul is a 74 y.o. female with a hx of prior history of CVA with right-sided residual weakness, coronary artery spasm, obesity, DM 2, COPD, GERD and normal myocardial perfusion study performed 10/2015.   The patient has had no new neurological concerns.  She denies angina.  No nitroglycerin use.  She is compliant with her current regimen.  They are concerned about when she will be able to get the vaccine.  Past Medical History:  Diagnosis Date  . Adenomatous colon polyp   . Breast cancer ([place]) 2005   left  . COPD (chronic obstructive pulmonary disease) (HCC)    hx of tobacco use  . Coronary artery spasm ([place])   . Depression   . GERD (gastroesophageal reflux disease)   . Hyperlipemia   . IDA (iron deficiency anemia)    last iron infusion 4 months ago  . Obesity   . Osteoporosis   . 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 Prediabetes    hx of  . Rash    under right breast and groin line using nizoral ointment to q day per dermatology saw few weeks ago  . Stress incontinence wers depends  . Stroke [facility]) 2013   paralytic syndrome of dominant right side  . Toe fracture, left     Past Surgical History:  Procedure Laterality Date  . ABDOMINAL HYSTERECTOMY  1989   complete  . CHOLECYSTECTOMY N/A 12/10/2017   Procedure: LAPAROSCOPIC CHOLECYSTECTOMY WITH INTRAOPERATIVE  ERAS PATHWAY;  Surgeon: Johnathan Hausen, MD;  [facility];  Service: General;  Laterality: N/A;  . COLONOSCOPY WITH PROPOFOL N/A 12/03/2015   Procedure: COLONOSCOPY WITH PROPOFOL;  Surgeon: Mauri Pole, MD;  [facility];  Service: Endoscopy;  Laterality: N/A;  tba before procdcedure  . IR  CHOLANGIOGRAM EXISTING TUBE  08/08/2017  . IR CHOLANGIOGRAM EXISTING TUBE  08/27/2017  . IR PERC CHOLECYSTOSTOMY  07/13/2017  . LAPAROSCOPIC APPENDECTOMY N/A 12/10/2017   Procedure: APPENDECTOMY LAPAROSCOPIC;  Surgeon: Johnathan Hausen, MD;  [facility];  Service: General;  Laterality: N/A;  . MASTECTOMY Left 2005   1 lymph node removed  . RADIAL KERATOTOMY Bilateral   . ROBOTIC ASSISTED BILATERAL SALPINGO OOPHERECTOMY Bilateral 11/04/2015   Procedure: XI ROBOTIC ASSISTED BILATERAL SALPINGO OOPHORECTOMY;  Surgeon: Everitt Amber, MD;  [facility];  Service: Gynecology;  Laterality: Bilateral;  . TONSILLECTOMY      Current Medications: Current Meds  Medication Sig  . acetaminophen (TYLENOL) 500 MG tablet Take 500 mg by mouth daily as needed for mild pain.   Marland Kitchen aspirin EC 81 MG tablet Take 81 mg by mouth daily.  Marland Kitchen atorvastatin (LIPITOR) 40 MG tablet TK 1 T PO D  . buPROPion (WELLBUTRIN SR) 150 MG 12 hr tablet Take 150 mg by mouth every evening.   . calcium carbonate (OSCAL) 1500 (600 Ca) MG TABS tablet Take 600 mg of elemental calcium by mouth daily.  . clopidogrel (PLAVIX) 75 MG tablet Take 1 tablet (75 mg total) by mouth at bedtime.  Marland Kitchen ezetimibe (ZETIA) 10 MG tablet Take 10 mg by mouth at bedtime.   . hydrocortisone (  ANUSOL-HC) 25 MG suppository Place 1 suppository (25 mg total) rectally 2 (two) times daily as needed for hemorrhoids or anal itching. Avoid using more than 7 days at a time  . isosorbide mononitrate (IMDUR) 60 MG 24 hr tablet TAKE 1 TABLET BY MOUTH EVERY DAY  . nitroGLYCERIN (NITROSTAT) 0.4 MG SL tablet Place 1 tablet (0.4 mg total) under the tongue every 5 (five) minutes as needed for chest pain (Call 911 at 3rd dose within 15 minutes).  . RABEprazole (ACIPHEX) 20 MG tablet Take 20 mg by mouth at bedtime.   . sertraline (ZOLOFT) 100 MG tablet Take 100 mg by mouth at bedtime.   Marland Kitchen tiotropium (SPIRIVA HANDIHALER) 18 MCG inhalation capsule Place 18 mcg into inhaler and inhale  every evening.      Allergies:   Ceclor [cefaclor]   Social History   Socioeconomic History  . Marital status: Married    Spouse name: [name]  . Number of children: 4  . Years of education: 48  . Highest education level: Not on file  Occupational History  . Occupation: retired  Tobacco Use  . Smoking status: Former Smoker    Types: Cigarettes    Quit date: 02/17/1994    Years since quitting: 25.1  . Smokeless tobacco: Never Used  Substance and Sexual Activity  . Alcohol use: No    Alcohol/week: 0.0 standard drinks  . Drug use: No  . Sexual activity: Not Currently  Other Topics Concern  . Not on file  Social History Narrative   Lives with spouse   1-2 cups coffee   Social Determinants of Health   Financial Resource Strain:   . Difficulty of Paying Living Expenses: Not on file  Food Insecurity:   . Worried About Charity fundraiser in the Last Year: Not on file  . Ran Out of Food in the Last Year: Not on file  Transportation Needs:   . Lack of Transportation (Medical): Not on file  . Lack of Transportation (Non-Medical): Not on file  Physical Activity:   . Days of Exercise per Week: Not on file  . Minutes of Exercise per Session: Not on file  Stress:   . Feeling of Stress : Not on file  Social Connections:   . Frequency of Communication with Friends and Family: Not on file  . Frequency of Social Gatherings with Friends and Family: Not on file  . Attends Religious Services: Not on file  . Active Member of Clubs or Organizations: Not on file  . Attends Archivist Meetings: Not on file  . Marital Status: Not on file     Family History: The patient's family history includes Arthritis in her sister; Breast cancer in her sister; CVA in her mother; Colon polyps in her sister; Diabetes in her sister; Heart attack in her sister; Hypertension in her sister; Liver cancer in her mother; Prostate cancer in her father; Stroke in her maternal grandfather. There is no  history of Colon cancer, Esophageal cancer, or Rectal cancer.  ROS:   Please see the history of present illness.    No complaints all other systems reviewed and are negative.  EKGs/Labs/Other Studies Reviewed:    The following studies were reviewed today: No new data  EKG:  EKG sinus rhythm with nonspecific T wave flattening inferior and anterolateral leads.  Recent Labs: No results found for requested labs within last 8760 hours.  Recent Lipid Panel    Component Value Date/Time   CHOL 139 09/18/2017  0533   TRIG 150 (H) 09/18/2017 0533   HDL 34 (L) 09/18/2017 0533   CHOLHDL 4.1 09/18/2017 0533   VLDL 30 09/18/2017 0533   LDLCALC 75 09/18/2017 0533    Physical Exam:    VS:  BP 124/66   Pulse 64   Ht 5\' 3"  (1.6 m)   Wt 194 lb 12.8 oz (88.4 kg)   SpO2 93%   BMI 34.51 kg/m     Wt Readings from Last 3 Encounters:  04/01/19 194 lb 12.8 oz (88.4 kg)  02/07/19 194 lb (88 kg)  07/22/18 188 lb 12.8 oz (85.6 kg)     GEN: Moderate obesity. No acute distress HEENT: Normal NECK: No JVD. LYMPHATICS: No lymphadenopathy CARDIAC:  RRR without murmur, gallop, or edema. VASCULAR:  Normal Pulses. No bruits. RESPIRATORY:  Clear to auscultation without rales, wheezing or rhonchi  ABDOMEN: Soft, non-tender, non-distended, No pulsatile mass, MUSCULOSKELETAL: No deformity  SKIN: Warm and dry NEUROLOGIC:  Alert and oriented x 3 PSYCHIATRIC:  Normal affect   ASSESSMENT:    1. Coronary artery spasm (HCC)   2. Other hyperlipidemia   3. Hx of TIA (transient ischemic attack) and stroke   4. DOE (dyspnea on exertion)   5. DM (diabetes mellitus), type 2, uncontrolled with complications ([place])   6. Educated about COVID-19 virus infection    PLAN:    In order of problems listed above:  1. Nitroglycerin if recurrent episodes of chest pain 2. LDL target less than 70.  Most recent evaluation in December was 72.  Continue current therapy. 3. Continue aspirin and Plavix. 4. No complaint  of dyspnea. 5. A1c should be less than 7.  Most recent was 5.7 in December. 6. 3W's and COVID-19 vaccination are being practiced and endorsed respectively.   Medication Adjustments/Labs and Tests Ordered: Current medicines are reviewed at length with the patient today.  Concerns regarding medicines are outlined above.  Orders Placed This Encounter  Procedures  . EKG 12-Lead   No orders of the defined types were placed in this encounter.   There are no Patient Instructions on file for this visit.   Signed, Sinclair Grooms, MD  04/01/2019 2:09 PM    [facility]

## 2019-04-01 ENCOUNTER — Ambulatory Visit (INDEPENDENT_AMBULATORY_CARE_PROVIDER_SITE_OTHER): Payer: Medicare Other | Admitting: Interventional Cardiology

## 2019-04-01 ENCOUNTER — Encounter: Payer: Self-pay | Admitting: Interventional Cardiology

## 2019-04-01 ENCOUNTER — Other Ambulatory Visit: Payer: Self-pay

## 2019-04-01 VITALS — BP 124/66 | HR 64 | Ht 63.0 in | Wt 194.8 lb

## 2019-04-01 DIAGNOSIS — E1165 Type 2 diabetes mellitus with hyperglycemia: Secondary | ICD-10-CM | POA: Diagnosis not present

## 2019-04-01 DIAGNOSIS — Z7189 Other specified counseling: Secondary | ICD-10-CM

## 2019-04-01 DIAGNOSIS — E7849 Other hyperlipidemia: Secondary | ICD-10-CM | POA: Diagnosis not present

## 2019-04-01 DIAGNOSIS — R0600 Dyspnea, unspecified: Secondary | ICD-10-CM

## 2019-04-01 DIAGNOSIS — Z8673 Personal history of transient ischemic attack (TIA), and cerebral infarction without residual deficits: Secondary | ICD-10-CM | POA: Diagnosis not present

## 2019-04-01 DIAGNOSIS — E118 Type 2 diabetes mellitus with unspecified complications: Secondary | ICD-10-CM | POA: Diagnosis not present

## 2019-04-01 DIAGNOSIS — R0609 Other forms of dyspnea: Secondary | ICD-10-CM

## 2019-04-01 DIAGNOSIS — IMO0002 Reserved for concepts with insufficient information to code with codable children: Secondary | ICD-10-CM

## 2019-04-01 DIAGNOSIS — I201 Angina pectoris with documented spasm: Secondary | ICD-10-CM | POA: Diagnosis not present

## 2019-04-01 NOTE — Patient Instructions (Signed)

## 2019-04-18 DIAGNOSIS — E1169 Type 2 diabetes mellitus with other specified complication: Secondary | ICD-10-CM | POA: Diagnosis not present

## 2019-05-19 DIAGNOSIS — H16123 Filamentary keratitis, bilateral: Secondary | ICD-10-CM | POA: Diagnosis not present

## 2019-05-20 DIAGNOSIS — Z23 Encounter for immunization: Secondary | ICD-10-CM | POA: Diagnosis not present

## 2019-05-26 DIAGNOSIS — H02035 Senile entropion of left lower eyelid: Secondary | ICD-10-CM | POA: Diagnosis not present

## 2019-05-26 DIAGNOSIS — H16123 Filamentary keratitis, bilateral: Secondary | ICD-10-CM | POA: Diagnosis not present

## 2019-05-26 DIAGNOSIS — H02045 Spastic entropion of left lower eyelid: Secondary | ICD-10-CM | POA: Diagnosis not present

## 2019-05-30 DIAGNOSIS — H02006 Unspecified entropion of left eye, unspecified eyelid: Secondary | ICD-10-CM | POA: Diagnosis not present

## 2019-05-30 DIAGNOSIS — Z01818 Encounter for other preprocedural examination: Secondary | ICD-10-CM | POA: Diagnosis not present

## 2019-06-02 ENCOUNTER — Telehealth: Payer: Self-pay | Admitting: *Deleted

## 2019-06-02 DIAGNOSIS — M810 Age-related osteoporosis without current pathological fracture: Secondary | ICD-10-CM | POA: Diagnosis not present

## 2019-06-02 DIAGNOSIS — E1169 Type 2 diabetes mellitus with other specified complication: Secondary | ICD-10-CM | POA: Diagnosis not present

## 2019-06-02 DIAGNOSIS — J449 Chronic obstructive pulmonary disease, unspecified: Secondary | ICD-10-CM | POA: Diagnosis not present

## 2019-06-02 DIAGNOSIS — I69961 Other paralytic syndrome following unspecified cerebrovascular disease affecting right dominant side: Secondary | ICD-10-CM | POA: Diagnosis not present

## 2019-06-02 DIAGNOSIS — R32 Unspecified urinary incontinence: Secondary | ICD-10-CM | POA: Diagnosis not present

## 2019-06-02 DIAGNOSIS — M6281 Muscle weakness (generalized): Secondary | ICD-10-CM | POA: Diagnosis not present

## 2019-06-02 DIAGNOSIS — Z9181 History of falling: Secondary | ICD-10-CM | POA: Diagnosis not present

## 2019-06-02 DIAGNOSIS — D509 Iron deficiency anemia, unspecified: Secondary | ICD-10-CM | POA: Diagnosis not present

## 2019-06-02 NOTE — Telephone Encounter (Signed)
It is okay to hold aspirin and Plavix for up to 7 days and then resume when safe.  She is on these medications for prior stroke.  I did not prescribe them.

## 2019-06-02 NOTE — Telephone Encounter (Signed)
   [facility] Pre-operative Risk Assessment    Request for surgical clearance:  1. What type of surgery is being performed? ENTROPION REPAIR w/TARSAL STRIP-LEFT   2. When is this surgery scheduled? TBD   3. What type of clearance is required (medical clearance vs. Pharmacy clearance to hold med vs. Both)? MEDICAL  4. Are there any medications that need to be held prior to surgery and how long? ASA AND PLAVIX X 5 DAYS PRIOR TO PROCEDURE   5. Practice name and name of physician performing surgery? [facility]; DR. Tama High   6. What is your office phone number [phone] EXT Tool, NP    7.   What is your office fax number [phone]  8.   Anesthesia type (None, local, MAC, general) ? IV SEDATION   Julaine Hua 06/02/2019, 10:03 AM  _________________________________________________________________   (provider comments below)

## 2019-06-02 NOTE — Telephone Encounter (Signed)
   Primary Cardiologist: Sinclair Grooms, MD  Chart reviewed as part of pre-operative protocol coverage.   History of CVA with right-sided residual weakness, coronary artery spasm, obesity, DM 2, COPD, GERD and normal myocardial perfusion study performed 10/2015.  Dr. Tamala Julian, the patient was doing well on cardiac stand point when seen by you 04/01/19. Please give your recommendations regarding antiplatelet therapy for below procedure.     [place], [place] 06/02/2019, 12:44 PM

## 2019-06-16 DIAGNOSIS — Z23 Encounter for immunization: Secondary | ICD-10-CM | POA: Diagnosis not present

## 2019-06-23 DIAGNOSIS — H02005 Unspecified entropion of left lower eyelid: Secondary | ICD-10-CM | POA: Diagnosis not present

## 2019-07-14 ENCOUNTER — Other Ambulatory Visit: Payer: Self-pay | Admitting: Interventional Cardiology

## 2019-07-24 DIAGNOSIS — R262 Difficulty in walking, not elsewhere classified: Secondary | ICD-10-CM | POA: Diagnosis not present

## 2019-08-20 DIAGNOSIS — E1169 Type 2 diabetes mellitus with other specified complication: Secondary | ICD-10-CM | POA: Diagnosis not present

## 2019-08-20 DIAGNOSIS — L508 Other urticaria: Secondary | ICD-10-CM | POA: Diagnosis not present

## 2019-08-20 DIAGNOSIS — L309 Dermatitis, unspecified: Secondary | ICD-10-CM | POA: Diagnosis not present

## 2019-09-20 IMAGING — MR MR HEAD W/O CM
10 of 11 series · 42 of 48 positions shown · non-contrast
Comparison: Head CT 09/17/2017

CLINICAL DATA: TIA.  Confusion and slurring of speech.

EXAM:
MRI HEAD WITHOUT CONTRAST
TECHNIQUE: Multiplanar, multiecho pulse sequences of the brain and surrounding
structures were obtained without intravenous contrast.

[Series 5: ax dwi_tracew · axial · 3.0mm · 1.50mm/px · z∈[-42,+86]mm · 9 of 80 slices shown]
[im 1/80]
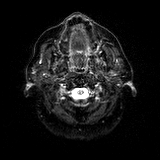
[im 10/80]
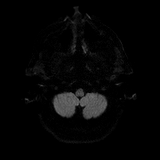
[im 20/80]
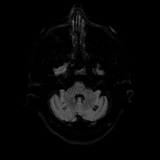
[im 30/80]
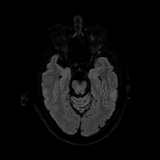
[im 40/80]
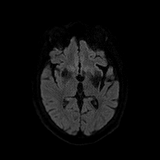
[im 50/80]
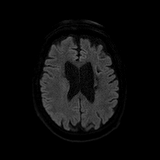
[im 60/80]
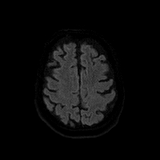
[im 70/80]
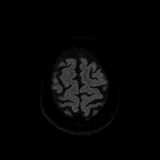
[im 80/80]
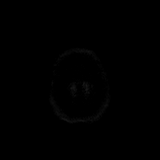

[Series 6: ax dwi_adc · axial · 3.0mm · 1.50mm/px · z∈[-42,+86]mm · 4 of 40 slices shown]
[im 1/40]
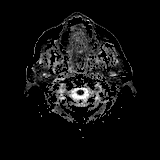
[im 14/40]
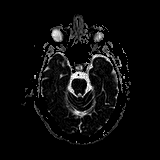
[im 27/40]
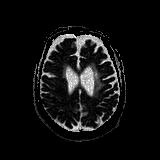
[im 40/40]
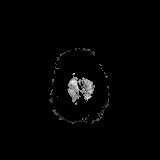

[Series 7: DWI · coronal · 4.0mm · 0.88mm/px · 6 of 64 slices shown (1 of 2)]
[im 1/64]
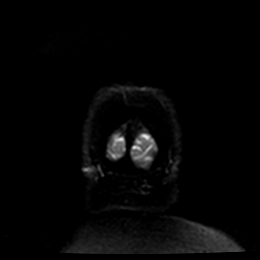
[im 13/64]
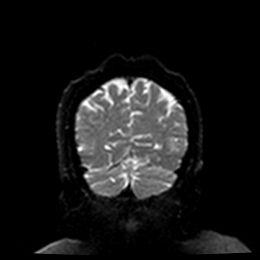
[im 26/64]
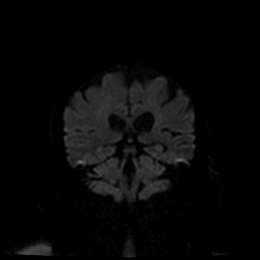
[im 38/64]
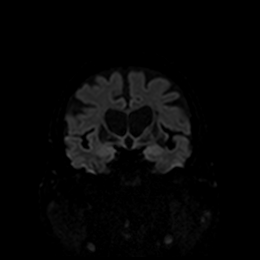
[im 51/64]
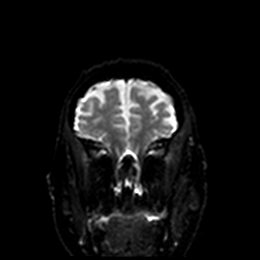
[im 64/64]
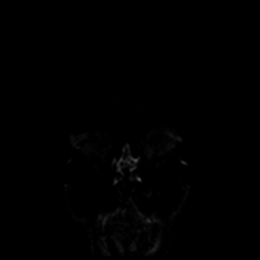

[Series 8: DWI · coronal · 4.0mm · 0.88mm/px · 3 of 32 slices shown (2 of 2)]
[im 1/32]
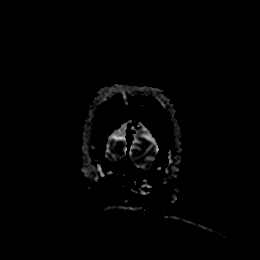
[im 16/32]
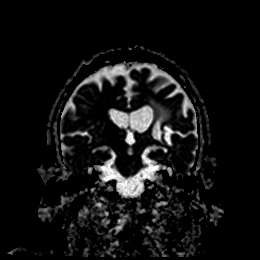
[im 32/32]
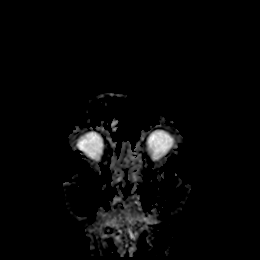

[Series 9: T1 · sagittal · 5.0mm · 0.75mm/px · 2 of 23 slices shown]
[im 1/23]
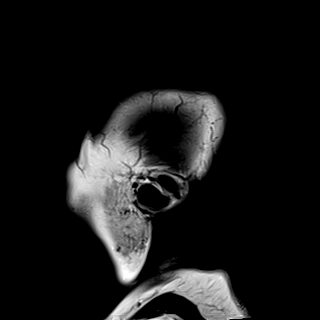
[im 23/23]
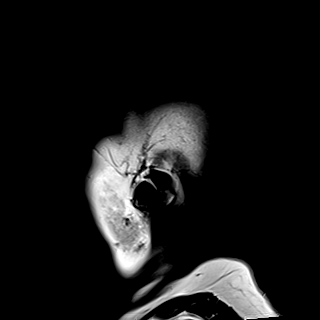

[Series 10: T2 · axial · 5.0mm · 0.72mm/px · z∈[-46,+84]mm · 2 of 23 slices shown (1 of 2)]
[im 1/23]
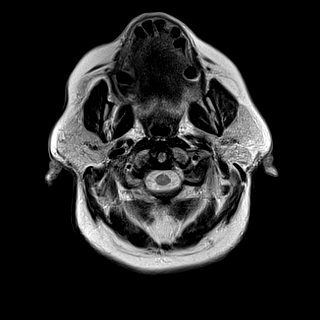
[im 23/23]
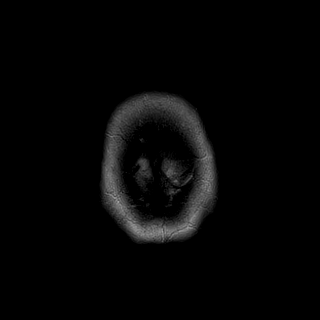

[Series 11: FLAIR · axial · 5.0mm · 0.45mm/px · z∈[-46,+84]mm · 2 of 23 slices shown]
[im 1/23]
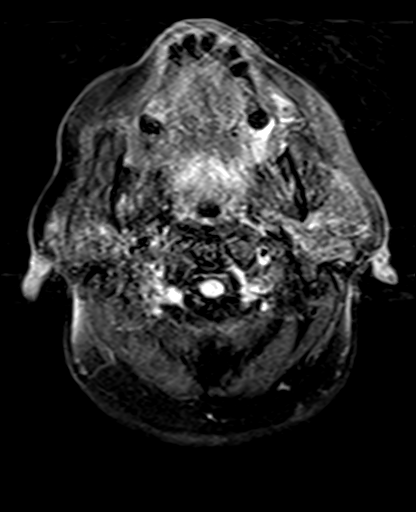
[im 23/23]
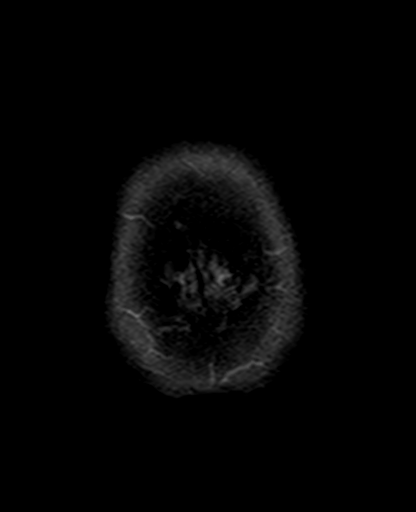

[Series 12: swi_images · axial · 3.0mm · 0.90mm/px · z∈[-61,+115]mm · 6 of 60 slices shown]
[im 1/60]
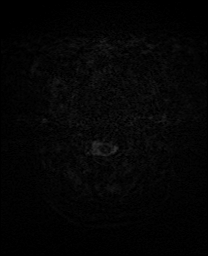
[im 12/60]
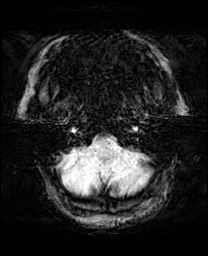
[im 24/60]
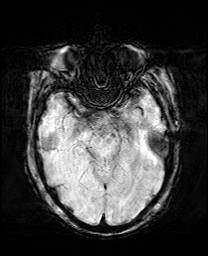
[im 36/60]
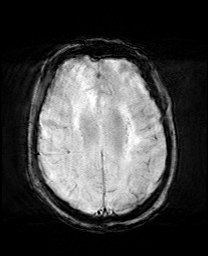
[im 48/60]
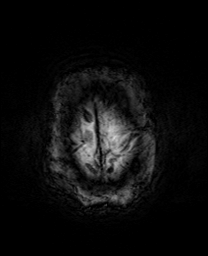
[im 60/60]
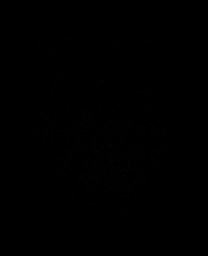

[Series 13: mip_images(sw) · axial · 24.0mm · 0.90mm/px · z∈[-50,+104]mm · 5 of 53 slices shown]
[im 1/53]
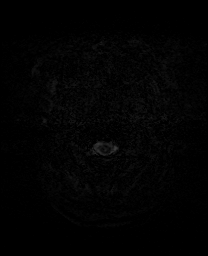
[im 14/53]
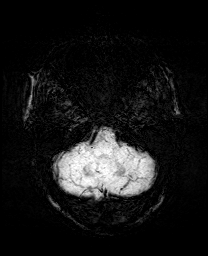
[im 27/53]
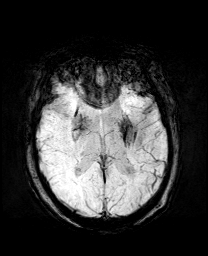
[im 40/53]
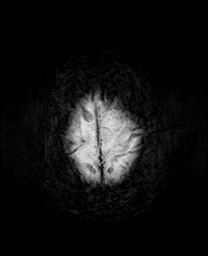
[im 53/53]
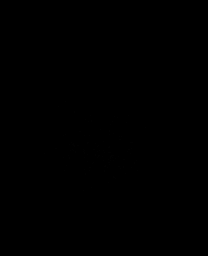

[Series 15: T2 · coronal · 5.0mm · 0.34mm/px · 3 of 27 slices shown (2 of 2)]
[im 1/27]
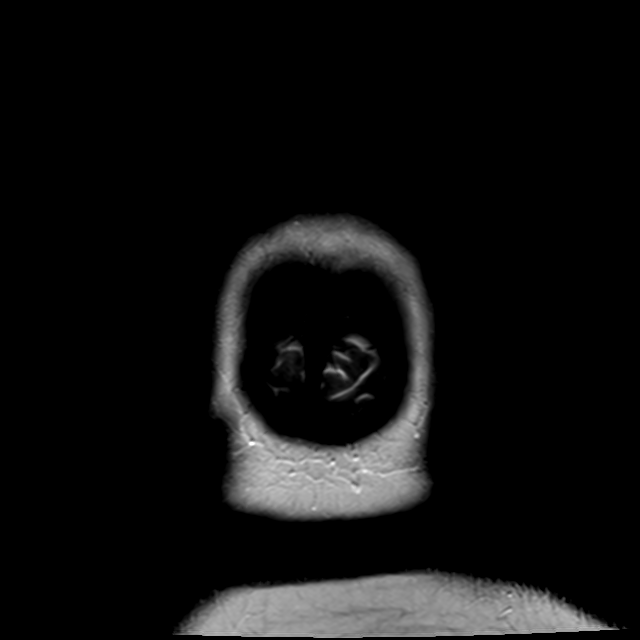
[im 14/27]
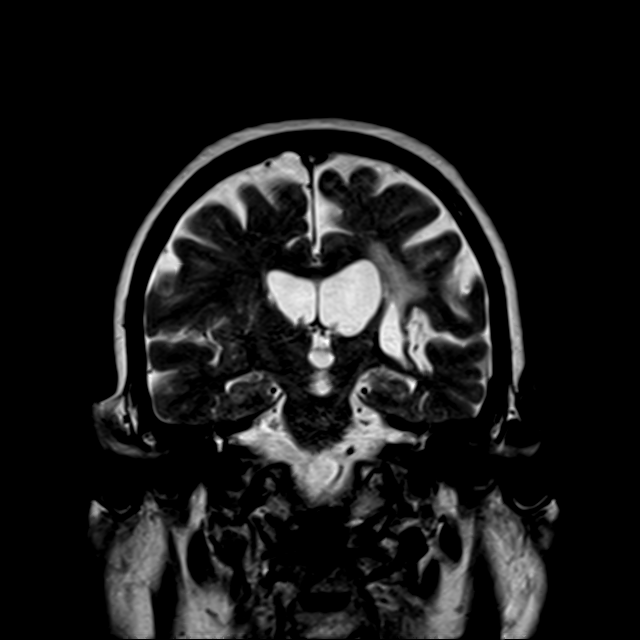
[im 27/27]
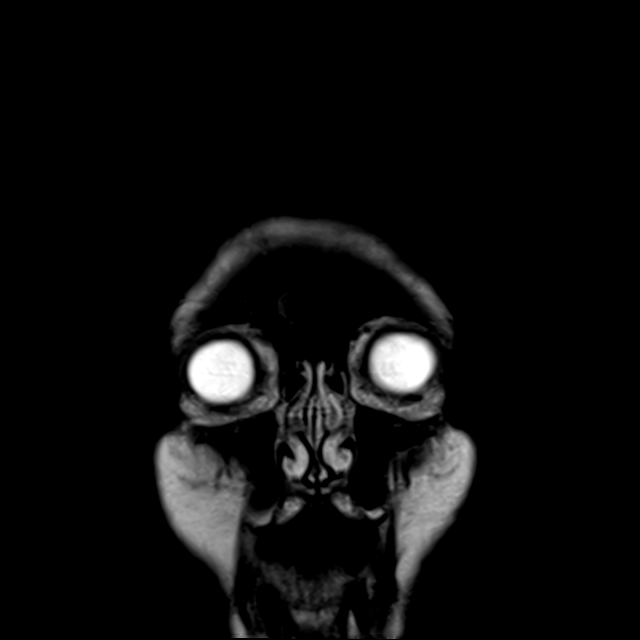

[42 of 48 positions shown; findings below may reference images not displayed]

FINDINGS: BRAIN: There is no acute infarct, acute hemorrhage or mass effect.
The midline structures are normal. Multiple old bilateral cerebellar
infarcts. Bilateral old basal ganglia infarcts. Early confluent
hyperintense T2-weighted signal of the periventricular and deep
white matter, most commonly due to chronic ischemic microangiopathy.
Generalized atrophy without lobar predilection.
Susceptibility-sensitive sequences show no chronic microhemorrhage
or superficial siderosis.

VASCULAR: Major intracranial arterial and venous sinus flow voids
are preserved.

SKULL AND UPPER CERVICAL SPINE: The visualized skull base,
calvarium, upper cervical spine and extracranial soft tissues are
normal.

SINUSES/ORBITS: Small amount of fluid in both mastoids. Paranasal
sinuses are clear. There are bilateral lens replacements.
IMPRESSION: 1. No acute abnormality.
2. Multiple old infarcts and sequelae of chronic ischemic
microangiopathy.
3. Generalized atrophy without lobar predilection.

## 2019-11-04 DIAGNOSIS — M810 Age-related osteoporosis without current pathological fracture: Secondary | ICD-10-CM | POA: Diagnosis not present

## 2019-11-04 DIAGNOSIS — R5383 Other fatigue: Secondary | ICD-10-CM | POA: Diagnosis not present

## 2019-11-04 DIAGNOSIS — E1169 Type 2 diabetes mellitus with other specified complication: Secondary | ICD-10-CM | POA: Diagnosis not present

## 2019-11-04 DIAGNOSIS — D509 Iron deficiency anemia, unspecified: Secondary | ICD-10-CM | POA: Diagnosis not present

## 2019-11-06 ENCOUNTER — Emergency Department (HOSPITAL_COMMUNITY)
Admission: EM | Admit: 2019-11-06 | Discharge: 2019-11-06 | Disposition: A | Discharge status: Home / Self Care | Payer: Medicare Other | Source: Home / Self Care

## 2019-11-06 ENCOUNTER — Other Ambulatory Visit: Payer: Self-pay

## 2019-11-06 DIAGNOSIS — R7989 Other specified abnormal findings of blood chemistry: Secondary | ICD-10-CM | POA: Diagnosis not present

## 2019-11-06 DIAGNOSIS — R799 Abnormal finding of blood chemistry, unspecified: Secondary | ICD-10-CM | POA: Insufficient documentation

## 2019-11-06 DIAGNOSIS — E785 Hyperlipidemia, unspecified: Secondary | ICD-10-CM | POA: Diagnosis not present

## 2019-11-06 DIAGNOSIS — K922 Gastrointestinal hemorrhage, unspecified: Secondary | ICD-10-CM | POA: Diagnosis not present

## 2019-11-06 DIAGNOSIS — R5383 Other fatigue: Secondary | ICD-10-CM | POA: Diagnosis not present

## 2019-11-06 DIAGNOSIS — D649 Anemia, unspecified: Secondary | ICD-10-CM | POA: Diagnosis not present

## 2019-11-06 DIAGNOSIS — Z7982 Long term (current) use of aspirin: Secondary | ICD-10-CM | POA: Diagnosis not present

## 2019-11-06 DIAGNOSIS — K2961 Other gastritis with bleeding: Secondary | ICD-10-CM | POA: Diagnosis not present

## 2019-11-06 DIAGNOSIS — K5731 Diverticulosis of large intestine without perforation or abscess with bleeding: Secondary | ICD-10-CM | POA: Diagnosis not present

## 2019-11-06 DIAGNOSIS — Z5321 Procedure and treatment not carried out due to patient leaving prior to being seen by health care provider: Secondary | ICD-10-CM | POA: Insufficient documentation

## 2019-11-06 DIAGNOSIS — D509 Iron deficiency anemia, unspecified: Secondary | ICD-10-CM | POA: Diagnosis not present

## 2019-11-06 DIAGNOSIS — Z7902 Long term (current) use of antithrombotics/antiplatelets: Secondary | ICD-10-CM | POA: Diagnosis not present

## 2019-11-06 DIAGNOSIS — I69351 Hemiplegia and hemiparesis following cerebral infarction affecting right dominant side: Secondary | ICD-10-CM | POA: Diagnosis not present

## 2019-11-06 DIAGNOSIS — Z20822 Contact with and (suspected) exposure to covid-19: Secondary | ICD-10-CM | POA: Diagnosis not present

## 2019-11-06 LAB — BASIC METABOLIC PANEL
Anion gap: 9 (ref 5–15)
BUN: 14 mg/dL (ref 8–23)
CO2: 24 mmol/L (ref 22–32)
Calcium: 8.9 mg/dL (ref 8.9–10.3)
Chloride: 104 mmol/L (ref 98–111)
Creatinine, Ser: 0.95 mg/dL (ref 0.44–1.00)
GFR calc Af Amer: >60 mL/min (ref >60)
GFR calc non Af Amer: 59 mL/min — ABNORMAL LOW (ref >60)
Glucose, Bld: 120 mg/dL — ABNORMAL HIGH (ref 70–99)
Potassium: 4.1 mmol/L (ref 3.5–5.1)
Sodium: 137 mmol/L (ref 135–145)

## 2019-11-06 LAB — TYPE AND SCREEN
ABO/RH(D): A POS
Antibody Screen: NEGATIVE

## 2019-11-06 LAB — CBC
HCT: 24.2 % — ABNORMAL LOW (ref 36.0–46.0)
Hemoglobin: 6 g/dL — CL (ref 12.0–15.0)
MCH: 16 pg — ABNORMAL LOW (ref 26.0–34.0)
MCHC: 24.8 g/dL — ABNORMAL LOW (ref 30.0–36.0)
MCV: 64.4 fL — ABNORMAL LOW (ref 80.0–100.0)
Platelets: 276 10*3/uL (ref 150–400)
RBC: 3.76 MIL/uL — ABNORMAL LOW (ref 3.87–5.11)
RDW: 23.3 % — ABNORMAL HIGH (ref 11.5–15.5)
WBC: 6.3 10*3/uL (ref 4.0–10.5)
nRBC: 0 % (ref 0.0–0.2)

## 2019-11-06 NOTE — ED Notes (Signed)
Patients husband came rushing up to this writer demanding we let his wife go so he can take her to a real hospital where she can get help. Tried to tell him I could get a nurse but he pushed the patient out.

## 2019-11-06 NOTE — ED Triage Notes (Signed)
Pt had routine blood drawn today and she was called and told to come to the ER for blood transfusion. Pt denies blood in stool or other obvious sources of bleeding.

## 2019-11-07 ENCOUNTER — Observation Stay: Payer: Self-pay

## 2019-11-07 ENCOUNTER — Inpatient Hospital Stay (HOSPITAL_COMMUNITY)
Admission: EM | Admit: 2019-11-07 | Discharge: 2019-11-12 | DRG: 378 | Disposition: A | Discharge status: Home / Self Care | Payer: Medicare Other | Attending: Internal Medicine | Admitting: Internal Medicine

## 2019-11-07 ENCOUNTER — Other Ambulatory Visit: Payer: Self-pay

## 2019-11-07 ENCOUNTER — Encounter (HOSPITAL_COMMUNITY): Payer: Self-pay | Admitting: Emergency Medicine

## 2019-11-07 DIAGNOSIS — Z853 Personal history of malignant neoplasm of breast: Secondary | ICD-10-CM

## 2019-11-07 DIAGNOSIS — K922 Gastrointestinal hemorrhage, unspecified: Secondary | ICD-10-CM | POA: Diagnosis not present

## 2019-11-07 DIAGNOSIS — Z7902 Long term (current) use of antithrombotics/antiplatelets: Secondary | ICD-10-CM

## 2019-11-07 DIAGNOSIS — E119 Type 2 diabetes mellitus without complications: Secondary | ICD-10-CM | POA: Diagnosis present

## 2019-11-07 DIAGNOSIS — Z6834 Body mass index (BMI) 34.0-34.9, adult: Secondary | ICD-10-CM

## 2019-11-07 DIAGNOSIS — G8191 Hemiplegia, unspecified affecting right dominant side: Secondary | ICD-10-CM

## 2019-11-07 DIAGNOSIS — M810 Age-related osteoporosis without current pathological fracture: Secondary | ICD-10-CM | POA: Diagnosis present

## 2019-11-07 DIAGNOSIS — Z800 Family history of malignant neoplasm of digestive organs: Secondary | ICD-10-CM

## 2019-11-07 DIAGNOSIS — D649 Anemia, unspecified: Secondary | ICD-10-CM

## 2019-11-07 DIAGNOSIS — R5381 Other malaise: Secondary | ICD-10-CM | POA: Diagnosis not present

## 2019-11-07 DIAGNOSIS — I69351 Hemiplegia and hemiparesis following cerebral infarction affecting right dominant side: Secondary | ICD-10-CM

## 2019-11-07 DIAGNOSIS — I10 Essential (primary) hypertension: Secondary | ICD-10-CM | POA: Diagnosis not present

## 2019-11-07 DIAGNOSIS — D500 Iron deficiency anemia secondary to blood loss (chronic): Secondary | ICD-10-CM | POA: Diagnosis not present

## 2019-11-07 DIAGNOSIS — K5731 Diverticulosis of large intestine without perforation or abscess with bleeding: Principal | ICD-10-CM | POA: Diagnosis present

## 2019-11-07 DIAGNOSIS — Z8261 Family history of arthritis: Secondary | ICD-10-CM

## 2019-11-07 DIAGNOSIS — Z20822 Contact with and (suspected) exposure to covid-19: Secondary | ICD-10-CM | POA: Diagnosis present

## 2019-11-07 DIAGNOSIS — Z803 Family history of malignant neoplasm of breast: Secondary | ICD-10-CM

## 2019-11-07 DIAGNOSIS — Z7982 Long term (current) use of aspirin: Secondary | ICD-10-CM

## 2019-11-07 DIAGNOSIS — D62 Acute posthemorrhagic anemia: Secondary | ICD-10-CM | POA: Diagnosis not present

## 2019-11-07 DIAGNOSIS — Z7951 Long term (current) use of inhaled steroids: Secondary | ICD-10-CM

## 2019-11-07 DIAGNOSIS — I2510 Atherosclerotic heart disease of native coronary artery without angina pectoris: Secondary | ICD-10-CM | POA: Diagnosis present

## 2019-11-07 DIAGNOSIS — E669 Obesity, unspecified: Secondary | ICD-10-CM | POA: Diagnosis present

## 2019-11-07 DIAGNOSIS — I69320 Aphasia following cerebral infarction: Secondary | ICD-10-CM

## 2019-11-07 DIAGNOSIS — Z79899 Other long term (current) drug therapy: Secondary | ICD-10-CM

## 2019-11-07 DIAGNOSIS — Z9012 Acquired absence of left breast and nipple: Secondary | ICD-10-CM

## 2019-11-07 DIAGNOSIS — K449 Diaphragmatic hernia without obstruction or gangrene: Secondary | ICD-10-CM | POA: Diagnosis present

## 2019-11-07 DIAGNOSIS — K219 Gastro-esophageal reflux disease without esophagitis: Secondary | ICD-10-CM | POA: Diagnosis present

## 2019-11-07 DIAGNOSIS — I252 Old myocardial infarction: Secondary | ICD-10-CM

## 2019-11-07 DIAGNOSIS — Z8673 Personal history of transient ischemic attack (TIA), and cerebral infarction without residual deficits: Secondary | ICD-10-CM

## 2019-11-07 DIAGNOSIS — R195 Other fecal abnormalities: Secondary | ICD-10-CM | POA: Diagnosis not present

## 2019-11-07 DIAGNOSIS — Z86010 Personal history of colonic polyps: Secondary | ICD-10-CM

## 2019-11-07 DIAGNOSIS — Z9049 Acquired absence of other specified parts of digestive tract: Secondary | ICD-10-CM

## 2019-11-07 DIAGNOSIS — E1165 Type 2 diabetes mellitus with hyperglycemia: Secondary | ICD-10-CM | POA: Diagnosis present

## 2019-11-07 DIAGNOSIS — K2961 Other gastritis with bleeding: Secondary | ICD-10-CM | POA: Diagnosis present

## 2019-11-07 DIAGNOSIS — Z90710 Acquired absence of both cervix and uterus: Secondary | ICD-10-CM

## 2019-11-07 DIAGNOSIS — Z8249 Family history of ischemic heart disease and other diseases of the circulatory system: Secondary | ICD-10-CM

## 2019-11-07 DIAGNOSIS — D509 Iron deficiency anemia, unspecified: Secondary | ICD-10-CM | POA: Diagnosis present

## 2019-11-07 DIAGNOSIS — Z8042 Family history of malignant neoplasm of prostate: Secondary | ICD-10-CM

## 2019-11-07 DIAGNOSIS — J449 Chronic obstructive pulmonary disease, unspecified: Secondary | ICD-10-CM

## 2019-11-07 DIAGNOSIS — K625 Hemorrhage of anus and rectum: Secondary | ICD-10-CM | POA: Diagnosis not present

## 2019-11-07 DIAGNOSIS — F329 Major depressive disorder, single episode, unspecified: Secondary | ICD-10-CM | POA: Diagnosis present

## 2019-11-07 DIAGNOSIS — F32A Depression, unspecified: Secondary | ICD-10-CM

## 2019-11-07 DIAGNOSIS — Z833 Family history of diabetes mellitus: Secondary | ICD-10-CM

## 2019-11-07 DIAGNOSIS — IMO0002 Reserved for concepts with insufficient information to code with codable children: Secondary | ICD-10-CM | POA: Diagnosis present

## 2019-11-07 DIAGNOSIS — Z823 Family history of stroke: Secondary | ICD-10-CM

## 2019-11-07 DIAGNOSIS — E785 Hyperlipidemia, unspecified: Secondary | ICD-10-CM | POA: Diagnosis present

## 2019-11-07 LAB — HEMOGLOBIN A1C
Hgb A1c MFr Bld: 5.7 % — ABNORMAL HIGH (ref 4.8–5.6)
Mean Plasma Glucose: 116.89 mg/dL

## 2019-11-07 LAB — COMPREHENSIVE METABOLIC PANEL
ALT: 12 U/L (ref 0–44)
AST: 15 U/L (ref 15–41)
Albumin: 3.3 g/dL — ABNORMAL LOW (ref 3.5–5.0)
Alkaline Phosphatase: 79 U/L (ref 38–126)
Anion gap: 8 (ref 5–15)
BUN: 19 mg/dL (ref 8–23)
CO2: 26 mmol/L (ref 22–32)
Calcium: 8.7 mg/dL — ABNORMAL LOW (ref 8.9–10.3)
Chloride: 106 mmol/L (ref 98–111)
Creatinine, Ser: 1.05 mg/dL — ABNORMAL HIGH (ref 0.44–1.00)
GFR calc Af Amer: >60 mL/min (ref >60)
GFR calc non Af Amer: 52 mL/min — ABNORMAL LOW (ref >60)
Glucose, Bld: 130 mg/dL — ABNORMAL HIGH (ref 70–99)
Potassium: 4 mmol/L (ref 3.5–5.1)
Sodium: 140 mmol/L (ref 135–145)
Total Bilirubin: 0.1 mg/dL — ABNORMAL LOW (ref 0.3–1.2)
Total Protein: 6.4 g/dL — ABNORMAL LOW (ref 6.5–8.1)

## 2019-11-07 LAB — CBC
HCT: 23 % — ABNORMAL LOW (ref 36.0–46.0)
Hemoglobin: 5.9 g/dL — CL (ref 12.0–15.0)
MCH: 16.5 pg — ABNORMAL LOW (ref 26.0–34.0)
MCHC: 25.7 g/dL — ABNORMAL LOW (ref 30.0–36.0)
MCV: 64.2 fL — ABNORMAL LOW (ref 80.0–100.0)
Platelets: 265 10*3/uL (ref 150–400)
RBC: 3.58 MIL/uL — ABNORMAL LOW (ref 3.87–5.11)
RDW: 23.3 % — ABNORMAL HIGH (ref 11.5–15.5)
WBC: 5.6 10*3/uL (ref 4.0–10.5)
nRBC: 0 % (ref 0.0–0.2)

## 2019-11-07 LAB — RETICULOCYTES
Immature Retic Fract: 17.6 % — ABNORMAL HIGH (ref 2.3–15.9)
RBC.: 3.61 MIL/uL — ABNORMAL LOW (ref 3.87–5.11)
Retic Count, Absolute: 52.3 10*3/uL (ref 19.0–186.0)
Retic Ct Pct: 1.5 % (ref 0.4–3.1)

## 2019-11-07 LAB — IRON AND TIBC
Iron: 16 ug/dL — ABNORMAL LOW (ref 28–170)
Saturation Ratios: 4 % — ABNORMAL LOW (ref 10.4–31.8)
TIBC: 449 ug/dL (ref 250–450)
UIBC: 433 ug/dL

## 2019-11-07 LAB — GLUCOSE, CAPILLARY
Glucose-Capillary: 84 mg/dL (ref 70–99)
Glucose-Capillary: 91 mg/dL (ref 70–99)

## 2019-11-07 LAB — FOLATE: Folate: 8.8 ng/mL (ref >5.9)

## 2019-11-07 LAB — PREPARE RBC (CROSSMATCH)

## 2019-11-07 LAB — CBG MONITORING, ED: Glucose-Capillary: 107 mg/dL — ABNORMAL HIGH (ref 70–99)

## 2019-11-07 LAB — VITAMIN B12: Vitamin B-12: 256 pg/mL (ref 180–914)

## 2019-11-07 LAB — POC OCCULT BLOOD, ED: Fecal Occult Bld: POSITIVE — AB

## 2019-11-07 LAB — SARS CORONAVIRUS 2 BY RT PCR (HOSPITAL ORDER, PERFORMED IN ~~LOC~~ HOSPITAL LAB): SARS Coronavirus 2: NEGATIVE

## 2019-11-07 LAB — FERRITIN: Ferritin: 5 ng/mL — ABNORMAL LOW (ref 11–307)

## 2019-11-07 MED ORDER — INSULIN ASPART 100 UNIT/ML ~~LOC~~ SOLN
0.0000 [IU] | Freq: Every day | SUBCUTANEOUS | Status: DC
  Filled 2019-11-07: qty 0.05

## 2019-11-07 MED ORDER — UMECLIDINIUM BROMIDE 62.5 MCG/INH IN AEPB
1.0000 | INHALATION_SPRAY | Freq: Every day | RESPIRATORY_TRACT | Status: DC
  Filled 2019-11-07: qty 7

## 2019-11-07 MED ORDER — UMECLIDINIUM BROMIDE 62.5 MCG/INH IN AEPB
1.0000 | INHALATION_SPRAY | Freq: Every day | RESPIRATORY_TRACT | Status: DC
  Administered 2019-11-08 – 2019-11-12 (×5): 1 via RESPIRATORY_TRACT
  Filled 2019-11-07: qty 7

## 2019-11-07 MED ORDER — SODIUM CHLORIDE 0.9 % IV SOLN
80.0000 mg | Freq: Once | INTRAVENOUS | Status: AC
  Administered 2019-11-07: 80 mg via INTRAVENOUS
  Filled 2019-11-07: qty 80

## 2019-11-07 MED ORDER — SERTRALINE HCL 100 MG PO TABS
100.0000 mg | ORAL_TABLET | Freq: Every day | ORAL | Status: DC
  Administered 2019-11-07 – 2019-11-11 (×5): 100 mg via ORAL
  Filled 2019-11-07 (×5): qty 1

## 2019-11-07 MED ORDER — CHLORHEXIDINE GLUCONATE CLOTH 2 % EX PADS
6.0000 | MEDICATED_PAD | Freq: Every day | CUTANEOUS | Status: DC
  Administered 2019-11-07 – 2019-11-10 (×4): 6 via TOPICAL

## 2019-11-07 MED ORDER — BUPROPION HCL ER (SR) 150 MG PO TB12
150.0000 mg | ORAL_TABLET | Freq: Every evening | ORAL | Status: DC
  Administered 2019-11-07 – 2019-11-11 (×5): 150 mg via ORAL
  Filled 2019-11-07 (×6): qty 1

## 2019-11-07 MED ORDER — IPRATROPIUM-ALBUTEROL 0.5-2.5 (3) MG/3ML IN SOLN: 3.0000 mL | Freq: Four times a day (QID) | RESPIRATORY_TRACT | Status: DC | PRN

## 2019-11-07 MED ORDER — SODIUM CHLORIDE 0.9 % IV SOLN
1000.0000 mg | Freq: Once | INTRAVENOUS | Status: AC
  Administered 2019-11-08: 1000 mg via INTRAVENOUS
  Filled 2019-11-07 (×2): qty 20

## 2019-11-07 MED ORDER — SODIUM CHLORIDE 0.9 % IV SOLN
10.0000 mL/h | Freq: Once | INTRAVENOUS | Status: AC
  Administered 2019-11-09: 20 mL/h via INTRAVENOUS

## 2019-11-07 MED ORDER — SODIUM CHLORIDE 0.9 % IV SOLN
25.0000 mg | Freq: Once | INTRAVENOUS | Status: AC
  Administered 2019-11-07: 25 mg via INTRAVENOUS
  Filled 2019-11-07: qty 0.5

## 2019-11-07 MED ORDER — ATORVASTATIN CALCIUM 40 MG PO TABS
40.0000 mg | ORAL_TABLET | Freq: Every day | ORAL | Status: DC
  Administered 2019-11-07 – 2019-11-11 (×5): 40 mg via ORAL
  Filled 2019-11-07 (×5): qty 1

## 2019-11-07 MED ORDER — ONDANSETRON HCL 4 MG PO TABS: 4.0000 mg | ORAL_TABLET | Freq: Four times a day (QID) | ORAL | Status: DC | PRN

## 2019-11-07 MED ORDER — ONDANSETRON HCL 4 MG/2ML IJ SOLN: 4.0000 mg | Freq: Four times a day (QID) | INTRAMUSCULAR | Status: DC | PRN

## 2019-11-07 MED ORDER — SODIUM CHLORIDE 0.9% FLUSH
10.0000 mL | Freq: Two times a day (BID) | INTRAVENOUS | Status: DC
  Administered 2019-11-07 – 2019-11-12 (×10): 10 mL

## 2019-11-07 MED ORDER — INSULIN ASPART 100 UNIT/ML ~~LOC~~ SOLN
0.0000 [IU] | Freq: Three times a day (TID) | SUBCUTANEOUS | Status: DC
  Filled 2019-11-07: qty 0.09

## 2019-11-07 MED ORDER — SODIUM CHLORIDE 0.9% FLUSH: 10.0000 mL | INTRAVENOUS | Status: DC | PRN

## 2019-11-07 NOTE — ED Notes (Signed)
Unable to gain IV access. IV team attempting at this time with no success. MD Cyndia Skeeters contacted for other options.

## 2019-11-07 NOTE — Progress Notes (Signed)
Report received from ED 

## 2019-11-07 NOTE — ED Triage Notes (Signed)
Per pt, states she was told by PCP to come get a blood transfusion, no obvious signs/symptoms of bleeding-states she went to [facility] yesterday and waited 12 hours and left

## 2019-11-07 NOTE — H&P (Signed)
History and Physical    Brandy Paul MPN:[id number] DOB: [birth year] DOA: 11/07/2019  PCP: Velna Hatchet, MD Patient coming from: Home.  Lives with her husband.  Uses cane at baseline.  Chief Complaint: "Blood transfusion"  HPI: Brandy Paul is a 74 y.o. female with history of CVA with residual right hemiparesis now on Plavix and aspirin, COPD not on oxygen, diet-controlled DM-2, IDA and HTN presented to ED for the above.  Patient was seen by PCP for routine checkup and had blood work.  She received a call from PCP, and was advised to go to ED for blood transfusion yesterday.  She presented to [facility] ED but went home due to long wait.  Initially patient denied any symptoms.  However, she admitted weakness, dyspnea on exertion and lightheadedness for 1 to 2 weeks on review of system.  She denies chest pain, heartburn, abdominal pain, diarrhea, melena or hematochezia.  She denies cough, fever, chills, URI symptoms or UTI symptoms.  She has residual right-sided hemiparesis from a CVA in 2015.  She denies any over-the-counter NSAID use.   Patient lives with her husband.  Uses cane at baseline.  Denies smoking cigarettes, drinking alcohol or recreational drug use.  She wishes to remain full code.  In ED, hemodynamically stable.  FOBT positive.  Hgb 6.0 (8.0 in 2019).  MCV 64. Cr 1.05.  BUN 19. ED ordered 1u of pRBC.  Hospitalist service called for admission for anemia and GI bleed.  EDP to consult GI.   ROS All review of system negative except for pertinent positives and negatives as history of present illness above.  PMH Past Medical History:  Diagnosis Date  . Adenomatous colon polyp   . Breast cancer ([place]) 2005   left  . COPD (chronic obstructive pulmonary disease) (HCC)    hx of tobacco use  . Coronary artery spasm ([place])   . Depression   . GERD (gastroesophageal reflux disease)   . Hyperlipemia   . IDA (iron deficiency anemia)    last iron infusion 4 months ago  . Obesity   .  Osteoporosis   . 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 Prediabetes    hx of  . Rash    under right breast and groin line using nizoral ointment to q day per dermatology saw few weeks ago  . Stress incontinence wers depends  . Stroke [facility]) 2013   paralytic syndrome of dominant right side  . Toe fracture, left    PSH Past Surgical History:  Procedure Laterality Date  . ABDOMINAL HYSTERECTOMY  1989   complete  . CHOLECYSTECTOMY N/A 12/10/2017   Procedure: LAPAROSCOPIC CHOLECYSTECTOMY WITH INTRAOPERATIVE  ERAS PATHWAY;  Surgeon: Johnathan Hausen, MD;  [facility];  Service: General;  Laterality: N/A;  . COLONOSCOPY WITH PROPOFOL N/A 12/03/2015   Procedure: COLONOSCOPY WITH PROPOFOL;  Surgeon: Mauri Pole, MD;  [facility];  Service: Endoscopy;  Laterality: N/A;  tba before procdcedure  . IR CHOLANGIOGRAM EXISTING TUBE  08/08/2017  . IR CHOLANGIOGRAM EXISTING TUBE  08/27/2017  . IR PERC CHOLECYSTOSTOMY  07/13/2017  . LAPAROSCOPIC APPENDECTOMY N/A 12/10/2017   Procedure: APPENDECTOMY LAPAROSCOPIC;  Surgeon: Johnathan Hausen, MD;  [facility];  Service: General;  Laterality: N/A;  . MASTECTOMY Left 2005   1 lymph node removed  . RADIAL KERATOTOMY Bilateral   . ROBOTIC ASSISTED BILATERAL SALPINGO OOPHERECTOMY Bilateral 11/04/2015   Procedure: XI ROBOTIC ASSISTED BILATERAL SALPINGO OOPHORECTOMY;  Surgeon: Everitt Amber, MD;  [facility];  Service: Gynecology;  Laterality: Bilateral;  . TONSILLECTOMY     Fam HX Family History  Problem Relation Age of Onset  . CVA Mother   . Liver cancer Mother   . Prostate cancer Father   . Stroke Maternal Grandfather   . Hypertension Sister   . Arthritis Sister   . Diabetes Sister   . Heart attack Sister   . Breast cancer Sister   . Colon polyps Sister   . Colon cancer Neg Hx   . Esophageal cancer Neg Hx   . Rectal cancer Neg Hx     Social Hx  reports that she quit smoking about 25 years ago. Her smoking use  included cigarettes. She has never used smokeless tobacco. She reports that she does not drink alcohol and does not use drugs.  Allergy Allergies  Allergen Reactions  . Ceclor [Cefaclor] Other (See Comments)    REACTION: "SEVERE HEADACHE"   Home Meds Prior to Admission medications   Medication Sig Start Date End Date Taking? Authorizing Provider  acetaminophen (TYLENOL) 500 MG tablet Take 500 mg by mouth daily as needed for mild pain.    Yes [provider]  aspirin EC 81 MG tablet Take 81 mg by mouth daily.   Yes [provider]  atorvastatin (LIPITOR) 40 MG tablet Take 40 mg by mouth at bedtime.  06/27/18  Yes [provider]  buPROPion (WELLBUTRIN SR) 150 MG 12 hr tablet Take 150 mg by mouth every evening.  05/18/17  Yes [provider]  calcium carbonate (OSCAL) 1500 (600 Ca) MG TABS tablet Take 600 mg of elemental calcium by mouth daily.   Yes [provider]  clopidogrel (PLAVIX) 75 MG tablet Take 1 tablet (75 mg total) by mouth at bedtime. 11/07/15  Yes Cross, Melissa D, NP  ezetimibe (ZETIA) 10 MG tablet Take 10 mg by mouth at bedtime.    Yes [provider]  isosorbide mononitrate (IMDUR) 60 MG 24 hr tablet TAKE 1 TABLET BY MOUTH EVERY DAY Patient taking differently: Take 60 mg by mouth daily.  07/14/19  Yes Belva Crome, MD  nitroGLYCERIN (NITROSTAT) 0.4 MG SL tablet Place 1 tablet (0.4 mg total) under the tongue every 5 (five) minutes as needed for chest pain (Call 911 at 3rd dose within 15 minutes). 11/08/17  Yes Isaiah Serge, NP  RABEprazole (ACIPHEX) 20 MG tablet Take 20 mg by mouth at bedtime.  10/29/14  Yes [provider]  sertraline (ZOLOFT) 100 MG tablet Take 100 mg by mouth at bedtime.  08/16/17  Yes [provider]  tiotropium (SPIRIVA HANDIHALER) 18 MCG inhalation capsule Place 18 mcg into inhaler and inhale every evening.    Yes [provider]  hydrocortisone (ANUSOL-HC) 25 MG suppository  Place 1 suppository (25 mg total) rectally 2 (two) times daily as needed for hemorrhoids or anal itching. Avoid using more than 7 days at a time Patient not taking: Reported on 11/07/2019 02/07/19   Mauri Pole, MD    Physical Exam: Vitals:   11/07/19 0832 11/07/19 0943 11/07/19 1023 11/07/19 1118  BP: 124/70 (!) 133/39 (!) 144/42 (!) 99/47  Pulse: 83 82 85 85  Resp: 16 18 20 18   Temp: 97.8 F (36.6 C) 98.6 F (37 C)    TempSrc: Oral Oral    SpO2: 97% 99% 98% 100%    GENERAL: No acute distress.  Appears well.  HEENT: MMM.  Vision and hearing grossly intact.  NECK:  Supple.  No apparent JVD.  RESP: On room air.  No IWOB. Good air movement bilaterally. CVS:  RRR. Heart sounds normal.  ABD/GI/GU: Bowel sounds present. Soft. Non tender.  MSK/EXT:  Moves extremities. No apparent deformity or edema.  SKIN: no apparent skin lesion or wound NEURO: Awake, alert and oriented x4 except date.  Right hemiparesis more pronounced in upper extremity PSYCH: Calm. Normal affect.   Personally Reviewed Radiological Exams [place] EKG SITE RITE  Result Date: 11/07/2019 If Site Rite image not attached, placement could not be confirmed due to current cardiac rhythm.    Personally Reviewed Labs: CBC: Recent Labs  Lab 11/06/19 1324 11/07/19 1046  WBC 6.3 5.6  HGB 6.0* 5.9*  HCT 24.2* 23.0*  MCV 64.4* 64.2*  PLT 276 546   Basic Metabolic Panel: Recent Labs  Lab 11/06/19 1324 11/07/19 1046  NA 137 140  K 4.1 4.0  CL 104 106  CO2 24 26  GLUCOSE 120* 130*  BUN 14 19  CREATININE 0.95 1.05*  CALCIUM 8.9 8.7*   GFR: Estimated Creatinine Clearance: 49.2 mL/min (A) (by C-G formula based on SCr of 1.05 mg/dL (H)). Liver Function Tests: Recent Labs  Lab 11/07/19 1046  AST 15  ALT 12  ALKPHOS 79  BILITOT 0.1*  PROT 6.4*  ALBUMIN 3.3*   No results for input(s): LIPASE, AMYLASE in the last 168 hours. No results for input(s): AMMONIA in the last 168 hours. Coagulation Profile: No  results for input(s): INR, PROTIME in the last 168 hours. Cardiac Enzymes: No results for input(s): CKTOTAL, CKMB, CKMBINDEX, TROPONINI in the last 168 hours. BNP (last 3 results) No results for input(s): PROBNP in the last 8760 hours. HbA1C: No results for input(s): HGBA1C in the last 72 hours. CBG: No results for input(s): GLUCAP in the last 168 hours. Lipid Profile: No results for input(s): CHOL, HDL, LDLCALC, TRIG, CHOLHDL, LDLDIRECT in the last 72 hours. Thyroid Function Tests: No results for input(s): TSH, T4TOTAL, FREET4, T3FREE, THYROIDAB in the last 72 hours. Anemia Panel: No results for input(s): VITAMINB12, FOLATE, FERRITIN, TIBC, IRON, RETICCTPCT in the last 72 hours. Urine analysis:    Component Value Date/Time   COLORURINE YELLOW 09/17/2017 [place] 09/17/2017 1256   LABSPEC 1.019 09/17/2017 1256   PHURINE 5.0 09/17/2017 1256   GLUCOSEU NEGATIVE 09/17/2017 1256   HGBUR NEGATIVE 09/17/2017 1256   BILIRUBINUR NEGATIVE 09/17/2017 1256   KETONESUR NEGATIVE 09/17/2017 1256   PROTEINUR NEGATIVE 09/17/2017 1256   NITRITE NEGATIVE 09/17/2017 1256   LEUKOCYTESUR NEGATIVE 09/17/2017 1256    Sepsis Labs:  None  Personally Reviewed EKG:  Not obtained.  Assessment/Plan Symptomatic acute blood loss anemia due to rectal bleed-unclear source of bleeding but suspect slow upper GI bleed.  Hgb 6.0 (was 8.0 in 2019).  She is on Plavix and aspirin for secondary prevention after CVA in 2013. -Agree with 1u pRBC. -Check anemia panel-pending -Follow GI recs -Continue PPI.  Monitor H&H -Clear liquid diet  History of CVA with residual right hemiparesis -Hold Plavix and aspirin in the setting of GI bleed -Continue statin  Diet controlled DM-2 with hyperglycemia: Not on medications. -Check hemoglobin A1c -CBG monitoring and SSI-thin -Continue statin as above  Chronic COPD: Stable.  On aspirin at home. -As needed DuoNeb -Incruse Ellipta here  Essential  hypertension: Normotensive. -Hold home antihypertensive medications  Anxiety/depression: Stable -Continue home Wellbutrin and Zoloft  Debility: Patient with residual hemiparesis.  Uses cane at baseline. -PT/OT eval  DVT prophylaxis: SCD  in the setting of GI bleed  Code Status: Full code-confirmed with patient and patient's husband at bedside Family Communication: Updated patient's husband at bedside  Disposition Plan: MedSurg Consults called: GI by EDP Admission status: Observation   Mercy Riding MD [facility]  If 7PM-7AM, please contact night-coverage [email or web address]  11/07/2019, 11:35 AM

## 2019-11-07 NOTE — ED Notes (Signed)
Pt placed on Purewick (per her request) at 50 mmHg.

## 2019-11-07 NOTE — ED Provider Notes (Signed)
Medical screening examination/treatment/procedure(s) were conducted as a shared visit with non-physician practitioner(s) and myself.  I personally evaluated the patient during the encounter. Briefly, the patient is a 74 y.o. female with history of COPD, iron deficiency anemia, history of breast cancer, high cholesterol presents the ED with weakness, low hemoglobin.  Had lab work done earlier this week that showed low hemoglobin.  Went to [facility] yesterday for treatment but left due to wait times.  Blood work was drawn again that showed hemoglobin of 6 and was called back to the hospital as she likely needs transfusion.  She has been weak and short of breath with exertion.  Decreased energy.  Patient looks pale.  Denies any melena or hematochezia.  However patient is on Plavix.  According to the husband he has not been great about giving her her iron pills.  Hemoccult was positive.  Overall suspect that this is likely a chronic process but now she is symptomatic from her anemia.  Will give blood transfusion and admit to the hospital for further care.  We will repeat lab work. Lower concern for GI bleed.  This chart was dictated using voice recognition software.  Despite best efforts to proofread,  errors can occur which can change the documentation meaning.    EKG Interpretation None           Lennice Sites, DO 11/07/19 [phone]

## 2019-11-07 NOTE — ED Provider Notes (Signed)
[facility] DEPT Provider Note   CSN: [id number] Arrival date & time: 11/07/19  6962     History Chief Complaint  Patient presents with  . low HGB    Brandy Paul is a 74 y.o. female.  Brandy Paul is a 74 y.o. female with a history of COPD, hyperlipidemia, stroke, diabetes, iron deficiency anemia, GERD, who presents to the emergency department for evaluation of low hemoglobin.  She states that this week she saw her doctor for routine checkup, and was called and told that she had a hemoglobin of 6 and instructed to go to the hospital for transfusion.  She went to [facility] yesterday and had a confirmed hemoglobin of 6.0, but after waiting for 12 hours she and her husband left, they returned this morning for further evaluation and treatment.  She reports that she has been more fatigued recently and has had some dyspnea on exertion.  Denies any chest pain.  States she has had hemoglobin before but does not think she has ever had to have a blood transfusion.  Her husband states she has not been taking her iron pills regularly recently, and has certainly become more fatigued in the last few weeks.  She has not noted any dark black stools or blood in her stool.  Denies any other bleeding symptoms.  She is on aspirin and Plavix, but denies any other blood thinners.  Does not think she has had any prior history of GI bleeding, has been seen by [facility] previously for colonoscopy, which she believes was normal.        Past Medical History:  Diagnosis Date  . Adenomatous colon polyp   . Breast cancer ([place]) 2005   left  . COPD (chronic obstructive pulmonary disease) (HCC)    hx of tobacco use  . Coronary artery spasm ([place])   . Depression   . GERD (gastroesophageal reflux disease)   . Hyperlipemia   . IDA (iron deficiency anemia)    last iron infusion 4 months ago  . Obesity   . Osteoporosis   . 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 Prediabetes      hx of  . Rash    under right breast and groin line using nizoral ointment to q day per dermatology saw few weeks ago  . Stress incontinence wers depends  . Stroke [facility]) 2013   paralytic syndrome of dominant right side  . Toe fracture, left     Patient Active Problem List   Diagnosis Date Noted  . GI bleed 11/07/2019  . S/P laparoscopic cholecystectomy 12/10/2017  . Transient speech disturbance   . Stenosis of both middle cerebral arteries   . History of stroke   . Essential hypertension   . TIA (transient ischemic attack) 09/17/2017  . Acute cholecystitis 07/12/2017  . Appendicitis 05/10/2017  . DOE (dyspnea on exertion) 04/22/2017  . Anemia 12/02/2015  . Ovarian mass, left 11/04/2015  . Pelvic mass in female 11/04/2015  . Iron deficiency anemia 04/28/2015  . Nonspecific abnormal finding in stool contents 04/06/2015  . Anemia, iron deficiency 04/06/2015  . Encounter for long-term (current) use of antiplatelets/antithrombotics 04/06/2015  . Hyperlipidemia 11/24/2014  . Hemiparesis affecting right side as late effect of cerebrovascular accident ([place]) 11/24/2014  . Coronary artery spasm ([place]) 10/09/2013  . 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 DM (diabetes mellitus), type 2, uncontrolled with complications ([place]) 95/28/4132  . Gastroesophageal reflux disease without esophagitis 10/09/2013  .  Centrilobular emphysema ([place]) 10/09/2013    Past Surgical History:  Procedure Laterality Date  . ABDOMINAL HYSTERECTOMY  1989   complete  . CHOLECYSTECTOMY N/A 12/10/2017   Procedure: LAPAROSCOPIC CHOLECYSTECTOMY WITH INTRAOPERATIVE  ERAS PATHWAY;  Surgeon: Johnathan Hausen, MD;  Location: [facility] ORS;  Service: General;  Laterality: N/A;  . COLONOSCOPY WITH PROPOFOL N/A 12/03/2015   Procedure: COLONOSCOPY WITH PROPOFOL;  Surgeon: Mauri Pole, MD;  Location: [facility] ENDOSCOPY;  Service: Endoscopy;  Laterality: N/A;  tba before procdcedure  . IR CHOLANGIOGRAM EXISTING TUBE  08/08/2017  . IR  CHOLANGIOGRAM EXISTING TUBE  08/27/2017  . IR PERC CHOLECYSTOSTOMY  07/13/2017  . LAPAROSCOPIC APPENDECTOMY N/A 12/10/2017   Procedure: APPENDECTOMY LAPAROSCOPIC;  Surgeon: Johnathan Hausen, MD;  Location: [facility];  Service: General;  Laterality: N/A;  . MASTECTOMY Left 2005   1 lymph node removed  . RADIAL KERATOTOMY Bilateral   . ROBOTIC ASSISTED BILATERAL SALPINGO OOPHERECTOMY Bilateral 11/04/2015   Procedure: XI ROBOTIC ASSISTED BILATERAL SALPINGO OOPHORECTOMY;  Surgeon: Everitt Amber, MD;  Location: [facility];  Service: Gynecology;  Laterality: Bilateral;  . TONSILLECTOMY       OB History   No obstetric history on file.     Family History  Problem Relation Age of Onset  . CVA Mother   . Liver cancer Mother   . Prostate cancer Father   . Stroke Maternal Grandfather   . Hypertension Sister   . Arthritis Sister   . Diabetes Sister   . Heart attack Sister   . Breast cancer Sister   . Colon polyps Sister   . Colon cancer Neg Hx   . Esophageal cancer Neg Hx   . Rectal cancer Neg Hx     Social History   Tobacco Use  . Smoking status: Former Smoker    Types: Cigarettes    Quit date: 02/17/1994    Years since quitting: 25.7  . Smokeless tobacco: Never Used  Vaping Use  . Vaping Use: Never used  Substance Use Topics  . Alcohol use: No    Alcohol/week: 0.0 standard drinks  . Drug use: No    Home Medications Prior to Admission medications   Medication Sig Start Date End Date Taking? Authorizing Provider  acetaminophen (TYLENOL) 500 MG tablet Take 500 mg by mouth daily as needed for mild pain.    Yes [provider]  aspirin EC 81 MG tablet Take 81 mg by mouth daily.   Yes [provider]  atorvastatin (LIPITOR) 40 MG tablet Take 40 mg by mouth at bedtime.  06/27/18  Yes [provider]  buPROPion (WELLBUTRIN SR) 150 MG 12 hr tablet Take 150 mg by mouth every evening.  05/18/17  Yes [provider]  calcium carbonate (OSCAL) 1500 (600 Ca) MG TABS  tablet Take 600 mg of elemental calcium by mouth daily.   Yes [provider]  clopidogrel (PLAVIX) 75 MG tablet Take 1 tablet (75 mg total) by mouth at bedtime. 11/07/15  Yes Cross, Melissa D, NP  ezetimibe (ZETIA) 10 MG tablet Take 10 mg by mouth at bedtime.    Yes [provider]  isosorbide mononitrate (IMDUR) 60 MG 24 hr tablet TAKE 1 TABLET BY MOUTH EVERY DAY Patient taking differently: Take 60 mg by mouth daily.  07/14/19  Yes Belva Crome, MD  nitroGLYCERIN (NITROSTAT) 0.4 MG SL tablet Place 1 tablet (0.4 mg total) under the tongue every 5 (five) minutes as needed for chest pain (Call 911 at 3rd dose within  15 minutes). 11/08/17  Yes Isaiah Serge, NP  RABEprazole (ACIPHEX) 20 MG tablet Take 20 mg by mouth at bedtime.  10/29/14  Yes [provider]  sertraline (ZOLOFT) 100 MG tablet Take 100 mg by mouth at bedtime.  08/16/17  Yes [provider]  tiotropium (SPIRIVA HANDIHALER) 18 MCG inhalation capsule Place 18 mcg into inhaler and inhale every evening.    Yes [provider]  hydrocortisone (ANUSOL-HC) 25 MG suppository Place 1 suppository (25 mg total) rectally 2 (two) times daily as needed for hemorrhoids or anal itching. Avoid using more than 7 days at a time Patient not taking: Reported on 11/07/2019 02/07/19   Mauri Pole, MD    Allergies    Ceclor [cefaclor]  Review of Systems   Review of Systems  Constitutional: Positive for fatigue. Negative for chills and fever.  HENT: Negative.   Respiratory: Positive for shortness of breath. Negative for cough.   Cardiovascular: Negative for chest pain.  Gastrointestinal: Negative for abdominal pain, blood in stool, constipation, diarrhea, nausea and vomiting.  Genitourinary: Negative for dysuria and frequency.  Musculoskeletal: Negative for arthralgias and myalgias.  Skin: Negative for color change and rash.  Neurological: Negative for syncope, weakness, light-headedness and numbness.      Physical Exam Updated Vital Signs BP 124/70 (BP Location: Left Arm)   Pulse 83   Temp 97.8 F (36.6 C) (Oral)   Resp 16   SpO2 97%   Physical Exam Vitals and nursing note reviewed.  Constitutional:      General: She is not in acute distress.    Appearance: Normal appearance. She is well-developed. She is not diaphoretic.     Comments: Elderly female, well-appearing and in no distress  HENT:     Head: Normocephalic and atraumatic.  Eyes:     General:        Right eye: No discharge.        Left eye: No discharge.     Pupils: Pupils are equal, round, and reactive to light.  Cardiovascular:     Rate and Rhythm: Normal rate and regular rhythm.     Heart sounds: Normal heart sounds. No murmur heard.  No friction rub. No gallop.   Pulmonary:     Effort: Pulmonary effort is normal. No respiratory distress.     Breath sounds: Normal breath sounds. No wheezing or rales.     Comments: Respirations equal and unlabored, patient able to speak in full sentences, lungs clear to auscultation bilaterally Abdominal:     General: Bowel sounds are normal. There is no distension.     Palpations: Abdomen is soft. There is no mass.     Tenderness: There is no abdominal tenderness. There is no guarding.     Comments: Abdomen soft, nondistended, nontender to palpation in all quadrants without guarding or peritoneal signs  Genitourinary:    Comments: Chaperone present during rectal exam. No external hemorrhoids or lesions noted On exam there is minimal stool noted in the rectal vault, no melena or bright red blood noted Musculoskeletal:        General: No deformity.     Cervical back: Neck supple.  Skin:    General: Skin is warm and dry.     Capillary Refill: Capillary refill takes less than 2 seconds.  Neurological:     Mental Status: She is alert.     Coordination: Coordination normal.     Comments: Speech is clear, able to follow commands Moves extremities without  ataxia, coordination  intact  Psychiatric:        Mood and Affect: Mood normal.        Behavior: Behavior normal.     ED Results / Procedures / Treatments   Labs (all labs ordered are listed, but only abnormal results are displayed) Labs Reviewed  POC OCCULT BLOOD, ED - Abnormal; Notable for the following components:      Result Value   Fecal Occult Bld POSITIVE (*)    All other components within normal limits  SARS CORONAVIRUS 2 BY RT PCR (HOSPITAL ORDER, [facility])  COMPREHENSIVE METABOLIC PANEL  CBC  VITAMIN B12  FOLATE  IRON AND TIBC  FERRITIN  RETICULOCYTES  POC OCCULT BLOOD, ED  TYPE AND SCREEN  PREPARE RBC (CROSSMATCH)    EKG None  Radiology [place] EKG SITE RITE  Result Date: 11/07/2019 If Site Rite image not attached, placement could not be confirmed due to current cardiac rhythm.   Procedures .Critical Care Performed by: Jacqlyn Larsen, PA-C Authorized by: Jacqlyn Larsen, PA-C   Critical care provider statement:    Critical care time (minutes):  45   Critical care was necessary to treat or prevent imminent or life-threatening deterioration of the following conditions:  Circulatory failure (Anemia requiring transfusion)   Critical care was time spent personally by me on the following activities:  Discussions with consultants, evaluation of patient's response to treatment, examination of patient, ordering and performing treatments and interventions, ordering and review of laboratory studies, ordering and review of radiographic studies, pulse oximetry, re-evaluation of patient's condition, obtaining history from patient or surrogate and review of old charts   (including critical care time)  Medications Ordered in ED Medications  0.9 %  sodium chloride infusion (has no administration in time range)  pantoprazole (PROTONIX) 80 mg in sodium chloride 0.9 % 100 mL IVPB (has no administration in time range)    ED Course  I have reviewed the triage vital signs  and the nursing notes.  Pertinent labs & imaging results that were available during my care of the patient were reviewed by me and considered in my medical decision making (see chart for details).    MDM Rules/Calculators/A&P                         74 year old female presents with low hemoglobin noted on routine blood work from PCPs office, she has been symptomatic with fatigue and dyspnea on exertion, no chest pain.  Additional history obtained from medical record and patient's husband.  On arrival patient with normal vitals, overall well-appearing and in no distress.  Went to [facility] yesterday after PCP called, and had a blood work which confirmed hemoglobin of 6.0, no other significant lab derangements, but due to long wait patient left prior to being seen.  History of iron deficiency anemia, not taking iron regularly, no known bleeding symptoms.  Will repeat lab work, type and screen, and will collect Hemoccult.  No gross blood or melena on exam but Hemoccult is positive, patient is on aspirin and Plavix, no other blood thinners, suspect slow chronic occult GI bleeding.  Will order 1 unit transfusion, since there is no significant stool present, to say for sure that there is no melena present so will give 1 dose of Protonix.  Additional lab work and anemia panel ordered and pending, will consult for medicine admission given significant anemia requiring transfusion.  Case discussed with Dr.  Gonfa with [facility] hospitalist who will see and admit the patient.  Requests GI consult.  Case discussed with Dr. Hilarie Fredrickson with full of our GI, will see patient in consult.  Final Clinical Impression(s) / ED Diagnoses Final diagnoses:  Symptomatic anemia  Occult GI bleeding    Rx / DC Orders ED Discharge Orders    None       Jacqlyn Larsen, PA-C 11/07/19 [place], [place], DO 11/07/19 1324

## 2019-11-07 NOTE — Consult Note (Signed)
Referring Provider: Dr. Cyndia Skeeters Primary Care Physician:  Velna Hatchet, MD Primary Gastroenterologist:  Dr. Silverio Decamp  Reason for Consultation: Anemia, heme positive stool  HPI: Brandy Paul is a 74 y.o. 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  Past cholecystectomy. She underwent routine laboratory studies by her PCP on 11/04/2019 which showed a a hemoglobin level of 6.2.  He was advised to present to the local emergency room for further evaluation.  She presented to [facility] but waited for 8 hours without being seen.  She presented to [facility] morning for further evaluation.  Labs in the ED showed a hemoglobin level 6.0.  Hematocrit 24.2.  BUN 14.  Creatinine 0.95.  ER physician documented brown heme positive stool on exam.  She is on Plavix having a stroke in 2013.  One unit of packed red blood cells have been ordered but not yet transfused as the patient has poor IV access.  A PICC line to be placed.  A GI consult was requested for further evaluation.  She denies having any nausea or vomiting.  No upper or lower abdominal pain.  She reports passing a normal brown formed bowel movement most days.  If she strains and passes a difficult bowel movement she will see a small amount of bright red blood on the toilet tissue which occurs once every few months.  No melena.  She last took Plavix and aspirin at 11 PM 11/06/2019.  She denies any other NSAID use.  No alcohol use.  No prior history of ulcers or GI bleed.  Her husband stated she has been taking oral iron since February 2021.  However, she has not taking the iron for the past 4 weeks. Her husband stated she has not been evaluated by hematologist. History of GERD. She has never had an EGD. She takes Rabeprazole 20mg   daily. She has a history of colon polyps.  Her most recent colonoscopy was 12/03/2015 which was done due to a positive Cologuard test and IDA.  Two tubular adenomatous colon polyps were removed.   She was last seen in our office by Dr. Silverio Decamp on 02/07/2019 to discuss a surveillance colonoscopy secondary to rectal bleeding.  At that time, her multiple comorbidities it would be best to admit her to the hospital to facilitate her bowel prep then proceed with a colonoscopy.  However, due to the Covid endemic her colonoscopy was deferred for 6 months.  Apparently the colonoscopy was rescheduled then once again canceled due the Covid pandemic.  She was treated with Anusol suppositories as her rectal bleeding was most likely due to internal hemorrhoids.  No family history of gastric or colon cancer.  Mother had liver cancer.   Labs 11/06/2019: Sodium 137.  Potassium 4.1.  Glucose 120.  BUN 14.  Creatinine 0.95.  Calcium 8.9.  WBC 6.3.  Hemoglobin 6.0.  Hematocrit 24.2.  MCV 64.4.  Platelet 276.  Labs 11/07/2019: WBC 5.6.  Hemoglobin 5.9.  Hematocrit 23.0.  MCV 64.2.  Platelet pending.  FOBT positive.  SARS coronavirus not yet completed.  Colonoscopy 12/03/2015 done due to IDA and a + Cologuard Test: - One 15 mm TA polyp in the descending colon, removed with a hot snare. Resected and retrieved. - One 3 mm TA polyp in the transverse colon, removed  with a cold biopsy forceps. Resected and retrieved. - Diverticulosis in the sigmoid colon and in the descending colon. - Non-bleeding internal hemorrhoids.   Past Medical History:  Diagnosis Date  . Adenomatous colon polyp   . Breast cancer ([place]) 2005   left  . COPD (chronic obstructive pulmonary disease) ([facility])    hx of tobacco use  . Coronary artery spasm ([place])   . Depression   . GERD (gastroesophageal reflux disease)   . Hyperlipemia   . IDA (iron deficiency anemia)    last iron infusion 4 months ago  . Obesity   . Osteoporosis   . 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)   . Prediabetes    hx of  . Rash    under right breast and groin line using nizoral ointment to q day per dermatology saw few weeks ago  . Stress incontinence wers depends  . Stroke [facility]) 2013   paralytic syndrome of dominant right side  . Toe fracture, left     Past Surgical History:  Procedure Laterality Date  . ABDOMINAL HYSTERECTOMY  1989   complete  . CHOLECYSTECTOMY N/A 12/10/2017   Procedure: LAPAROSCOPIC CHOLECYSTECTOMY WITH INTRAOPERATIVE  ERAS PATHWAY;  Surgeon: Johnathan Hausen, MD;  Location: [facility] ORS;  Service: General;  Laterality: N/A;  . COLONOSCOPY WITH PROPOFOL N/A 12/03/2015   Procedure: COLONOSCOPY WITH PROPOFOL;  Surgeon: Mauri Pole, MD;  Location: [facility] ENDOSCOPY;  Service: Endoscopy;  Laterality: N/A;  tba before procdcedure  . IR CHOLANGIOGRAM EXISTING TUBE  08/08/2017  . IR CHOLANGIOGRAM EXISTING TUBE  08/27/2017  . IR PERC CHOLECYSTOSTOMY  07/13/2017  . LAPAROSCOPIC APPENDECTOMY N/A 12/10/2017   Procedure: APPENDECTOMY LAPAROSCOPIC;  Surgeon: Johnathan Hausen, MD;  Location: [facility] ORS;  Service: General;  Laterality: N/A;  . MASTECTOMY Left 2005   1 lymph node removed  . RADIAL KERATOTOMY Bilateral   . ROBOTIC ASSISTED BILATERAL SALPINGO OOPHERECTOMY Bilateral 11/04/2015   Procedure: XI ROBOTIC ASSISTED BILATERAL SALPINGO OOPHORECTOMY;  Surgeon: Everitt Amber, MD;  Location: [facility] ORS;  Service: Gynecology;  Laterality: Bilateral;  . TONSILLECTOMY      Prior to Admission medications   Medication Sig Start Date End Date Taking? Authorizing Provider  acetaminophen (TYLENOL) 500 MG tablet Take 500 mg by mouth daily as needed for mild pain.    Yes [provider]  aspirin EC 81 MG tablet Take 81 mg by mouth daily.   Yes [provider]  atorvastatin (LIPITOR) 40 MG tablet Take 40 mg by mouth at bedtime.  06/27/18  Yes [provider]  buPROPion (WELLBUTRIN SR) 150 MG 12 hr tablet Take 150 mg by mouth every evening.  05/18/17   Yes [provider]  calcium carbonate (OSCAL) 1500 (600 Ca) MG TABS tablet Take 600 mg of elemental calcium by mouth daily.   Yes [provider]  clopidogrel (PLAVIX) 75 MG tablet Take 1 tablet (75 mg total) by mouth at bedtime. 11/07/15  Yes Cross, Melissa D, NP  ezetimibe (ZETIA) 10 MG tablet Take 10 mg by mouth at bedtime.    Yes [provider]  isosorbide mononitrate (IMDUR) 60 MG 24 hr tablet TAKE 1 TABLET BY MOUTH EVERY DAY Patient taking differently: Take 60 mg by mouth daily.  07/14/19  Yes Belva Crome, MD  nitroGLYCERIN (NITROSTAT) 0.4 MG SL tablet Place 1 tablet (0.4 mg total) under the tongue every 5 (five) minutes as needed for chest pain (Call 911 at 3rd dose within 15 minutes). 11/08/17  Yes  Isaiah Serge, NP  RABEprazole (ACIPHEX) 20 MG tablet Take 20 mg by mouth at bedtime.  10/29/14  Yes [provider]  sertraline (ZOLOFT) 100 MG tablet Take 100 mg by mouth at bedtime.  08/16/17  Yes [provider]  tiotropium (SPIRIVA HANDIHALER) 18 MCG inhalation capsule Place 18 mcg into inhaler and inhale every evening.    Yes [provider]  hydrocortisone (ANUSOL-HC) 25 MG suppository Place 1 suppository (25 mg total) rectally 2 (two) times daily as needed for hemorrhoids or anal itching. Avoid using more than 7 days at a time Patient not taking: Reported on 11/07/2019 02/07/19   Mauri Pole, MD    Current Facility-Administered Medications  Medication Dose Route Frequency Provider Last Rate Last Admin  . 0.9 %  sodium chloride infusion  10 mL/hr Intravenous Once Ford, Kelsey N, PA-C      . pantoprazole (PROTONIX) 80 mg in sodium chloride 0.9 % 100 mL IVPB  80 mg Intravenous Once Jacqlyn Larsen, [place]       Current Outpatient Medications  Medication Sig Dispense Refill  . acetaminophen (TYLENOL) 500 MG tablet Take 500 mg by mouth daily as needed for mild pain.     Marland Kitchen aspirin EC 81 MG tablet Take 81 mg by mouth daily.    Marland Kitchen  atorvastatin (LIPITOR) 40 MG tablet Take 40 mg by mouth at bedtime.     Marland Kitchen buPROPion (WELLBUTRIN SR) 150 MG 12 hr tablet Take 150 mg by mouth every evening.   3  . calcium carbonate (OSCAL) 1500 (600 Ca) MG TABS tablet Take 600 mg of elemental calcium by mouth daily.    . clopidogrel (PLAVIX) 75 MG tablet Take 1 tablet (75 mg total) by mouth at bedtime.    Marland Kitchen ezetimibe (ZETIA) 10 MG tablet Take 10 mg by mouth at bedtime.     . isosorbide mononitrate (IMDUR) 60 MG 24 hr tablet TAKE 1 TABLET BY MOUTH EVERY DAY (Patient taking differently: Take 60 mg by mouth daily. ) 90 tablet 2  . nitroGLYCERIN (NITROSTAT) 0.4 MG SL tablet Place 1 tablet (0.4 mg total) under the tongue every 5 (five) minutes as needed for chest pain (Call 911 at 3rd dose within 15 minutes). 25 tablet 3  . RABEprazole (ACIPHEX) 20 MG tablet Take 20 mg by mouth at bedtime.   2  . sertraline (ZOLOFT) 100 MG tablet Take 100 mg by mouth at bedtime.   3  . tiotropium (SPIRIVA HANDIHALER) 18 MCG inhalation capsule Place 18 mcg into inhaler and inhale every evening.     . hydrocortisone (ANUSOL-HC) 25 MG suppository Place 1 suppository (25 mg total) rectally 2 (two) times daily as needed for hemorrhoids or anal itching. Avoid using more than 7 days at a time (Patient not taking: Reported on 11/07/2019) 14 suppository 1    Allergies as of 11/07/2019 - Review Complete 11/07/2019  Allergen Reaction Noted  . Ceclor [cefaclor] Other (See Comments) 03/10/2011    Family History  Problem Relation Age of Onset  . CVA Mother   . Liver cancer Mother   . Prostate cancer Father   . Stroke Maternal Grandfather   . Hypertension Sister   . Arthritis Sister   . Diabetes Sister   . Heart attack Sister   . Breast cancer Sister   . Colon polyps Sister   . Colon cancer Neg Hx   . Esophageal cancer Neg Hx   . Rectal cancer Neg Hx     Social  History   Socioeconomic History  . Marital status: Married    Spouse name: [name]  . Number of children: 4   . Years of education: 6  . Highest education level: Not on file  Occupational History  . Occupation: retired  Tobacco Use  . Smoking status: Former Smoker    Types: Cigarettes    Quit date: 02/17/1994    Years since quitting: 25.7  . Smokeless tobacco: Never Used  Vaping Use  . Vaping Use: Never used  Substance and Sexual Activity  . Alcohol use: No    Alcohol/week: 0.0 standard drinks  . Drug use: No  . Sexual activity: Not Currently  Other Topics Concern  . Not on file  Social History Narrative   Lives with spouse   1-2 cups coffee   Social Determinants of Health   Financial Resource Strain:   . Difficulty of Paying Living Expenses: Not on file  Food Insecurity:   . Worried About Charity fundraiser in the Last Year: Not on file  . Ran Out of Food in the Last Year: Not on file  Transportation Needs:   . Lack of Transportation (Medical): Not on file  . Lack of Transportation (Non-Medical): Not on file  Physical Activity:   . Days of Exercise per Week: Not on file  . Minutes of Exercise per Session: Not on file  Stress:   . Feeling of Stress : Not on file  Social Connections:   . Frequency of Communication with Friends and Family: Not on file  . Frequency of Social Gatherings with Friends and Family: Not on file  . Attends Religious Services: Not on file  . Active Member of Clubs or Organizations: Not on file  . Attends Archivist Meetings: Not on file  . Marital Status: Not on file  Intimate Partner Violence:   . Fear of Current or Ex-Partner: Not on file  . Emotionally Abused: Not on file  . Physically Abused: Not on file  . Sexually Abused: Not on file    Review of Systems: See HPI, all other systems reviewed and are negative  Physical Exam: Vital signs in last 24 hours: Temp:  [97.8 F (36.6 C)-98.6 F (37 C)] 98.6 F (37 C) (08/20 0943) Pulse Rate:  [72-85] 85 (08/20 1023) Resp:  [16-20] 20 (08/20 1023) BP: (119-166)/(39-70) 144/42 (08/20  1023) SpO2:  [96 %-100 %] 98 % (08/20 1023) Weight:  [87.1 kg] 87.1 kg (08/19 1307)   General: 74 year old female alert in no acute distress. Head:  Normocephalic and atraumatic. Eyes:  No scleral icterus. Conjunctiva pink. Ears:  Normal auditory acuity. Nose:  No deformity, discharge or lesions. Mouth: Upper and lower partial plates.  No ulcers or lesions.  Neck:  Supple. No lymphadenopathy or thyromegaly.  Lungs: Breath sounds clear throughout. Heart: Regular rate and rhythm, no murmurs. Abdomen: Soft, nontender.  Positive bowel sounds all 4 quadrants.  No mass.  No HSM. Rectal: Deferred.  Brown stool positive as reported by the ED physician Musculoskeletal:  Symmetrical without gross deformities.  Pulses:  Normal pulses noted. Extremities:  Without clubbing or edema. Neurologic:  Alert and  oriented x4. No focal deficits.  Skin:  Intact without significant lesions or rashes. Psych:  Alert and cooperative. Normal mood and affect.  Intake/Output from previous day: No intake/output data recorded. Intake/Output this shift: No intake/output data recorded.  Lab Results: Recent Labs    11/06/19 1324  WBC 6.3  HGB 6.0*  HCT 24.2*  PLT 276   BMET Recent Labs    11/06/19 1324  NA 137  K 4.1  CL 104  CO2 24  GLUCOSE 120*  BUN 14  CREATININE 0.95  CALCIUM 8.9   LFT No results for input(s): PROT, ALBUMIN, AST, ALT, ALKPHOS, BILITOT, BILIDIR, IBILI in the last 72 hours. PT/INR No results for input(s): LABPROT, INR in the last 72 hours. Hepatitis Panel No results for input(s): HEPBSAG, HCVAB, HEPAIGM, HEPBIGM in the last 72 hours.    Studies/Results: [place] EKG SITE RITE  Result Date: 11/07/2019 If [facility] image not attached, placement could not be confirmed due to current cardiac rhythm.   IMPRESSION/PLAN:  64.  74 year old female with a history of CVA on Plavix and ASA admitted to the hospital with iron deficiency anemia.  Initial hemoglobin 5.9.  No overt GI  bleeding.  FOBT positive.  One unit of packed red blood cells ordered but not yet transfused as patient has poor IV access.  PICC line to be placed.  Dose of Plavix and aspirin were taken at 11 PM on 8/19. -Clear liquid diet -Eventual EGD +/- colonoscopy and small bowel capsule endoscopy after Plavix washout -Monitor H&H closely -Check iron panel to blood transfusion if not already done -Repeat H&H post transfusion -Pantoprazole 40 mg IV twice daily  2. History of GERD -See plan in # 1  3.  History of colon polyp -Eventual colonoscopy  4. COPD, stable  5. History of MI x 3 secondary to coronary spasm  Further recommendations per Dr. Maurine Minister Dorathy Daft  11/07/2019, 10:58 AM

## 2019-11-07 NOTE — Progress Notes (Signed)
Peripherally Inserted Central Catheter Placement  The IV Nurse has discussed with the patient and/or persons authorized to consent for the patient, the purpose of this procedure and the potential benefits and risks involved with this procedure.  The benefits include less needle sticks, lab draws from the catheter, and the patient may be discharged home with the catheter. Risks include, but not limited to, infection, bleeding, blood clot (thrombus formation), and puncture of an artery; nerve damage and irregular heartbeat and possibility to perform a PICC exchange if needed/ordered by physician.  Alternatives to this procedure were also discussed.  Bard Power PICC patient education guide, fact sheet on infection prevention and patient information card has been provided to patient /or left at bedside.    PICC Placement Documentation  PICC Double Lumen 11/07/19 PICC Left Brachial 39 cm 1 cm (Active)  Indication for Insertion or Continuance of Line Poor Vasculature-patient has had multiple peripheral attempts or PIVs lasting less than 24 hours 11/07/19 1522  Exposed Catheter (cm) 0 cm 11/07/19 1522  Site Assessment Clean;Dry;Intact 11/07/19 1522  Lumen #1 Status Flushed;Blood return noted 11/07/19 1522  Lumen #2 Status Flushed;Blood return noted 11/07/19 1522  Dressing Type Transparent 11/07/19 1522  Dressing Status Clean;Dry;Intact;Antimicrobial disc in place;Other (Comment) 11/07/19 1522  Dressing Intervention New dressing 11/07/19 1522  Dressing Change Due 11/14/19 11/07/19 1522   Husband signed consent at bedside    [name] 11/07/2019, 3:23 PM

## 2019-11-07 NOTE — ED Notes (Signed)
Date and time results received: 11/07/19 11:08 AM (use smartphrase ".now" to insert current time)  Test: hemoglobin Critical Value: 5.9  Name of Provider Notified: Cyndia Skeeters

## 2019-11-08 DIAGNOSIS — J9690 Respiratory failure, unspecified, unspecified whether with hypoxia or hypercapnia: Secondary | ICD-10-CM | POA: Diagnosis not present

## 2019-11-08 DIAGNOSIS — Z833 Family history of diabetes mellitus: Secondary | ICD-10-CM | POA: Diagnosis not present

## 2019-11-08 DIAGNOSIS — K219 Gastro-esophageal reflux disease without esophagitis: Secondary | ICD-10-CM | POA: Diagnosis not present

## 2019-11-08 DIAGNOSIS — I69320 Aphasia following cerebral infarction: Secondary | ICD-10-CM | POA: Diagnosis not present

## 2019-11-08 DIAGNOSIS — Z90710 Acquired absence of both cervix and uterus: Secondary | ICD-10-CM | POA: Diagnosis not present

## 2019-11-08 DIAGNOSIS — J449 Chronic obstructive pulmonary disease, unspecified: Secondary | ICD-10-CM

## 2019-11-08 DIAGNOSIS — Z8042 Family history of malignant neoplasm of prostate: Secondary | ICD-10-CM | POA: Diagnosis not present

## 2019-11-08 DIAGNOSIS — J90 Pleural effusion, not elsewhere classified: Secondary | ICD-10-CM | POA: Diagnosis not present

## 2019-11-08 DIAGNOSIS — Z800 Family history of malignant neoplasm of digestive organs: Secondary | ICD-10-CM | POA: Diagnosis not present

## 2019-11-08 DIAGNOSIS — Z7902 Long term (current) use of antithrombotics/antiplatelets: Secondary | ICD-10-CM | POA: Diagnosis not present

## 2019-11-08 DIAGNOSIS — K2960 Other gastritis without bleeding: Secondary | ICD-10-CM | POA: Diagnosis not present

## 2019-11-08 DIAGNOSIS — F329 Major depressive disorder, single episode, unspecified: Secondary | ICD-10-CM | POA: Diagnosis not present

## 2019-11-08 DIAGNOSIS — Z6834 Body mass index (BMI) 34.0-34.9, adult: Secondary | ICD-10-CM | POA: Diagnosis not present

## 2019-11-08 DIAGNOSIS — I69351 Hemiplegia and hemiparesis following cerebral infarction affecting right dominant side: Secondary | ICD-10-CM | POA: Diagnosis not present

## 2019-11-08 DIAGNOSIS — K2961 Other gastritis with bleeding: Secondary | ICD-10-CM | POA: Diagnosis not present

## 2019-11-08 DIAGNOSIS — K3189 Other diseases of stomach and duodenum: Secondary | ICD-10-CM | POA: Diagnosis not present

## 2019-11-08 DIAGNOSIS — D500 Iron deficiency anemia secondary to blood loss (chronic): Secondary | ICD-10-CM | POA: Diagnosis not present

## 2019-11-08 DIAGNOSIS — Z8249 Family history of ischemic heart disease and other diseases of the circulatory system: Secondary | ICD-10-CM | POA: Diagnosis not present

## 2019-11-08 DIAGNOSIS — Z803 Family history of malignant neoplasm of breast: Secondary | ICD-10-CM | POA: Diagnosis not present

## 2019-11-08 DIAGNOSIS — Z20822 Contact with and (suspected) exposure to covid-19: Secondary | ICD-10-CM | POA: Diagnosis not present

## 2019-11-08 DIAGNOSIS — Z9049 Acquired absence of other specified parts of digestive tract: Secondary | ICD-10-CM | POA: Diagnosis not present

## 2019-11-08 DIAGNOSIS — K6289 Other specified diseases of anus and rectum: Secondary | ICD-10-CM | POA: Diagnosis not present

## 2019-11-08 DIAGNOSIS — Z8261 Family history of arthritis: Secondary | ICD-10-CM | POA: Diagnosis not present

## 2019-11-08 DIAGNOSIS — Z823 Family history of stroke: Secondary | ICD-10-CM | POA: Diagnosis not present

## 2019-11-08 DIAGNOSIS — K5731 Diverticulosis of large intestine without perforation or abscess with bleeding: Secondary | ICD-10-CM | POA: Diagnosis not present

## 2019-11-08 DIAGNOSIS — Z853 Personal history of malignant neoplasm of breast: Secondary | ICD-10-CM | POA: Diagnosis not present

## 2019-11-08 DIAGNOSIS — K5730 Diverticulosis of large intestine without perforation or abscess without bleeding: Secondary | ICD-10-CM | POA: Diagnosis not present

## 2019-11-08 DIAGNOSIS — M810 Age-related osteoporosis without current pathological fracture: Secondary | ICD-10-CM | POA: Diagnosis present

## 2019-11-08 DIAGNOSIS — Z9012 Acquired absence of left breast and nipple: Secondary | ICD-10-CM | POA: Diagnosis not present

## 2019-11-08 DIAGNOSIS — I700 Atherosclerosis of aorta: Secondary | ICD-10-CM | POA: Diagnosis not present

## 2019-11-08 DIAGNOSIS — E785 Hyperlipidemia, unspecified: Secondary | ICD-10-CM | POA: Diagnosis present

## 2019-11-08 DIAGNOSIS — D649 Anemia, unspecified: Secondary | ICD-10-CM | POA: Diagnosis not present

## 2019-11-08 DIAGNOSIS — D509 Iron deficiency anemia, unspecified: Secondary | ICD-10-CM | POA: Diagnosis not present

## 2019-11-08 DIAGNOSIS — F32A Depression, unspecified: Secondary | ICD-10-CM

## 2019-11-08 DIAGNOSIS — Z7982 Long term (current) use of aspirin: Secondary | ICD-10-CM | POA: Diagnosis not present

## 2019-11-08 DIAGNOSIS — I10 Essential (primary) hypertension: Secondary | ICD-10-CM | POA: Diagnosis not present

## 2019-11-08 DIAGNOSIS — K922 Gastrointestinal hemorrhage, unspecified: Secondary | ICD-10-CM | POA: Diagnosis not present

## 2019-11-08 DIAGNOSIS — K449 Diaphragmatic hernia without obstruction or gangrene: Secondary | ICD-10-CM | POA: Diagnosis not present

## 2019-11-08 LAB — BASIC METABOLIC PANEL
Anion gap: 9 (ref 5–15)
BUN: 15 mg/dL (ref 8–23)
CO2: 26 mmol/L (ref 22–32)
Calcium: 8.6 mg/dL — ABNORMAL LOW (ref 8.9–10.3)
Chloride: 106 mmol/L (ref 98–111)
Creatinine, Ser: 0.94 mg/dL (ref 0.44–1.00)
GFR calc Af Amer: >60 mL/min (ref >60)
GFR calc non Af Amer: 60 mL/min — ABNORMAL LOW (ref >60)
Glucose, Bld: 95 mg/dL (ref 70–99)
Potassium: 4.1 mmol/L (ref 3.5–5.1)
Sodium: 141 mmol/L (ref 135–145)

## 2019-11-08 LAB — GLUCOSE, CAPILLARY
Glucose-Capillary: 82 mg/dL (ref 70–99)
Glucose-Capillary: 88 mg/dL (ref 70–99)
Glucose-Capillary: 91 mg/dL (ref 70–99)
Glucose-Capillary: 95 mg/dL (ref 70–99)
Glucose-Capillary: 95 mg/dL (ref 70–99)

## 2019-11-08 LAB — HEMOGLOBIN AND HEMATOCRIT, BLOOD
HCT: 28 % — ABNORMAL LOW (ref 36.0–46.0)
Hemoglobin: 7.8 g/dL — ABNORMAL LOW (ref 12.0–15.0)

## 2019-11-08 LAB — PREPARE RBC (CROSSMATCH)

## 2019-11-08 LAB — CBC
HCT: 23.9 % — ABNORMAL LOW (ref 36.0–46.0)
Hemoglobin: 6.6 g/dL — CL (ref 12.0–15.0)
MCH: 17.9 pg — ABNORMAL LOW (ref 26.0–34.0)
MCHC: 27.6 g/dL — ABNORMAL LOW (ref 30.0–36.0)
MCV: 64.9 fL — ABNORMAL LOW (ref 80.0–100.0)
Platelets: 240 10*3/uL (ref 150–400)
RBC: 3.68 MIL/uL — ABNORMAL LOW (ref 3.87–5.11)
RDW: 23.5 % — ABNORMAL HIGH (ref 11.5–15.5)
WBC: 8 10*3/uL (ref 4.0–10.5)
nRBC: 0 % (ref 0.0–0.2)

## 2019-11-08 MED ORDER — PANTOPRAZOLE SODIUM 40 MG PO TBEC
40.0000 mg | DELAYED_RELEASE_TABLET | Freq: Every day | ORAL | Status: DC
  Administered 2019-11-08 – 2019-11-12 (×5): 40 mg via ORAL
  Filled 2019-11-08 (×3): qty 1

## 2019-11-08 MED ORDER — POLYETHYLENE GLYCOL 3350 17 G PO PACK
17.0000 g | PACK | Freq: Every day | ORAL | Status: DC
  Administered 2019-11-08 – 2019-11-12 (×4): 17 g via ORAL
  Filled 2019-11-08 (×4): qty 1

## 2019-11-08 MED ORDER — ADULT MULTIVITAMIN W/MINERALS CH
1.0000 | ORAL_TABLET | Freq: Every day | ORAL | Status: DC
  Administered 2019-11-08 – 2019-11-12 (×5): 1 via ORAL
  Filled 2019-11-08 (×5): qty 1

## 2019-11-08 MED ORDER — FOLIC ACID 1 MG PO TABS
1.0000 mg | ORAL_TABLET | Freq: Every day | ORAL | Status: DC
  Administered 2019-11-08 – 2019-11-12 (×5): 1 mg via ORAL
  Filled 2019-11-08 (×5): qty 1

## 2019-11-08 MED ORDER — SODIUM CHLORIDE 0.9% IV SOLUTION: Freq: Once | INTRAVENOUS | Status: AC

## 2019-11-08 MED ORDER — SENNOSIDES-DOCUSATE SODIUM 8.6-50 MG PO TABS
1.0000 | ORAL_TABLET | Freq: Two times a day (BID) | ORAL | Status: DC
  Administered 2019-11-08 – 2019-11-12 (×6): 1 via ORAL
  Filled 2019-11-08 (×6): qty 1

## 2019-11-08 MED ORDER — VITAMIN B-12 1000 MCG PO TABS
1000.0000 ug | ORAL_TABLET | Freq: Every day | ORAL | Status: DC
  Administered 2019-11-08 – 2019-11-12 (×5): 1000 ug via ORAL
  Filled 2019-11-08 (×5): qty 1

## 2019-11-08 NOTE — Progress Notes (Signed)
PROGRESS NOTE    Brandy Paul   WJX:[id number]  DOB: [birth year]  DOA: 11/07/2019     0  PCP: Velna Hatchet, MD  CC: low hemoglobin  Hospital Course: [name] is a 74 year old Caucasian female with PMH colon polyps, internal hemorrhoids, iron deficiency anemia, CAD, CVA (residual right-sided weakness), COPD who presented to the ER after routine blood work revealed significant anemia.  She was referred to the ER for transfusion and further work-up. Hemoglobin was 6 g/dL on arrival with heme positive stools. She follows outpatient with GI, last seen 02/07/2019.  At that time, discussions were being held to schedule her surveillance colonoscopy.  This was postponed in the setting of the COVID-19 pandemic. PRBC was ordered for transfusion on admission.  GI was consulted and she is tentatively scheduled for EGD and colonoscopy on 11/11/2019 after aspirin and Plavix washout.    Interval History:  Sent to hospital after outpatient blood work showed severe anemia. She denies any melena or rectal bleeding.  Husband is bedside this morning and they understand tentative plan is for awaiting Plavix washout then endoscopies on 11/11/2019.  Old records reviewed in assessment of this patient  ROS: Constitutional: negative for chills and fevers, Respiratory: negative for cough, Cardiovascular: negative for chest pain and Gastrointestinal: negative for abdominal pain  Assessment & Plan: GI bleed - FOBT positive; no obvious bleeding; she does have history of polyps, IH however given extreme IDA I'm concerned for underlying malignancy - plan is for EGD/CLN on 8/24 after plavix washout; depending on results may need to discuss with neuro about restarting plavix - repeat Hgb after PRBC - trend H/H - GI following, appreciate assistance   CVA, old, aphasia -Residual right-sided deficits.  Able to ambulate with a walker at home but mostly wheelchair-bound.  Cared for by her husband -Follows  outpatient with neurology -On aspirin and Plavix at home.  Currently on hold in anticipation of upcoming endoscopy procedures  DM (diabetes mellitus), type 2, uncontrolled with complications ([place]) -Continue SSI and CBGs  Anemia, iron deficiency -Iron stores checked and are severely low -Received INFeD infusion on 11/07/2019 -May still need oral iron at discharge  COPD (chronic obstructive pulmonary disease) ([place]) - no s/s exacerbation - continue duonebs and incruse ellipta  Essential hypertension - BP remains controlled - continue holding BP meds  Depression - continue wellbutrin and zoloft   Antimicrobials: None  DVT prophylaxis: SCDs Code Status: Full Family Communication: Husband bedside Disposition Plan: Home after endoscopies if stable Status is: Inpatient  Remains inpatient appropriate because:Hemodynamically unstable, Ongoing diagnostic testing needed not appropriate for outpatient work up, Unsafe d/c plan and Inpatient level of care appropriate due to severity of illness   Dispo: The patient is from: Home              Anticipated d/c is to: Home              Anticipated d/c date is: > 3 days              Patient currently is not medically stable to d/c.       Objective: Blood pressure (!) 128/46, pulse 95, temperature 99.1 F (37.3 C), temperature source Oral, resp. rate 18, SpO2 93 %.  Examination: General appearance: alert, cooperative and no distress Head: Normocephalic, without obvious abnormality, atraumatic Eyes: EOMI Lungs: clear to auscultation bilaterally Heart: regular rate and rhythm and S1, S2 normal Abdomen: normal findings: bowel sounds normal and soft, non-tender Extremities: No edema.  Right hand noted with clenched fist Skin: mobility and turgor normal Neurologic: Right upper extremity strength 2-3/5.  Left upper extremity 5/5.  Right lower extremity 4/5.  Left lower extremity 5/5.  Sensation intact bilaterally  throughout  Consultants:   GI  Procedures:   None  Data Reviewed: I have personally reviewed following labs and imaging studies Results for orders placed or performed during the hospital encounter of 11/07/19 (from the past 24 hour(s))  Glucose, capillary     Status: None   Collection Time: 11/07/19  5:13 PM  Result Value Ref Range   Glucose-Capillary 84 70 - 99 mg/dL  Glucose, capillary     Status: None   Collection Time: 11/07/19  8:00 PM  Result Value Ref Range   Glucose-Capillary 91 70 - 99 mg/dL   Comment 1 Notify RN    Comment 2 Document in Chart   Glucose, capillary     Status: None   Collection Time: 11/08/19 12:42 AM  Result Value Ref Range   Glucose-Capillary 82 70 - 99 mg/dL   Comment 1 Notify RN    Comment 2 Document in Chart   Basic metabolic panel     Status: Abnormal   Collection Time: 11/08/19  5:22 AM  Result Value Ref Range   Sodium 141 135 - 145 mmol/L   Potassium 4.1 3.5 - 5.1 mmol/L   Chloride 106 98 - 111 mmol/L   CO2 26 22 - 32 mmol/L   Glucose, Bld 95 70 - 99 mg/dL   BUN 15 8 - 23 mg/dL   Creatinine, Ser 0.94 0.44 - 1.00 mg/dL   Calcium 8.6 (L) 8.9 - 10.3 mg/dL   GFR calc non Af Amer 60 (L) >60 mL/min   GFR calc Af Amer >60 >60 mL/min   Anion gap 9 5 - 15  CBC     Status: Abnormal   Collection Time: 11/08/19  5:22 AM  Result Value Ref Range   WBC 8.0 4.0 - 10.5 K/uL   RBC 3.68 (L) 3.87 - 5.11 MIL/uL   Hemoglobin 6.6 (LL) 12.0 - 15.0 g/dL   HCT 23.9 (L) 36 - 46 %   MCV 64.9 (L) 80.0 - 100.0 fL   MCH 17.9 (L) 26.0 - 34.0 pg   MCHC 27.6 (L) 30.0 - 36.0 g/dL   RDW 23.5 (H) 11.5 - 15.5 %   Platelets 240 150 - 400 K/uL   nRBC 0.0 0.0 - 0.2 %  Glucose, capillary     Status: None   Collection Time: 11/08/19  7:40 AM  Result Value Ref Range   Glucose-Capillary 95 70 - 99 mg/dL  Prepare RBC (crossmatch)     Status: None   Collection Time: 11/08/19  9:38 AM  Result Value Ref Range   Order Confirmation      ORDER PROCESSED BY BLOOD  BANK Performed at [facility], [address] [place], [place] [address]   Glucose, capillary     Status: None   Collection Time: 11/08/19 11:46 AM  Result Value Ref Range   Glucose-Capillary 88 70 - 99 mg/dL    Recent Results (from the past 240 hour(s))  SARS Coronavirus 2 by RT PCR (hospital order, performed in [facility] hospital lab) Nasopharyngeal Nasopharyngeal Swab     Status: None   Collection Time: 11/07/19 12:24 PM   Specimen: Nasopharyngeal Swab  Result Value Ref Range Status   SARS Coronavirus 2 NEGATIVE NEGATIVE Final    Comment: (NOTE) SARS-CoV-2 target  nucleic acids are NOT DETECTED.  The SARS-CoV-2 RNA is generally detectable in upper and lower respiratory specimens during the acute phase of infection. The lowest concentration of SARS-CoV-2 viral copies this assay can detect is 250 copies / mL. A negative result does not preclude SARS-CoV-2 infection and should not be used as the sole basis for treatment or other patient management decisions.  A negative result may occur with improper specimen collection / handling, submission of specimen other than nasopharyngeal swab, presence of viral mutation(s) within the areas targeted by this assay, and inadequate number of viral copies (<250 copies / mL). A negative result must be combined with clinical observations, patient history, and epidemiological information.  Fact Sheet for Patients:   StrictlyIdeas.no  Fact Sheet for Healthcare Providers: BankingDealers.co.za  This test is not yet approved or  cleared by the [place] FDA and has been authorized for detection and/or diagnosis of SARS-CoV-2 by FDA under an Emergency Use Authorization (EUA).  This EUA will remain in effect (meaning this test can be used) for the duration of the COVID-19 declaration under Section 564(b)(1) of the Act, 21 U.S.C. section 360bbb-3(b)(1), unless the authorization  is terminated or revoked sooner.  Performed at [facility], [place] [address], [place], [place] [address]      Radiology Studies: [place] EKG SITE RITE  Result Date: 11/07/2019 If [facility] image not attached, placement could not be confirmed due to current cardiac rhythm.  [place] EKG SITE RITE  Final Result       Scheduled Meds: . sodium chloride   Intravenous Once  . atorvastatin  40 mg Oral QHS  . buPROPion  150 mg Oral QPM  . Chlorhexidine Gluconate Cloth  6 each Topical Daily  . folic acid  1 mg Oral Daily  . insulin aspart  0-5 Units Subcutaneous QHS  . insulin aspart  0-9 Units Subcutaneous TID WC  . multivitamin with minerals  1 tablet Oral Daily  . pantoprazole  40 mg Oral Daily  . sertraline  100 mg Oral QHS  . sodium chloride flush  10-40 mL Intracatheter Q12H  . umeclidinium bromide  1 puff Inhalation Daily  . vitamin B-12  1,000 mcg Oral Daily   PRN Meds: ipratropium-albuterol, ondansetron **OR** ondansetron (ZOFRAN) IV, sodium chloride flush Continuous Infusions: . sodium chloride        LOS: 0 days  Time spent: Greater than 50% of the 35 minute visit was spent in counseling/coordination of care for the patient as laid out in the A&P.   Dwyane Dee, MD [facility] 11/08/2019, 2:39 PM   Contact via secure chat.  To contact the attending provider between 7A-7P or the covering provider during after hours 7P-7A, please log into the web site [email or web address] and access using universal [place] password for that web site. If you do not have the password, please call the hospital operator.

## 2019-11-08 NOTE — Assessment & Plan Note (Signed)
-  Iron stores checked and are severely low -Received INFeD infusion on 11/07/2019 -May still need oral iron at discharge

## 2019-11-08 NOTE — Assessment & Plan Note (Signed)
-   no s/s exacerbation - continue duonebs and incruse ellipta

## 2019-11-08 NOTE — Assessment & Plan Note (Signed)
-   BP remains controlled - continue holding BP meds

## 2019-11-08 NOTE — Assessment & Plan Note (Signed)
-  Residual right-sided deficits.  Able to ambulate with a walker at home but mostly wheelchair-bound.  Cared for by her husband -Follows outpatient with neurology -On aspirin and Plavix at home.  Currently on hold in anticipation of upcoming endoscopy procedures

## 2019-11-08 NOTE — Assessment & Plan Note (Addendum)
-   FOBT positive; no obvious bleeding; she does have history of polyps, IH however given extreme IDA I'm concerned for underlying malignancy - plan is for EGD/CLN on 8/24 after plavix washout; depending on results may need to discuss with neuro about restarting plavix - repeat Hgb after PRBC - trend H/H, will transfuse if Hgb<7 g/dL or if further obvious bleeding (had a small BM yesterday with clotted blood but no large drop in Hgb today compared to yesterday and BP remains stable) - GI following, appreciate assistance  -Hemoglobin still remaining stable, 7.8 g/dL this morning.  Repeat CBC again in a.m.  Plan remains EGD and colonoscopy on Tuesday.

## 2019-11-08 NOTE — Progress Notes (Signed)
     [facility] Progress Note  CC:  IDA, heme + stool   Subjective:  She feels well. No N/V or abdominal pain. No BM. She has tolerated a clear liquid diet. She wishes to eat. Husband at bedside.   Objective:  Vital signs in last 24 hours: Temp:  [98 F (36.7 C)-99.1 F (37.3 C)] 98.7 F (37.1 C) (08/21 0703) Pulse Rate:  [70-96] 96 (08/21 0703) Resp:  [13-20] 16 (08/21 0703) BP: (99-131)/(47-57) 122/53 (08/21 0703) SpO2:  [87 %-100 %] 92 % (08/21 0733) Last BM Date: 11/07/19 General:   Alert in NAD.  Heart: RRR, no murmur.  Pulm:  Breath sounds clear throughout.  Abdomen: Soft, nontender. No mass. No HSM. + BS x 4 quads. Extremities:  Without edema. Neurologic:  Alert and  oriented x4. Right upper and lower extremity weakness. Speech is clear.  Psych:  Alert and cooperative. Normal mood and affect.  Intake/Output from previous day: 08/20 0701 - 08/21 0700 In: 588.5 [IV Piggyback:588.5] Out: -  Intake/Output this shift: No intake/output data recorded.  Lab Results: Recent Labs    11/06/19 1324 11/07/19 1046 11/08/19 0522  WBC 6.3 5.6 8.0  HGB 6.0* 5.9* 6.6*  HCT 24.2* 23.0* 23.9*  PLT 276 265 240   BMET Recent Labs    11/06/19 1324 11/07/19 1046 11/08/19 0522  NA 137 140 141  K 4.1 4.0 4.1  CL 104 106 106  CO2 24 26 26   GLUCOSE 120* 130* 95  BUN 14 19 15   CREATININE 0.95 1.05* 0.94  CALCIUM 8.9 8.7* 8.6*   LFT Recent Labs    11/07/19 1046  PROT 6.4*  ALBUMIN 3.3*  AST 15  ALT 12  ALKPHOS 79  BILITOT 0.1*   PT/INR No results for input(s): LABPROT, INR in the last 72 hours. Hepatitis Panel No results for input(s): HEPBSAG, HCVAB, HEPAIGM, HEPBIGM in the last 72 hours.  [place] EKG SITE RITE  Result Date: 11/07/2019 If [facility] image not attached, placement could not be confirmed due to current cardiac rhythm.   Assessment / Plan:  23. 74 year old female with a history of CVA on Plavix and ASA admitted to the hospital with iron  deficiency anemia.  Initial hemoglobin 5.9.  Iron 16. Ferritin 5. B12 256. No overt GI bleeding.  FOBT positive.  Transfused 1 unit of PRBCs on 8/20. Post transfusion Hg 6.6. A 2nd unit of PRBCs ordered by the hospitalist, not yet transfused.  Received IV Infed.  Last dose of Plavix and aspirin were taken at 11 PM on 8/19. -Tentatively planning on EGD +/- colonoscopy and small bowel capsule endoscopy after Plavix washout Tues 8/24.  -Transfuse for Hg < 8 -Monitor H&H closely -Repeat H/H post transfusion -Pantoprazole 40 mg po QD -Soft heart healthy diet   2. History of GERD -See plan in # 1  3.  History of colon polyp -Eventual colonoscopy  4. COPD, stable  5. 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

## 2019-11-08 NOTE — Hospital Course (Addendum)
[name] is a 74 year old Caucasian female with PMH colon polyps, internal hemorrhoids, iron deficiency anemia, CAD, CVA (residual right-sided weakness), COPD who presented to the ER after routine blood work revealed significant anemia.  She was referred to the ER for transfusion and further work-up. Hemoglobin was 6 g/dL on arrival with heme positive stools. She follows outpatient with GI, last seen 02/07/2019.  At that time, discussions were being held to schedule her surveillance colonoscopy.  This was postponed in the setting of the COVID-19 pandemic. PRBC was ordered for transfusion on admission.  GI was consulted and she is tentatively scheduled for EGD and colonoscopy on 11/11/2019 after aspirin and Plavix washout.

## 2019-11-08 NOTE — Assessment & Plan Note (Signed)
-  Continue SSI and CBGs

## 2019-11-08 NOTE — Assessment & Plan Note (Signed)
-   continue wellbutrin and zoloft

## 2019-11-09 DIAGNOSIS — D509 Iron deficiency anemia, unspecified: Secondary | ICD-10-CM

## 2019-11-09 LAB — CBC WITH DIFFERENTIAL/PLATELET
Abs Immature Granulocytes: 0.09 10*3/uL — ABNORMAL HIGH (ref 0.00–0.07)
Basophils Absolute: 0 10*3/uL (ref 0.0–0.1)
Basophils Relative: 0 %
Eosinophils Absolute: 0.2 10*3/uL (ref 0.0–0.5)
Eosinophils Relative: 4 %
HCT: 26.7 % — ABNORMAL LOW (ref 36.0–46.0)
Hemoglobin: 7.7 g/dL — ABNORMAL LOW (ref 12.0–15.0)
Immature Granulocytes: 1 %
Lymphocytes Relative: 28 %
Lymphs Abs: 1.9 10*3/uL (ref 0.7–4.0)
MCH: 19.5 pg — ABNORMAL LOW (ref 26.0–34.0)
MCHC: 28.8 g/dL — ABNORMAL LOW (ref 30.0–36.0)
MCV: 67.6 fL — ABNORMAL LOW (ref 80.0–100.0)
Monocytes Absolute: 0.7 10*3/uL (ref 0.1–1.0)
Monocytes Relative: 11 %
Neutro Abs: 3.8 10*3/uL (ref 1.7–7.7)
Neutrophils Relative %: 56 %
Platelets: 225 10*3/uL (ref 150–400)
RBC: 3.95 MIL/uL (ref 3.87–5.11)
RDW: 24.8 % — ABNORMAL HIGH (ref 11.5–15.5)
WBC: 6.8 10*3/uL (ref 4.0–10.5)
nRBC: 0.6 % — ABNORMAL HIGH (ref 0.0–0.2)

## 2019-11-09 LAB — TYPE AND SCREEN
ABO/RH(D): A POS
Antibody Screen: NEGATIVE
Unit division: 0
Unit division: 0

## 2019-11-09 LAB — BPAM RBC
Blood Product Expiration Date: 202109142359
Blood Product Expiration Date: 202109142359
ISSUE DATE / TIME: 202108201656
ISSUE DATE / TIME: 202108211239
Unit Type and Rh: 6200
Unit Type and Rh: 6200

## 2019-11-09 LAB — HEMOGLOBIN AND HEMATOCRIT, BLOOD
HCT: 27.6 % — ABNORMAL LOW (ref 36.0–46.0)
Hemoglobin: 7.6 g/dL — ABNORMAL LOW (ref 12.0–15.0)

## 2019-11-09 LAB — BASIC METABOLIC PANEL
Anion gap: 6 (ref 5–15)
BUN: 15 mg/dL (ref 8–23)
CO2: 28 mmol/L (ref 22–32)
Calcium: 8.4 mg/dL — ABNORMAL LOW (ref 8.9–10.3)
Chloride: 107 mmol/L (ref 98–111)
Creatinine, Ser: 1.09 mg/dL — ABNORMAL HIGH (ref 0.44–1.00)
GFR calc Af Amer: 58 mL/min — ABNORMAL LOW (ref >60)
GFR calc non Af Amer: 50 mL/min — ABNORMAL LOW (ref >60)
Glucose, Bld: 99 mg/dL (ref 70–99)
Potassium: 3.6 mmol/L (ref 3.5–5.1)
Sodium: 141 mmol/L (ref 135–145)

## 2019-11-09 LAB — MAGNESIUM: Magnesium: 2 mg/dL (ref 1.7–2.4)

## 2019-11-09 LAB — GLUCOSE, CAPILLARY
Glucose-Capillary: 87 mg/dL (ref 70–99)
Glucose-Capillary: 89 mg/dL (ref 70–99)
Glucose-Capillary: 95 mg/dL (ref 70–99)
Glucose-Capillary: 112 mg/dL — ABNORMAL HIGH (ref 70–99)

## 2019-11-09 NOTE — Progress Notes (Signed)
PROGRESS NOTE    REESHEMAH Paul   LXB:[id number]  DOB: 08/30/1945  DOA: 11/07/2019     1  PCP: Velna Hatchet, MD  CC: low hemoglobin  Hospital Course: [name] is a 74 year old Caucasian female with PMH colon polyps, internal hemorrhoids, iron deficiency anemia, CAD, CVA (residual right-sided weakness), COPD who presented to the ER after routine blood work revealed significant anemia.  She was referred to the ER for transfusion and further work-up. Hemoglobin was 6 g/dL on arrival with heme positive stools. She follows outpatient with GI, last seen 02/07/2019.  At that time, discussions were being held to schedule her surveillance colonoscopy.  This was postponed in the setting of the COVID-19 pandemic. PRBC was ordered for transfusion on admission.  GI was consulted and she is tentatively scheduled for EGD and colonoscopy on 11/11/2019 after aspirin and Plavix washout.   Interval History:  Sent to hospital after outpatient blood work showed severe anemia.  Yesterday she had a small bowel movement with clotted blood.  No significant episodes since and otherwise feels the same today as she did yesterday resting in bed. Denies any dizziness or malaise/lethargy.  Old records reviewed in assessment of this patient  ROS: Constitutional: negative for chills and fevers, Respiratory: negative for cough, Cardiovascular: negative for chest pain and Gastrointestinal: negative for abdominal pain  Assessment & Plan: GI bleed - FOBT positive; no obvious bleeding; she does have history of polyps, IH however given extreme IDA I'm concerned for underlying malignancy - plan is for EGD/CLN on 8/24 after plavix washout; depending on results may need to discuss with neuro about restarting plavix - repeat Hgb after PRBC - trend H/H, will transfuse if Hgb<7 g/dL or if further obvious bleeding (had a small BM yesterday with clotted blood but no large drop in Hgb today compared to yesterday and BP  remains stable) - GI following, appreciate assistance   CVA, old, aphasia -Residual right-sided deficits.  Able to ambulate with a walker at home but mostly wheelchair-bound.  Cared for by her husband -Follows outpatient with neurology -On aspirin and Plavix at home.  Currently on hold in anticipation of upcoming endoscopy procedures  DM (diabetes mellitus), type 2, uncontrolled with complications ([place]) -Continue SSI and CBGs  Anemia, iron deficiency -Iron stores checked and are severely low -Received INFeD infusion on 11/07/2019 -May still need oral iron at discharge  COPD (chronic obstructive pulmonary disease) ([place]) - no s/s exacerbation - continue duonebs and incruse ellipta  Essential hypertension - BP remains controlled - continue holding BP meds  Depression - continue wellbutrin and zoloft   Antimicrobials: None  DVT prophylaxis: SCDs Code Status: Full Family Communication: Husband bedside Disposition Plan: Home after endoscopies if stable Status is: Inpatient  Remains inpatient appropriate because:Hemodynamically unstable, Ongoing diagnostic testing needed not appropriate for outpatient work up, Unsafe d/c plan and Inpatient level of care appropriate due to severity of illness   Dispo: The patient is from: Home              Anticipated d/c is to: Home              Anticipated d/c date is: > 3 days              Patient currently is not medically stable to d/c.  Objective: Blood pressure (!) 128/57, pulse 75, temperature 98.8 F (37.1 C), temperature source Oral, resp. rate 16, SpO2 91 %.  Examination: General appearance: alert, cooperative and no distress Head: Normocephalic,  without obvious abnormality, atraumatic Eyes: EOMI Lungs: clear to auscultation bilaterally Heart: regular rate and rhythm and S1, S2 normal Abdomen: normal findings: bowel sounds normal and soft, non-tender Extremities: No edema.  Right hand noted with clenched fist Skin: mobility  and turgor normal Neurologic: Right upper extremity strength 2-3/5.  Left upper extremity 5/5.  Right lower extremity 4/5.  Left lower extremity 5/5.  Sensation intact bilaterally throughout  Consultants:   GI  Procedures:   None  Data Reviewed: I have personally reviewed following labs and imaging studies Results for orders placed or performed during the hospital encounter of 11/07/19 (from the past 24 hour(s))  Glucose, capillary     Status: None   Collection Time: 11/08/19  4:39 PM  Result Value Ref Range   Glucose-Capillary 91 70 - 99 mg/dL  Hemoglobin and hematocrit, blood     Status: Abnormal   Collection Time: 11/08/19  6:24 PM  Result Value Ref Range   Hemoglobin 7.8 (L) 12.0 - 15.0 g/dL   HCT 28.0 (L) 36 - 46 %  Glucose, capillary     Status: None   Collection Time: 11/08/19  8:41 PM  Result Value Ref Range   Glucose-Capillary 95 70 - 99 mg/dL   Comment 1 Notify RN    Comment 2 Document in Chart   Basic metabolic panel     Status: Abnormal   Collection Time: 11/09/19  6:00 AM  Result Value Ref Range   Sodium 141 135 - 145 mmol/L   Potassium 3.6 3.5 - 5.1 mmol/L   Chloride 107 98 - 111 mmol/L   CO2 28 22 - 32 mmol/L   Glucose, Bld 99 70 - 99 mg/dL   BUN 15 8 - 23 mg/dL   Creatinine, Ser 1.09 (H) 0.44 - 1.00 mg/dL   Calcium 8.4 (L) 8.9 - 10.3 mg/dL   GFR calc non Af Amer 50 (L) >60 mL/min   GFR calc Af Amer 58 (L) >60 mL/min   Anion gap 6 5 - 15  Magnesium     Status: None   Collection Time: 11/09/19  6:00 AM  Result Value Ref Range   Magnesium 2.0 1.7 - 2.4 mg/dL  CBC with Differential/Platelet     Status: Abnormal   Collection Time: 11/09/19  6:00 AM  Result Value Ref Range   WBC 6.8 4.0 - 10.5 K/uL   RBC 3.95 3.87 - 5.11 MIL/uL   Hemoglobin 7.7 (L) 12.0 - 15.0 g/dL   HCT 26.7 (L) 36 - 46 %   MCV 67.6 (L) 80.0 - 100.0 fL   MCH 19.5 (L) 26.0 - 34.0 pg   MCHC 28.8 (L) 30.0 - 36.0 g/dL   RDW 24.8 (H) 11.5 - 15.5 %   Platelets 225 150 - 400 K/uL   nRBC  0.6 (H) 0.0 - 0.2 %   Neutrophils Relative % 56 %   Neutro Abs 3.8 1.7 - 7.7 K/uL   Lymphocytes Relative 28 %   Lymphs Abs 1.9 0.7 - 4.0 K/uL   Monocytes Relative 11 %   Monocytes Absolute 0.7 0 - 1 K/uL   Eosinophils Relative 4 %   Eosinophils Absolute 0.2 0 - 0 K/uL   Basophils Relative 0 %   Basophils Absolute 0.0 0 - 0 K/uL   Immature Granulocytes 1 %   Abs Immature Granulocytes 0.09 (H) 0.00 - 0.07 K/uL  Glucose, capillary     Status: None   Collection Time: 11/09/19  7:51 AM  Result Value  Ref Range   Glucose-Capillary 87 70 - 99 mg/dL  Glucose, capillary     Status: None   Collection Time: 11/09/19 12:01 PM  Result Value Ref Range   Glucose-Capillary 89 70 - 99 mg/dL    Recent Results (from the past 240 hour(s))  SARS Coronavirus 2 by RT PCR (hospital order, performed in [facility] hospital lab) Nasopharyngeal Nasopharyngeal Swab     Status: None   Collection Time: 11/07/19 12:24 PM   Specimen: Nasopharyngeal Swab  Result Value Ref Range Status   SARS Coronavirus 2 NEGATIVE NEGATIVE Final    Comment: (NOTE) SARS-CoV-2 target nucleic acids are NOT DETECTED.  The SARS-CoV-2 RNA is generally detectable in upper and lower respiratory specimens during the acute phase of infection. The lowest concentration of SARS-CoV-2 viral copies this assay can detect is 250 copies / mL. A negative result does not preclude SARS-CoV-2 infection and should not be used as the sole basis for treatment or other patient management decisions.  A negative result may occur with improper specimen collection / handling, submission of specimen other than nasopharyngeal swab, presence of viral mutation(s) within the areas targeted by this assay, and inadequate number of viral copies (<250 copies / mL). A negative result must be combined with clinical observations, patient history, and epidemiological information.  Fact Sheet for Patients:   StrictlyIdeas.no  Fact Sheet  for Healthcare Providers: BankingDealers.co.za  This test is not yet approved or  cleared by the [place] FDA and has been authorized for detection and/or diagnosis of SARS-CoV-2 by FDA under an Emergency Use Authorization (EUA).  This EUA will remain in effect (meaning this test can be used) for the duration of the COVID-19 declaration under Section 564(b)(1) of the Act, 21 U.S.C. section 360bbb-3(b)(1), unless the authorization is terminated or revoked sooner.  Performed at [facility], [place] [address] [place], [place] [address]      Radiology Studies: No results found. [place] EKG SITE RITE  Final Result       Scheduled Meds:  atorvastatin  40 mg Oral QHS   buPROPion  150 mg Oral QPM   Chlorhexidine Gluconate Cloth  6 each Topical Daily   folic acid  1 mg Oral Daily   insulin aspart  0-5 Units Subcutaneous QHS   insulin aspart  0-9 Units Subcutaneous TID WC   multivitamin with minerals  1 tablet Oral Daily   pantoprazole  40 mg Oral Daily   polyethylene glycol  17 g Oral Daily   senna-docusate  1 tablet Oral BID   sertraline  100 mg Oral QHS   sodium chloride flush  10-40 mL Intracatheter Q12H   umeclidinium bromide  1 puff Inhalation Daily   vitamin B-12  1,000 mcg Oral Daily   PRN Meds: ipratropium-albuterol, ondansetron **OR** ondansetron (ZOFRAN) IV, sodium chloride flush Continuous Infusions:     LOS: 1 day  Time spent: Greater than 50% of the 35 minute visit was spent in counseling/coordination of care for the patient as laid out in the A&P.   Dwyane Dee, MD [facility] 11/09/2019, 1:32 PM   Contact via secure chat.  To contact the attending provider between 7A-7P or the covering provider during after hours 7P-7A, please log into the web site [email or web address] and access using universal [place] password for that web site. If you do not have the password, please call the hospital  operator.

## 2019-11-09 NOTE — Progress Notes (Addendum)
[facility] Progress Note  CC:  IDA, heme + stool   Subjective:  She passed a large hard darker brown BM yesterday afternoon with a small amount of bright red blood and clot on the end of the stool as reported by her Merchandiser, retail. No BM thus far today. No N/V. No abdominal pain. Husband at the bedside.   Objective:  Vital signs in last 24 hours: Temp:  [98.8 F (37.1 C)-99.5 F (37.5 C)] 98.8 F (37.1 C) (08/22 0509) Pulse Rate:  [75-95] 75 (08/22 0509) Resp:  [16-20] 16 (08/22 0509) BP: (111-134)/(45-57) 128/57 (08/22 0509) SpO2:  [90 %-93 %] 91 % (08/22 0859)  General:  Awake, alert in NAD.  Heart: RRR, no murmur. Pulm:  Breath sounds clear throughout.  Abdomen: Soft, nontender. + BS x 4 quads. No HSM. Extremities:  Without edema. Neurologic:  Alert and  oriented x4;  Right arm with mild contracture secondary to past CVA.  Psych:  Alert and cooperative. Normal mood and affect.  Intake/Output from previous day: 08/21 0701 - 08/22 0700 In: 394 [Blood:394] Out: 300 [Urine:300] Intake/Output this shift: No intake/output data recorded.  Lab Results: Recent Labs    11/07/19 1046 11/07/19 1046 11/08/19 0522 11/08/19 1824 11/09/19 0600  WBC 5.6  --  8.0  --  6.8  HGB 5.9*   < > 6.6* 7.8* 7.7*  HCT 23.0*   < > 23.9* 28.0* 26.7*  PLT 265  --  240  --  225   < > = values in this interval not displayed.   BMET Recent Labs    11/07/19 1046 11/08/19 0522 11/09/19 0600  NA 140 141 141  K 4.0 4.1 3.6  CL 106 106 107  CO2 26 26 28   GLUCOSE 130* 95 99  BUN 19 15 15   CREATININE 1.05* 0.94 1.09*  CALCIUM 8.7* 8.6* 8.4*   LFT Recent Labs    11/07/19 1046  PROT 6.4*  ALBUMIN 3.3*  AST 15  ALT 12  ALKPHOS 79  BILITOT 0.1*   PT/INR No results for input(s): LABPROT, INR in the last 72 hours. Hepatitis Panel No results for input(s): HEPBSAG, HCVAB, HEPAIGM, HEPBIGM in the last 72 hours.  [place] EKG SITE RITE  Result Date: 11/07/2019 If [facility]  image not attached, placement could not be confirmed due to current cardiac rhythm.   Assessment / Plan:  34. 74 year old female with a history of CVA on Plavix and ASA admitted to the hospital with iron deficiency anemia. Initial hemoglobin 5.9. Iron 16. Ferritin 5. B12 256. No overt GI bleeding. FOBT positive. Transfused 1 unit of PRBCs on 8/20. Post transfusion Hg 6.6. A 2nd unit of PRBCs transfused. Post transfusion Hg 7.8. Today Hg 7.7. Received IV Infed. Last dose of Plavix and aspirin were taken at 11 PM on 8/19. She passed a small amount of red blood with a small blood clot with a difficult BM yesterday. No further rectal bleeding this am. She is hemodynamically stable. -Continue to hold Plavix -Repeat H/H 2pm today, If Hg < 7.5 would recommend PRBC transfusion.  -Tentatively planning on EGDand  colonoscopy after Plavix washout on Tues 8/24.  -Pantoprazole 40 mg po QD -Soft heart healthy diet  -Miralax and Senna ordered by the hospitalist   2. History of GERD -Continue Pantoprazole 40mg  po daily  3. History of colon polyp -Eventual colonoscopy  4.COPD, stable  5. 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  Pyrtle    Active Problems:   CVA, old, aphasia   DM (diabetes mellitus), type 2, uncontrolled with complications (HCC)   Anemia, iron deficiency   Essential hypertension   GI bleed   COPD (chronic obstructive pulmonary disease) (HCC)   Depression   GIB (gastrointestinal bleeding)     LOS: 1 day   Noralyn Pick  11/09/2019, 9:09 AM

## 2019-11-10 DIAGNOSIS — Z7902 Long term (current) use of antithrombotics/antiplatelets: Secondary | ICD-10-CM

## 2019-11-10 DIAGNOSIS — I69320 Aphasia following cerebral infarction: Secondary | ICD-10-CM

## 2019-11-10 LAB — CBC WITH DIFFERENTIAL/PLATELET
Abs Immature Granulocytes: 0.13 10*3/uL — ABNORMAL HIGH (ref 0.00–0.07)
Basophils Absolute: 0 10*3/uL (ref 0.0–0.1)
Basophils Relative: 1 %
Eosinophils Absolute: 0.3 10*3/uL (ref 0.0–0.5)
Eosinophils Relative: 4 %
HCT: 28.5 % — ABNORMAL LOW (ref 36.0–46.0)
Hemoglobin: 7.8 g/dL — ABNORMAL LOW (ref 12.0–15.0)
Immature Granulocytes: 2 %
Lymphocytes Relative: 19 %
Lymphs Abs: 1.6 10*3/uL (ref 0.7–4.0)
MCH: 18.9 pg — ABNORMAL LOW (ref 26.0–34.0)
MCHC: 27.4 g/dL — ABNORMAL LOW (ref 30.0–36.0)
MCV: 69.2 fL — ABNORMAL LOW (ref 80.0–100.0)
Monocytes Absolute: 0.7 10*3/uL (ref 0.1–1.0)
Monocytes Relative: 9 %
Neutro Abs: 5.8 10*3/uL (ref 1.7–7.7)
Neutrophils Relative %: 65 %
Platelets: 229 10*3/uL (ref 150–400)
RBC: 4.12 MIL/uL (ref 3.87–5.11)
RDW: 25.6 % — ABNORMAL HIGH (ref 11.5–15.5)
WBC: 8.6 10*3/uL (ref 4.0–10.5)
nRBC: 0.6 % — ABNORMAL HIGH (ref 0.0–0.2)

## 2019-11-10 LAB — BASIC METABOLIC PANEL
Anion gap: 6 (ref 5–15)
BUN: 14 mg/dL (ref 8–23)
CO2: 27 mmol/L (ref 22–32)
Calcium: 8.4 mg/dL — ABNORMAL LOW (ref 8.9–10.3)
Chloride: 107 mmol/L (ref 98–111)
Creatinine, Ser: 0.84 mg/dL (ref 0.44–1.00)
GFR calc Af Amer: >60 mL/min (ref >60)
GFR calc non Af Amer: >60 mL/min (ref >60)
Glucose, Bld: 95 mg/dL (ref 70–99)
Potassium: 4 mmol/L (ref 3.5–5.1)
Sodium: 140 mmol/L (ref 135–145)

## 2019-11-10 LAB — GLUCOSE, CAPILLARY
Glucose-Capillary: 86 mg/dL (ref 70–99)
Glucose-Capillary: 87 mg/dL (ref 70–99)
Glucose-Capillary: 99 mg/dL (ref 70–99)
Glucose-Capillary: 95 mg/dL (ref 70–99)

## 2019-11-10 LAB — MAGNESIUM: Magnesium: 2 mg/dL (ref 1.7–2.4)

## 2019-11-10 MED ORDER — PEG-KCL-NACL-NASULF-NA ASC-C 100 G PO SOLR
0.5000 | Freq: Once | ORAL | Status: AC
  Administered 2019-11-10: 100 g via ORAL
  Filled 2019-11-10: qty 1

## 2019-11-10 MED ORDER — PEG-KCL-NACL-NASULF-NA ASC-C 100 G PO SOLR
0.5000 | Freq: Once | ORAL | Status: AC
  Administered 2019-11-11: 100 g via ORAL

## 2019-11-10 MED ORDER — PEG-KCL-NACL-NASULF-NA ASC-C 100 G PO SOLR: 1.0000 | Freq: Once | ORAL | Status: DC

## 2019-11-10 NOTE — Progress Notes (Addendum)
Progress Note         ASSESSMENT AND PLAN:   CC iron deficiency anemia  74 year old female with PMH significant for but not limited to history of iron deficiency anemia, history of colon polyps, DM, CVA on Plavix, hyperlipidemia, history of MI secondary to coronary spasm,  COPD and breast cancer  #Iron deficiency anemia / FOBT+ on Plavix and aspirin --Hemoglobin improved from 5.9 to 7.8 post 2 units of blood --We will start bowel prep this evening in preparation for colonoscopy and EGD in the a.m. .  The risk and benefits of EGD and colonoscopy with possible biopsies and polypectomies were discussed and the patient agrees to proceed   SUBJECTIVE   Feels okay, no complaints.    OBJECTIVE:     Vital signs in last 24 hours: Temp:  [98.5 F (36.9 C)-98.9 F (37.2 C)] 98.9 F (37.2 C) (08/23 0527) Pulse Rate:  [72-75] 72 (08/23 0527) Resp:  [15-16] 16 (08/23 0527) BP: (115-137)/(49-55) 137/55 (08/23 0527) SpO2:  [92 %-96 %] 92 % (08/23 0527) Last BM Date: 11/08/19 General:   Alert, in NAD Heart:  Regular rate and rhythm.  No lower extremity edema   Pulm: Normal respiratory effort   Abdomen:  Soft, nondistended, mild periumbilical tenderness. Fullness in periumbilical region,  but limited exam as patient sitting on bed pan.  Normal bowel sounds.          Neurologic:  Alert and  oriented,  grossly normal neurologically. Psych:  Pleasant, cooperative.  Normal mood and affect.   Intake/Output from previous day: 08/22 0701 - 08/23 0700 In: 360 [P.O.:360] Out: -  Intake/Output this shift: No intake/output data recorded.  Lab Results: Recent Labs    11/08/19 0522 11/08/19 1824 11/09/19 0600 11/09/19 1423 11/10/19 0800  WBC 8.0  --  6.8  --  8.6  HGB 6.6*   < > 7.7* 7.6* 7.8*  HCT 23.9*   < > 26.7* 27.6* 28.5*  PLT 240  --  225  --  229   < > = values in this interval not displayed.   BMET Recent Labs    11/08/19 0522 11/09/19 0600 11/10/19 0800  NA 141  141 140  K 4.1 3.6 4.0  CL 106 107 107  CO2 26 28 27   GLUCOSE 95 99 95  BUN 15 15 14   CREATININE 0.94 1.09* 0.84  CALCIUM 8.6* 8.4* 8.4*   LFT Recent Labs    11/07/19 1046  PROT 6.4*  ALBUMIN 3.3*  AST 15  ALT 12  ALKPHOS 51  BILITOT 0.1*     Active Problems:   CVA, old, aphasia   DM (diabetes mellitus), type 2, uncontrolled with complications (HCC)   Anemia, iron deficiency   Essential hypertension   GI bleed   COPD (chronic obstructive pulmonary disease) ([place])   Depression   GIB (gastrointestinal bleeding)     LOS: 2 days   Tye Savoy ,NP 11/10/2019, 9:04 AM   I have discussed the case with the PA, and that is the plan I formulated. I personally interviewed and examined the patient.  Denies abdominal pain or chest pain, breathing is at baseline.  Iron deficiency anemia of unclear cause.  Coronary disease and cerebrovascular disease on Plavix; COPD.  Last colonoscopy with polyps 4 years ago.  Hemoglobin stable after transfusion of PRBCs and administration of IV iron.  I have amended the plan to a colonoscopy with small bowel enteroscopy tomorrow.  She was agreeable after  discussion of procedure and risks  The benefits and risks of the planned procedure were described in detail with the patient or (when appropriate) their health care proxy.  Risks were outlined as including, but not limited to, bleeding, infection, perforation, adverse medication reaction leading to cardiac or pulmonary decompensation, pancreatitis (if ERCP).  The limitation of incomplete mucosal visualization was also discussed.  No guarantees or warranties were given.  Patient at increased risk for cardiopulmonary complications of procedure due to medical comorbidities.  Total time 35 minutes   Nelida Meuse III Office: [phone]

## 2019-11-10 NOTE — Plan of Care (Signed)
Pt VS WNL.  No complaints at this time.   Problem: Clinical Measurements: Goal: Will remain free from infection Outcome: Progressing   Problem: Activity: Goal: Risk for activity intolerance will decrease Outcome: Progressing   Problem: Nutrition: Goal: Adequate nutrition will be maintained Outcome: Progressing

## 2019-11-10 NOTE — H&P (View-Only) (Signed)
Progress Note         ASSESSMENT AND PLAN:   CC iron deficiency anemia  74 year old female with PMH significant for but not limited to history of iron deficiency anemia, history of colon polyps, DM, CVA on Plavix, hyperlipidemia, history of MI secondary to coronary spasm,  COPD and breast cancer  #Iron deficiency anemia / FOBT+ on Plavix and aspirin --Hemoglobin improved from 5.9 to 7.8 post 2 units of blood --We will start bowel prep this evening in preparation for colonoscopy and EGD in the a.m. .  The risk and benefits of EGD and colonoscopy with possible biopsies and polypectomies were discussed and the patient agrees to proceed   SUBJECTIVE   Feels okay, no complaints.    OBJECTIVE:     Vital signs in last 24 hours: Temp:  [98.5 F (36.9 C)-98.9 F (37.2 C)] 98.9 F (37.2 C) (08/23 0527) Pulse Rate:  [72-75] 72 (08/23 0527) Resp:  [15-16] 16 (08/23 0527) BP: (115-137)/(49-55) 137/55 (08/23 0527) SpO2:  [92 %-96 %] 92 % (08/23 0527) Last BM Date: 11/08/19 General:   Alert, in NAD Heart:  Regular rate and rhythm.  No lower extremity edema   Pulm: Normal respiratory effort   Abdomen:  Soft, nondistended, mild periumbilical tenderness. Fullness in periumbilical region,  but limited exam as patient sitting on bed pan.  Normal bowel sounds.          Neurologic:  Alert and  oriented,  grossly normal neurologically. Psych:  Pleasant, cooperative.  Normal mood and affect.   Intake/Output from previous day: 08/22 0701 - 08/23 0700 In: 360 [P.O.:360] Out: -  Intake/Output this shift: No intake/output data recorded.  Lab Results: Recent Labs    11/08/19 0522 11/08/19 1824 11/09/19 0600 11/09/19 1423 11/10/19 0800  WBC 8.0  --  6.8  --  8.6  HGB 6.6*   < > 7.7* 7.6* 7.8*  HCT 23.9*   < > 26.7* 27.6* 28.5*  PLT 240  --  225  --  229   < > = values in this interval not displayed.   BMET Recent Labs    11/08/19 0522 11/09/19 0600 11/10/19 0800  NA 141  141 140  K 4.1 3.6 4.0  CL 106 107 107  CO2 26 28 27   GLUCOSE 95 99 95  BUN 15 15 14   CREATININE 0.94 1.09* 0.84  CALCIUM 8.6* 8.4* 8.4*   LFT Recent Labs    11/07/19 1046  PROT 6.4*  ALBUMIN 3.3*  AST 15  ALT 12  ALKPHOS 73  BILITOT 0.1*     Active Problems:   CVA, old, aphasia   DM (diabetes mellitus), type 2, uncontrolled with complications (HCC)   Anemia, iron deficiency   Essential hypertension   GI bleed   COPD (chronic obstructive pulmonary disease) (HCC)   Depression   GIB (gastrointestinal bleeding)     LOS: 2 days   Tye Savoy ,NP 11/10/2019, 9:04 AM   I have discussed the case with the PA, and that is the plan I formulated. I personally interviewed and examined the patient.  Denies abdominal pain or chest pain, breathing is at baseline.  Iron deficiency anemia of unclear cause.  Coronary disease and cerebrovascular disease on Plavix; COPD.  Last colonoscopy with polyps 4 years ago.  Hemoglobin stable after transfusion of PRBCs and administration of IV iron.  I have amended the plan to a colonoscopy with small bowel enteroscopy tomorrow.  She was agreeable after  discussion of procedure and risks  The benefits and risks of the planned procedure were described in detail with the patient or (when appropriate) their health care proxy.  Risks were outlined as including, but not limited to, bleeding, infection, perforation, adverse medication reaction leading to cardiac or pulmonary decompensation, pancreatitis (if ERCP).  The limitation of incomplete mucosal visualization was also discussed.  No guarantees or warranties were given.  Patient at increased risk for cardiopulmonary complications of procedure due to medical comorbidities.  Total time 35 minutes   Nelida Meuse III Office: [phone]

## 2019-11-10 NOTE — Progress Notes (Signed)
PROGRESS NOTE    Brandy Paul   ZOX:[id number]  DOB: May 10, 1945  DOA: 11/07/2019     2  PCP: Brandy Hatchet, MD  CC: low hemoglobin  Hospital Course: Brandy Paul is a 74 year old Caucasian female with PMH colon polyps, internal hemorrhoids, iron deficiency anemia, CAD, CVA (residual right-sided weakness), COPD who presented to the ER after routine blood work revealed significant anemia.  She was referred to the ER for transfusion and further work-up. Hemoglobin was 6 g/dL on arrival with heme positive stools. She follows outpatient with GI, last seen 02/07/2019.  At that time, discussions were being held to schedule her surveillance colonoscopy.  This was postponed in the setting of the COVID-19 pandemic. PRBC was ordered for transfusion on admission.  GI was consulted and she is tentatively scheduled for EGD and colonoscopy on 11/11/2019 after aspirin and Plavix washout.   Interval History:  Sent to hospital after outpatient blood work showed severe anemia.  Overall doing well.  No further clotted stools since yesterday.  Denies any symptoms this morning and is awaiting procedures tomorrow.  Bowel prep starts later today and she is aware.  Old records reviewed in assessment of this patient  ROS: Constitutional: negative for chills and fevers, Respiratory: negative for cough, Cardiovascular: negative for chest pain and Gastrointestinal: negative for abdominal pain  Assessment & Plan: GI bleed - FOBT positive; no obvious bleeding; she does have history of polyps, IH however given extreme IDA I'm concerned for underlying malignancy - plan is for EGD/CLN on 8/24 after plavix washout; depending on results may need to discuss with neuro about restarting plavix - repeat Hgb after PRBC - trend H/H, will transfuse if Hgb<7 g/dL or if further obvious bleeding (had a small BM yesterday with clotted blood but no large drop in Hgb today compared to yesterday and BP remains stable) - GI  following, appreciate assistance  -Hemoglobin still remaining stable, 7.8 g/dL this morning.  Repeat CBC again in a.m.  Plan remains EGD and colonoscopy on Tuesday.  CVA, old, aphasia -Residual right-sided deficits.  Able to ambulate with a walker at home but mostly wheelchair-bound.  Cared for by her husband -Follows outpatient with neurology -On aspirin and Plavix at home.  Currently on hold in anticipation of upcoming endoscopy procedures  DM (diabetes mellitus), type 2, uncontrolled with complications (Brandy Paul) -Continue SSI and CBGs  Anemia, iron deficiency -Iron stores checked and are severely low -Received INFeD infusion on 11/07/2019 -May still need oral iron at discharge  COPD (chronic obstructive pulmonary disease) ([place]) - no s/s exacerbation - continue duonebs and incruse ellipta  Essential hypertension - BP remains controlled - continue holding BP meds  Depression - continue wellbutrin and zoloft   Antimicrobials: None  DVT prophylaxis: SCDs Code Status: Full Family Communication: Husband bedside Disposition Plan: Home after endoscopies if stable Status is: Inpatient  Remains inpatient appropriate because:Hemodynamically unstable, Ongoing diagnostic testing needed not appropriate for outpatient work up, Unsafe d/c plan and Inpatient level of care appropriate due to severity of illness   Dispo: The patient is from: Home              Anticipated d/c is to: Home              Anticipated d/c date is: 2 days              Patient currently is not medically stable to d/c.  Objective: Blood pressure (!) 137/55, pulse 72, temperature 98.9 F (37.2 C), temperature  source Oral, resp. rate 16, SpO2 90 %.  Examination: General appearance: alert, cooperative and no distress Head: Normocephalic, without obvious abnormality, atraumatic Eyes: EOMI Lungs: clear to auscultation bilaterally Heart: regular rate and rhythm and S1, S2 normal Abdomen: normal findings: bowel  sounds normal and soft, non-tender Extremities: No edema.  Right hand noted with clenched fist Skin: mobility and turgor normal Neurologic: Right upper extremity strength 2-3/5.  Left upper extremity 5/5.  Right lower extremity 4/5.  Left lower extremity 5/5.  Sensation intact bilaterally throughout  Consultants:   GI  Procedures:   None  Data Reviewed: I have personally reviewed following labs and imaging studies Results for orders placed or performed during the hospital encounter of 11/07/19 (from the past 24 hour(s))  Hemoglobin and hematocrit, blood     Status: Abnormal   Collection Time: 11/09/19  2:23 PM  Result Value Ref Range   Hemoglobin 7.6 (L) 12.0 - 15.0 g/dL   HCT 27.6 (L) 36 - 46 %  Glucose, capillary     Status: None   Collection Time: 11/09/19  4:54 PM  Result Value Ref Range   Glucose-Capillary 95 70 - 99 mg/dL  Glucose, capillary     Status: Abnormal   Collection Time: 11/09/19  9:19 PM  Result Value Ref Range   Glucose-Capillary 112 (H) 70 - 99 mg/dL  Basic metabolic panel     Status: Abnormal   Collection Time: 11/10/19  8:00 AM  Result Value Ref Range   Sodium 140 135 - 145 mmol/L   Potassium 4.0 3.5 - 5.1 mmol/L   Chloride 107 98 - 111 mmol/L   CO2 27 22 - 32 mmol/L   Glucose, Bld 95 70 - 99 mg/dL   BUN 14 8 - 23 mg/dL   Creatinine, Ser 0.84 0.44 - 1.00 mg/dL   Calcium 8.4 (L) 8.9 - 10.3 mg/dL   GFR calc non Af Amer >60 >60 mL/min   GFR calc Af Amer >60 >60 mL/min   Anion gap 6 5 - 15  Magnesium     Status: None   Collection Time: 11/10/19  8:00 AM  Result Value Ref Range   Magnesium 2.0 1.7 - 2.4 mg/dL  CBC with Differential/Platelet     Status: Abnormal   Collection Time: 11/10/19  8:00 AM  Result Value Ref Range   WBC 8.6 4.0 - 10.5 K/uL   RBC 4.12 3.87 - 5.11 MIL/uL   Hemoglobin 7.8 (L) 12.0 - 15.0 g/dL   HCT 28.5 (L) 36 - 46 %   MCV 69.2 (L) 80.0 - 100.0 fL   MCH 18.9 (L) 26.0 - 34.0 pg   MCHC 27.4 (L) 30.0 - 36.0 g/dL   RDW 25.6 (H)  11.5 - 15.5 %   Platelets 229 150 - 400 K/uL   nRBC 0.6 (H) 0.0 - 0.2 %   Neutrophils Relative % 65 %   Neutro Abs 5.8 1.7 - 7.7 K/uL   Lymphocytes Relative 19 %   Lymphs Abs 1.6 0.7 - 4.0 K/uL   Monocytes Relative 9 %   Monocytes Absolute 0.7 0 - 1 K/uL   Eosinophils Relative 4 %   Eosinophils Absolute 0.3 0 - 0 K/uL   Basophils Relative 1 %   Basophils Absolute 0.0 0 - 0 K/uL   Immature Granulocytes 2 %   Abs Immature Granulocytes 0.13 (H) 0.00 - 0.07 K/uL   Acanthocytes PRESENT    Polychromasia PRESENT    Target Cells PRESENT  Ovalocytes PRESENT   Glucose, capillary     Status: None   Collection Time: 11/10/19  8:05 AM  Result Value Ref Range   Glucose-Capillary 86 70 - 99 mg/dL  Glucose, capillary     Status: None   Collection Time: 11/10/19 11:51 AM  Result Value Ref Range   Glucose-Capillary 87 70 - 99 mg/dL    Recent Results (from the past 240 hour(s))  SARS Coronavirus 2 by RT PCR (hospital order, performed in [facility] hospital lab) Nasopharyngeal Nasopharyngeal Swab     Status: None   Collection Time: 11/07/19 12:24 PM   Specimen: Nasopharyngeal Swab  Result Value Ref Range Status   SARS Coronavirus 2 NEGATIVE NEGATIVE Final    Comment: (NOTE) SARS-CoV-2 target nucleic acids are NOT DETECTED.  The SARS-CoV-2 RNA is generally detectable in upper and lower respiratory specimens during the acute phase of infection. The lowest concentration of SARS-CoV-2 viral copies this assay can detect is 250 copies / mL. A negative result does not preclude SARS-CoV-2 infection and should not be used as the sole basis for treatment or other patient management decisions.  A negative result may occur with improper specimen collection / handling, submission of specimen other than nasopharyngeal swab, presence of viral mutation(s) within the areas targeted by this assay, and inadequate number of viral copies (<250 copies / mL). A negative result must be combined with  clinical observations, patient history, and epidemiological information.  Fact Sheet for Patients:   StrictlyIdeas.no  Fact Sheet for Healthcare Providers: BankingDealers.co.za  This test is not yet approved or  cleared by the [place] FDA and has been authorized for detection and/or diagnosis of SARS-CoV-2 by FDA under an Emergency Use Authorization (EUA).  This EUA will remain in effect (meaning this test can be used) for the duration of the COVID-19 declaration under Section 564(b)(1) of the Act, 21 U.S.C. section 360bbb-3(b)(1), unless the authorization is terminated or revoked sooner.  Performed at [facility], [place] [address], [place], [place] [address]      Radiology Studies: No results found. [place] EKG SITE RITE  Final Result       Scheduled Meds: . atorvastatin  40 mg Oral QHS  . buPROPion  150 mg Oral QPM  . Chlorhexidine Gluconate Cloth  6 each Topical Daily  . folic acid  1 mg Oral Daily  . insulin aspart  0-5 Units Subcutaneous QHS  . insulin aspart  0-9 Units Subcutaneous TID WC  . multivitamin with minerals  1 tablet Oral Daily  . pantoprazole  40 mg Oral Daily  . peg 3350 powder  0.5 kit Oral Once   And  . [START ON 11/11/2019] peg 3350 powder  0.5 kit Oral Once  . polyethylene glycol  17 g Oral Daily  . senna-docusate  1 tablet Oral BID  . sertraline  100 mg Oral QHS  . sodium chloride flush  10-40 mL Intracatheter Q12H  . umeclidinium bromide  1 puff Inhalation Daily  . vitamin B-12  1,000 mcg Oral Daily   PRN Meds: ipratropium-albuterol, ondansetron **OR** ondansetron (ZOFRAN) IV, sodium chloride flush Continuous Infusions:     LOS: 2 days  Time spent: Greater than 50% of the 35 minute visit was spent in counseling/coordination of care for the patient as laid out in the A&P.   Dwyane Dee, MD [facility] 11/10/2019, 1:12 PM   Contact via secure chat.  To contact  the attending provider between 7A-7P or the covering provider  during after hours 7P-7A, please log into the web site [email or web address] and access using universal [place] password for that web site. If you do not have the password, please call the hospital operator.

## 2019-11-11 ENCOUNTER — Encounter (HOSPITAL_COMMUNITY): Payer: Self-pay | Admitting: Internal Medicine

## 2019-11-11 ENCOUNTER — Inpatient Hospital Stay (HOSPITAL_COMMUNITY): Payer: Medicare Other

## 2019-11-11 ENCOUNTER — Inpatient Hospital Stay (HOSPITAL_COMMUNITY): Payer: Medicare Other | Admitting: Certified Registered Nurse Anesthetist

## 2019-11-11 ENCOUNTER — Encounter (HOSPITAL_COMMUNITY)
Admission: EM | Disposition: A | Discharge status: Home / Self Care | Payer: Self-pay | Source: Home / Self Care | Attending: Internal Medicine

## 2019-11-11 DIAGNOSIS — K6289 Other specified diseases of anus and rectum: Secondary | ICD-10-CM

## 2019-11-11 DIAGNOSIS — K449 Diaphragmatic hernia without obstruction or gangrene: Secondary | ICD-10-CM

## 2019-11-11 HISTORY — PX: BIOPSY: SHX5522

## 2019-11-11 HISTORY — PX: ENTEROSCOPY: SHX5533

## 2019-11-11 HISTORY — PX: COLONOSCOPY WITH PROPOFOL: SHX5780

## 2019-11-11 LAB — CBC WITH DIFFERENTIAL/PLATELET
Abs Immature Granulocytes: 0.06 10*3/uL (ref 0.00–0.07)
Basophils Absolute: 0 10*3/uL (ref 0.0–0.1)
Basophils Relative: 0 %
Eosinophils Absolute: 0.3 10*3/uL (ref 0.0–0.5)
Eosinophils Relative: 4 %
HCT: 31.5 % — ABNORMAL LOW (ref 36.0–46.0)
Hemoglobin: 8.8 g/dL — ABNORMAL LOW (ref 12.0–15.0)
Immature Granulocytes: 1 %
Lymphocytes Relative: 14 %
Lymphs Abs: 1.1 10*3/uL (ref 0.7–4.0)
MCH: 19.6 pg — ABNORMAL LOW (ref 26.0–34.0)
MCHC: 27.9 g/dL — ABNORMAL LOW (ref 30.0–36.0)
MCV: 70 fL — ABNORMAL LOW (ref 80.0–100.0)
Monocytes Absolute: 0.9 10*3/uL (ref 0.1–1.0)
Monocytes Relative: 11 %
Neutro Abs: 5.9 10*3/uL (ref 1.7–7.7)
Neutrophils Relative %: 70 %
Platelets: 233 10*3/uL (ref 150–400)
RBC: 4.5 MIL/uL (ref 3.87–5.11)
RDW: 26.6 % — ABNORMAL HIGH (ref 11.5–15.5)
WBC: 8.3 10*3/uL (ref 4.0–10.5)
nRBC: 0.2 % (ref 0.0–0.2)

## 2019-11-11 LAB — BASIC METABOLIC PANEL
Anion gap: 11 (ref 5–15)
BUN: 12 mg/dL (ref 8–23)
CO2: 26 mmol/L (ref 22–32)
Calcium: 9 mg/dL (ref 8.9–10.3)
Chloride: 104 mmol/L (ref 98–111)
Creatinine, Ser: 0.96 mg/dL (ref 0.44–1.00)
GFR calc Af Amer: >60 mL/min (ref >60)
GFR calc non Af Amer: 58 mL/min — ABNORMAL LOW (ref >60)
Glucose, Bld: 93 mg/dL (ref 70–99)
Potassium: 4 mmol/L (ref 3.5–5.1)
Sodium: 141 mmol/L (ref 135–145)

## 2019-11-11 LAB — GLUCOSE, CAPILLARY
Glucose-Capillary: 96 mg/dL (ref 70–99)
Glucose-Capillary: 100 mg/dL — ABNORMAL HIGH (ref 70–99)
Glucose-Capillary: 131 mg/dL — ABNORMAL HIGH (ref 70–99)

## 2019-11-11 LAB — MAGNESIUM: Magnesium: 2.2 mg/dL (ref 1.7–2.4)

## 2019-11-11 SURGERY — COLONOSCOPY WITH PROPOFOL
Anesthesia: Monitor Anesthesia Care

## 2019-11-11 MED ORDER — LIDOCAINE 2% (20 MG/ML) 5 ML SYRINGE
INTRAMUSCULAR | Status: DC | PRN
  Administered 2019-11-11: 60 mg via INTRAVENOUS

## 2019-11-11 MED ORDER — PROPOFOL 500 MG/50ML IV EMUL
INTRAVENOUS | Status: DC | PRN
  Administered 2019-11-11: 125 ug/kg/min via INTRAVENOUS

## 2019-11-11 MED ORDER — FERROUS SULFATE 325 (65 FE) MG PO TABS
325.0000 mg | ORAL_TABLET | Freq: Every day | ORAL | Status: DC
  Administered 2019-11-12: 325 mg via ORAL
  Filled 2019-11-11: qty 1

## 2019-11-11 MED ORDER — EPHEDRINE SULFATE-NACL 50-0.9 MG/10ML-% IV SOSY
PREFILLED_SYRINGE | INTRAVENOUS | Status: DC | PRN
  Administered 2019-11-11: 10 mg via INTRAVENOUS

## 2019-11-11 MED ORDER — LACTATED RINGERS IV SOLN: INTRAVENOUS | Status: DC

## 2019-11-11 MED ORDER — PROPOFOL 10 MG/ML IV BOLUS
INTRAVENOUS | Status: DC | PRN
  Administered 2019-11-11 (×2): 20 mg via INTRAVENOUS

## 2019-11-11 MED ORDER — PHENYLEPHRINE 40 MCG/ML (10ML) SYRINGE FOR IV PUSH (FOR BLOOD PRESSURE SUPPORT)
PREFILLED_SYRINGE | INTRAVENOUS | Status: DC | PRN
  Administered 2019-11-11 (×3): 80 ug via INTRAVENOUS

## 2019-11-11 MED ORDER — LACTATED RINGERS IV SOLN: INTRAVENOUS | Status: DC | PRN

## 2019-11-11 SURGICAL SUPPLY — 22 items

## 2019-11-11 NOTE — Progress Notes (Addendum)
PROGRESS NOTE    Brandy Paul  TDV:[id number] DOB: 1945-12-20 DOA: 11/07/2019 PCP: Velna Hatchet, MD   Brief Narrative:  Patient is a 46 female with history of chronic polyps, internal hemorrhoids, iron deficiency anemia, coronary artery disease, CVA with residual right-sided weakness, COPD who presents to the emergency department after her PCP referred her for evaluation of anemia.  On presentation she had hemoglobin of 6 with heme positive stools.  She was transfused with PRBC during this hospitalization.  She underwent EGD and colonoscopy on 11/11/2019 and found to have erosive gastritis.  Plan for discharge tomorrow to home if her hemoglobin remains stable.  Assessment & Plan:   Active Problems:   CVA, old, aphasia   DM (diabetes mellitus), type 2, uncontrolled with complications (HCC)   Anemia, iron deficiency   Essential hypertension   GI bleed   COPD (chronic obstructive pulmonary disease) (HCC)   Depression   GIB (gastrointestinal bleeding)   Upper GI bleed: She was referred by her PCP for evaluation of anemia.  FOBT was positive.  She had history of polyps.  She was found to have  iron deficiency anemia so GI was consulted.  Underwent EGD and colonoscopy on 11/11/2019 with finding of erosive gastritis, hiatal hernia.  She has been started on previous diet.  We will check CBC tomorrow.  Currently hemoglobin stable in the range of 8.  She was transfused with PRBC during this hospitalization. Iron studies had shown  low iron.  Hiatal hernia: Finding as per EGD.  Plan is to do a noncontrast CT.  Nonhemorrhagic CVA: Has right residual deficits.  Ambulates with the help of walker/cane at home.  Follows with neurology.  On aspirin and Plavix at home.  Diabetes type 2: Continue sliding scale insulin for now.  Continue home medications on discharge.  COPD: Currently not in exacerbation.  Continue bronchodilators as needed.  On inhalers at home.  Hypertension: Currently blood  pressures well controlled.  Resume home medications on discharge.  Depression: Continue Wellbutrin, Zoloft  Obesity: BMI of 34           DVT prophylaxis:SCD Code Status: Full Family Communication: Husband at the bedside Status is: Inpatient  Remains inpatient appropriate because:Unsafe d/c plan   Dispo: The patient is from: Home              Anticipated d/c is to: Home              Anticipated d/c date is: 1 day              Patient currently is medically stable to d/c.  Ready for tomorrow after checking hemoglobin in the morning, noncontrast CT of the chest.  Consultants: GI  Procedures: EGD, colonoscopy  Antimicrobials:  Anti-infectives (From admission, onward)   None      Subjective: Patient seen and examined the bedside this afternoon.  She just came from EGD and colonoscopy.  Currently comfortable.  Denies any abdomen pain, nausea or vomiting.  Eager to eat food.  Eager to go home.  Objective: Vitals:   11/11/19 1050 11/11/19 1356 11/11/19 1400 11/11/19 1410  BP: (!) 118/49 (!) 118/37 (!) 102/35 (!) 102/35  Pulse: 71 71 71 69  Resp: 13 (!) 23 (!) 24 16  Temp: 97.8 F (36.6 C) 97.7 F (36.5 C)    TempSrc: Axillary Oral    SpO2: 94% 93% 93% 91%  Weight: 87.1 kg     Height: 5\' 3"  (1.6 m)  Intake/Output Summary (Last 24 hours) at 11/11/2019 1455 Last data filed at 11/11/2019 1352 Gross per 24 hour  Intake 600 ml  Output 650 ml  Net -50 ml   Filed Weights   11/11/19 1050  Weight: 87.1 kg    Examination:  General exam: Appears calm and comfortable ,Not in distress, obese HEENT:PERRL,Oral mucosa moist, Ear/Nose normal on gross exam Respiratory system: Bilateral equal air entry, normal vesicular breath sounds, no wheezes or crackles  Cardiovascular system: S1 & S2 heard, RRR. No JVD, murmurs, rubs, gallops or clicks. No pedal edema. Gastrointestinal system: Abdomen is nondistended, soft and nontender. No organomegaly or masses felt. Normal  bowel sounds heard. Central nervous system: Alert and oriented.  Right hemiparesis Extremities: No edema, no clubbing ,no cyanosis, distal peripheral pulses palpable. Skin: No rashes, lesions or ulcers,no icterus ,no pallor   Data Reviewed: I have personally reviewed following labs and imaging studies  CBC: Recent Labs  Lab 11/07/19 1046 11/07/19 1046 11/08/19 0522 11/08/19 0522 11/08/19 1824 11/09/19 0600 11/09/19 1423 11/10/19 0800 11/11/19 0650  WBC 5.6  --  8.0  --   --  6.8  --  8.6 8.3  NEUTROABS  --   --   --   --   --  3.8  --  5.8 5.9  HGB 5.9*   < > 6.6*   < > 7.8* 7.7* 7.6* 7.8* 8.8*  HCT 23.0*   < > 23.9*   < > 28.0* 26.7* 27.6* 28.5* 31.5*  MCV 64.2*  --  64.9*  --   --  67.6*  --  69.2* 70.0*  PLT 265  --  240  --   --  225  --  229 233   < > = values in this interval not displayed.   Basic Metabolic Panel: Recent Labs  Lab 11/07/19 1046 11/08/19 0522 11/09/19 0600 11/10/19 0800 11/11/19 0650  NA 140 141 141 140 141  K 4.0 4.1 3.6 4.0 4.0  CL 106 106 107 107 104  CO2 26 26 28 27 26   GLUCOSE 130* 95 99 95 93  BUN 19 15 15 14 12   CREATININE 1.05* 0.94 1.09* 0.84 0.96  CALCIUM 8.7* 8.6* 8.4* 8.4* 9.0  MG  --   --  2.0 2.0 2.2   GFR: Estimated Creatinine Clearance: 53.8 mL/min (by C-G formula based on SCr of 0.96 mg/dL). Liver Function Tests: Recent Labs  Lab 11/07/19 1046  AST 15  ALT 12  ALKPHOS 79  BILITOT 0.1*  PROT 6.4*  ALBUMIN 3.3*   No results for input(s): LIPASE, AMYLASE in the last 168 hours. No results for input(s): AMMONIA in the last 168 hours. Coagulation Profile: No results for input(s): INR, PROTIME in the last 168 hours. Cardiac Enzymes: No results for input(s): CKTOTAL, CKMB, CKMBINDEX, TROPONINI in the last 168 hours. BNP (last 3 results) No results for input(s): PROBNP in the last 8760 hours. HbA1C: No results for input(s): HGBA1C in the last 72 hours. CBG: Recent Labs  Lab 11/10/19 0805 11/10/19 1151  11/10/19 1615 11/10/19 2053 11/11/19 0743  GLUCAP 86 87 99 95 96   Lipid Profile: No results for input(s): CHOL, HDL, LDLCALC, TRIG, CHOLHDL, LDLDIRECT in the last 72 hours. Thyroid Function Tests: No results for input(s): TSH, T4TOTAL, FREET4, T3FREE, THYROIDAB in the last 72 hours. Anemia Panel: No results for input(s): VITAMINB12, FOLATE, FERRITIN, TIBC, IRON, RETICCTPCT in the last 72 hours. Sepsis Labs: No results for input(s): PROCALCITON, LATICACIDVEN in the last 168  hours.  Recent Results (from the past 240 hour(s))  SARS Coronavirus 2 by RT PCR (hospital order, performed in [facility] hospital lab) Nasopharyngeal Nasopharyngeal Swab     Status: None   Collection Time: 11/07/19 12:24 PM   Specimen: Nasopharyngeal Swab  Result Value Ref Range Status   SARS Coronavirus 2 NEGATIVE NEGATIVE Final    Comment: (NOTE) SARS-CoV-2 target nucleic acids are NOT DETECTED.  The SARS-CoV-2 RNA is generally detectable in upper and lower respiratory specimens during the acute phase of infection. The lowest concentration of SARS-CoV-2 viral copies this assay can detect is 250 copies / mL. A negative result does not preclude SARS-CoV-2 infection and should not be used as the sole basis for treatment or other patient management decisions.  A negative result may occur with improper specimen collection / handling, submission of specimen other than nasopharyngeal swab, presence of viral mutation(s) within the areas targeted by this assay, and inadequate number of viral copies (<250 copies / mL). A negative result must be combined with clinical observations, patient history, and epidemiological information.  Fact Sheet for Patients:   StrictlyIdeas.no  Fact Sheet for Healthcare Providers: BankingDealers.co.za  This test is not yet approved or  cleared by the [place] FDA and has been authorized for detection and/or diagnosis of  SARS-CoV-2 by FDA under an Emergency Use Authorization (EUA).  This EUA will remain in effect (meaning this test can be used) for the duration of the COVID-19 declaration under Section 564(b)(1) of the Act, 21 U.S.C. section 360bbb-3(b)(1), unless the authorization is terminated or revoked sooner.  Performed at [facility], [place] [address], [place], [place] [address]          Radiology Studies: No results found.      Scheduled Meds: . atorvastatin  40 mg Oral QHS  . buPROPion  150 mg Oral QPM  . Chlorhexidine Gluconate Cloth  6 each Topical Daily  . folic acid  1 mg Oral Daily  . insulin aspart  0-5 Units Subcutaneous QHS  . insulin aspart  0-9 Units Subcutaneous TID WC  . multivitamin with minerals  1 tablet Oral Daily  . pantoprazole  40 mg Oral Daily  . polyethylene glycol  17 g Oral Daily  . senna-docusate  1 tablet Oral BID  . sertraline  100 mg Oral QHS  . sodium chloride flush  10-40 mL Intracatheter Q12H  . umeclidinium bromide  1 puff Inhalation Daily  . vitamin B-12  1,000 mcg Oral Daily   Continuous Infusions: . lactated ringers 10 mL/hr at 11/11/19 1053     LOS: 3 days    Time spent: 25 mins,More than 50% of that time was spent in counseling and/or coordination of care.      Shelly Coss, MD [facility] P8/24/2021, 2:55 PM

## 2019-11-11 NOTE — Transfer of Care (Signed)
Immediate Anesthesia Transfer of Care Note  Patient: Brandy Paul  Procedure(s) Performed: COLONOSCOPY WITH PROPOFOL (N/A ) ENTEROSCOPY (N/A ) BIOPSY  Patient Location: Endoscopy Unit  Anesthesia Type:MAC  Level of Consciousness: awake, alert , oriented and patient cooperative  Airway & Oxygen Therapy: Patient Spontanous Breathing and Patient connected to face mask oxygen  Post-op Assessment: Report given to RN, Post -op Vital signs reviewed and stable and Patient moving all extremities  Post vital signs: Reviewed and stable  Last Vitals:  Vitals Value Taken Time  BP    Temp    Pulse    Resp    SpO2      Last Pain:  Vitals:   11/11/19 1050  TempSrc: Axillary  PainSc: 0-No pain         Complications: No complications documented.

## 2019-11-11 NOTE — Care Management Important Message (Signed)
Important Message  Patient Details IM Letter given to the Patient Name: Brandy Paul MRN: [id number] Date of Birth: 03/30/1945   Medicare Important Message Given:  Yes     [name], [name] 11/11/2019, 9:43 AM

## 2019-11-11 NOTE — Interval H&P Note (Signed)
History and Physical Interval Note:  11/11/2019 12:47 PM  Brandy Paul  has presented today for surgery, with the diagnosis of iron deficiency anemia.  The various methods of treatment have been discussed with the patient and family. After consideration of risks, benefits and other options for treatment, the patient has consented to  Procedure(s): COLONOSCOPY WITH PROPOFOL (N/A) ENTEROSCOPY (N/A) as a surgical intervention.  The patient's history has been reviewed, patient examined, no change in status, stable for surgery.  I have reviewed the patient's chart and labs.  Questions were answered to the patient's satisfaction.    Hgb 8.8 today  Nelida Meuse III

## 2019-11-11 NOTE — Anesthesia Preprocedure Evaluation (Addendum)
Anesthesia Evaluation  Patient identified by MRN, date of birth, ID band Patient awake    Reviewed: Allergy & Precautions, NPO status , Patient's Chart, lab work & pertinent test results  Airway Mallampati: III  TM Distance: >3 FB Neck ROM: Full    Dental  (+) Teeth Intact, Dental Advisory Given   Pulmonary COPD, former smoker,    breath sounds clear to auscultation       Cardiovascular hypertension, + CAD   Rhythm:Regular Rate:Normal     Neuro/Psych PSYCHIATRIC DISORDERS Depression TIA Neuromuscular disease CVA    GI/Hepatic Neg liver ROS, GERD  ,  Endo/Other  diabetes  Renal/GU negative Renal ROS     Musculoskeletal negative musculoskeletal ROS (+)   Abdominal   Peds  Hematology   Anesthesia Other Findings   Reproductive/Obstetrics                            Anesthesia Physical Anesthesia Plan  ASA: III  Anesthesia Plan: MAC   Post-op Pain Management:    Induction: Intravenous  PONV Risk Score and Plan: 0 and Propofol infusion  Airway Management Planned:   Additional Equipment:   Intra-op Plan:   Post-operative Plan:   Informed Consent: I have reviewed the patients History and Physical, chart, labs and discussed the procedure including the risks, benefits and alternatives for the proposed anesthesia with the patient or authorized representative who has indicated his/her understanding and acceptance.       Plan Discussed with: CRNA  Anesthesia Plan Comments: (Lab Results      Component                Value               Date                      WBC                      8.3                 11/11/2019                HGB                      8.8 (L)             11/11/2019                HCT                      31.5 (L)            11/11/2019                MCV                      70.0 (L)            11/11/2019                PLT                      233                  11/11/2019            Echo (2019): - Left ventricle: The cavity size was normal. Wall  thickness was  normal. Systolic function was normal. The estimated ejection  fraction was in the range of 50% to 55%. Wall motion was normal;  there were no regional wall motion abnormalities. Doppler  parameters are consistent with abnormal left ventricular  relaxation (grade 1 diastolic dysfunction).  - Mitral valve: Calcified annulus. There was mild regurgitation.  - Tricuspid valve: There was mild-moderate regurgitation. )       Anesthesia Quick Evaluation

## 2019-11-11 NOTE — Anesthesia Postprocedure Evaluation (Signed)
Anesthesia Post Note  Patient: Brandy Paul  Procedure(s) Performed: COLONOSCOPY WITH PROPOFOL (N/A ) ENTEROSCOPY (N/A ) BIOPSY     Patient location during evaluation: PACU Anesthesia Type: MAC Level of consciousness: awake and alert Pain management: pain level controlled Vital Signs Assessment: post-procedure vital signs reviewed and stable Respiratory status: spontaneous breathing, nonlabored ventilation, respiratory function stable and patient connected to nasal cannula oxygen Cardiovascular status: stable and blood pressure returned to baseline Postop Assessment: no apparent nausea or vomiting Anesthetic complications: no   No complications documented.  Last Vitals:  Vitals:   11/11/19 1400 11/11/19 1410  BP: (!) 102/35 (!) 102/35  Pulse: 71 69  Resp: (!) 24 16  Temp:    SpO2: 93% 91%    Last Pain:  Vitals:   11/11/19 1528  TempSrc:   PainSc: 0-No pain                 Effie Berkshire

## 2019-11-11 NOTE — Op Note (Signed)
[facility] Patient Name: Brandy Paul Procedure Date: 11/11/2019 MRN: [id number] Attending MD: Estill Cotta. Loletha Carrow , MD Date of Birth: 07-Sep-1945 CSN: [id number] Age: 74 Admit Type: Inpatient Procedure:                Small bowel enteroscopy Indications:              Unexplained iron deficiency anemia Providers:                Mallie Mussel L. Loletha Carrow, MD, Cleda Daub, RN, Truddie Coco,                            RN, Jefm Miles CRNA Referring MD:             [facility] Medicines:                Monitored Anesthesia Care Complications:            No immediate complications. Estimated Blood Loss:     Estimated blood loss was minimal. Procedure:                Pre-Anesthesia Assessment:                           - Prior to the procedure, a History and Physical                            was performed, and patient medications and                            allergies were reviewed. The patient's tolerance of                            previous anesthesia was also reviewed. The risks                            and benefits of the procedure and the sedation                            options and risks were discussed with the patient.                            All questions were answered, and informed consent                            was obtained. Prior Anticoagulants: The patient has                            taken Plavix (clopidogrel), last dose was 5 days                            prior to procedure. ASA Grade Assessment: III - A                            patient with severe systemic disease. After  reviewing the risks and benefits, the patient was                            deemed in satisfactory condition to undergo the                            procedure.                           After obtaining informed consent, the endoscope was                            passed under direct vision. Throughout the                            procedure, the  patient's blood pressure, pulse, and                            oxygen saturations were monitored continuously. The                            [id number]-[id number] ([id number] Olympus pediatric colonscope                            was introduced through the mouth and advanced to                            the proximal jejunum. The [id number]-H190 ([id number])                            Olympus gastroscope was introduced through the and                            advanced to the. The small bowel enteroscopy was                            somewhat difficult due to abnormal anatomy (hiatal                            hernia limited passage of pediatric colonoscope                            deeper in to the small bowel - looping). The                            patient tolerated the procedure fairly well. After                            removal of the initial scope, the gastroscope was                            introduced through the muoth and advanced to the  stomach to facilitate evaluation of that area. Scope In: Scope Out: Findings:      There was no evidence of significant pathology in the entire examined       portion of jejunum.      The examined duodenum was normal.      Diffuse mucosal changes characterized by erythema and erosion and       thickened folds were found in the gastric fundus and proximal body (the       herniated portion of the stomach). Biopsies were taken with a cold       forceps for histology in the fundus (Jar 2) and antrum (Jar 1).      A large hiatal hernia was present. Mostly intrathoracic stomach. The       gastric folds within it were thickened and deeply erythematous with       scattered erosions. There was retained fluid and scant food debris       within the herniated stomach that was suctioned for better       visualization. Distal esophagus tortuous and foreshortened due to the       hernia. Impression:               - The examined portion of the  jejunum was normal.                           - Normal examined duodenum.                           - Erythema and erosion and thickened folds mucosa                            in the gastric fundus. Biopsied.                           - Large hiatal hernia. Intrathoracic stomach.                           Erosive gastritis in the herniated stomach appears                            to explain the IDA, which needs chronic oral and                            possibly periodic IV iron. Moderate Sedation:      MAC sedation used Recommendation:           - Resume regular diet.                           - Resume Plavix (clopidogrel) at prior dose                            tomorrow.                           - Head of bed at least 30 degrees all times to  decrease chance of aspiration.                           Non-contrast CT scan of chest will be obtained to                            better define extent of hiatal hernia. However,                            patient's chronic medical condition may preclude                            surgical repair.                           - Return patient to hospital ward for ongoing care.                            After CT scan is done, patient can be discharged                            home from a GI perspective. Outpatient follow up                            with Dr. Silverio Decamp will be arranged. Procedure Code(s):        --- Professional ---                           [phone], Small intestinal endoscopy, enteroscopy                            beyond second portion of duodenum, not including                            ileum; with biopsy, single or multiple Diagnosis Code(s):        --- Professional ---                           K44.9, Diaphragmatic hernia without obstruction or                            gangrene                           D50.9, Iron deficiency anemia, unspecified CPT copyright 2019 American Medical  Association. All rights reserved. The codes documented in this report are preliminary and upon coder review may  be revised to meet current compliance requirements. Kyle Stansell L. Loletha Carrow, MD 11/11/2019 1:58:52 PM This report has been signed electronically. Number of Addenda: 0

## 2019-11-11 NOTE — Op Note (Signed)
[facility] Patient Name: Brandy Paul Procedure Date: 11/11/2019 MRN: [id number] Attending MD: Estill Cotta. Loletha Carrow , MD Date of Birth: 11-26-1945 CSN: [id number] Age: 74 Admit Type: Inpatient Procedure:                Colonoscopy Indications:              Unexplained iron deficiency anemia Providers:                Mallie Mussel L. Loletha Carrow, MD, Cleda Daub, RN, Truddie Coco,                            RN, Jefm Miles CRNA Referring MD:             [facility] Medicines:                Monitored Anesthesia Care Complications:            No immediate complications. Estimated Blood Loss:     Estimated blood loss: none. Procedure:                Pre-Anesthesia Assessment:                           - Prior to the procedure, a History and Physical                            was performed, and patient medications and                            allergies were reviewed. The patient's tolerance of                            previous anesthesia was also reviewed. The risks                            and benefits of the procedure and the sedation                            options and risks were discussed with the patient.                            All questions were answered, and informed consent                            was obtained. Prior Anticoagulants: The patient has                            taken Plavix (clopidogrel), last dose was 5 days                            prior to procedure. ASA Grade Assessment: III - A                            patient with severe systemic disease. After  reviewing the risks and benefits, the patient was                            deemed in satisfactory condition to undergo the                            procedure.                           After obtaining informed consent, the colonoscope                            was passed under direct vision. Throughout the                            procedure, the patient's blood  pressure, pulse, and                            oxygen saturations were monitored continuously. The                            CF-HQ190L ([id number]) Olympus colonoscope was                            introduced through the anus and advanced to the the                            terminal ileum, with identification of the                            appendiceal orifice and IC valve. The colonoscopy                            was performed without difficulty. The patient                            tolerated the procedure well. The quality of the                            bowel preparation was initially fair, improved with                            lavage for good visualization in right colon but                            continued fair visualization in left colon. The                            terminal ileum, ileocecal valve, appendiceal                            orifice, and rectum were photographed. Scope In: 1:02:37 PM Scope Out: 1:15:40 PM Scope Withdrawal Time: 0 hours 9 minutes 51 seconds  Total Procedure Duration: 0 hours 13 minutes 3  seconds  Findings:      The digital rectal exam findings include decreased sphincter tone.      The terminal ileum appeared normal.      Many diverticula were found in the left colon.      Stool was found in the sigmoid colon, interfering with visualization.      The exam was otherwise without abnormality on direct and retroflexion       views. Impression:               - Preparation of the colon was fair.                           - Decreased sphincter tone found on digital rectal                            exam.                           - The examined portion of the ileum was normal.                           - Diverticulosis in the left colon.                           - Stool in the sigmoid colon.                           - The examination was otherwise normal on direct                            and retroflexion views.                            - No specimens collected. Moderate Sedation:      MAC sedation used Recommendation:           - Return patient to hospital ward for ongoing care.                           - Resume regular diet.                           - No recommendation at this time regarding repeat                            colonoscopy.                           - See the other procedure note for documentation of                            additional recommendations. Procedure Code(s):        --- Professional ---                           [phone], Colonoscopy, flexible; diagnostic, including  collection of specimen(s) by brushing or washing,                            when performed (separate procedure) Diagnosis Code(s):        --- Professional ---                           K62.89, Other specified diseases of anus and rectum                           D50.9, Iron deficiency anemia, unspecified                           K57.30, Diverticulosis of large intestine without                            perforation or abscess without bleeding CPT copyright 2019 American Medical Association. All rights reserved. The codes documented in this report are preliminary and upon coder review may  be revised to meet current compliance requirements. Zuriah Bordas L. Loletha Carrow, MD 11/11/2019 1:44:02 PM This report has been signed electronically. Number of Addenda: 0

## 2019-11-11 NOTE — Plan of Care (Signed)
°  Problem: Clinical Measurements: °Goal: Will remain free from infection °Outcome: Progressing °  °Problem: Activity: °Goal: Risk for activity intolerance will decrease °Outcome: Progressing °  °Problem: Nutrition: °Goal: Adequate nutrition will be maintained °Outcome: Progressing °  °

## 2019-11-12 ENCOUNTER — Other Ambulatory Visit: Payer: Self-pay

## 2019-11-12 ENCOUNTER — Encounter (HOSPITAL_COMMUNITY): Payer: Self-pay | Admitting: Gastroenterology

## 2019-11-12 ENCOUNTER — Encounter: Payer: Self-pay | Admitting: Gastroenterology

## 2019-11-12 LAB — SURGICAL PATHOLOGY

## 2019-11-12 LAB — CBC WITH DIFFERENTIAL/PLATELET
Abs Immature Granulocytes: 0.04 10*3/uL (ref 0.00–0.07)
Basophils Absolute: 0 10*3/uL (ref 0.0–0.1)
Basophils Relative: 0 %
Eosinophils Absolute: 0.3 10*3/uL (ref 0.0–0.5)
Eosinophils Relative: 4 %
HCT: 29.9 % — ABNORMAL LOW (ref 36.0–46.0)
Hemoglobin: 8.3 g/dL — ABNORMAL LOW (ref 12.0–15.0)
Immature Granulocytes: 1 %
Lymphocytes Relative: 18 %
Lymphs Abs: 1.3 10*3/uL (ref 0.7–4.0)
MCH: 19.8 pg — ABNORMAL LOW (ref 26.0–34.0)
MCHC: 27.8 g/dL — ABNORMAL LOW (ref 30.0–36.0)
MCV: 71.4 fL — ABNORMAL LOW (ref 80.0–100.0)
Monocytes Absolute: 0.9 10*3/uL (ref 0.1–1.0)
Monocytes Relative: 13 %
Neutro Abs: 4.6 10*3/uL (ref 1.7–7.7)
Neutrophils Relative %: 64 %
Platelets: 213 10*3/uL (ref 150–400)
RBC: 4.19 MIL/uL (ref 3.87–5.11)
RDW: 28.2 % — ABNORMAL HIGH (ref 11.5–15.5)
WBC: 7.1 10*3/uL (ref 4.0–10.5)
nRBC: 0 % (ref 0.0–0.2)

## 2019-11-12 LAB — GLUCOSE, CAPILLARY
Glucose-Capillary: 95 mg/dL (ref 70–99)
Glucose-Capillary: 101 mg/dL — ABNORMAL HIGH (ref 70–99)

## 2019-11-12 MED ORDER — FERROUS SULFATE 325 (65 FE) MG PO TABS
325.0000 mg | ORAL_TABLET | Freq: Every day | ORAL | 3 refills | Status: DC
Start: 2019-11-13 — End: 2020-02-04

## 2019-11-12 MED ORDER — PANTOPRAZOLE SODIUM 40 MG PO TBEC
40.0000 mg | DELAYED_RELEASE_TABLET | Freq: Every day | ORAL | 1 refills | Status: DC
Start: 2019-11-12 — End: 2020-02-04

## 2019-11-12 NOTE — Discharge Summary (Signed)
Physician Discharge Summary  Brandy Paul YHC:[id number] DOB: [birth year] DOA: 11/07/2019  PCP: Velna Hatchet, MD  Admit date: 11/07/2019 Discharge date: 11/12/2019  Admitted From: Home Disposition:  Home  Discharge Condition:Stable CODE STATUS:FULL Diet recommendation: Heart Healthy   Brief/Interim Summary:  Patient is a 20 female with history of chronic polyps, internal hemorrhoids, iron deficiency anemia, coronary artery disease, CVA with residual right-sided weakness, COPD who presents to the emergency department after her PCP referred her for evaluation of anemia.  On presentation she had hemoglobin of 6 with heme positive stools.  She was transfused with PRBC during this hospitalization.  She underwent EGD and colonoscopy on 11/11/2019 and found to have erosive gastritis, which was likely associated with hiatal hernia.  Her hemoglobin is stable for discharge home today.  She'll follow-up with GI as an outpatient.  Following problems were addressed during her hospitalization:  Upper GI bleed: She was referred by her PCP for evaluation of anemia.  FOBT was positive. She had history of polyps.  She was found to have  iron deficiency anemia so GI was consulted. Underwent EGD and colonoscopy on 11/11/2019 with finding of erosive gastritis, hiatal hernia. She has been started on previous diet.  Currently hemoglobin stable in the range of 8.  She was transfused with PRBC during this hospitalization. Iron studies had shown  low iron.Started on iron supplementation.  Hiatal hernia: Finding as per EGD.   CT confirmed large hiatal hernia,she is not a candidate for surgery.  Nonhemorrhagic CVA: Has right residual deficits.  Ambulates with the help of walker/cane at home.  Follows with neurology.  On aspirin and Plavix at home.  Diabetes type 2:  Continue home medications on discharge.  COPD: Currently not in exacerbation.    On inhalers at home.  Hypertension: Currently blood pressure is   well controlled.  Resume home medications on discharge.  Depression: Continue Wellbutrin, Zoloft  Obesity: BMI of 34   Discharge Diagnoses:  Active Problems:   CVA, old, aphasia   DM (diabetes mellitus), type 2, uncontrolled with complications (HCC)   Anemia, iron deficiency   Essential hypertension   GI bleed   COPD (chronic obstructive pulmonary disease) (HCC)   Depression   GIB (gastrointestinal bleeding)    Discharge Instructions  Discharge Instructions    Diet - low sodium heart healthy   Complete by: As directed    Discharge instructions   Complete by: As directed    1)Please follow-up with your PCP in a week.  Do a CBC test during the follow-up to check your hemoglobin. 2)You will be called by gastroenterology for the follow-up appointment 3)Take prescribed medications as instructed.   Increase activity slowly   Complete by: As directed      Allergies as of 11/12/2019      Reactions   Ceclor [cefaclor] Other (See Comments)   REACTION: "SEVERE HEADACHE"      Medication List    STOP taking these medications   RABEprazole 20 MG tablet Commonly known as: ACIPHEX     TAKE these medications   acetaminophen 500 MG tablet Commonly known as: TYLENOL Take 500 mg by mouth daily as needed for mild pain.   aspirin EC 81 MG tablet Take 81 mg by mouth daily.   atorvastatin 40 MG tablet Commonly known as: LIPITOR Take 40 mg by mouth at bedtime.   buPROPion 150 MG 12 hr tablet Commonly known as: WELLBUTRIN SR Take 150 mg by mouth every evening.   calcium carbonate  1500 (600 Ca) MG Tabs tablet Commonly known as: OSCAL Take 600 mg of elemental calcium by mouth daily.   clopidogrel 75 MG tablet Commonly known as: PLAVIX Take 1 tablet (75 mg total) by mouth at bedtime.   ezetimibe 10 MG tablet Commonly known as: ZETIA Take 10 mg by mouth at bedtime.   ferrous sulfate 325 (65 FE) MG tablet Take 1 tablet (325 mg total) by mouth daily with breakfast. Start  taking on: November 13, 2019   hydrocortisone 25 MG suppository Commonly known as: ANUSOL-HC Place 1 suppository (25 mg total) rectally 2 (two) times daily as needed for hemorrhoids or anal itching. Avoid using more than 7 days at a time   isosorbide mononitrate 60 MG 24 hr tablet Commonly known as: IMDUR TAKE 1 TABLET BY MOUTH EVERY DAY   nitroGLYCERIN 0.4 MG SL tablet Commonly known as: NITROSTAT Place 1 tablet (0.4 mg total) under the tongue every 5 (five) minutes as needed for chest pain (Call 911 at 3rd dose within 15 minutes).   pantoprazole 40 MG tablet Commonly known as: PROTONIX Take 1 tablet (40 mg total) by mouth daily.   sertraline 100 MG tablet Commonly known as: ZOLOFT Take 100 mg by mouth at bedtime.   Spiriva HandiHaler 18 MCG inhalation capsule Generic drug: tiotropium Place 18 mcg into inhaler and inhale every evening.       Follow-up Information    Velna Hatchet, MD. Schedule an appointment as soon as possible for a visit in 1 week(s).   Specialty: Internal Medicine Contact information: [place] [phone]              Allergies  Allergen Reactions  . Ceclor [Cefaclor] Other (See Comments)    REACTION: "SEVERE HEADACHE"    Consultations:  GI   Procedures/Studies: CT CHEST WO CONTRAST  Result Date: 11/11/2019 CLINICAL DATA:  Respiratory failure, history of breast carcinoma EXAM: CT CHEST WITHOUT CONTRAST TECHNIQUE: Multidetector CT imaging of the chest was performed following the standard protocol without IV contrast. COMPARISON:  09/17/2017 FINDINGS: Cardiovascular: Thoracic aorta demonstrates atherosclerotic calcifications without aneurysmal dilatation. No cardiac enlargement is noted. No significant coronary calcifications are seen. Pulmonary artery as visualized is within normal limits. Left-sided PICC line is noted extending into the proximal superior vena cava. Mediastinum/Nodes: Thoracic inlet shows a  small 8 mm hypodensity in the left lobe of the thyroid. No sizable hilar or mediastinal adenopathy is noted. The esophagus as visualized is within normal limits. A large hiatal hernia is noted. Lungs/Pleura: Lungs are well aerated bilaterally. Chronic scarring is noted in the bases bilaterally similar to that seen on prior exam from 2019. Small chronic effusion is noted as well. No new focal infiltrate is seen. Upper Abdomen: Changes of prior cholecystectomy are noted. No other focal abnormality is seen. Musculoskeletal: Left breast implant is noted consistent with the given clinical history of breast carcinoma. Postsurgical changes in the axilla are seen. No acute bony abnormality is noted. IMPRESSION: Large hiatal hernia. There are chronic changes in the right base likely related to the hernia stable from 2019. Scarring in the left base is noted as well. No acute abnormality to correspond with the patient's given clinical history is noted. 8 mm hypodensity in the left lobe of the thyroid. No followup recommended (ref: J Am Coll Radiol. 2015 Feb;12(2): 143-50). Aortic Atherosclerosis (ICD10-I70.0). Electronically Signed   By: Inez Catalina M.D.   On: 11/11/2019 17:05   [place] EKG SITE RITE  Result  Date: 11/07/2019 If [facility] image not attached, placement could not be confirmed due to current cardiac rhythm.      Subjective: Patient seen and examined the bedside this morning.  Hemodynamically stable for discharge today.  Discharge Exam: Vitals:   11/11/19 2121 11/12/19 0311  BP: (!) 101/45 (!) 121/46  Pulse: 85 74  Resp: 18 18  Temp: 99.3 F (37.4 C) 98.6 F (37 C)  SpO2: 95% 93%   Vitals:   11/11/19 1400 11/11/19 1410 11/11/19 2121 11/12/19 0311  BP: (!) 102/35 (!) 102/35 (!) 101/45 (!) 121/46  Pulse: 71 69 85 74  Resp: (!) 24 16 18 18   Temp:   99.3 F (37.4 C) 98.6 F (37 C)  TempSrc:   Oral Oral  SpO2: 93% 91% 95% 93%  Weight:      Height:        General: Pt is alert, awake, not  in acute distress Cardiovascular: RRR, S1/S2 +, no rubs, no gallops Respiratory: CTA bilaterally, no wheezing, no rhonchi Abdominal: Soft, NT, ND, bowel sounds + Extremities: no edema, no cyanosis    The results of significant diagnostics from this hospitalization (including imaging, microbiology, ancillary and laboratory) are listed below for reference.     Microbiology: Recent Results (from the past 240 hour(s))  SARS Coronavirus 2 by RT PCR (hospital order, performed in [facility] hospital lab) Nasopharyngeal Nasopharyngeal Swab     Status: None   Collection Time: 11/07/19 12:24 PM   Specimen: Nasopharyngeal Swab  Result Value Ref Range Status   SARS Coronavirus 2 NEGATIVE NEGATIVE Final    Comment: (NOTE) SARS-CoV-2 target nucleic acids are NOT DETECTED.  The SARS-CoV-2 RNA is generally detectable in upper and lower respiratory specimens during the acute phase of infection. The lowest concentration of SARS-CoV-2 viral copies this assay can detect is 250 copies / mL. A negative result does not preclude SARS-CoV-2 infection and should not be used as the sole basis for treatment or other patient management decisions.  A negative result may occur with improper specimen collection / handling, submission of specimen other than nasopharyngeal swab, presence of viral mutation(s) within the areas targeted by this assay, and inadequate number of viral copies (<250 copies / mL). A negative result must be combined with clinical observations, patient history, and epidemiological information.  Fact Sheet for Patients:   StrictlyIdeas.no  Fact Sheet for Healthcare Providers: BankingDealers.co.za  This test is not yet approved or  cleared by the [place] FDA and has been authorized for detection and/or diagnosis of SARS-CoV-2 by FDA under an Emergency Use Authorization (EUA).  This EUA will remain in effect (meaning this test can be  used) for the duration of the COVID-19 declaration under Section 564(b)(1) of the Act, 21 U.S.C. section 360bbb-3(b)(1), unless the authorization is terminated or revoked sooner.  Performed at [facility], [place] [address] [place], [place] [address]      Labs: BNP (last 3 results) No results for input(s): BNP in the last 8760 hours. Basic Metabolic Panel: Recent Labs  Lab 11/07/19 1046 11/08/19 0522 11/09/19 0600 11/10/19 0800 11/11/19 0650  NA 140 141 141 140 141  K 4.0 4.1 3.6 4.0 4.0  CL 106 106 107 107 104  CO2 26 26 28 27 26   GLUCOSE 130* 95 99 95 93  BUN 19 15 15 14 12   CREATININE 1.05* 0.94 1.09* 0.84 0.96  CALCIUM 8.7* 8.6* 8.4* 8.4* 9.0  MG  --   --  2.0  2.0 2.2   Liver Function Tests: Recent Labs  Lab 11/07/19 1046  AST 15  ALT 12  ALKPHOS 79  BILITOT 0.1*  PROT 6.4*  ALBUMIN 3.3*   No results for input(s): LIPASE, AMYLASE in the last 168 hours. No results for input(s): AMMONIA in the last 168 hours. CBC: Recent Labs  Lab 11/08/19 0522 11/08/19 1824 11/09/19 0600 11/09/19 1423 11/10/19 0800 11/11/19 0650 11/12/19 0625  WBC 8.0  --  6.8  --  8.6 8.3 7.1  NEUTROABS  --   --  3.8  --  5.8 5.9 4.6  HGB 6.6*   < > 7.7* 7.6* 7.8* 8.8* 8.3*  HCT 23.9*   < > 26.7* 27.6* 28.5* 31.5* 29.9*  MCV 64.9*  --  67.6*  --  69.2* 70.0* 71.4*  PLT 240  --  225  --  229 233 213   < > = values in this interval not displayed.   Cardiac Enzymes: No results for input(s): CKTOTAL, CKMB, CKMBINDEX, TROPONINI in the last 168 hours. BNP: Invalid input(s): POCBNP CBG: Recent Labs  Lab 11/10/19 2053 11/11/19 0743 11/11/19 1718 11/11/19 2122 11/12/19 0740  GLUCAP 95 96 100* 131* 95   D-Dimer No results for input(s): DDIMER in the last 72 hours. Hgb A1c No results for input(s): HGBA1C in the last 72 hours. Lipid Profile No results for input(s): CHOL, HDL, LDLCALC, TRIG, CHOLHDL, LDLDIRECT in the last 72 hours. Thyroid function studies No  results for input(s): TSH, T4TOTAL, T3FREE, THYROIDAB in the last 72 hours.  Invalid input(s): FREET3 Anemia work up No results for input(s): VITAMINB12, FOLATE, FERRITIN, TIBC, IRON, RETICCTPCT in the last 72 hours. Urinalysis    Component Value Date/Time   COLORURINE YELLOW 09/17/2017 1256   APPEARANCEUR CLEAR 09/17/2017 1256   LABSPEC 1.019 09/17/2017 1256   PHURINE 5.0 09/17/2017 1256   GLUCOSEU NEGATIVE 09/17/2017 1256   HGBUR NEGATIVE 09/17/2017 1256   BILIRUBINUR NEGATIVE 09/17/2017 1256   KETONESUR NEGATIVE 09/17/2017 1256   PROTEINUR NEGATIVE 09/17/2017 1256   NITRITE NEGATIVE 09/17/2017 1256   LEUKOCYTESUR NEGATIVE 09/17/2017 1256   Sepsis Labs Invalid input(s): PROCALCITONIN,  WBC,  LACTICIDVEN Microbiology Recent Results (from the past 240 hour(s))  SARS Coronavirus 2 by RT PCR (hospital order, performed in [place] hospital lab) Nasopharyngeal Nasopharyngeal Swab     Status: None   Collection Time: 11/07/19 12:24 PM   Specimen: Nasopharyngeal Swab  Result Value Ref Range Status   SARS Coronavirus 2 NEGATIVE NEGATIVE Final    Comment: (NOTE) SARS-CoV-2 target nucleic acids are NOT DETECTED.  The SARS-CoV-2 RNA is generally detectable in upper and lower respiratory specimens during the acute phase of infection. The lowest concentration of SARS-CoV-2 viral copies this assay can detect is 250 copies / mL. A negative result does not preclude SARS-CoV-2 infection and should not be used as the sole basis for treatment or other patient management decisions.  A negative result may occur with improper specimen collection / handling, submission of specimen other than nasopharyngeal swab, presence of viral mutation(s) within the areas targeted by this assay, and inadequate number of viral copies (<250 copies / mL). A negative result must be combined with clinical observations, patient history, and epidemiological information.  Fact Sheet for Patients:    StrictlyIdeas.no  Fact Sheet for Healthcare Providers: BankingDealers.co.za  This test is not yet approved or  cleared by the [place] FDA and has been authorized for detection and/or diagnosis of SARS-CoV-2 by FDA under an Emergency Use  Authorization (EUA).  This EUA will remain in effect (meaning this test can be used) for the duration of the COVID-19 declaration under Section 564(b)(1) of the Act, 21 U.S.C. section 360bbb-3(b)(1), unless the authorization is terminated or revoked sooner.  Performed at [facility], [place] [address], [place], [place] [address]     Please note: You were cared for by a hospitalist during your hospital stay. Once you are discharged, your primary care physician will handle any further medical issues. Please note that NO REFILLS for any discharge medications will be authorized once you are discharged, as it is imperative that you return to your primary care physician (or establish a relationship with a primary care physician if you do not have one) for your post hospital discharge needs so that they can reassess your need for medications and monitor your lab values.    Time coordinating discharge: 40 minutes  SIGNED:   Shelly Coss, MD  [facility] 11/12/2019, 11:01 AM Pager [phone]  If 7PM-7AM, please contact night-coverage [email or web address] Password [email or web address]

## 2019-11-12 NOTE — Progress Notes (Signed)
I discussed the case with Dr. Tawanna Solo of [facility]. Patient can be discharged home from a GI perspective with oral iron and once daily protonix. CT chest shows large HH without volvulus. I still suspect she is not likely to be a surgical candidate due to medical comorbidities.  I have messaged Dr. Silverio Decamp to arrange office follow up. Patient needs PCP follow up in 2-3 weeks to check CBC  Signing off - call as needed.  - H. Loletha Carrow, MD

## 2019-11-14 ENCOUNTER — Telehealth: Payer: Self-pay | Admitting: Gastroenterology

## 2019-11-14 NOTE — Telephone Encounter (Signed)
Pts appt moved to 01/06/20@9 :50am. Pts husband aware of appt

## 2019-11-21 DIAGNOSIS — K922 Gastrointestinal hemorrhage, unspecified: Secondary | ICD-10-CM | POA: Diagnosis not present

## 2019-11-21 DIAGNOSIS — I69961 Other paralytic syndrome following unspecified cerebrovascular disease affecting right dominant side: Secondary | ICD-10-CM | POA: Diagnosis not present

## 2019-11-21 DIAGNOSIS — J449 Chronic obstructive pulmonary disease, unspecified: Secondary | ICD-10-CM | POA: Diagnosis not present

## 2019-11-21 DIAGNOSIS — K449 Diaphragmatic hernia without obstruction or gangrene: Secondary | ICD-10-CM | POA: Diagnosis not present

## 2019-11-21 DIAGNOSIS — D509 Iron deficiency anemia, unspecified: Secondary | ICD-10-CM | POA: Diagnosis not present

## 2019-11-21 DIAGNOSIS — K5900 Constipation, unspecified: Secondary | ICD-10-CM | POA: Diagnosis not present

## 2019-11-21 DIAGNOSIS — E1169 Type 2 diabetes mellitus with other specified complication: Secondary | ICD-10-CM | POA: Diagnosis not present

## 2019-11-28 DIAGNOSIS — E785 Hyperlipidemia, unspecified: Secondary | ICD-10-CM | POA: Diagnosis not present

## 2019-12-10 DIAGNOSIS — Z20828 Contact with and (suspected) exposure to other viral communicable diseases: Secondary | ICD-10-CM | POA: Diagnosis not present

## 2019-12-30 ENCOUNTER — Ambulatory Visit: Payer: Medicare Other | Admitting: Gastroenterology

## 2020-01-06 ENCOUNTER — Ambulatory Visit: Payer: Medicare Other | Admitting: Gastroenterology

## 2020-01-29 DIAGNOSIS — Z23 Encounter for immunization: Secondary | ICD-10-CM | POA: Diagnosis not present

## 2020-02-04 ENCOUNTER — Ambulatory Visit (INDEPENDENT_AMBULATORY_CARE_PROVIDER_SITE_OTHER): Payer: Medicare Other | Admitting: Physician Assistant

## 2020-02-04 ENCOUNTER — Encounter: Payer: Self-pay | Admitting: Physician Assistant

## 2020-02-04 ENCOUNTER — Other Ambulatory Visit (INDEPENDENT_AMBULATORY_CARE_PROVIDER_SITE_OTHER): Payer: Medicare Other

## 2020-02-04 VITALS — BP 108/60 | HR 68 | Ht 63.0 in | Wt 195.0 lb

## 2020-02-04 DIAGNOSIS — K2970 Gastritis, unspecified, without bleeding: Secondary | ICD-10-CM

## 2020-02-04 DIAGNOSIS — K449 Diaphragmatic hernia without obstruction or gangrene: Secondary | ICD-10-CM | POA: Diagnosis not present

## 2020-02-04 DIAGNOSIS — K219 Gastro-esophageal reflux disease without esophagitis: Secondary | ICD-10-CM

## 2020-02-04 DIAGNOSIS — I201 Angina pectoris with documented spasm: Secondary | ICD-10-CM | POA: Diagnosis not present

## 2020-02-04 LAB — CBC WITH DIFFERENTIAL/PLATELET
Basophils Absolute: 0 10*3/uL (ref 0.0–0.1)
Basophils Relative: 0.9 % (ref 0.0–3.0)
Eosinophils Absolute: 0.3 10*3/uL (ref 0.0–0.7)
Eosinophils Relative: 7.1 % — ABNORMAL HIGH (ref 0.0–5.0)
HCT: 35.9 % — ABNORMAL LOW (ref 36.0–46.0)
Hemoglobin: 11.5 g/dL — ABNORMAL LOW (ref 12.0–15.0)
Lymphocytes Relative: 27.1 % (ref 12.0–46.0)
Lymphs Abs: 1.1 10*3/uL (ref 0.7–4.0)
MCHC: 32 g/dL (ref 30.0–36.0)
MCV: 87.9 fl (ref 78.0–100.0)
Monocytes Absolute: 0.4 10*3/uL (ref 0.1–1.0)
Monocytes Relative: 10.2 % (ref 3.0–12.0)
Neutro Abs: 2.2 10*3/uL (ref 1.4–7.7)
Neutrophils Relative %: 54.7 % (ref 43.0–77.0)
Platelets: 141 10*3/uL — ABNORMAL LOW (ref 150.0–400.0)
RBC: 4.08 Mil/uL (ref 3.87–5.11)
RDW: 15.5 % (ref 11.5–15.5)
WBC: 4 10*3/uL (ref 4.0–10.5)

## 2020-02-04 MED ORDER — PANTOPRAZOLE SODIUM 40 MG PO TBEC
40.0000 mg | DELAYED_RELEASE_TABLET | Freq: Every day | ORAL | 11 refills | Status: DC
Start: 2020-02-04 — End: 2022-12-27

## 2020-02-04 MED ORDER — FERROUS SULFATE 325 (65 FE) MG PO TABS: 325.0000 mg | ORAL_TABLET | Freq: Every day | ORAL | 11 refills | Status: AC

## 2020-02-04 NOTE — Patient Instructions (Signed)
If you are age 75 or older, your body mass index should be between 23-30. Your Body mass index is 34.54 kg/m. If this is out of the aforementioned range listed, please consider follow up with your Primary Care Provider.  If you are age 26 or younger, your body mass index should be between 19-25. Your Body mass index is 34.54 kg/m. If this is out of the aformentioned range listed, please consider follow up with your Primary Care Provider.   Your provider has requested that you go to the basement level for lab work before leaving today. Press "B" on the elevator. The lab is located at the first door on the left as you exit the elevator.  Continue Pantoprazole and Iron  Follow up pending at this time or as needed.

## 2020-02-04 NOTE — Progress Notes (Signed)
Subjective:    Patient ID: Brandy Paul, female    DOB: 09/26/1945, 74 y.o.   MRN: [id number]  HPI Brandy Paul is a pleasant 74 year old white female, established with Dr. Silverio Decamp, who comes in today for follow-up.  She had been hospitalized in late August 2021 with symptomatic profound anemia, hemoglobin of 6, found to be iron deficient and heme positive.  She required transfusions, underwent colonoscopy which showed multiple diverticuli, some retained stool in the sigmoid colon but otherwise negative exam.  Enteroscopy on 11/11/2019 with finding of diffuse gastric erythema and erosions and a mostly intrathoracic stomach with very large hiatal hernia and tortuous distal esophagus.  Biopsies negative for H. pylori did show some parietal cell hyperplasia. The gastric findings were felt to explain the iron deficiency anemia. This occurred in setting of Plavix and aspirin. Hemoglobin was 8.3 on discharge from hospital, ferritin 5/serum iron 16 iron sat of 4. She was discharged on oral iron 325 mg p.o. daily, and pantoprazole 40 mg p.o. every morning. Patient's husband says she has been doing well over the past couple of months has had no complaints of heartburn or indigestion, no nausea or vomiting and no abdominal pain.  They have not noted any melena or hematochezia.  She has not had any labs checked since discharge from hospital. She has history of CVA with right hemiparesis, carotid stenosis, COPD, adult onset diabetes mellitus and is status post cholecystectomy.  Review of Systems Pertinent positive and negative review of systems were noted in the above HPI section.  All other review of systems was otherwise negative.  Outpatient Encounter Medications as of 02/04/2020  Medication Sig  . acetaminophen (TYLENOL) 500 MG tablet Take 500 mg by mouth daily as needed for mild pain.   Marland Kitchen aspirin EC 81 MG tablet Take 81 mg by mouth daily.  Marland Kitchen atorvastatin (LIPITOR) 40 MG tablet Take 40 mg by mouth at  bedtime.   Marland Kitchen buPROPion (WELLBUTRIN SR) 150 MG 12 hr tablet Take 150 mg by mouth every evening.   . calcium carbonate (OSCAL) 1500 (600 Ca) MG TABS tablet Take 600 mg of elemental calcium by mouth daily.  . clopidogrel (PLAVIX) 75 MG tablet Take 1 tablet (75 mg total) by mouth at bedtime.  Marland Kitchen ezetimibe (ZETIA) 10 MG tablet Take 10 mg by mouth at bedtime.   . ferrous sulfate 325 (65 FE) MG tablet Take 1 tablet (325 mg total) by mouth daily with breakfast.  . isosorbide mononitrate (IMDUR) 60 MG 24 hr tablet TAKE 1 TABLET BY MOUTH EVERY DAY (Patient taking differently: Take 60 mg by mouth daily. )  . nitroGLYCERIN (NITROSTAT) 0.4 MG SL tablet Place 1 tablet (0.4 mg total) under the tongue every 5 (five) minutes as needed for chest pain (Call 911 at 3rd dose within 15 minutes).  . pantoprazole (PROTONIX) 40 MG tablet Take 1 tablet (40 mg total) by mouth daily.  . sertraline (ZOLOFT) 100 MG tablet Take 100 mg by mouth at bedtime.   Marland Kitchen tiotropium (SPIRIVA HANDIHALER) 18 MCG inhalation capsule Place 18 mcg into inhaler and inhale every evening.   . [DISCONTINUED] ferrous sulfate 325 (65 FE) MG tablet Take 1 tablet (325 mg total) by mouth daily with breakfast.  . [DISCONTINUED] pantoprazole (PROTONIX) 40 MG tablet Take 1 tablet (40 mg total) by mouth daily.  . [DISCONTINUED] hydrocortisone (ANUSOL-HC) 25 MG suppository Place 1 suppository (25 mg total) rectally 2 (two) times daily as needed for hemorrhoids or anal itching. Avoid using more than  7 days at a time (Patient not taking: Reported on 11/07/2019)   No facility-administered encounter medications on file as of 02/04/2020.   Allergies  Allergen Reactions  . Ceclor [Cefaclor] Other (See Comments)    REACTION: "SEVERE HEADACHE"   Patient Active Problem List   Diagnosis Date Noted  . GIB (gastrointestinal bleeding) 11/08/2019  . COPD (chronic obstructive pulmonary disease) ([place])   . Depression   . GI bleed 11/07/2019  . S/P laparoscopic  cholecystectomy 12/10/2017  . Transient speech disturbance   . Stenosis of both middle cerebral arteries   . History of stroke   . Essential hypertension   . TIA (transient ischemic attack) 09/17/2017  . Acute cholecystitis 07/12/2017  . Appendicitis 05/10/2017  . DOE (dyspnea on exertion) 04/22/2017  . Anemia 12/02/2015  . Ovarian mass, left 11/04/2015  . Pelvic mass in female 11/04/2015  . Iron deficiency anemia 04/28/2015  . Nonspecific abnormal finding in stool contents 04/06/2015  . Anemia, iron deficiency 04/06/2015  . Encounter for long-term (current) use of antiplatelets/antithrombotics 04/06/2015  . Hyperlipidemia 11/24/2014  . Hemiparesis affecting right side as late effect of cerebrovascular accident ([place]) 11/24/2014  . Coronary artery spasm ([place]) 10/09/2013  . 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 DM (diabetes mellitus), type 2, uncontrolled with complications ([place]) 48/18/5631  . Gastroesophageal reflux disease without esophagitis 10/09/2013  . Centrilobular emphysema ([place]) 10/09/2013   Social History   Socioeconomic History  . Marital status: Married    Spouse name: Brandy Paul  . Number of children: 4  . Years of education: 40  . Highest education level: Not on file  Occupational History  . Occupation: retired  Tobacco Use  . Smoking status: Former Smoker    Types: Cigarettes    Quit date: 02/17/1994    Years since quitting: 25.9  . Smokeless tobacco: Never Used  Vaping Use  . Vaping Use: Never used  Substance and Sexual Activity  . Alcohol use: No    Alcohol/week: 0.0 standard drinks  . Drug use: No  . Sexual activity: Not Currently  Other Topics Concern  . Not on file  Social History Narrative   Lives with spouse   1-2 cups coffee   Social Determinants of Health   Financial Resource Strain:   . Difficulty of Paying Living Expenses: Not on file  Food Insecurity:   . Worried About Charity fundraiser in the Last Year: Not on file  . Ran Out of Food in the  Last Year: Not on file  Transportation Needs:   . Lack of Transportation (Medical): Not on file  . Lack of Transportation (Non-Medical): Not on file  Physical Activity:   . Days of Exercise per Week: Not on file  . Minutes of Exercise per Session: Not on file  Stress:   . Feeling of Stress : Not on file  Social Connections:   . Frequency of Communication with Friends and Family: Not on file  . Frequency of Social Gatherings with Friends and Family: Not on file  . Attends Religious Services: Not on file  . Active Member of Clubs or Organizations: Not on file  . Attends Archivist Meetings: Not on file  . Marital Status: Not on file  Intimate Partner Violence:   . Fear of Current or Ex-Partner: Not on file  . Emotionally Abused: Not on file  . Physically Abused: Not on file  . Sexually Abused: Not on file    Ms. Radu's family history includes Arthritis in  her sister; Breast cancer in her sister; CVA in her mother; Colon polyps in her sister; Diabetes in her sister; Heart attack in her sister; Hypertension in her sister; Liver cancer in her mother; Prostate cancer in her father; Stroke in her maternal grandfather.      Objective:    Vitals:   02/04/20 1022  BP: 108/60  Pulse: 68  SpO2: 95%    Physical Exam Well-developed, chronically ill-appearing elderly white female in a wheelchair, accompanied by her husband who offers most of the history- in no acute distress.  Height, Weight 195, BMI 34.5  HEENT; nontraumatic normocephalic, EOMI, PE RR LA, sclera anicteric. Oropharynx; not examined Neck; supple, no JVD Cardiovascular; regular rate and rhythm with S1-S2, no murmur rub or gallop Pulmonary; Clear bilaterally Abdomen; soft, nontender, nondistended, no palpable mass or hepatosplenomegaly, bowel sounds are active Rectal; not done today Skin; benign exam, no jaundice rash or appreciable lesions Extremities; no clubbing cyanosis or edema skin warm and  dry Neuro/Psych; alert and oriented x4, right hemiparesis, looks to husband to help answer questions       Assessment & Plan:   #24 74 year old white female on chronic Plavix and aspirin with history of severe iron deficiency anemia, hemoglobin of 6 and heme positive stool with admission August 2021. Work-up at that time pertinent for very large hiatal hernia with mostly intrathoracic stomach, and diffuse gastric erythema and erosions. Colonoscopy with multiple diverticuli otherwise negative.  #2 history of CVA with right hemiparesis 3.  Carotid stenosis 4.  COPD 5.  Adult onset diabetes mellitus 6.  Status post cholecystectomy  Plan; Repeat CBC today and iron studies Continue ferrous sulfate 325 mg daily for now, if iron studies have not improved increase oral iron to twice daily and also consider outpatient iron infusion. Continue Protonix 40 mg p.o. q. a.m. long-term. Endoscopic findings were reviewed with patient and her husband again today. We will need to continue to follow hemoglobin and repeat iron studies again in 3 to 4 months.  Office follow-up with Dr. Silverio Decamp or myself. Plan  Dorothia Passmore Genia Harold PA-C 02/04/2020   Cc: Velna Hatchet, MD

## 2020-02-05 LAB — IRON,TIBC AND FERRITIN PANEL
%SAT: 11 % (calc) — ABNORMAL LOW (ref 16–45)
Ferritin: 19 ng/mL (ref 16–288)
Iron: 38 ug/dL — ABNORMAL LOW (ref 45–160)
TIBC: 339 mcg/dL (calc) (ref 250–450)

## 2020-02-06 ENCOUNTER — Other Ambulatory Visit: Payer: Self-pay

## 2020-02-06 DIAGNOSIS — D509 Iron deficiency anemia, unspecified: Secondary | ICD-10-CM

## 2020-02-24 NOTE — Progress Notes (Signed)
Reviewed and agree with documentation and assessment and plan. K. Veena Najat Olazabal , MD   

## 2020-03-23 DIAGNOSIS — Z87310 Personal history of (healed) osteoporosis fracture: Secondary | ICD-10-CM | POA: Diagnosis not present

## 2020-03-23 DIAGNOSIS — Z87891 Personal history of nicotine dependence: Secondary | ICD-10-CM | POA: Diagnosis not present

## 2020-03-23 DIAGNOSIS — M810 Age-related osteoporosis without current pathological fracture: Secondary | ICD-10-CM | POA: Diagnosis not present

## 2020-03-23 DIAGNOSIS — C50019 Malignant neoplasm of nipple and areola, unspecified female breast: Secondary | ICD-10-CM | POA: Diagnosis not present

## 2020-03-23 DIAGNOSIS — E1169 Type 2 diabetes mellitus with other specified complication: Secondary | ICD-10-CM | POA: Diagnosis not present

## 2020-03-23 DIAGNOSIS — E785 Hyperlipidemia, unspecified: Secondary | ICD-10-CM | POA: Diagnosis not present

## 2020-03-30 DIAGNOSIS — Z1339 Encounter for screening examination for other mental health and behavioral disorders: Secondary | ICD-10-CM | POA: Diagnosis not present

## 2020-03-30 DIAGNOSIS — D509 Iron deficiency anemia, unspecified: Secondary | ICD-10-CM | POA: Diagnosis not present

## 2020-03-30 DIAGNOSIS — E1169 Type 2 diabetes mellitus with other specified complication: Secondary | ICD-10-CM | POA: Diagnosis not present

## 2020-03-30 DIAGNOSIS — Z1331 Encounter for screening for depression: Secondary | ICD-10-CM | POA: Diagnosis not present

## 2020-03-30 DIAGNOSIS — I700 Atherosclerosis of aorta: Secondary | ICD-10-CM | POA: Diagnosis not present

## 2020-03-30 DIAGNOSIS — M810 Age-related osteoporosis without current pathological fracture: Secondary | ICD-10-CM | POA: Diagnosis not present

## 2020-03-30 DIAGNOSIS — K922 Gastrointestinal hemorrhage, unspecified: Secondary | ICD-10-CM | POA: Diagnosis not present

## 2020-03-30 DIAGNOSIS — Z0000 Encounter for general adult medical examination without abnormal findings: Secondary | ICD-10-CM | POA: Diagnosis not present

## 2020-03-30 DIAGNOSIS — F330 Major depressive disorder, recurrent, mild: Secondary | ICD-10-CM | POA: Diagnosis not present

## 2020-03-30 DIAGNOSIS — J449 Chronic obstructive pulmonary disease, unspecified: Secondary | ICD-10-CM | POA: Diagnosis not present

## 2020-03-30 DIAGNOSIS — R82998 Other abnormal findings in urine: Secondary | ICD-10-CM | POA: Diagnosis not present

## 2020-03-30 DIAGNOSIS — E785 Hyperlipidemia, unspecified: Secondary | ICD-10-CM | POA: Diagnosis not present

## 2020-03-30 DIAGNOSIS — Z23 Encounter for immunization: Secondary | ICD-10-CM | POA: Diagnosis not present

## 2020-04-03 ENCOUNTER — Other Ambulatory Visit: Payer: Self-pay | Admitting: Interventional Cardiology

## 2020-04-19 NOTE — Progress Notes (Unsigned)
Cardiology Office Note:    Date:  04/21/2020   ID:  Brandy, Paul 09/09/1945, MRN [id number]  PCP:  Velna Hatchet, MD  Cardiologist:  Sinclair Grooms, MD   Referring MD: Velna Hatchet, MD   Chief Complaint  Patient presents with  . Coronary Artery Disease    Coronary artery spasm    History of Present Illness:    Brandy Paul is a 75 y.o. female with a hx of CVA with right-sided residual weakness, coronary artery spasm, obesity,DM 2, COPD, GERD and normal myocardial perfusion study performed 10/2015.  No recent imaging or functional data.  She is doing well.  Her husband does most of the talking.  She denies neurological symptoms, dyspnea, orthopnea, PND, syncope, but has difficulty with ambulation due to right-sided hemi-paracysts from stroke that occurred in 2013.  Past Medical History:  Diagnosis Date  . Adenomatous colon polyp   . Breast cancer ([place]) 2005   left  . COPD (chronic obstructive pulmonary disease) ([facility])    hx of tobacco use  . Coronary artery spasm ([place])   . Depression   . GERD (gastroesophageal reflux disease)   . Hyperlipemia   . IDA (iron deficiency anemia)    last iron infusion 4 months ago  . Obesity   . Osteoporosis   . 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 Prediabetes    hx of  . Rash    under right breast and groin line using nizoral ointment to q day per dermatology saw few weeks ago  . Stress incontinence wers depends  . Stroke [facility]) 2013   paralytic syndrome of dominant right side  . Toe fracture, left     Past Surgical History:  Procedure Laterality Date  . ABDOMINAL HYSTERECTOMY  1989   complete  . BIOPSY  11/11/2019   Procedure: BIOPSY;  Surgeon: Doran Stabler, MD;  Location: [facility];  Service: Gastroenterology;;  . Lorin Mercy N/A 12/10/2017   Procedure: LAPAROSCOPIC CHOLECYSTECTOMY WITH INTRAOPERATIVE  ERAS PATHWAY;  Surgeon: Johnathan Hausen, MD;  Location: [facility];  Service: General;  Laterality:  N/A;  . COLONOSCOPY WITH PROPOFOL N/A 12/03/2015   Procedure: COLONOSCOPY WITH PROPOFOL;  Surgeon: Mauri Pole, MD;  Location: [facility];  Service: Endoscopy;  Laterality: N/A;  tba before procdcedure  . COLONOSCOPY WITH PROPOFOL N/A 11/11/2019   Procedure: COLONOSCOPY WITH PROPOFOL;  Surgeon: Doran Stabler, MD;  Location: [facility];  Service: Gastroenterology;  Laterality: N/A;  . ENTEROSCOPY N/A 11/11/2019   Procedure: ENTEROSCOPY;  Surgeon: Doran Stabler, MD;  Location: [facility];  Service: Gastroenterology;  Laterality: N/A;  . IR CHOLANGIOGRAM EXISTING TUBE  08/08/2017  . IR CHOLANGIOGRAM EXISTING TUBE  08/27/2017  . IR PERC CHOLECYSTOSTOMY  07/13/2017  . LAPAROSCOPIC APPENDECTOMY N/A 12/10/2017   Procedure: APPENDECTOMY LAPAROSCOPIC;  Surgeon: Johnathan Hausen, MD;  Location: [facility];  Service: General;  Laterality: N/A;  . MASTECTOMY Left 2005   1 lymph node removed  . RADIAL KERATOTOMY Bilateral   . ROBOTIC ASSISTED BILATERAL SALPINGO OOPHERECTOMY Bilateral 11/04/2015   Procedure: XI ROBOTIC ASSISTED BILATERAL SALPINGO OOPHORECTOMY;  Surgeon: Everitt Amber, MD;  Location: [facility];  Service: Gynecology;  Laterality: Bilateral;  . TONSILLECTOMY      Current Medications: Current Meds  Medication Sig  . acetaminophen (TYLENOL) 500 MG tablet Take 500 mg by mouth daily as needed for mild pain.   Marland Kitchen aspirin EC 81 MG tablet Take 81 mg by mouth  daily.  . atorvastatin (LIPITOR) 40 MG tablet Take 40 mg by mouth at bedtime.   Marland Kitchen buPROPion (WELLBUTRIN SR) 150 MG 12 hr tablet Take 150 mg by mouth every evening.   . calcium carbonate (OSCAL) 1500 (600 Ca) MG TABS tablet Take 600 mg of elemental calcium by mouth daily.  . clopidogrel (PLAVIX) 75 MG tablet Take 1 tablet (75 mg total) by mouth at bedtime.  Marland Kitchen ezetimibe (ZETIA) 10 MG tablet Take 10 mg by mouth at bedtime.   . ferrous sulfate 325 (65 FE) MG tablet Take 1 tablet (325 mg total) by mouth daily with breakfast.  . isosorbide  mononitrate (IMDUR) 60 MG 24 hr tablet TAKE 1 TABLET BY MOUTH EVERY DAY  . nitroGLYCERIN (NITROSTAT) 0.4 MG SL tablet Place 1 tablet (0.4 mg total) under the tongue every 5 (five) minutes as needed for chest pain (Call 911 at 3rd dose within 15 minutes).  . pantoprazole (PROTONIX) 40 MG tablet Take 1 tablet (40 mg total) by mouth daily.  . sertraline (ZOLOFT) 100 MG tablet Take 100 mg by mouth at bedtime.   Marland Kitchen tiotropium (SPIRIVA) 18 MCG inhalation capsule Place 18 mcg into inhaler and inhale every evening.     Allergies:   Ceclor [cefaclor]   Social History   Socioeconomic History  . Marital status: Married    Spouse name: [name]  . Number of children: 4  . Years of education: 78  . Highest education level: Not on file  Occupational History  . Occupation: retired  Tobacco Use  . Smoking status: Former Smoker    Types: Cigarettes    Quit date: 02/17/1994    Years since quitting: 26.1  . Smokeless tobacco: Never Used  Vaping Use  . Vaping Use: Never used  Substance and Sexual Activity  . Alcohol use: No    Alcohol/week: 0.0 standard drinks  . Drug use: No  . Sexual activity: Not Currently  Other Topics Concern  . Not on file  Social History Narrative   Lives with spouse   1-2 cups coffee   Social Determinants of Health   Financial Resource Strain: Not on file  Food Insecurity: Not on file  Transportation Needs: Not on file  Physical Activity: Not on file  Stress: Not on file  Social Connections: Not on file     Family History: The patient's family history includes Arthritis in her sister; Breast cancer in her sister; CVA in her mother; Colon polyps in her sister; Diabetes in her sister; Heart attack in her sister; Hypertension in her sister; Liver cancer in her mother; Prostate cancer in her father; Stroke in her maternal grandfather. There is no history of Colon cancer, Esophageal cancer, or Rectal cancer.  ROS:   Please see the history of present illness.    She has  occasional indigestion.  This typically gets better if she takes it carbonated drink and has release of air with a belch.  All other systems reviewed and are negative.  EKGs/Labs/Other Studies Reviewed:    The following studies were reviewed today:  LDL cholesterol 72, hemoglobin A1c 5.7, creatinine 1.96 02/28/2019  EKG:  EKG normal sinus rhythm at 69 bpm.  Nonspecific T wave flattening.  Normal PR interval.  Recent Labs: 11/07/2019: ALT 12 11/11/2019: BUN 12; Creatinine, Ser 0.96; Magnesium 2.2; Potassium 4.0; Sodium 141 02/04/2020: Hemoglobin 11.5; Platelets 141.0  Recent Lipid Panel    Component Value Date/Time   CHOL 139 09/18/2017 0533   TRIG 150 (H) 09/18/2017 0533  HDL 34 (L) 09/18/2017 0533   CHOLHDL 4.1 09/18/2017 0533   VLDL 30 09/18/2017 0533   LDLCALC 75 09/18/2017 0533    Physical Exam:    VS:  BP 122/62   Pulse 69   Ht 5\' 3"  (1.6 m)   Wt 194 lb 9.6 oz (88.3 kg)   SpO2 97%   BMI 34.47 kg/m     Wt Readings from Last 3 Encounters:  04/21/20 194 lb 9.6 oz (88.3 kg)  02/04/20 195 lb (88.5 kg)  11/11/19 192 lb (87.1 kg)     GEN: In a wheelchair.  Appears sad.  Right arm contracture.. No acute distress HEENT: Normal NECK: No JVD. LYMPHATICS: No lymphadenopathy CARDIAC: No murmur. RRR no gallop, or edema. VASCULAR:  Normal Pulses. No bruits. RESPIRATORY:  Clear to auscultation without rales, wheezing or rhonchi  ABDOMEN: Soft, non-tender, non-distended, No pulsatile mass, MUSCULOSKELETAL: Right arm and hand contracture. SKIN: Warm and dry NEUROLOGIC:  Alert and oriented x 3 PSYCHIATRIC:  Normal affect   ASSESSMENT:    1. Coronary artery spasm ([facility])   2. Other hyperlipidemia   3. Hx of TIA (transient ischemic attack) and stroke   4. DOE (dyspnea on exertion)   5. DM (diabetes mellitus), type 2, uncontrolled with complications ([place])   6. Educated about COVID-19 virus infection    PLAN:    In order of problems listed above:  1. Secondary prevention  discussed.  Recent data reveals adequate control of lipids and hemoglobin A1c according to the patient based upon Dr. Ardeth Perfect assessment of blood work from January.  No changes were made.  She has not needed nitroglycerin. 2. Continue Zetia 10 mg/day and atorvastatin 40 mg/day. 3. No new neurological complaints continue aspirin 81 mg/day 4. No evidence of volume overload on exam.  Continue diet avoiding excessive carbohydrate intake 5. Vaccinated and practicing social distancing.  Overall education and awareness concerning secondary risk prevention was discussed in detail: LDL less than 70, hemoglobin A1c less than 7, blood pressure target less than 130/80 mmHg, >150 minutes of moderate aerobic activity per week, avoidance of smoking, weight control (via diet and exercise), and continued surveillance/management of/for obstructive sleep apnea.    Medication Adjustments/Labs and Tests Ordered: Current medicines are reviewed at length with the patient today.  Concerns regarding medicines are outlined above.  Orders Placed This Encounter  Procedures  . EKG 12-Lead   No orders of the defined types were placed in this encounter.   Patient Instructions  Medication Instructions:  Your physician recommends that you continue on your current medications as directed. Please refer to the Current Medication list given to you today.  *If you need a refill on your cardiac medications before your next appointment, please call your pharmacy*   Lab Work: None If you have labs (blood work) drawn today and your tests are completely normal, you will receive your results only by: Marland Kitchen MyChart Message (if you have MyChart) OR . A paper copy in the mail If you have any lab test that is abnormal or we need to change your treatment, we will call you to review the results.   Testing/Procedures: None   Follow-Up: At [facility], you and your health needs are our priority.  As part of our continuing  mission to provide you with exceptional heart care, we have created designated Provider Care Teams.  These Care Teams include your primary Cardiologist (physician) and Advanced Practice Providers (APPs -  Physician Assistants and Nurse Practitioners) who all  work together to provide you with the care you need, when you need it.  We recommend signing up for the patient portal called "MyChart".  Sign up information is provided on this After Visit Summary.  MyChart is used to connect with patients for Virtual Visits (Telemedicine).  Patients are able to view lab/test results, encounter notes, upcoming appointments, etc.  Non-urgent messages can be sent to your provider as well.   To learn more about what you can do with MyChart, go to [email or web address].    Your next appointment:   1 year(s)  The format for your next appointment:   In Person  Provider:   You may see Sinclair Grooms, MD or one of the following Advanced Practice Providers on your designated Care Team:    Kathyrn Drown, NP    Other Instructions      Signed, Sinclair Grooms, MD  04/21/2020 4:24 PM    [place]

## 2020-04-21 ENCOUNTER — Ambulatory Visit (INDEPENDENT_AMBULATORY_CARE_PROVIDER_SITE_OTHER): Payer: Medicare Other | Admitting: Interventional Cardiology

## 2020-04-21 ENCOUNTER — Encounter: Payer: Self-pay | Admitting: Interventional Cardiology

## 2020-04-21 ENCOUNTER — Other Ambulatory Visit: Payer: Self-pay

## 2020-04-21 VITALS — BP 122/62 | HR 69 | Ht 63.0 in | Wt 194.6 lb

## 2020-04-21 DIAGNOSIS — R0609 Other forms of dyspnea: Secondary | ICD-10-CM

## 2020-04-21 DIAGNOSIS — I201 Angina pectoris with documented spasm: Secondary | ICD-10-CM

## 2020-04-21 DIAGNOSIS — Z8673 Personal history of transient ischemic attack (TIA), and cerebral infarction without residual deficits: Secondary | ICD-10-CM

## 2020-04-21 DIAGNOSIS — R0600 Dyspnea, unspecified: Secondary | ICD-10-CM | POA: Diagnosis not present

## 2020-04-21 DIAGNOSIS — E1165 Type 2 diabetes mellitus with hyperglycemia: Secondary | ICD-10-CM

## 2020-04-21 DIAGNOSIS — E7849 Other hyperlipidemia: Secondary | ICD-10-CM

## 2020-04-21 DIAGNOSIS — Z7189 Other specified counseling: Secondary | ICD-10-CM | POA: Diagnosis not present

## 2020-04-21 DIAGNOSIS — E118 Type 2 diabetes mellitus with unspecified complications: Secondary | ICD-10-CM

## 2020-04-21 DIAGNOSIS — IMO0002 Reserved for concepts with insufficient information to code with codable children: Secondary | ICD-10-CM

## 2020-04-21 NOTE — Patient Instructions (Signed)

## 2020-05-19 DIAGNOSIS — M79604 Pain in right leg: Secondary | ICD-10-CM | POA: Diagnosis not present

## 2020-05-19 DIAGNOSIS — M25551 Pain in right hip: Secondary | ICD-10-CM | POA: Diagnosis not present

## 2020-05-24 DIAGNOSIS — R531 Weakness: Secondary | ICD-10-CM | POA: Diagnosis not present

## 2020-05-24 DIAGNOSIS — I69961 Other paralytic syndrome following unspecified cerebrovascular disease affecting right dominant side: Secondary | ICD-10-CM | POA: Diagnosis not present

## 2020-05-24 DIAGNOSIS — M79675 Pain in left toe(s): Secondary | ICD-10-CM | POA: Diagnosis not present

## 2020-05-24 DIAGNOSIS — S92515A Nondisplaced fracture of proximal phalanx of left lesser toe(s), initial encounter for closed fracture: Secondary | ICD-10-CM | POA: Diagnosis not present

## 2020-05-24 DIAGNOSIS — M79651 Pain in right thigh: Secondary | ICD-10-CM | POA: Diagnosis not present

## 2020-05-24 DIAGNOSIS — W010XXA Fall on same level from slipping, tripping and stumbling without subsequent striking against object, initial encounter: Secondary | ICD-10-CM | POA: Diagnosis not present

## 2020-05-24 DIAGNOSIS — M25532 Pain in left wrist: Secondary | ICD-10-CM | POA: Diagnosis not present

## 2020-06-09 DIAGNOSIS — S92505A Nondisplaced unspecified fracture of left lesser toe(s), initial encounter for closed fracture: Secondary | ICD-10-CM | POA: Diagnosis not present

## 2020-06-09 DIAGNOSIS — M25551 Pain in right hip: Secondary | ICD-10-CM | POA: Diagnosis not present

## 2020-06-16 DIAGNOSIS — M7061 Trochanteric bursitis, right hip: Secondary | ICD-10-CM | POA: Diagnosis not present

## 2020-07-06 DIAGNOSIS — M7061 Trochanteric bursitis, right hip: Secondary | ICD-10-CM | POA: Diagnosis not present

## 2020-07-06 DIAGNOSIS — M25551 Pain in right hip: Secondary | ICD-10-CM | POA: Diagnosis not present

## 2020-07-09 DIAGNOSIS — S92505A Nondisplaced unspecified fracture of left lesser toe(s), initial encounter for closed fracture: Secondary | ICD-10-CM | POA: Diagnosis not present

## 2020-09-29 DIAGNOSIS — D649 Anemia, unspecified: Secondary | ICD-10-CM | POA: Diagnosis not present

## 2020-09-29 DIAGNOSIS — R531 Weakness: Secondary | ICD-10-CM | POA: Diagnosis not present

## 2020-09-29 DIAGNOSIS — D509 Iron deficiency anemia, unspecified: Secondary | ICD-10-CM | POA: Diagnosis not present

## 2020-09-29 DIAGNOSIS — E1169 Type 2 diabetes mellitus with other specified complication: Secondary | ICD-10-CM | POA: Diagnosis not present

## 2020-09-30 ENCOUNTER — Encounter (HOSPITAL_COMMUNITY): Payer: Self-pay

## 2020-09-30 ENCOUNTER — Observation Stay (HOSPITAL_COMMUNITY)
Admission: EM | Admit: 2020-09-30 | Discharge: 2020-10-01 | Disposition: A | Discharge status: Home / Self Care | Payer: Medicare Other | Attending: Internal Medicine | Admitting: Internal Medicine

## 2020-09-30 ENCOUNTER — Observation Stay (HOSPITAL_COMMUNITY): Payer: Medicare Other

## 2020-09-30 ENCOUNTER — Other Ambulatory Visit: Payer: Self-pay

## 2020-09-30 DIAGNOSIS — I10 Essential (primary) hypertension: Secondary | ICD-10-CM | POA: Insufficient documentation

## 2020-09-30 DIAGNOSIS — E119 Type 2 diabetes mellitus without complications: Secondary | ICD-10-CM | POA: Diagnosis not present

## 2020-09-30 DIAGNOSIS — D649 Anemia, unspecified: Secondary | ICD-10-CM | POA: Diagnosis present

## 2020-09-30 DIAGNOSIS — D509 Iron deficiency anemia, unspecified: Secondary | ICD-10-CM | POA: Diagnosis not present

## 2020-09-30 DIAGNOSIS — D500 Iron deficiency anemia secondary to blood loss (chronic): Secondary | ICD-10-CM

## 2020-09-30 DIAGNOSIS — R109 Unspecified abdominal pain: Secondary | ICD-10-CM

## 2020-09-30 DIAGNOSIS — Z79899 Other long term (current) drug therapy: Secondary | ICD-10-CM | POA: Insufficient documentation

## 2020-09-30 DIAGNOSIS — E1165 Type 2 diabetes mellitus with hyperglycemia: Secondary | ICD-10-CM

## 2020-09-30 DIAGNOSIS — Z853 Personal history of malignant neoplasm of breast: Secondary | ICD-10-CM | POA: Insufficient documentation

## 2020-09-30 DIAGNOSIS — J449 Chronic obstructive pulmonary disease, unspecified: Secondary | ICD-10-CM | POA: Diagnosis not present

## 2020-09-30 DIAGNOSIS — E785 Hyperlipidemia, unspecified: Secondary | ICD-10-CM | POA: Diagnosis present

## 2020-09-30 DIAGNOSIS — E118 Type 2 diabetes mellitus with unspecified complications: Secondary | ICD-10-CM | POA: Diagnosis not present

## 2020-09-30 DIAGNOSIS — Z87891 Personal history of nicotine dependence: Secondary | ICD-10-CM | POA: Diagnosis not present

## 2020-09-30 DIAGNOSIS — D519 Vitamin B12 deficiency anemia, unspecified: Secondary | ICD-10-CM | POA: Diagnosis not present

## 2020-09-30 DIAGNOSIS — R1030 Lower abdominal pain, unspecified: Secondary | ICD-10-CM | POA: Diagnosis not present

## 2020-09-30 DIAGNOSIS — IMO0002 Reserved for concepts with insufficient information to code with codable children: Secondary | ICD-10-CM | POA: Diagnosis present

## 2020-09-30 DIAGNOSIS — Z8673 Personal history of transient ischemic attack (TIA), and cerebral infarction without residual deficits: Secondary | ICD-10-CM

## 2020-09-30 DIAGNOSIS — Z20822 Contact with and (suspected) exposure to covid-19: Secondary | ICD-10-CM | POA: Insufficient documentation

## 2020-09-30 DIAGNOSIS — Z7982 Long term (current) use of aspirin: Secondary | ICD-10-CM | POA: Insufficient documentation

## 2020-09-30 DIAGNOSIS — F32A Depression, unspecified: Secondary | ICD-10-CM | POA: Diagnosis present

## 2020-09-30 LAB — IRON AND TIBC
Iron: 61 ug/dL (ref 28–170)
Saturation Ratios: 14 % (ref 10.4–31.8)
TIBC: 433 ug/dL (ref 250–450)
UIBC: 372 ug/dL

## 2020-09-30 LAB — COMPREHENSIVE METABOLIC PANEL
ALT: 12 U/L (ref 0–44)
AST: 15 U/L (ref 15–41)
Albumin: 3.4 g/dL — ABNORMAL LOW (ref 3.5–5.0)
Alkaline Phosphatase: 71 U/L (ref 38–126)
Anion gap: 7 (ref 5–15)
BUN: 15 mg/dL (ref 8–23)
CO2: 28 mmol/L (ref 22–32)
Calcium: 8.6 mg/dL — ABNORMAL LOW (ref 8.9–10.3)
Chloride: 105 mmol/L (ref 98–111)
Creatinine, Ser: 0.98 mg/dL (ref 0.44–1.00)
GFR, Estimated: >60 mL/min (ref >60)
Glucose, Bld: 97 mg/dL (ref 70–99)
Potassium: 4.1 mmol/L (ref 3.5–5.1)
Sodium: 140 mmol/L (ref 135–145)
Total Bilirubin: 0.3 mg/dL (ref 0.3–1.2)
Total Protein: 6.3 g/dL — ABNORMAL LOW (ref 6.5–8.1)

## 2020-09-30 LAB — CBC WITH DIFFERENTIAL/PLATELET
Abs Immature Granulocytes: 0.06 10*3/uL (ref 0.00–0.07)
Basophils Absolute: 0 10*3/uL (ref 0.0–0.1)
Basophils Relative: 1 %
Eosinophils Absolute: 0.1 10*3/uL (ref 0.0–0.5)
Eosinophils Relative: 2 %
HCT: 21.7 % — ABNORMAL LOW (ref 36.0–46.0)
Hemoglobin: 5.5 g/dL — CL (ref 12.0–15.0)
Immature Granulocytes: 1 %
Lymphocytes Relative: 25 %
Lymphs Abs: 1.5 10*3/uL (ref 0.7–4.0)
MCH: 16.5 pg — ABNORMAL LOW (ref 26.0–34.0)
MCHC: 25.3 g/dL — ABNORMAL LOW (ref 30.0–36.0)
MCV: 65.2 fL — ABNORMAL LOW (ref 80.0–100.0)
Monocytes Absolute: 0.6 10*3/uL (ref 0.1–1.0)
Monocytes Relative: 10 %
Neutro Abs: 3.7 10*3/uL (ref 1.7–7.7)
Neutrophils Relative %: 61 %
Platelets: 277 10*3/uL (ref 150–400)
RBC: 3.33 MIL/uL — ABNORMAL LOW (ref 3.87–5.11)
RDW: 22.3 % — ABNORMAL HIGH (ref 11.5–15.5)
WBC: 5.9 10*3/uL (ref 4.0–10.5)
nRBC: 0.3 % — ABNORMAL HIGH (ref 0.0–0.2)

## 2020-09-30 LAB — VITAMIN B12: Vitamin B-12: 232 pg/mL (ref 180–914)

## 2020-09-30 LAB — RESP PANEL BY RT-PCR (FLU A&B, COVID) ARPGX2
Influenza A by PCR: NEGATIVE
Influenza B by PCR: NEGATIVE
SARS Coronavirus 2 by RT PCR: NEGATIVE

## 2020-09-30 LAB — POC OCCULT BLOOD, ED: Fecal Occult Bld: NEGATIVE

## 2020-09-30 LAB — FOLATE: Folate: 12.2 ng/mL (ref >5.9)

## 2020-09-30 LAB — PROTIME-INR
INR: 0.9 (ref 0.8–1.2)
Prothrombin Time: 12.3 seconds (ref 11.4–15.2)

## 2020-09-30 LAB — FERRITIN: Ferritin: 3 ng/mL — ABNORMAL LOW (ref 11–307)

## 2020-09-30 LAB — RETICULOCYTES
Immature Retic Fract: 31.4 % — ABNORMAL HIGH (ref 2.3–15.9)
RBC.: 3.09 MIL/uL — ABNORMAL LOW (ref 3.87–5.11)
Retic Count, Absolute: 43.9 10*3/uL (ref 19.0–186.0)
Retic Ct Pct: 1.4 % (ref 0.4–3.1)

## 2020-09-30 LAB — PREPARE RBC (CROSSMATCH)

## 2020-09-30 MED ORDER — SERTRALINE HCL 100 MG PO TABS
100.0000 mg | ORAL_TABLET | Freq: Every day | ORAL | Status: DC
  Administered 2020-09-30: 100 mg via ORAL
  Filled 2020-09-30: qty 1

## 2020-09-30 MED ORDER — ONDANSETRON HCL 4 MG/2ML IJ SOLN: 4.0000 mg | Freq: Four times a day (QID) | INTRAMUSCULAR | Status: DC | PRN

## 2020-09-30 MED ORDER — UMECLIDINIUM BROMIDE 62.5 MCG/INH IN AEPB
1.0000 | INHALATION_SPRAY | Freq: Every day | RESPIRATORY_TRACT | Status: DC
  Administered 2020-10-01: 1 via RESPIRATORY_TRACT
  Filled 2020-09-30: qty 7

## 2020-09-30 MED ORDER — ACETAMINOPHEN 325 MG PO TABS: 650.0000 mg | ORAL_TABLET | Freq: Four times a day (QID) | ORAL | Status: DC | PRN

## 2020-09-30 MED ORDER — ALBUTEROL SULFATE (2.5 MG/3ML) 0.083% IN NEBU: 2.5000 mg | INHALATION_SOLUTION | Freq: Four times a day (QID) | RESPIRATORY_TRACT | Status: DC | PRN

## 2020-09-30 MED ORDER — ACETAMINOPHEN 650 MG RE SUPP: 650.0000 mg | Freq: Four times a day (QID) | RECTAL | Status: DC | PRN

## 2020-09-30 MED ORDER — ONDANSETRON HCL 4 MG PO TABS: 4.0000 mg | ORAL_TABLET | Freq: Four times a day (QID) | ORAL | Status: DC | PRN

## 2020-09-30 MED ORDER — BUPROPION HCL ER (SR) 150 MG PO TB12
150.0000 mg | ORAL_TABLET | Freq: Every day | ORAL | Status: DC
  Administered 2020-10-01: 150 mg via ORAL
  Filled 2020-09-30 (×2): qty 1

## 2020-09-30 MED ORDER — CALCIUM CARBONATE 1250 (500 CA) MG PO TABS
500.0000 mg | ORAL_TABLET | Freq: Every day | ORAL | Status: DC
  Administered 2020-10-01: 500 mg via ORAL
  Filled 2020-09-30: qty 1

## 2020-09-30 MED ORDER — ATORVASTATIN CALCIUM 40 MG PO TABS
40.0000 mg | ORAL_TABLET | Freq: Every day | ORAL | Status: DC
  Administered 2020-09-30: 40 mg via ORAL
  Filled 2020-09-30: qty 1

## 2020-09-30 MED ORDER — EZETIMIBE 10 MG PO TABS
10.0000 mg | ORAL_TABLET | Freq: Every day | ORAL | Status: DC
  Administered 2020-09-30: 10 mg via ORAL
  Filled 2020-09-30: qty 1

## 2020-09-30 MED ORDER — FERROUS SULFATE 325 (65 FE) MG PO TABS
325.0000 mg | ORAL_TABLET | Freq: Every day | ORAL | Status: DC
  Administered 2020-10-01: 325 mg via ORAL
  Filled 2020-09-30: qty 1

## 2020-09-30 MED ORDER — ISOSORBIDE MONONITRATE ER 60 MG PO TB24
60.0000 mg | ORAL_TABLET | Freq: Every day | ORAL | Status: DC
  Administered 2020-10-01: 60 mg via ORAL
  Filled 2020-09-30: qty 1

## 2020-09-30 MED ORDER — POLYETHYLENE GLYCOL 3350 17 G PO PACK: 17.0000 g | PACK | Freq: Every day | ORAL | Status: DC | PRN

## 2020-09-30 MED ORDER — SODIUM CHLORIDE 0.9 % IV SOLN
10.0000 mL/h | Freq: Once | INTRAVENOUS | Status: AC
  Administered 2020-09-30: 10 mL/h via INTRAVENOUS

## 2020-09-30 MED ORDER — PANTOPRAZOLE SODIUM 40 MG PO TBEC
40.0000 mg | DELAYED_RELEASE_TABLET | Freq: Every day | ORAL | Status: DC
  Administered 2020-10-01: 40 mg via ORAL
  Filled 2020-09-30: qty 1

## 2020-09-30 NOTE — H&P (Addendum)
History and Physical    Brandy Paul IPJ:[id number] DOB: 04-13-1945 DOA: 09/30/2020  PCP: Velna Hatchet, MD  Patient coming from: Home  Chief Complaint: PCP sent for blood transfusion  HPI: Brandy Paul is a 75 y.o. female with medical history significant of iron deficiency anemia, COPD, CIWA motion, diabetes mellitus type 2, hypertension, upper GI bleed, hyperlipidemia. Patient went to see her PCP yesterday for a routine visit. CBC was obtained and was significant for a hemoglobin of 5.6. Her PCP requested for her to go to the ED for blood transfusion. Patient is asymptomatic with no lightheadedness, dyspnea, chest pain.  ED Course: Vitals: Temperature of 98.3 F, pulse of 77, respirations of 18, blood pressure 137/62, SPO2 99% on room air Labs: Ferritin of 3, iron of 61, hemoglobin/hematocrit 5.5/21.7, MCV of 65.2, fecal occult blood test was negative Imaging: None Medications/Course: None  Review of Systems: Review of Systems  Constitutional:  Negative for chills and fever.  Respiratory:  Negative for shortness of breath and wheezing.   Cardiovascular:  Negative for chest pain and palpitations.  Gastrointestinal:  Negative for abdominal pain, blood in stool, constipation, diarrhea, melena, nausea and vomiting.  Neurological:  Positive for weakness. Negative for dizziness and focal weakness.  All other systems reviewed and are negative.  Past Medical History:  Diagnosis Date   Adenomatous colon polyp    Breast cancer (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[place])    Prediabetes    hx of   Rash    under right breast and groin line using nizoral ointment to q day per dermatology saw few  weeks ago   Stress incontinence wers depends   Stroke [facility]) 2013   paralytic syndrome of dominant right side   Toe fracture, left     Past Surgical History:  Procedure Laterality Date   ABDOMINAL HYSTERECTOMY  1989   complete   BIOPSY  11/11/2019   Procedure: BIOPSY;  Surgeon: Doran Stabler, MD;  Location: [facility];  Service: Gastroenterology;;   CHOLECYSTECTOMY N/A 12/10/2017   Procedure: LAPAROSCOPIC CHOLECYSTECTOMY WITH INTRAOPERATIVE  ERAS PATHWAY;  Surgeon: Johnathan Hausen, MD;  Location: [facility];  Service: General;  Laterality: N/A;   COLONOSCOPY WITH PROPOFOL N/A 12/03/2015   Procedure: COLONOSCOPY WITH PROPOFOL;  Surgeon: Mauri Pole, MD;  [facility]: [facility];  Service: Endoscopy;  Laterality: N/A;  tba before procdcedure   COLONOSCOPY WITH PROPOFOL N/A 11/11/2019   Procedure: COLONOSCOPY WITH PROPOFOL;  Surgeon: Doran Stabler, MD;  [facility];  Service: Gastroenterology;  Laterality: N/A;   ENTEROSCOPY N/A 11/11/2019   Procedure: ENTEROSCOPY;  Surgeon: Doran Stabler, MD;  [facility];  Service: Gastroenterology;  Laterality: N/A;   IR CHOLANGIOGRAM EXISTING TUBE  08/08/2017   IR CHOLANGIOGRAM EXISTING TUBE  08/27/2017   IR PERC CHOLECYSTOSTOMY  07/13/2017   LAPAROSCOPIC APPENDECTOMY N/A 12/10/2017   Procedure: APPENDECTOMY LAPAROSCOPIC;  Surgeon: Johnathan Hausen, MD;  [facility];  Service: General;  Laterality: N/A;   MASTECTOMY Left 2005   1 lymph node removed   RADIAL KERATOTOMY Bilateral    ROBOTIC  ASSISTED BILATERAL SALPINGO OOPHERECTOMY Bilateral 11/04/2015   Procedure: XI ROBOTIC ASSISTED BILATERAL SALPINGO OOPHORECTOMY;  Surgeon: Everitt Amber, MD;  Location: [facility];  Service: Gynecology;  Laterality: Bilateral;   TONSILLECTOMY       reports that she quit smoking about 26 years ago. Her smoking use included cigarettes. She has never used smokeless tobacco. She reports that she does not drink alcohol and does not use  drugs.  Allergies  Allergen Reactions   Ceclor [Cefaclor] Other (See Comments)    REACTION: "SEVERE HEADACHE"    Family History  Problem Relation Age of Onset   CVA Mother    Liver cancer Mother    Prostate cancer Father    Stroke Maternal Grandfather    Hypertension Sister    Arthritis Sister    Diabetes Sister    Heart attack Sister    Breast cancer Sister    Colon polyps Sister    Colon cancer Neg Hx    Esophageal cancer Neg Hx    Rectal cancer Neg Hx    Prior to Admission medications   Medication Sig Start Date End Date Taking? Authorizing Provider  acetaminophen (TYLENOL) 500 MG tablet Take 500 mg by mouth daily as needed for mild pain.     [provider]  aspirin EC 81 MG tablet Take 81 mg by mouth daily.    [provider]  atorvastatin (LIPITOR) 40 MG tablet Take 40 mg by mouth at bedtime.  06/27/18   [provider]  buPROPion (WELLBUTRIN SR) 150 MG 12 hr tablet Take 150 mg by mouth every evening.  05/18/17   [provider]  calcium carbonate (OSCAL) 1500 (600 Ca) MG TABS tablet Take 600 mg of elemental calcium by mouth daily.    [provider]  clopidogrel (PLAVIX) 75 MG tablet Take 1 tablet (75 mg total) by mouth at bedtime. 11/07/15   Joylene John D, NP  ezetimibe (ZETIA) 10 MG tablet Take 10 mg by mouth at bedtime.     [provider]  ferrous sulfate 325 (65 FE) MG tablet Take 1 tablet (325 mg total) by mouth daily with breakfast. 02/04/20   Esterwood, Amy S, PA-C  isosorbide mononitrate (IMDUR) 60 MG 24 hr tablet TAKE 1 TABLET BY MOUTH EVERY DAY 04/05/20   Belva Crome, MD  nitroGLYCERIN (NITROSTAT) 0.4 MG SL tablet Place 1 tablet (0.4 mg total) under the tongue every 5 (five) minutes as needed for chest pain (Call 911 at 3rd dose within 15 minutes). 11/08/17   Isaiah Serge, NP  pantoprazole (PROTONIX) 40 MG tablet Take 1 tablet (40 mg total) by mouth daily. 02/04/20   Esterwood, Amy S, PA-C  sertraline  (ZOLOFT) 100 MG tablet Take 100 mg by mouth at bedtime.  08/16/17   [provider]  tiotropium (SPIRIVA) 18 MCG inhalation capsule Place 18 mcg into inhaler and inhale every evening.    [provider]    Physical Exam:  Physical Exam Constitutional:      General: She is not in acute distress.    Appearance: She is obese. She is not diaphoretic.  Eyes:     Conjunctiva/sclera: Conjunctivae normal.     Pupils: Pupils are equal, round, and reactive to light.  Cardiovascular:     Rate and Rhythm: Normal rate and regular rhythm.     Heart sounds: Normal heart sounds. No murmur heard. Pulmonary:     Effort: Pulmonary effort is normal. No respiratory distress.  Breath sounds: Normal breath sounds. No wheezing or rales.  Abdominal:     General: Bowel sounds are normal. There is no distension.     Palpations: Abdomen is soft.     Tenderness: There is abdominal tenderness in the left upper quadrant and left lower quadrant. There is no guarding or rebound.  Musculoskeletal:        General: No tenderness. Normal range of motion.     Cervical back: Normal range of motion.     Right lower leg: Edema present.     Left lower leg: Edema present.  Lymphadenopathy:     Cervical: No cervical adenopathy.  Skin:    General: Skin is warm and dry.  Neurological:     Mental Status: She is alert and oriented to person, place, and time.    Labs on Admission: I have personally reviewed following labs and imaging studies  CBC: Recent Labs  Lab 09/30/20 1639  WBC 5.9  NEUTROABS 3.7  HGB 5.5*  HCT 21.7*  MCV 65.2*  PLT 163    Basic Metabolic Panel: Recent Labs  Lab 09/30/20 1639  NA 140  K 4.1  CL 105  CO2 28  GLUCOSE 97  BUN 15  CREATININE 0.98  CALCIUM 8.6*    GFR: CrCl cannot be calculated (Unknown ideal weight.).  Liver Function Tests: Recent Labs  Lab 09/30/20 1639  AST 15  ALT 12  ALKPHOS 71  BILITOT 0.3  PROT 6.3*  ALBUMIN 3.4*   No results  for input(s): LIPASE, AMYLASE in the last 168 hours. No results for input(s): AMMONIA in the last 168 hours.  Coagulation Profile: Recent Labs  Lab 09/30/20 1639  INR 0.9    Urine analysis:    Component Value Date/Time   COLORURINE YELLOW 09/17/2017 [place] 09/17/2017 1256   LABSPEC 1.019 09/17/2017 1256   PHURINE 5.0 09/17/2017 1256   GLUCOSEU NEGATIVE 09/17/2017 1256   HGBUR NEGATIVE 09/17/2017 1256   BILIRUBINUR NEGATIVE 09/17/2017 1256   KETONESUR NEGATIVE 09/17/2017 1256   PROTEINUR NEGATIVE 09/17/2017 1256   NITRITE NEGATIVE 09/17/2017 1256   LEUKOCYTESUR NEGATIVE 09/17/2017 1256     Radiological Exams on Admission: No results found.  Assessment/Plan Principal Problem:   Anemia Active Problems:   DM (diabetes mellitus), type 2, uncontrolled with complications (HCC)   Hyperlipidemia   Iron deficiency anemia   History of stroke   COPD (chronic obstructive pulmonary disease) (HCC)   Depression   Anemia Unknown chronicity but likely very chronic since patient is asymptomatic. In setting of aspirin and Plavix. History of upper GI bleed secondary to erosive esophagitis previous EGD back in August 2021.  Fecal occult negative.  Normal bilirubin.  Ferritin of 3 with normal iron.  At this time, no etiology for GI bleeding other than known iron deficiency anemia.  Reticulocyte count is normal which may be secondary to iron deficiency.  Patient was ordered 2 units of PRBC in the ED. -Complete blood transfusion -Posttransfusion CBC -Repeat fecal occult blood test -Pathologist blood smear review -PT/OT eval  Erosive esophagitis Diagnosed on small bowel endoscopy from August 2021.  At that time recommendation was for oral iron supplementation with intermittent IV iron infusion.  Small bowel endoscopy was performed by Dr. Loletha Carrow.  Patient is on Protonix 40 mg daily as an outpatient. -Continue Protonix 40 mg daily  Abdominal pain Possibly secondary to  some constipation. Mild. Left sided -Abdominal x-ray  History of CVA Patient is on aspirin and Plavix  as an outpatient.  With anemia, will hold aspirin and Plavix for now.  Hyperlipidemia -Continue Zetia 10 mg daily and atorvastatin 40 mg nightly  Primary hypertension History of coronary artery spasm Stress test from 2017 without evidence of ischemia or infarction. -Continue isosorbide mononitrate 60 mg daily  Diabetes mellitus, type 2 Hemoglobin A1C of 5.7% from 10/2019. Not on pharmacologic therapy. Diet control. -Carb modified diet  Depression -Continue Zoloft 100 mg nightly and Wellbutrin SR 150 mg daily  COPD Asymptomatic. -Continue Spiriva (Incruse while inpatient) daily and albuterol as needed   DVT prophylaxis: SCDs Code Status: Full code Family Communication: Husband at bedside Disposition Plan: Discharge home likely in 1-2 days pending stable hemoglobin, PT/OT recommendations Consults called: None Admission status: Observation   Cordelia Poche, MD [facility] 09/30/2020, 6:36 PM

## 2020-09-30 NOTE — Progress Notes (Signed)
Received patient from ED at 2115. Patient is AAOX 3, verbally responsive and capable of verbalizing needs. Noted with right sided weakness;Patient has hx of CVA. No signs of distress or discomfort noted at this time. Patient was oriented to room and call bell placed within reach.

## 2020-09-30 NOTE — ED Provider Notes (Signed)
[facility] DEPT Provider Note   CSN: [id number] Arrival date & time: 09/30/20  1624     History Chief Complaint  Patient presents with   Abnormal Lab    Brandy Paul is a 75 y.o. female.  HPI Patient is a 35 female with history of chronic polyps, internal hemorrhoids, iron deficiency anemia, coronary artery disease, CVA with residual right-sided weakness, COPD who presents to the emergency department after her PCP referred her for evaluation of anemia.  Patient followed up with her PCP yesterday and was found to have a hemoglobin of 5.6.  Reports weakness, shortness of breath, as well as fatigue for the past 3 to 4 days.  No chest pain, abdominal pain, hematochezia, or melena.  She is on aspirin as well as Plavix.  Per records, patient admitted for symptomatic anemia in August 2021.  During this admission she underwent an EGD and colonoscopy with findings of erosive gastritis as well as hiatal hernia.  She was found to have low iron and was started on iron supplementation and received a blood transfusion during this admission.    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 ([place])    Prediabetes    hx of   Rash    under right breast and groin line using nizoral ointment to q day per dermatology saw few weeks ago   Stress incontinence wers depends   Stroke [facility]) 2013   paralytic syndrome of dominant right side   Toe fracture, left     Patient Active Problem List   Diagnosis Date Noted   GIB (gastrointestinal bleeding) 11/08/2019   COPD (chronic obstructive pulmonary disease) ([place])     Depression    GI bleed 11/07/2019   S/P laparoscopic cholecystectomy 12/10/2017   Transient speech disturbance    Stenosis of both middle cerebral arteries    History of stroke    Essential hypertension    TIA (transient ischemic attack) 09/17/2017   Acute cholecystitis 07/12/2017   Appendicitis 05/10/2017   DOE (dyspnea on exertion) 04/22/2017   Anemia 12/02/2015   Ovarian mass, left 11/04/2015   Pelvic mass in female 11/04/2015   Iron deficiency anemia 04/28/2015   Nonspecific abnormal finding in stool contents 04/06/2015   Anemia, iron deficiency 04/06/2015   Encounter for long-term (current) use of antiplatelets/antithrombotics 04/06/2015   Hyperlipidemia 11/24/2014   Hemiparesis affecting right side as late effect of cerebrovascular accident ([place]) 11/24/2014   Coronary artery spasm ([place]) 10/09/2013   CVA, old, aphasia 10/09/2013   DM (diabetes mellitus), type 2, uncontrolled with complications ([place]) 53/97/6734   Gastroesophageal reflux disease without esophagitis 10/09/2013   Centrilobular emphysema ([place]) 10/09/2013    Past Surgical History:  Procedure Laterality Date   ABDOMINAL HYSTERECTOMY  1989   complete   BIOPSY  11/11/2019   Procedure: BIOPSY;  Surgeon: Doran Stabler, MD;  Location: [facility];  Service: Gastroenterology;;   CHOLECYSTECTOMY N/A 12/10/2017   Procedure: LAPAROSCOPIC CHOLECYSTECTOMY WITH INTRAOPERATIVE  ERAS PATHWAY;  Surgeon: Johnathan Hausen, MD;  [facility];  Service: General;  Laterality: N/A;   COLONOSCOPY WITH PROPOFOL N/A 12/03/2015   Procedure: COLONOSCOPY WITH PROPOFOL;  Surgeon: Mauri Pole, MD;  Location: [facility];  Service: Endoscopy;  Laterality: N/A;  tba before procdcedure   COLONOSCOPY WITH PROPOFOL N/A 11/11/2019   Procedure: COLONOSCOPY WITH PROPOFOL;  Surgeon: Doran Stabler, MD;  [facility];  Service: Gastroenterology;  Laterality: N/A;   ENTEROSCOPY N/A 11/11/2019   Procedure: ENTEROSCOPY;   Surgeon: Doran Stabler, MD;  [facility];  Service: Gastroenterology;  Laterality: N/A;   IR CHOLANGIOGRAM EXISTING TUBE  08/08/2017   IR CHOLANGIOGRAM EXISTING TUBE  08/27/2017   IR PERC CHOLECYSTOSTOMY  07/13/2017   LAPAROSCOPIC APPENDECTOMY N/A 12/10/2017   Procedure: APPENDECTOMY LAPAROSCOPIC;  Surgeon: Johnathan Hausen, MD;  [facility];  Service: General;  Laterality: N/A;   MASTECTOMY Left 2005   1 lymph node removed   RADIAL KERATOTOMY Bilateral    ROBOTIC ASSISTED BILATERAL SALPINGO OOPHERECTOMY Bilateral 11/04/2015   Procedure: XI ROBOTIC ASSISTED BILATERAL SALPINGO OOPHORECTOMY;  Surgeon: Everitt Amber, MD;  Location: [facility];  Service: Gynecology;  Laterality: Bilateral;   TONSILLECTOMY       OB History   No obstetric history on file.     Family History  Problem Relation Age of Onset   CVA Mother    Liver cancer Mother    Prostate cancer Father    Stroke Maternal Grandfather    Hypertension Sister    Arthritis Sister    Diabetes Sister    Heart attack Sister    Breast cancer Sister    Colon polyps Sister    Colon cancer Neg Hx    Esophageal cancer Neg Hx    Rectal cancer Neg Hx     Social History   Tobacco Use   Smoking status: Former    Types: Cigarettes    Quit date: 02/17/1994    Years since quitting: 26.6   Smokeless tobacco: Never  Vaping Use   Vaping Use: Never used  Substance Use Topics   Alcohol use: No    Alcohol/week: 0.0 standard drinks   Drug use: No    Home Medications Prior to Admission medications   Medication Sig Start Date End Date Taking? Authorizing Provider  acetaminophen (TYLENOL) 500 MG tablet Take 500 mg by mouth daily as needed for mild pain.     [provider]  aspirin EC 81 MG tablet Take 81 mg by mouth daily.    [provider]  atorvastatin (LIPITOR) 40 MG tablet Take 40 mg by mouth at bedtime.  06/27/18   [provider]  buPROPion (WELLBUTRIN SR) 150 MG 12 hr tablet Take 150 mg by  mouth every evening.  05/18/17   [provider]  calcium carbonate (OSCAL) 1500 (600 Ca) MG TABS tablet Take 600 mg of elemental calcium by mouth daily.    [provider]  clopidogrel (PLAVIX) 75 MG tablet Take 1 tablet (75 mg total) by mouth at bedtime. 11/07/15   Joylene John D, NP  ezetimibe (ZETIA) 10 MG tablet Take 10 mg by mouth at bedtime.     [provider]  ferrous sulfate 325 (65 FE) MG tablet Take 1 tablet (325 mg total) by mouth daily with breakfast. 02/04/20   Esterwood, Amy S, PA-C  isosorbide mononitrate (IMDUR) 60 MG 24 hr tablet TAKE 1 TABLET BY MOUTH EVERY DAY 04/05/20   Belva Crome, MD  nitroGLYCERIN (NITROSTAT) 0.4 MG  SL tablet Place 1 tablet (0.4 mg total) under the tongue every 5 (five) minutes as needed for chest pain (Call 911 at 3rd dose within 15 minutes). 11/08/17   Isaiah Serge, NP  pantoprazole (PROTONIX) 40 MG tablet Take 1 tablet (40 mg total) by mouth daily. 02/04/20   Esterwood, Amy S, PA-C  sertraline (ZOLOFT) 100 MG tablet Take 100 mg by mouth at bedtime.  08/16/17   [provider]  tiotropium (SPIRIVA) 18 MCG inhalation capsule Place 18 mcg into inhaler and inhale every evening.    [provider]    Allergies    Ceclor [cefaclor]  Review of Systems   Review of Systems  All other systems reviewed and are negative. Ten systems reviewed and are negative for acute change, except as noted in the HPI.   Physical Exam Updated Vital Signs BP (!) 143/75   Pulse 78   Temp 98.3 F (36.8 C) (Oral)   Resp 20   SpO2 99%   Physical Exam Vitals and nursing note reviewed.  Constitutional:      General: She is not in acute distress.    Appearance: Normal appearance. She is not ill-appearing, toxic-appearing or diaphoretic.  HENT:     Head: Normocephalic and atraumatic.     Right Ear: External ear normal.     Left Ear: External ear normal.     Nose: Nose normal.     Mouth/Throat:     Mouth: Mucous membranes  are moist.     Pharynx: Oropharynx is clear. No oropharyngeal exudate or posterior oropharyngeal erythema.  Eyes:     General: No scleral icterus.       Right eye: No discharge.        Left eye: No discharge.     Extraocular Movements: Extraocular movements intact.     Conjunctiva/sclera: Conjunctivae normal.  Cardiovascular:     Rate and Rhythm: Normal rate and regular rhythm.     Pulses: Normal pulses.     Heart sounds: Murmur heard.    No friction rub. No gallop.     Comments: Systolic murmur.  Regular rate and rhythm. Pulmonary:     Effort: Pulmonary effort is normal. No respiratory distress.     Breath sounds: Normal breath sounds. No stridor. No wheezing, rhonchi or rales.  Abdominal:     General: Abdomen is flat.     Palpations: Abdomen is soft.     Tenderness: There is no abdominal tenderness.     Comments: Protuberant abdomen that is soft and nontender.  Genitourinary:    Comments: Female nursing chaperone present.  Normal-appearing anal region.  No visible or palpable hemorrhoids noted.  Small amount of brown/yellow stool noted in the rectal vault.  No hematochezia or melena noted.  No tenderness appreciated throughout the exam. Musculoskeletal:        General: Normal range of motion.     Cervical back: Normal range of motion and neck supple. No tenderness.  Skin:    General: Skin is warm and dry.  Neurological:     General: No focal deficit present.     Mental Status: She is alert and oriented to person, place, and time.  Psychiatric:        Mood and Affect: Mood normal.        Behavior: Behavior normal.   ED Results / Procedures / Treatments   Labs (all labs ordered are listed, but only abnormal results are displayed) Labs Reviewed  COMPREHENSIVE METABOLIC PANEL -  Abnormal; Notable for the following components:      Result Value   Calcium 8.6 (*)    Total Protein 6.3 (*)    Albumin 3.4 (*)    All other components within normal limits  CBC WITH  DIFFERENTIAL/PLATELET - Abnormal; Notable for the following components:   RBC 3.33 (*)    Hemoglobin 5.5 (*)    HCT 21.7 (*)    MCV 65.2 (*)    MCH 16.5 (*)    MCHC 25.3 (*)    RDW 22.3 (*)    nRBC 0.3 (*)    All other components within normal limits  RESP PANEL BY RT-PCR (FLU A&B, COVID) ARPGX2  PROTIME-INR  VITAMIN B12  FOLATE  IRON AND TIBC  FERRITIN  RETICULOCYTES  POC OCCULT BLOOD, ED  TYPE AND SCREEN  PREPARE RBC (CROSSMATCH)    EKG None  Radiology No results found.  Procedures .Critical Care  Date/Time: 09/30/2020 6:26 PM Performed by: Rayna Sexton, PA-C Authorized by: Rayna Sexton, PA-C   Critical care provider statement:    Critical care time (minutes):  30   Critical care was necessary to treat or prevent imminent or life-threatening deterioration of the following conditions: Symptomatic anemia requiring blood transfusion.   Critical care was time spent personally by me on the following activities:  Discussions with consultants, evaluation of patient's response to treatment, examination of patient, ordering and performing treatments and interventions, ordering and review of laboratory studies, ordering and review of radiographic studies, pulse oximetry, re-evaluation of patient's condition, obtaining history from patient or surrogate and review of old charts   Medications Ordered in ED Medications  0.9 %  sodium chloride infusion (has no administration in time range)    ED Course  I have reviewed the triage vital signs and the nursing notes.  Pertinent labs & imaging results that were available during my care of the patient were reviewed by me and considered in my medical decision making (see chart for details).    MDM Rules/Calculators/A&P                          Pt is a 75 y.o. female who presents to the emergency department due to low hemoglobin.  Labs: CBC with a hemoglobin of 5.5, MCV of 65.2, RDW of 22.3. CMP with a calcium of 8.6,  total protein of 6.3, albumin of 3.4. PT/INR within normal limits. Fecal occult blood is negative. Anemia panel is pending. Respiratory panel is pending.  I, Rayna Sexton, PA-C, personally reviewed and evaluated these images and lab results as part of my medical decision-making.  Patient admitted last year with symptomatic anemia and required blood transfusion.  She had an EGD as well as a colonoscopy during this admission.  Please see HPI above for additional details.  Appears to once again be anemic with a hemoglobin of 5.5.  Her husband is at bedside and believes that she has not been taking her ferrous sulfate recently.  Hemoccult is negative.  No abdominal pain on my exam.  2 units of PRBCs have been ordered.  Anemia panel is pending.  Patient will require admission for further work-up and trending her H&H.  This was discussed with the patient and she is amenable.  We will discussed with the medicine team.  Note: Portions of this report may have been transcribed using voice recognition software. Every effort was made to ensure accuracy; however, inadvertent computerized transcription errors may be present.  Final Clinical Impression(s) / ED Diagnoses Final diagnoses:  Symptomatic anemia   Rx / DC Orders ED Discharge Orders     None        Rayna Sexton, PA-C 09/30/20 1830    Arnaldo Natal, MD 09/30/20 1921

## 2020-09-30 NOTE — ED Triage Notes (Addendum)
Pt here sent by PCP for low hemoglobin of 5.6. Pt and pt husband have print out of lab work completed at PCP. Blood test done yesterday. Pt c/o feeling weak. Pt denies bloody stools. Pt husband states pt has not been taking her iron pills.

## 2020-10-01 ENCOUNTER — Encounter (HOSPITAL_COMMUNITY): Payer: Self-pay | Admitting: Family Medicine

## 2020-10-01 DIAGNOSIS — D500 Iron deficiency anemia secondary to blood loss (chronic): Secondary | ICD-10-CM | POA: Diagnosis not present

## 2020-10-01 DIAGNOSIS — D509 Iron deficiency anemia, unspecified: Secondary | ICD-10-CM | POA: Diagnosis not present

## 2020-10-01 LAB — COMPREHENSIVE METABOLIC PANEL
ALT: 10 U/L (ref 0–44)
AST: 13 U/L — ABNORMAL LOW (ref 15–41)
Albumin: 3 g/dL — ABNORMAL LOW (ref 3.5–5.0)
Alkaline Phosphatase: 64 U/L (ref 38–126)
Anion gap: 8 (ref 5–15)
BUN: 13 mg/dL (ref 8–23)
CO2: 27 mmol/L (ref 22–32)
Calcium: 8.4 mg/dL — ABNORMAL LOW (ref 8.9–10.3)
Chloride: 105 mmol/L (ref 98–111)
Creatinine, Ser: 0.9 mg/dL (ref 0.44–1.00)
GFR, Estimated: >60 mL/min (ref >60)
Glucose, Bld: 85 mg/dL (ref 70–99)
Potassium: 4.1 mmol/L (ref 3.5–5.1)
Sodium: 140 mmol/L (ref 135–145)
Total Bilirubin: 0.7 mg/dL (ref 0.3–1.2)
Total Protein: 5.6 g/dL — ABNORMAL LOW (ref 6.5–8.1)

## 2020-10-01 LAB — CBC
HCT: 27.9 % — ABNORMAL LOW (ref 36.0–46.0)
Hemoglobin: 7.7 g/dL — ABNORMAL LOW (ref 12.0–15.0)
MCH: 19.6 pg — ABNORMAL LOW (ref 26.0–34.0)
MCHC: 27.6 g/dL — ABNORMAL LOW (ref 30.0–36.0)
MCV: 71.2 fL — ABNORMAL LOW (ref 80.0–100.0)
Platelets: 231 10*3/uL (ref 150–400)
RBC: 3.92 MIL/uL (ref 3.87–5.11)
RDW: 24 % — ABNORMAL HIGH (ref 11.5–15.5)
WBC: 5.3 10*3/uL (ref 4.0–10.5)
nRBC: 0.6 % — ABNORMAL HIGH (ref 0.0–0.2)

## 2020-10-01 LAB — PATHOLOGIST SMEAR REVIEW

## 2020-10-01 MED ORDER — VITAMIN B-12 1000 MCG PO TABS: 1000.0000 ug | ORAL_TABLET | Freq: Every day | ORAL | Status: DC

## 2020-10-01 MED ORDER — CYANOCOBALAMIN 1000 MCG/ML IJ SOLN
1000.0000 ug | Freq: Once | INTRAMUSCULAR | Status: DC
  Filled 2020-10-01: qty 1

## 2020-10-01 MED ORDER — CYANOCOBALAMIN 1000 MCG PO TABS: 1000.0000 ug | ORAL_TABLET | Freq: Every day | ORAL | Status: AC

## 2020-10-01 NOTE — Evaluation (Signed)
Occupational Therapy Evaluation Patient Details Name: Brandy Paul MRN: [id number] DOB: [birth year] Today's Date: 10/01/2020    History of Present Illness Patient admitted with anemia and received PRBC. Brandy Paul is a 75 y.o. female with medical history significant of iron deficiency anemia, COPD, CIWA motion, diabetes mellitus type 2, hypertension, upper GI bleed, hyperlipidemia.   Clinical Impression   [name] is a 75 year old who presents with chronic right sided hemiplegia, obesity and impaired balance. On evaluation she required mod assistance for bed mobility, min assist for standing and transferring to recliner and assistance for ADLs. Patient requires assistance for ADLs at home. She reports she uses a quad cane and has assistance of husband 24/7. Patient has no complaints of weakness or new deficits. Patient is at her baseline. No OT needs at this time.     Follow Up Recommendations  No OT follow up    Equipment Recommendations  None recommended by OT    Recommendations for Other Services       Precautions / Restrictions Precautions Precautions: Fall Precaution Comments: R sided hemiplegia Restrictions Weight Bearing Restrictions: No      Mobility Bed Mobility Overal bed mobility: Needs Assistance Bed Mobility: Supine to Sit     Supine to sit: Mod assist;HOB elevated     General bed mobility comments: Mod assist for trunk negotiation and pivoting hip to edge of bed - patient able to assit with LE    Transfers Overall transfer level: Needs assistance Equipment used: 1 person hand held assist Transfers: Sit to/from [facility] Sit to Stand: Min assist Stand pivot transfers: Min assist       General transfer comment: Min assist to stand and pivot with one hand hold. Patient typically uses quad cane.    Balance Overall balance assessment: Needs assistance Sitting-balance support: No upper extremity supported Sitting  balance-Leahy Scale: Fair     Standing balance support: During functional activity Standing balance-Leahy Scale: Poor                             ADL either performed or assessed with clinical judgement   ADL Overall ADL's : Needs assistance/impaired Eating/Feeding: Set up;Sitting   Grooming: Set up;Sitting;Wash/dry face   Upper Body Bathing: Set up;Sitting   Lower Body Bathing: Moderate assistance;Sit to/from stand   Upper Body Dressing : Moderate assistance;Sitting   Lower Body Dressing: Maximal assistance;Sit to/from stand   Toilet Transfer: Minimal assistance;BSC;Stand-pivot   Toileting- Water quality scientist and Hygiene: Sit to/from stand;Moderate assistance               Vision Patient Visual Report: No change from baseline       Perception     Praxis      Pertinent Vitals/Pain Pain Assessment: No/denies pain     Hand Dominance Left   Extremity/Trunk Assessment Upper Extremity Assessment Upper Extremity Assessment: RUE deficits/detail;LUE deficits/detail RUE Deficits / Details: Shoulder contracted into adduction and internally rotated, elbow flexion, forarm supinated and fingers contracted into palm; no functional ROM or use. RUE Sensation: WNL RUE Coordination: decreased gross motor;decreased fine motor LUE Deficits / Details: WFL ROm and strength LUE Sensation: WNL LUE Coordination: WNL   Lower Extremity Assessment Lower Extremity Assessment: Defer to PT evaluation   Cervical / Trunk Assessment Cervical / Trunk Assessment: Normal   Communication Communication Communication: Expressive difficulties   Cognition Arousal/Alertness: Awake/alert Behavior During Therapy: WFL for tasks assessed/performed Overall  Cognitive Status: Within Functional Limits for tasks assessed                                     General Comments       Exercises     Shoulder Instructions      Home Living Family/patient expects to be  discharged to:: Private residence Living Arrangements: Spouse/significant other Available Help at Discharge: Family;Available 24 hours/day Type of Home: House Home Access: Level entry     Home Layout: One level     Bathroom Shower/Tub: Occupational psychologist: Standard     Home Equipment: Cane - quad;Bedside commode;Wheelchair - manual          Prior Functioning/Environment Level of Independence: Needs assistance  Gait / Transfers Assistance Needed: uses a quad cane ADL's / Homemaking Assistance Needed: assistance from husband with ADLs. Reports she goes to the bathroom on her own. Communication / Swallowing Assistance Needed: increased time due to expressive aphasia, short responses          OT Problem List: Decreased strength;Decreased range of motion;Impaired balance (sitting and/or standing);Impaired UE functional use;Obesity;Impaired tone      OT Treatment/Interventions:      OT Goals(Current goals can be found in the care plan section) Acute Rehab OT Goals OT Goal Formulation: All assessment and education complete, DC therapy  OT Frequency:     Barriers to D/C:            Co-evaluation              AM-PAC OT "6 Clicks" Daily Activity     Outcome Measure Help from another person eating meals?: A Little Help from another person taking care of personal grooming?: A Little Help from another person toileting, which includes using toliet, bedpan, or urinal?: A Lot Help from another person bathing (including washing, rinsing, drying)?: A Lot Help from another person to put on and taking off regular upper body clothing?: A Lot Help from another person to put on and taking off regular lower body clothing?: A Lot 6 Click Score: 14   End of Session Equipment Utilized During Treatment: Gait belt Nurse Communication: Mobility status  Activity Tolerance: Patient tolerated treatment well Patient left: in chair;with call bell/phone within reach;with  chair alarm set  OT Visit Diagnosis: Muscle weakness (generalized) (M62.81)                Time: 0981-1914 OT Time Calculation (min): 20 min Charges:  OT General Charges $OT Visit: 1 Visit OT Evaluation $OT Eval Moderate Complexity: 1 Mod  Brandy Paul, OTR/L [place]  Office [phone] Pager: [phone]   Brandy Paul 10/01/2020, 10:17 AM

## 2020-10-01 NOTE — Evaluation (Signed)
Physical Therapy Evaluation Patient Details Name: Brandy Paul MRN: [id number] DOB: 1946/02/18 Today's Date: 10/01/2020   History of Present Illness  Patient admitted with anemia and received PRBC. Brandy Paul is a 75 y.o. female with medical history significant of iron deficiency anemia, COPD, CIWA motion, diabetes mellitus type 2, hypertension, upper GI bleed, hyperlipidemia.    Clinical Impression  Brandy Paul is 75 y.o. female admitted with above HPI and diagnosis. Patient is currently limited by functional impairments below (see PT problem list). Patient lives with her husband and requires assist for ADLs and to mobilize with quad cane at baseline. Patient will benefit from continued skilled PT interventions to address impairments and progress independence with mobility, recommending HHPT follow up with transition to OPPT for balance training and strengthening as pt has had increased falls in last 6 months. Acute PT will follow and progress as able.     Follow Up Recommendations Home health PT    Equipment Recommendations  None recommended by PT    Recommendations for Other Services       Precautions / Restrictions Precautions Precautions: Fall Precaution Comments: R sided hemiplegia Restrictions Weight Bearing Restrictions: No      Mobility  Bed Mobility Overal bed mobility: Needs Assistance Bed Mobility: Supine to Sit     Supine to sit: HOB elevated;Min guard;Min assist     General bed mobility comments: Mod assist for trunk negotiation and pivoting hip to edge of bed - patient able to assit with LE    Transfers Overall transfer level: Needs assistance Equipment used: 1 person hand held assist;Quad cane Transfers: Sit to/from Stand Sit to Stand: Min assist Stand pivot transfers: Min assist       General transfer comment: pt able to scoot forward to EOB, using Lt hand for power up and min assist to steady with transition to bring hand to quad cane.  cues for safe reach back to sit in recliner.  Ambulation/Gait Ambulation/Gait assistance: Min assist;Mod assist Gait Distance (Feet): 90 Feet (2x45) Assistive device: Quad cane;1 person hand held assist Gait Pattern/deviations: Step-through pattern;Decreased stride length;Decreased weight shift to right;Wide base of support Gait velocity: decr   General Gait Details: pt sequencing gait with safe use of quad cane to advance with Rt LE. 2 episodes of slight LOB requiring min assist to steady. Close chair follow with min guard/assist throughout for safety. pt took seated rest between 2 bouts of gait due to fatigue.  Stairs            Wheelchair Mobility    Modified Rankin (Stroke Patients Only)       Balance Overall balance assessment: Needs assistance Sitting-balance support: No upper extremity supported Sitting balance-Leahy Scale: Fair     Standing balance support: During functional activity Standing balance-Leahy Scale: Poor                               Pertinent Vitals/Pain Pain Assessment: No/denies pain    Home Living Family/patient expects to be discharged to:: Private residence Living Arrangements: Spouse/significant other Available Help at Discharge: Family;Available 24 hours/day Type of Home: House Home Access: Level entry     Home Layout: One level Home Equipment: Cane - quad;Bedside commode;Wheelchair - manual      Prior Function Level of Independence: Needs assistance   Gait / Transfers Assistance Needed: uses a quad cane  ADL's / Homemaking Assistance Needed: assistance from husband with ADLs.  Reports she goes to the bathroom on her own.        Hand Dominance   Dominant Hand: Left    Extremity/Trunk Assessment   Upper Extremity Assessment Upper Extremity Assessment: Defer to OT evaluation RUE Deficits / Details: Shoulder contracted into adduction and internally rotated, elbow flexion, forarm supinated and fingers contracted  into palm; no functional ROM or use. RUE Sensation: WNL RUE Coordination: decreased gross motor;decreased fine motor LUE Deficits / Details: WFL ROm and strength LUE Sensation: WNL LUE Coordination: WNL    Lower Extremity Assessment Lower Extremity Assessment: RLE deficits/detail;LLE deficits/detail RLE Deficits / Details: pt with extensor tone in LE and foot drop on Rt with reduce dorsiflexor strength RLE Sensation: WNL RLE Coordination: decreased gross motor;decreased fine motor LLE Deficits / Details: WFL for functional strength LLE Sensation: WNL LLE Coordination: WNL    Cervical / Trunk Assessment Cervical / Trunk Assessment: Normal  Communication   Communication: Expressive difficulties  Cognition Arousal/Alertness: Awake/alert Behavior During Therapy: WFL for tasks assessed/performed Overall Cognitive Status: Within Functional Limits for tasks assessed                                        General Comments      Exercises     Assessment/Plan    PT Assessment Patient needs continued PT services  PT Problem List Decreased strength;Decreased range of motion;Decreased balance;Decreased activity tolerance;Decreased mobility;Decreased coordination;Decreased knowledge of use of DME;Decreased safety awareness;Decreased knowledge of precautions       PT Treatment Interventions DME instruction;Gait training;Functional mobility training;Balance training;Therapeutic activities;Therapeutic exercise;Neuromuscular re-education;Patient/family education    PT Goals (Current goals can be found in the Care Plan section)  Acute Rehab PT Goals Patient Stated Goal: improve balance and mobility PT Goal Formulation: With patient/family Time For Goal Achievement: 10/15/20 Potential to Achieve Goals: Good    Frequency Min 3X/week   Barriers to discharge        Co-evaluation               AM-PAC PT "6 Clicks" Mobility  Outcome Measure Help needed turning  from your back to your side while in a flat bed without using bedrails?: A Little Help needed moving from lying on your back to sitting on the side of a flat bed without using bedrails?: A Little Help needed moving to and from a bed to a chair (including a wheelchair)?: A Little Help needed standing up from a chair using your arms (e.g., wheelchair or bedside chair)?: A Little Help needed to walk in hospital room?: A Little Help needed climbing 3-5 steps with a railing? : A Lot 6 Click Score: 17    End of Session Equipment Utilized During Treatment: Gait belt Activity Tolerance: Patient tolerated treatment well Patient left: in chair;with call bell/phone within reach;with chair alarm set;with family/visitor present Nurse Communication: Mobility status PT Visit Diagnosis: Muscle weakness (generalized) (M62.81);Difficulty in walking, not elsewhere classified (R26.2);Unsteadiness on feet (R26.81);Other abnormalities of gait and mobility (R26.89)    Time: 5093-2671 PT Time Calculation (min) (ACUTE ONLY): 28 min   Charges:   PT Evaluation $PT Eval Low Complexity: 1 Low PT Treatments $Gait Training: 8-22 mins        Verner Mould, DPT Acute Rehabilitation Services Office [phone] Pager [phone]   Jacques Navy 10/01/2020, 1:41 PM

## 2020-10-01 NOTE — Discharge Summary (Signed)
DISCHARGE SUMMARY  Brandy Paul  MR#: [id number]  DOB:1945/07/05  Date of Admission: 09/30/2020 Date of Discharge: 10/01/2020  Attending Physician:Michele Judy Hennie Duos, MD  Patient's CVE:[id number], Brandy Reaper, MD  Consults: none   Disposition: D/C home   Follow-up Appts:  Follow-up Information     Velna Hatchet, MD Follow up in 3 day(s).   Specialty: Internal Medicine Contact information: [place] [address] [phone]                 Tests Needing Follow-up: -CBC suggested in 3-5 days  -follow-up B12 level in 8-12 weeks -consider when it is safe to resume ASA & Plavix   Discharge Diagnoses: Microcytic anemia of unclear etiology B12 deficiency Erosive esophagitis Mild left-sided crampy abdominal pain History of CVA -chronic right-sided hemiplegia HLD HTN  DM2 Depression COPD Obesity - Body mass index is 33.94 kg/m.  Initial presentation: 75yo with a history of chronic iron deficiency anemia, COPD, DM2, HTN, UGIB, and HLD who visited her PCP for routine visit 7/13.  A CBC was obtained and 7/14 revealed a hemoglobin of 5.6.  She was therefore contacted by her PCP and reported to present to the ER.  Patient herself was actually entirely asymptomatic denying lightheadedness dyspnea or chest pain.  Hospital Course:  Microcytic anemia of unclear etiology Clinical picture consistent with a very gradual blood loss / chronic anemia - with prior UGIB and with patient on aspirin and Plavix suspect this is a very slow GI leak - erosive esophagitis noted on EGD August 2021 - was guaiac negative in ER with normal bilirubin - ferritin consistent with severe iron deficiency at 3 - has been transfused 2 units PRBC since admission with favorable response - her iron level is actually within low normal range at 61 this admission so will hold on IV Fe for now - able to tolerate regular diet, and ambulating in hall w/ assist as per her baseline, therefore will  d/c home for ongoing outpt w/u of this chronic issue    B12 deficiency B12 is below target range at 232 -supplemented with subcu dosing x1 then initiate oral replacement -will need outpatient follow-up in 8-12 weeks   Erosive esophagitis Noted via EGD August 2021 - recommendation at that time was for ongoing oral and intermittent IV iron supplementation and Protonix 40 mg daily   Mild left-sided crampy abdominal pain Resolved - KUB w/o acute findings   History of CVA -chronic right-sided hemiplegia For which patient is supposed to be on aspirin and Plavix -both on hold for now due to above   HLD Continue usual home medical therapies   HTN   DM2 Does not require medical therapy/diet controlled   Depression Continue usual home medical therapy   COPD Well compensated at present   Obesity - Body mass index is 33.94 kg/m. Allergies as of 10/01/2020       Reactions   Ceclor [cefaclor] Other (See Comments)   REACTION: "SEVERE HEADACHE"        Medication List     STOP taking these medications    aspirin EC 81 MG tablet   clopidogrel 75 MG tablet Commonly known as: PLAVIX       TAKE these medications    acetaminophen 500 MG tablet Commonly known as: TYLENOL Take 500 mg by mouth daily as needed for mild pain.   albuterol 108 (90 Base) MCG/ACT inhaler Commonly known as: VENTOLIN HFA Inhale 1 puff into the lungs every 6 (six) hours as  needed for wheezing or shortness of breath.   atorvastatin 40 MG tablet Commonly known as: LIPITOR Take 40 mg by mouth at bedtime.   buPROPion 150 MG 12 hr tablet Commonly known as: WELLBUTRIN SR Take 150 mg by mouth daily.   calcium carbonate 1500 (600 Ca) MG Tabs tablet Commonly known as: OSCAL Take 600 mg of elemental calcium by mouth daily.   cyanocobalamin 1000 MCG tablet Take 1 tablet (1,000 mcg total) by mouth daily. Start taking on: October 02, 2020   ezetimibe 10 MG tablet Commonly known as: ZETIA Take 10 mg by  mouth at bedtime.   ferrous sulfate 325 (65 FE) MG tablet Take 1 tablet (325 mg total) by mouth daily with breakfast.   isosorbide mononitrate 60 MG 24 hr tablet Commonly known as: IMDUR TAKE 1 TABLET BY MOUTH EVERY DAY   nitroGLYCERIN 0.4 MG SL tablet Commonly known as: NITROSTAT Place 1 tablet (0.4 mg total) under the tongue every 5 (five) minutes as needed for chest pain (Call 911 at 3rd dose within 15 minutes).   pantoprazole 40 MG tablet Commonly known as: PROTONIX Take 1 tablet (40 mg total) by mouth daily.   sertraline 100 MG tablet Commonly known as: ZOLOFT Take 100 mg by mouth at bedtime.   tiotropium 18 MCG inhalation capsule Commonly known as: SPIRIVA Place 18 mcg into inhaler and inhale every evening.        Day of Discharge BP 112/60 (BP Location: Left Arm)   Pulse 84   Temp 98.4 F (36.9 C) (Oral)   Resp 17   Ht 5\' 3"  (1.6 m)   Wt 86.9 kg   SpO2 97%   BMI 33.94 kg/m   Physical Exam: General: No acute respiratory distress Lungs: Clear to auscultation bilaterally without wheezes or crackles Cardiovascular: Regular rate and rhythm without murmur gallop or rub normal S1 and S2 Abdomen: Nontender, nondistended, soft, bowel sounds positive, no rebound, no ascites, no appreciable mass Extremities: No significant cyanosis, clubbing, or edema bilateral lower extremities  Basic Metabolic Panel: Recent Labs  Lab 09/30/20 1639 10/01/20 0432  NA 140 140  K 4.1 4.1  CL 105 105  CO2 28 27  GLUCOSE 97 85  BUN 15 13  CREATININE 0.98 0.90  CALCIUM 8.6* 8.4*    Liver Function Tests: Recent Labs  Lab 09/30/20 1639 10/01/20 0432  AST 15 13*  ALT 12 10  ALKPHOS 71 64  BILITOT 0.3 0.7  PROT 6.3* 5.6*  ALBUMIN 3.4* 3.0*     Coags: Recent Labs  Lab 09/30/20 1639  INR 0.9    CBC: Recent Labs  Lab 09/30/20 1639 10/01/20 0432  WBC 5.9 5.3  NEUTROABS 3.7  --   HGB 5.5* 7.7*  HCT 21.7* 27.9*  MCV 65.2* 71.2*  PLT 277 231     Recent  Results (from the past 240 hour(s))  Resp Panel by RT-PCR (Flu A&B, Covid) Nasopharyngeal Swab     Status: None   Collection Time: 09/30/20  6:01 PM   Specimen: Nasopharyngeal Swab; Nasopharyngeal(NP) swabs in vial transport medium  Result Value Ref Range Status   SARS Coronavirus 2 by RT PCR NEGATIVE NEGATIVE Final    Comment: (NOTE) SARS-CoV-2 target nucleic acids are NOT DETECTED.  The SARS-CoV-2 RNA is generally detectable in upper respiratory specimens during the acute phase of infection. The lowest concentration of SARS-CoV-2 viral copies this assay can detect is 138 copies/mL. A negative result does not preclude SARS-Cov-2 infection and should not be  used as the sole basis for treatment or other patient management decisions. A negative result may occur with  improper specimen collection/handling, submission of specimen other than nasopharyngeal swab, presence of viral mutation(s) within the areas targeted by this assay, and inadequate number of viral copies(<138 copies/mL). A negative result must be combined with clinical observations, patient history, and epidemiological information. The expected result is Negative.  Fact Sheet for Patients:  EntrepreneurPulse.com.au  Fact Sheet for Healthcare Providers:  IncredibleEmployment.be  This test is no t yet approved or cleared by the [place] FDA and  has been authorized for detection and/or diagnosis of SARS-CoV-2 by FDA under an Emergency Use Authorization (EUA). This EUA will remain  in effect (meaning this test can be used) for the duration of the COVID-19 declaration under Section 564(b)(1) of the Act, 21 U.S.C.section 360bbb-3(b)(1), unless the authorization is terminated  or revoked sooner.       Influenza A by PCR NEGATIVE NEGATIVE Final   Influenza B by PCR NEGATIVE NEGATIVE Final    Comment: (NOTE) The Xpert Xpress SARS-CoV-2/FLU/RSV plus assay is intended as an aid in the  diagnosis of influenza from Nasopharyngeal swab specimens and should not be used as a sole basis for treatment. Nasal washings and aspirates are unacceptable for Xpert Xpress SARS-CoV-2/FLU/RSV testing.  Fact Sheet for Patients: EntrepreneurPulse.com.au  Fact Sheet for Healthcare Providers: IncredibleEmployment.be  This test is not yet approved or cleared by the [place] FDA and has been authorized for detection and/or diagnosis of SARS-CoV-2 by FDA under an Emergency Use Authorization (EUA). This EUA will remain in effect (meaning this test can be used) for the duration of the COVID-19 declaration under Section 564(b)(1) of the Act, 21 U.S.C. section 360bbb-3(b)(1), unless the authorization is terminated or revoked.  Performed at [facility] [place] [address] [place], [place] [address]       Time spent in discharge (includes decision making & examination of pt): 30 minutes  10/01/2020, 12:38 PM   Cherene Altes, MD [facility] Office  [phone]

## 2020-10-01 NOTE — Progress Notes (Signed)
Pt being discharged to home with husband. Discharge instructions and medication education provided to pt.  

## 2020-10-01 NOTE — TOC Transition Note (Signed)
Transition of Care [facility]) - CM/SW Discharge Note   Patient Details  Name: Brandy Paul MRN: [id number] Date of Birth: 14-Jul-1945  Transition of Care [facility]) CM/SW Contact:  Trish Mage, LCSW Phone Number: 10/01/2020, 2:14 PM   Clinical Narrative:   Patient seen in follow up to consult to talk to husband about in home services, PT recommendation of [facility].  Confirmed what husband already knew, which is that his insurance will not pay for care giver in home.  Patient is open to [facility] services, no agency preference. Orders seen and appreciated.  Cindie with [facility] agrees to provide [facility] services.  No further needs identified. TOC sign off.    Final next level of care: [place] Barriers to Discharge: No Barriers Identified   Patient Goals and CMS Choice        Discharge Placement                       Discharge Plan and Services                                     Social Determinants of Health (SDOH) Interventions     Readmission Risk Interventions No flowsheet data found.

## 2020-10-02 DIAGNOSIS — M810 Age-related osteoporosis without current pathological fracture: Secondary | ICD-10-CM | POA: Diagnosis not present

## 2020-10-02 DIAGNOSIS — Z6834 Body mass index (BMI) 34.0-34.9, adult: Secondary | ICD-10-CM | POA: Diagnosis not present

## 2020-10-02 DIAGNOSIS — D126 Benign neoplasm of colon, unspecified: Secondary | ICD-10-CM | POA: Diagnosis not present

## 2020-10-02 DIAGNOSIS — I201 Angina pectoris with documented spasm: Secondary | ICD-10-CM | POA: Diagnosis not present

## 2020-10-02 DIAGNOSIS — E538 Deficiency of other specified B group vitamins: Secondary | ICD-10-CM | POA: Diagnosis not present

## 2020-10-02 DIAGNOSIS — Z79899 Other long term (current) drug therapy: Secondary | ICD-10-CM | POA: Diagnosis not present

## 2020-10-02 DIAGNOSIS — F32A Depression, unspecified: Secondary | ICD-10-CM | POA: Diagnosis not present

## 2020-10-02 DIAGNOSIS — Z9012 Acquired absence of left breast and nipple: Secondary | ICD-10-CM | POA: Diagnosis not present

## 2020-10-02 DIAGNOSIS — J449 Chronic obstructive pulmonary disease, unspecified: Secondary | ICD-10-CM | POA: Diagnosis not present

## 2020-10-02 DIAGNOSIS — K2101 Gastro-esophageal reflux disease with esophagitis, with bleeding: Secondary | ICD-10-CM | POA: Diagnosis not present

## 2020-10-02 DIAGNOSIS — Z87891 Personal history of nicotine dependence: Secondary | ICD-10-CM | POA: Diagnosis not present

## 2020-10-02 DIAGNOSIS — E119 Type 2 diabetes mellitus without complications: Secondary | ICD-10-CM | POA: Diagnosis not present

## 2020-10-02 DIAGNOSIS — Z853 Personal history of malignant neoplasm of breast: Secondary | ICD-10-CM | POA: Diagnosis not present

## 2020-10-02 DIAGNOSIS — D500 Iron deficiency anemia secondary to blood loss (chronic): Secondary | ICD-10-CM | POA: Diagnosis not present

## 2020-10-02 DIAGNOSIS — I69351 Hemiplegia and hemiparesis following cerebral infarction affecting right dominant side: Secondary | ICD-10-CM | POA: Diagnosis not present

## 2020-10-02 DIAGNOSIS — K219 Gastro-esophageal reflux disease without esophagitis: Secondary | ICD-10-CM | POA: Diagnosis not present

## 2020-10-02 DIAGNOSIS — I10 Essential (primary) hypertension: Secondary | ICD-10-CM | POA: Diagnosis not present

## 2020-10-02 DIAGNOSIS — E669 Obesity, unspecified: Secondary | ICD-10-CM | POA: Diagnosis not present

## 2020-10-02 DIAGNOSIS — Z9181 History of falling: Secondary | ICD-10-CM | POA: Diagnosis not present

## 2020-10-02 DIAGNOSIS — E785 Hyperlipidemia, unspecified: Secondary | ICD-10-CM | POA: Diagnosis not present

## 2020-10-02 DIAGNOSIS — D630 Anemia in neoplastic disease: Secondary | ICD-10-CM | POA: Diagnosis not present

## 2020-10-02 DIAGNOSIS — N393 Stress incontinence (female) (male): Secondary | ICD-10-CM | POA: Diagnosis not present

## 2020-10-03 LAB — BPAM RBC
Blood Product Expiration Date: 202208062359
Blood Product Expiration Date: 202208062359
ISSUE DATE / TIME: 202207142247
ISSUE DATE / TIME: 202207150124
Unit Type and Rh: 6200
Unit Type and Rh: 6200

## 2020-10-03 LAB — TYPE AND SCREEN
ABO/RH(D): A POS
Antibody Screen: NEGATIVE
Unit division: 0
Unit division: 0

## 2020-10-05 DIAGNOSIS — R531 Weakness: Secondary | ICD-10-CM | POA: Diagnosis not present

## 2020-10-05 DIAGNOSIS — D509 Iron deficiency anemia, unspecified: Secondary | ICD-10-CM | POA: Diagnosis not present

## 2020-10-05 DIAGNOSIS — E119 Type 2 diabetes mellitus without complications: Secondary | ICD-10-CM | POA: Diagnosis not present

## 2020-10-05 DIAGNOSIS — I10 Essential (primary) hypertension: Secondary | ICD-10-CM | POA: Diagnosis not present

## 2020-10-05 DIAGNOSIS — K2101 Gastro-esophageal reflux disease with esophagitis, with bleeding: Secondary | ICD-10-CM | POA: Diagnosis not present

## 2020-10-05 DIAGNOSIS — K5900 Constipation, unspecified: Secondary | ICD-10-CM | POA: Diagnosis not present

## 2020-10-05 DIAGNOSIS — I69351 Hemiplegia and hemiparesis following cerebral infarction affecting right dominant side: Secondary | ICD-10-CM | POA: Diagnosis not present

## 2020-10-05 DIAGNOSIS — K922 Gastrointestinal hemorrhage, unspecified: Secondary | ICD-10-CM | POA: Diagnosis not present

## 2020-10-05 DIAGNOSIS — D500 Iron deficiency anemia secondary to blood loss (chronic): Secondary | ICD-10-CM | POA: Diagnosis not present

## 2020-10-05 DIAGNOSIS — D519 Vitamin B12 deficiency anemia, unspecified: Secondary | ICD-10-CM | POA: Diagnosis not present

## 2020-10-05 DIAGNOSIS — J449 Chronic obstructive pulmonary disease, unspecified: Secondary | ICD-10-CM | POA: Diagnosis not present

## 2020-10-05 DIAGNOSIS — E1169 Type 2 diabetes mellitus with other specified complication: Secondary | ICD-10-CM | POA: Diagnosis not present

## 2020-10-05 DIAGNOSIS — I69961 Other paralytic syndrome following unspecified cerebrovascular disease affecting right dominant side: Secondary | ICD-10-CM | POA: Diagnosis not present

## 2020-10-06 DIAGNOSIS — I10 Essential (primary) hypertension: Secondary | ICD-10-CM | POA: Diagnosis not present

## 2020-10-06 DIAGNOSIS — K2101 Gastro-esophageal reflux disease with esophagitis, with bleeding: Secondary | ICD-10-CM | POA: Diagnosis not present

## 2020-10-06 DIAGNOSIS — E119 Type 2 diabetes mellitus without complications: Secondary | ICD-10-CM | POA: Diagnosis not present

## 2020-10-06 DIAGNOSIS — D500 Iron deficiency anemia secondary to blood loss (chronic): Secondary | ICD-10-CM | POA: Diagnosis not present

## 2020-10-06 DIAGNOSIS — J449 Chronic obstructive pulmonary disease, unspecified: Secondary | ICD-10-CM | POA: Diagnosis not present

## 2020-10-06 DIAGNOSIS — I69351 Hemiplegia and hemiparesis following cerebral infarction affecting right dominant side: Secondary | ICD-10-CM | POA: Diagnosis not present

## 2020-10-07 DIAGNOSIS — K2101 Gastro-esophageal reflux disease with esophagitis, with bleeding: Secondary | ICD-10-CM | POA: Diagnosis not present

## 2020-10-07 DIAGNOSIS — I10 Essential (primary) hypertension: Secondary | ICD-10-CM | POA: Diagnosis not present

## 2020-10-07 DIAGNOSIS — D500 Iron deficiency anemia secondary to blood loss (chronic): Secondary | ICD-10-CM | POA: Diagnosis not present

## 2020-10-07 DIAGNOSIS — J449 Chronic obstructive pulmonary disease, unspecified: Secondary | ICD-10-CM | POA: Diagnosis not present

## 2020-10-07 DIAGNOSIS — I69351 Hemiplegia and hemiparesis following cerebral infarction affecting right dominant side: Secondary | ICD-10-CM | POA: Diagnosis not present

## 2020-10-07 DIAGNOSIS — E119 Type 2 diabetes mellitus without complications: Secondary | ICD-10-CM | POA: Diagnosis not present

## 2020-10-08 DIAGNOSIS — J449 Chronic obstructive pulmonary disease, unspecified: Secondary | ICD-10-CM | POA: Diagnosis not present

## 2020-10-08 DIAGNOSIS — K2101 Gastro-esophageal reflux disease with esophagitis, with bleeding: Secondary | ICD-10-CM | POA: Diagnosis not present

## 2020-10-08 DIAGNOSIS — I69351 Hemiplegia and hemiparesis following cerebral infarction affecting right dominant side: Secondary | ICD-10-CM | POA: Diagnosis not present

## 2020-10-08 DIAGNOSIS — E119 Type 2 diabetes mellitus without complications: Secondary | ICD-10-CM | POA: Diagnosis not present

## 2020-10-08 DIAGNOSIS — D500 Iron deficiency anemia secondary to blood loss (chronic): Secondary | ICD-10-CM | POA: Diagnosis not present

## 2020-10-08 DIAGNOSIS — I10 Essential (primary) hypertension: Secondary | ICD-10-CM | POA: Diagnosis not present

## 2020-10-11 DIAGNOSIS — K2101 Gastro-esophageal reflux disease with esophagitis, with bleeding: Secondary | ICD-10-CM | POA: Diagnosis not present

## 2020-10-11 DIAGNOSIS — E119 Type 2 diabetes mellitus without complications: Secondary | ICD-10-CM | POA: Diagnosis not present

## 2020-10-11 DIAGNOSIS — J449 Chronic obstructive pulmonary disease, unspecified: Secondary | ICD-10-CM | POA: Diagnosis not present

## 2020-10-11 DIAGNOSIS — D500 Iron deficiency anemia secondary to blood loss (chronic): Secondary | ICD-10-CM | POA: Diagnosis not present

## 2020-10-11 DIAGNOSIS — I10 Essential (primary) hypertension: Secondary | ICD-10-CM | POA: Diagnosis not present

## 2020-10-11 DIAGNOSIS — I69351 Hemiplegia and hemiparesis following cerebral infarction affecting right dominant side: Secondary | ICD-10-CM | POA: Diagnosis not present

## 2020-10-12 DIAGNOSIS — K2101 Gastro-esophageal reflux disease with esophagitis, with bleeding: Secondary | ICD-10-CM | POA: Diagnosis not present

## 2020-10-12 DIAGNOSIS — J449 Chronic obstructive pulmonary disease, unspecified: Secondary | ICD-10-CM | POA: Diagnosis not present

## 2020-10-12 DIAGNOSIS — E119 Type 2 diabetes mellitus without complications: Secondary | ICD-10-CM | POA: Diagnosis not present

## 2020-10-12 DIAGNOSIS — I10 Essential (primary) hypertension: Secondary | ICD-10-CM | POA: Diagnosis not present

## 2020-10-12 DIAGNOSIS — D500 Iron deficiency anemia secondary to blood loss (chronic): Secondary | ICD-10-CM | POA: Diagnosis not present

## 2020-10-12 DIAGNOSIS — I69351 Hemiplegia and hemiparesis following cerebral infarction affecting right dominant side: Secondary | ICD-10-CM | POA: Diagnosis not present

## 2020-10-13 DIAGNOSIS — I10 Essential (primary) hypertension: Secondary | ICD-10-CM | POA: Diagnosis not present

## 2020-10-13 DIAGNOSIS — I69351 Hemiplegia and hemiparesis following cerebral infarction affecting right dominant side: Secondary | ICD-10-CM | POA: Diagnosis not present

## 2020-10-13 DIAGNOSIS — D500 Iron deficiency anemia secondary to blood loss (chronic): Secondary | ICD-10-CM | POA: Diagnosis not present

## 2020-10-13 DIAGNOSIS — K2101 Gastro-esophageal reflux disease with esophagitis, with bleeding: Secondary | ICD-10-CM | POA: Diagnosis not present

## 2020-10-13 DIAGNOSIS — J449 Chronic obstructive pulmonary disease, unspecified: Secondary | ICD-10-CM | POA: Diagnosis not present

## 2020-10-13 DIAGNOSIS — E119 Type 2 diabetes mellitus without complications: Secondary | ICD-10-CM | POA: Diagnosis not present

## 2020-10-14 DIAGNOSIS — E119 Type 2 diabetes mellitus without complications: Secondary | ICD-10-CM | POA: Diagnosis not present

## 2020-10-14 DIAGNOSIS — D500 Iron deficiency anemia secondary to blood loss (chronic): Secondary | ICD-10-CM | POA: Diagnosis not present

## 2020-10-14 DIAGNOSIS — I69351 Hemiplegia and hemiparesis following cerebral infarction affecting right dominant side: Secondary | ICD-10-CM | POA: Diagnosis not present

## 2020-10-14 DIAGNOSIS — K2101 Gastro-esophageal reflux disease with esophagitis, with bleeding: Secondary | ICD-10-CM | POA: Diagnosis not present

## 2020-10-14 DIAGNOSIS — J449 Chronic obstructive pulmonary disease, unspecified: Secondary | ICD-10-CM | POA: Diagnosis not present

## 2020-10-14 DIAGNOSIS — I10 Essential (primary) hypertension: Secondary | ICD-10-CM | POA: Diagnosis not present

## 2020-10-18 DIAGNOSIS — I10 Essential (primary) hypertension: Secondary | ICD-10-CM | POA: Diagnosis not present

## 2020-10-18 DIAGNOSIS — E119 Type 2 diabetes mellitus without complications: Secondary | ICD-10-CM | POA: Diagnosis not present

## 2020-10-18 DIAGNOSIS — K2101 Gastro-esophageal reflux disease with esophagitis, with bleeding: Secondary | ICD-10-CM | POA: Diagnosis not present

## 2020-10-18 DIAGNOSIS — D500 Iron deficiency anemia secondary to blood loss (chronic): Secondary | ICD-10-CM | POA: Diagnosis not present

## 2020-10-18 DIAGNOSIS — I69351 Hemiplegia and hemiparesis following cerebral infarction affecting right dominant side: Secondary | ICD-10-CM | POA: Diagnosis not present

## 2020-10-18 DIAGNOSIS — J449 Chronic obstructive pulmonary disease, unspecified: Secondary | ICD-10-CM | POA: Diagnosis not present

## 2020-10-19 DIAGNOSIS — I10 Essential (primary) hypertension: Secondary | ICD-10-CM | POA: Diagnosis not present

## 2020-10-19 DIAGNOSIS — K2101 Gastro-esophageal reflux disease with esophagitis, with bleeding: Secondary | ICD-10-CM | POA: Diagnosis not present

## 2020-10-19 DIAGNOSIS — D500 Iron deficiency anemia secondary to blood loss (chronic): Secondary | ICD-10-CM | POA: Diagnosis not present

## 2020-10-19 DIAGNOSIS — J449 Chronic obstructive pulmonary disease, unspecified: Secondary | ICD-10-CM | POA: Diagnosis not present

## 2020-10-19 DIAGNOSIS — I69351 Hemiplegia and hemiparesis following cerebral infarction affecting right dominant side: Secondary | ICD-10-CM | POA: Diagnosis not present

## 2020-10-19 DIAGNOSIS — E119 Type 2 diabetes mellitus without complications: Secondary | ICD-10-CM | POA: Diagnosis not present

## 2020-10-21 DIAGNOSIS — I10 Essential (primary) hypertension: Secondary | ICD-10-CM | POA: Diagnosis not present

## 2020-10-21 DIAGNOSIS — E119 Type 2 diabetes mellitus without complications: Secondary | ICD-10-CM | POA: Diagnosis not present

## 2020-10-21 DIAGNOSIS — I69351 Hemiplegia and hemiparesis following cerebral infarction affecting right dominant side: Secondary | ICD-10-CM | POA: Diagnosis not present

## 2020-10-21 DIAGNOSIS — J449 Chronic obstructive pulmonary disease, unspecified: Secondary | ICD-10-CM | POA: Diagnosis not present

## 2020-10-21 DIAGNOSIS — K2101 Gastro-esophageal reflux disease with esophagitis, with bleeding: Secondary | ICD-10-CM | POA: Diagnosis not present

## 2020-10-21 DIAGNOSIS — D500 Iron deficiency anemia secondary to blood loss (chronic): Secondary | ICD-10-CM | POA: Diagnosis not present

## 2020-10-25 DIAGNOSIS — E119 Type 2 diabetes mellitus without complications: Secondary | ICD-10-CM | POA: Diagnosis not present

## 2020-10-25 DIAGNOSIS — I10 Essential (primary) hypertension: Secondary | ICD-10-CM | POA: Diagnosis not present

## 2020-10-25 DIAGNOSIS — K2101 Gastro-esophageal reflux disease with esophagitis, with bleeding: Secondary | ICD-10-CM | POA: Diagnosis not present

## 2020-10-25 DIAGNOSIS — J449 Chronic obstructive pulmonary disease, unspecified: Secondary | ICD-10-CM | POA: Diagnosis not present

## 2020-10-25 DIAGNOSIS — D500 Iron deficiency anemia secondary to blood loss (chronic): Secondary | ICD-10-CM | POA: Diagnosis not present

## 2020-10-25 DIAGNOSIS — I69351 Hemiplegia and hemiparesis following cerebral infarction affecting right dominant side: Secondary | ICD-10-CM | POA: Diagnosis not present

## 2020-10-27 DIAGNOSIS — D500 Iron deficiency anemia secondary to blood loss (chronic): Secondary | ICD-10-CM | POA: Diagnosis not present

## 2020-10-27 DIAGNOSIS — I69351 Hemiplegia and hemiparesis following cerebral infarction affecting right dominant side: Secondary | ICD-10-CM | POA: Diagnosis not present

## 2020-10-27 DIAGNOSIS — K2101 Gastro-esophageal reflux disease with esophagitis, with bleeding: Secondary | ICD-10-CM | POA: Diagnosis not present

## 2020-10-27 DIAGNOSIS — I10 Essential (primary) hypertension: Secondary | ICD-10-CM | POA: Diagnosis not present

## 2020-10-27 DIAGNOSIS — J449 Chronic obstructive pulmonary disease, unspecified: Secondary | ICD-10-CM | POA: Diagnosis not present

## 2020-10-27 DIAGNOSIS — E119 Type 2 diabetes mellitus without complications: Secondary | ICD-10-CM | POA: Diagnosis not present

## 2020-11-01 DIAGNOSIS — E669 Obesity, unspecified: Secondary | ICD-10-CM | POA: Diagnosis not present

## 2020-11-01 DIAGNOSIS — Z6834 Body mass index (BMI) 34.0-34.9, adult: Secondary | ICD-10-CM | POA: Diagnosis not present

## 2020-11-01 DIAGNOSIS — K219 Gastro-esophageal reflux disease without esophagitis: Secondary | ICD-10-CM | POA: Diagnosis not present

## 2020-11-01 DIAGNOSIS — I10 Essential (primary) hypertension: Secondary | ICD-10-CM | POA: Diagnosis not present

## 2020-11-01 DIAGNOSIS — N393 Stress incontinence (female) (male): Secondary | ICD-10-CM | POA: Diagnosis not present

## 2020-11-01 DIAGNOSIS — I201 Angina pectoris with documented spasm: Secondary | ICD-10-CM | POA: Diagnosis not present

## 2020-11-01 DIAGNOSIS — Z79899 Other long term (current) drug therapy: Secondary | ICD-10-CM | POA: Diagnosis not present

## 2020-11-01 DIAGNOSIS — E785 Hyperlipidemia, unspecified: Secondary | ICD-10-CM | POA: Diagnosis not present

## 2020-11-01 DIAGNOSIS — Z9181 History of falling: Secondary | ICD-10-CM | POA: Diagnosis not present

## 2020-11-01 DIAGNOSIS — Z87891 Personal history of nicotine dependence: Secondary | ICD-10-CM | POA: Diagnosis not present

## 2020-11-01 DIAGNOSIS — E538 Deficiency of other specified B group vitamins: Secondary | ICD-10-CM | POA: Diagnosis not present

## 2020-11-01 DIAGNOSIS — K2101 Gastro-esophageal reflux disease with esophagitis, with bleeding: Secondary | ICD-10-CM | POA: Diagnosis not present

## 2020-11-01 DIAGNOSIS — D630 Anemia in neoplastic disease: Secondary | ICD-10-CM | POA: Diagnosis not present

## 2020-11-01 DIAGNOSIS — Z9012 Acquired absence of left breast and nipple: Secondary | ICD-10-CM | POA: Diagnosis not present

## 2020-11-01 DIAGNOSIS — D500 Iron deficiency anemia secondary to blood loss (chronic): Secondary | ICD-10-CM | POA: Diagnosis not present

## 2020-11-01 DIAGNOSIS — M810 Age-related osteoporosis without current pathological fracture: Secondary | ICD-10-CM | POA: Diagnosis not present

## 2020-11-01 DIAGNOSIS — J449 Chronic obstructive pulmonary disease, unspecified: Secondary | ICD-10-CM | POA: Diagnosis not present

## 2020-11-01 DIAGNOSIS — D126 Benign neoplasm of colon, unspecified: Secondary | ICD-10-CM | POA: Diagnosis not present

## 2020-11-01 DIAGNOSIS — I69351 Hemiplegia and hemiparesis following cerebral infarction affecting right dominant side: Secondary | ICD-10-CM | POA: Diagnosis not present

## 2020-11-01 DIAGNOSIS — E119 Type 2 diabetes mellitus without complications: Secondary | ICD-10-CM | POA: Diagnosis not present

## 2020-11-01 DIAGNOSIS — Z853 Personal history of malignant neoplasm of breast: Secondary | ICD-10-CM | POA: Diagnosis not present

## 2020-11-01 DIAGNOSIS — F32A Depression, unspecified: Secondary | ICD-10-CM | POA: Diagnosis not present

## 2020-11-02 DIAGNOSIS — D500 Iron deficiency anemia secondary to blood loss (chronic): Secondary | ICD-10-CM | POA: Diagnosis not present

## 2020-11-02 DIAGNOSIS — E119 Type 2 diabetes mellitus without complications: Secondary | ICD-10-CM | POA: Diagnosis not present

## 2020-11-02 DIAGNOSIS — K2101 Gastro-esophageal reflux disease with esophagitis, with bleeding: Secondary | ICD-10-CM | POA: Diagnosis not present

## 2020-11-02 DIAGNOSIS — I69351 Hemiplegia and hemiparesis following cerebral infarction affecting right dominant side: Secondary | ICD-10-CM | POA: Diagnosis not present

## 2020-11-02 DIAGNOSIS — J449 Chronic obstructive pulmonary disease, unspecified: Secondary | ICD-10-CM | POA: Diagnosis not present

## 2020-11-02 DIAGNOSIS — I10 Essential (primary) hypertension: Secondary | ICD-10-CM | POA: Diagnosis not present

## 2020-11-04 DIAGNOSIS — I69351 Hemiplegia and hemiparesis following cerebral infarction affecting right dominant side: Secondary | ICD-10-CM | POA: Diagnosis not present

## 2020-11-04 DIAGNOSIS — D500 Iron deficiency anemia secondary to blood loss (chronic): Secondary | ICD-10-CM | POA: Diagnosis not present

## 2020-11-04 DIAGNOSIS — E119 Type 2 diabetes mellitus without complications: Secondary | ICD-10-CM | POA: Diagnosis not present

## 2020-11-04 DIAGNOSIS — I10 Essential (primary) hypertension: Secondary | ICD-10-CM | POA: Diagnosis not present

## 2020-11-04 DIAGNOSIS — J449 Chronic obstructive pulmonary disease, unspecified: Secondary | ICD-10-CM | POA: Diagnosis not present

## 2020-11-04 DIAGNOSIS — K2101 Gastro-esophageal reflux disease with esophagitis, with bleeding: Secondary | ICD-10-CM | POA: Diagnosis not present

## 2020-11-09 DIAGNOSIS — I10 Essential (primary) hypertension: Secondary | ICD-10-CM | POA: Diagnosis not present

## 2020-11-09 DIAGNOSIS — K2101 Gastro-esophageal reflux disease with esophagitis, with bleeding: Secondary | ICD-10-CM | POA: Diagnosis not present

## 2020-11-09 DIAGNOSIS — E119 Type 2 diabetes mellitus without complications: Secondary | ICD-10-CM | POA: Diagnosis not present

## 2020-11-09 DIAGNOSIS — J449 Chronic obstructive pulmonary disease, unspecified: Secondary | ICD-10-CM | POA: Diagnosis not present

## 2020-11-09 DIAGNOSIS — I69351 Hemiplegia and hemiparesis following cerebral infarction affecting right dominant side: Secondary | ICD-10-CM | POA: Diagnosis not present

## 2020-11-09 DIAGNOSIS — D500 Iron deficiency anemia secondary to blood loss (chronic): Secondary | ICD-10-CM | POA: Diagnosis not present

## 2020-11-11 DIAGNOSIS — J449 Chronic obstructive pulmonary disease, unspecified: Secondary | ICD-10-CM | POA: Diagnosis not present

## 2020-11-11 DIAGNOSIS — I69351 Hemiplegia and hemiparesis following cerebral infarction affecting right dominant side: Secondary | ICD-10-CM | POA: Diagnosis not present

## 2020-11-11 DIAGNOSIS — D500 Iron deficiency anemia secondary to blood loss (chronic): Secondary | ICD-10-CM | POA: Diagnosis not present

## 2020-11-11 DIAGNOSIS — E119 Type 2 diabetes mellitus without complications: Secondary | ICD-10-CM | POA: Diagnosis not present

## 2020-11-11 DIAGNOSIS — I10 Essential (primary) hypertension: Secondary | ICD-10-CM | POA: Diagnosis not present

## 2020-11-11 DIAGNOSIS — K2101 Gastro-esophageal reflux disease with esophagitis, with bleeding: Secondary | ICD-10-CM | POA: Diagnosis not present

## 2020-11-16 DIAGNOSIS — D509 Iron deficiency anemia, unspecified: Secondary | ICD-10-CM | POA: Diagnosis not present

## 2020-11-16 DIAGNOSIS — K922 Gastrointestinal hemorrhage, unspecified: Secondary | ICD-10-CM | POA: Diagnosis not present

## 2020-11-16 DIAGNOSIS — I69961 Other paralytic syndrome following unspecified cerebrovascular disease affecting right dominant side: Secondary | ICD-10-CM | POA: Diagnosis not present

## 2020-11-16 DIAGNOSIS — E1169 Type 2 diabetes mellitus with other specified complication: Secondary | ICD-10-CM | POA: Diagnosis not present

## 2020-11-16 DIAGNOSIS — D519 Vitamin B12 deficiency anemia, unspecified: Secondary | ICD-10-CM | POA: Diagnosis not present

## 2020-11-17 DIAGNOSIS — K2101 Gastro-esophageal reflux disease with esophagitis, with bleeding: Secondary | ICD-10-CM | POA: Diagnosis not present

## 2020-11-17 DIAGNOSIS — D500 Iron deficiency anemia secondary to blood loss (chronic): Secondary | ICD-10-CM | POA: Diagnosis not present

## 2020-11-17 DIAGNOSIS — I69351 Hemiplegia and hemiparesis following cerebral infarction affecting right dominant side: Secondary | ICD-10-CM | POA: Diagnosis not present

## 2020-11-17 DIAGNOSIS — I10 Essential (primary) hypertension: Secondary | ICD-10-CM | POA: Diagnosis not present

## 2020-11-17 DIAGNOSIS — E119 Type 2 diabetes mellitus without complications: Secondary | ICD-10-CM | POA: Diagnosis not present

## 2020-11-17 DIAGNOSIS — J449 Chronic obstructive pulmonary disease, unspecified: Secondary | ICD-10-CM | POA: Diagnosis not present

## 2020-11-24 DIAGNOSIS — K2101 Gastro-esophageal reflux disease with esophagitis, with bleeding: Secondary | ICD-10-CM | POA: Diagnosis not present

## 2020-11-24 DIAGNOSIS — J449 Chronic obstructive pulmonary disease, unspecified: Secondary | ICD-10-CM | POA: Diagnosis not present

## 2020-11-24 DIAGNOSIS — I69351 Hemiplegia and hemiparesis following cerebral infarction affecting right dominant side: Secondary | ICD-10-CM | POA: Diagnosis not present

## 2020-11-24 DIAGNOSIS — D500 Iron deficiency anemia secondary to blood loss (chronic): Secondary | ICD-10-CM | POA: Diagnosis not present

## 2020-11-24 DIAGNOSIS — I10 Essential (primary) hypertension: Secondary | ICD-10-CM | POA: Diagnosis not present

## 2020-11-24 DIAGNOSIS — E119 Type 2 diabetes mellitus without complications: Secondary | ICD-10-CM | POA: Diagnosis not present

## 2021-02-16 ENCOUNTER — Other Ambulatory Visit: Payer: Self-pay

## 2021-02-16 MED ORDER — ISOSORBIDE MONONITRATE ER 60 MG PO TB24: 60.0000 mg | ORAL_TABLET | Freq: Every day | ORAL | 0 refills | Status: DC

## 2021-04-11 DIAGNOSIS — E785 Hyperlipidemia, unspecified: Secondary | ICD-10-CM | POA: Diagnosis not present

## 2021-04-11 DIAGNOSIS — M810 Age-related osteoporosis without current pathological fracture: Secondary | ICD-10-CM | POA: Diagnosis not present

## 2021-04-11 DIAGNOSIS — D519 Vitamin B12 deficiency anemia, unspecified: Secondary | ICD-10-CM | POA: Diagnosis not present

## 2021-04-11 DIAGNOSIS — E1169 Type 2 diabetes mellitus with other specified complication: Secondary | ICD-10-CM | POA: Diagnosis not present

## 2021-04-13 DIAGNOSIS — R82998 Other abnormal findings in urine: Secondary | ICD-10-CM | POA: Diagnosis not present

## 2021-04-13 DIAGNOSIS — M810 Age-related osteoporosis without current pathological fracture: Secondary | ICD-10-CM | POA: Diagnosis not present

## 2021-04-13 DIAGNOSIS — E1169 Type 2 diabetes mellitus with other specified complication: Secondary | ICD-10-CM | POA: Diagnosis not present

## 2021-04-13 DIAGNOSIS — D519 Vitamin B12 deficiency anemia, unspecified: Secondary | ICD-10-CM | POA: Diagnosis not present

## 2021-04-17 DIAGNOSIS — J449 Chronic obstructive pulmonary disease, unspecified: Secondary | ICD-10-CM | POA: Diagnosis not present

## 2021-04-17 DIAGNOSIS — I700 Atherosclerosis of aorta: Secondary | ICD-10-CM | POA: Diagnosis not present

## 2021-04-17 DIAGNOSIS — E785 Hyperlipidemia, unspecified: Secondary | ICD-10-CM | POA: Diagnosis not present

## 2021-04-17 DIAGNOSIS — M810 Age-related osteoporosis without current pathological fracture: Secondary | ICD-10-CM | POA: Diagnosis not present

## 2021-04-18 DIAGNOSIS — D509 Iron deficiency anemia, unspecified: Secondary | ICD-10-CM | POA: Diagnosis not present

## 2021-04-18 DIAGNOSIS — Z1331 Encounter for screening for depression: Secondary | ICD-10-CM | POA: Diagnosis not present

## 2021-04-18 DIAGNOSIS — Z23 Encounter for immunization: Secondary | ICD-10-CM | POA: Diagnosis not present

## 2021-04-18 DIAGNOSIS — I700 Atherosclerosis of aorta: Secondary | ICD-10-CM | POA: Diagnosis not present

## 2021-04-18 DIAGNOSIS — Z1339 Encounter for screening examination for other mental health and behavioral disorders: Secondary | ICD-10-CM | POA: Diagnosis not present

## 2021-04-18 DIAGNOSIS — E785 Hyperlipidemia, unspecified: Secondary | ICD-10-CM | POA: Diagnosis not present

## 2021-04-18 DIAGNOSIS — E1169 Type 2 diabetes mellitus with other specified complication: Secondary | ICD-10-CM | POA: Diagnosis not present

## 2021-04-18 DIAGNOSIS — K922 Gastrointestinal hemorrhage, unspecified: Secondary | ICD-10-CM | POA: Diagnosis not present

## 2021-04-18 DIAGNOSIS — Z0000 Encounter for general adult medical examination without abnormal findings: Secondary | ICD-10-CM | POA: Diagnosis not present

## 2021-04-18 DIAGNOSIS — J449 Chronic obstructive pulmonary disease, unspecified: Secondary | ICD-10-CM | POA: Diagnosis not present

## 2021-04-18 DIAGNOSIS — R5383 Other fatigue: Secondary | ICD-10-CM | POA: Diagnosis not present

## 2021-04-18 DIAGNOSIS — I69961 Other paralytic syndrome following unspecified cerebrovascular disease affecting right dominant side: Secondary | ICD-10-CM | POA: Diagnosis not present

## 2021-04-18 DIAGNOSIS — D519 Vitamin B12 deficiency anemia, unspecified: Secondary | ICD-10-CM | POA: Diagnosis not present

## 2021-04-18 DIAGNOSIS — F330 Major depressive disorder, recurrent, mild: Secondary | ICD-10-CM | POA: Diagnosis not present

## 2021-04-18 DIAGNOSIS — M810 Age-related osteoporosis without current pathological fracture: Secondary | ICD-10-CM | POA: Diagnosis not present

## 2021-05-10 ENCOUNTER — Emergency Department (HOSPITAL_COMMUNITY): Payer: Medicare Other

## 2021-05-10 ENCOUNTER — Emergency Department (HOSPITAL_COMMUNITY)
Admission: EM | Admit: 2021-05-10 | Discharge: 2021-05-10 | Disposition: A | Discharge status: Home / Self Care | Payer: Medicare Other | Attending: Emergency Medicine | Admitting: Emergency Medicine

## 2021-05-10 ENCOUNTER — Encounter (HOSPITAL_COMMUNITY): Payer: Self-pay

## 2021-05-10 DIAGNOSIS — S0181XA Laceration without foreign body of other part of head, initial encounter: Secondary | ICD-10-CM | POA: Insufficient documentation

## 2021-05-10 DIAGNOSIS — S199XXA Unspecified injury of neck, initial encounter: Secondary | ICD-10-CM | POA: Diagnosis not present

## 2021-05-10 DIAGNOSIS — E119 Type 2 diabetes mellitus without complications: Secondary | ICD-10-CM | POA: Insufficient documentation

## 2021-05-10 DIAGNOSIS — S0990XA Unspecified injury of head, initial encounter: Secondary | ICD-10-CM | POA: Diagnosis not present

## 2021-05-10 DIAGNOSIS — Z79899 Other long term (current) drug therapy: Secondary | ICD-10-CM | POA: Insufficient documentation

## 2021-05-10 DIAGNOSIS — Y92481 Parking lot as the place of occurrence of the external cause: Secondary | ICD-10-CM | POA: Diagnosis not present

## 2021-05-10 DIAGNOSIS — Z7984 Long term (current) use of oral hypoglycemic drugs: Secondary | ICD-10-CM | POA: Insufficient documentation

## 2021-05-10 DIAGNOSIS — W052XXA Fall from non-moving motorized mobility scooter, initial encounter: Secondary | ICD-10-CM | POA: Diagnosis not present

## 2021-05-10 DIAGNOSIS — S0993XA Unspecified injury of face, initial encounter: Secondary | ICD-10-CM | POA: Diagnosis not present

## 2021-05-10 DIAGNOSIS — I517 Cardiomegaly: Secondary | ICD-10-CM | POA: Diagnosis not present

## 2021-05-10 DIAGNOSIS — M79641 Pain in right hand: Secondary | ICD-10-CM | POA: Insufficient documentation

## 2021-05-10 DIAGNOSIS — I700 Atherosclerosis of aorta: Secondary | ICD-10-CM | POA: Diagnosis not present

## 2021-05-10 DIAGNOSIS — W19XXXA Unspecified fall, initial encounter: Secondary | ICD-10-CM

## 2021-05-10 DIAGNOSIS — M79601 Pain in right arm: Secondary | ICD-10-CM | POA: Diagnosis not present

## 2021-05-10 DIAGNOSIS — Z7902 Long term (current) use of antithrombotics/antiplatelets: Secondary | ICD-10-CM | POA: Diagnosis not present

## 2021-05-10 DIAGNOSIS — S2241XA Multiple fractures of ribs, right side, initial encounter for closed fracture: Secondary | ICD-10-CM | POA: Diagnosis not present

## 2021-05-10 DIAGNOSIS — I10 Essential (primary) hypertension: Secondary | ICD-10-CM | POA: Diagnosis not present

## 2021-05-10 DIAGNOSIS — Z043 Encounter for examination and observation following other accident: Secondary | ICD-10-CM | POA: Diagnosis not present

## 2021-05-10 DIAGNOSIS — R0781 Pleurodynia: Secondary | ICD-10-CM | POA: Diagnosis not present

## 2021-05-10 MED ORDER — ACETAMINOPHEN 325 MG PO TABS
650.0000 mg | ORAL_TABLET | Freq: Once | ORAL | Status: AC
  Administered 2021-05-10: 650 mg via ORAL
  Filled 2021-05-10: qty 2
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
## 2021-05-10 NOTE — Discharge Instructions (Signed)
Recommend following up with your primary care doctor for recheck in the next couple days.  Come back to ER if you have any additional falls, worsening leg pain, any chest pain or difficulty in breathing or other new concerning symptom.
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
## 2021-05-15 ENCOUNTER — Other Ambulatory Visit: Payer: Self-pay | Admitting: Interventional Cardiology

## 2021-05-24 DIAGNOSIS — I69961 Other paralytic syndrome following unspecified cerebrovascular disease affecting right dominant side: Secondary | ICD-10-CM | POA: Diagnosis not present

## 2021-05-24 DIAGNOSIS — M810 Age-related osteoporosis without current pathological fracture: Secondary | ICD-10-CM | POA: Diagnosis not present

## 2021-05-31 ENCOUNTER — Other Ambulatory Visit: Payer: Self-pay | Admitting: Interventional Cardiology

## 2021-06-01 ENCOUNTER — Other Ambulatory Visit: Payer: Self-pay | Admitting: *Deleted

## 2021-06-01 MED ORDER — ISOSORBIDE MONONITRATE ER 60 MG PO TB24: 60.0000 mg | ORAL_TABLET | Freq: Every day | ORAL | 0 refills | Status: AC

## 2021-06-17 DIAGNOSIS — J449 Chronic obstructive pulmonary disease, unspecified: Secondary | ICD-10-CM | POA: Diagnosis not present

## 2021-06-17 DIAGNOSIS — E785 Hyperlipidemia, unspecified: Secondary | ICD-10-CM | POA: Diagnosis not present

## 2021-06-17 DIAGNOSIS — M810 Age-related osteoporosis without current pathological fracture: Secondary | ICD-10-CM | POA: Diagnosis not present

## 2021-06-17 DIAGNOSIS — I700 Atherosclerosis of aorta: Secondary | ICD-10-CM | POA: Diagnosis not present

## 2021-09-16 DIAGNOSIS — E1169 Type 2 diabetes mellitus with other specified complication: Secondary | ICD-10-CM | POA: Diagnosis not present

## 2021-09-16 DIAGNOSIS — E785 Hyperlipidemia, unspecified: Secondary | ICD-10-CM | POA: Diagnosis not present

## 2021-09-16 DIAGNOSIS — J449 Chronic obstructive pulmonary disease, unspecified: Secondary | ICD-10-CM | POA: Diagnosis not present

## 2021-10-31 DIAGNOSIS — I700 Atherosclerosis of aorta: Secondary | ICD-10-CM | POA: Diagnosis not present

## 2021-10-31 DIAGNOSIS — R5383 Other fatigue: Secondary | ICD-10-CM | POA: Diagnosis not present

## 2021-10-31 DIAGNOSIS — E1169 Type 2 diabetes mellitus with other specified complication: Secondary | ICD-10-CM | POA: Diagnosis not present

## 2021-10-31 DIAGNOSIS — D519 Vitamin B12 deficiency anemia, unspecified: Secondary | ICD-10-CM | POA: Diagnosis not present

## 2021-10-31 DIAGNOSIS — F330 Major depressive disorder, recurrent, mild: Secondary | ICD-10-CM | POA: Diagnosis not present

## 2021-10-31 DIAGNOSIS — J449 Chronic obstructive pulmonary disease, unspecified: Secondary | ICD-10-CM | POA: Diagnosis not present

## 2021-10-31 DIAGNOSIS — I69961 Other paralytic syndrome following unspecified cerebrovascular disease affecting right dominant side: Secondary | ICD-10-CM | POA: Diagnosis not present

## 2021-10-31 DIAGNOSIS — Z23 Encounter for immunization: Secondary | ICD-10-CM | POA: Diagnosis not present

## 2021-10-31 DIAGNOSIS — E785 Hyperlipidemia, unspecified: Secondary | ICD-10-CM | POA: Diagnosis not present

## 2021-10-31 DIAGNOSIS — D509 Iron deficiency anemia, unspecified: Secondary | ICD-10-CM | POA: Diagnosis not present

## 2021-10-31 DIAGNOSIS — M810 Age-related osteoporosis without current pathological fracture: Secondary | ICD-10-CM | POA: Diagnosis not present

## 2021-10-31 DIAGNOSIS — R32 Unspecified urinary incontinence: Secondary | ICD-10-CM | POA: Diagnosis not present

## 2021-12-12 DIAGNOSIS — N63 Unspecified lump in unspecified breast: Secondary | ICD-10-CM | POA: Diagnosis not present

## 2021-12-13 ENCOUNTER — Other Ambulatory Visit: Payer: Self-pay | Admitting: Obstetrics and Gynecology

## 2021-12-13 DIAGNOSIS — N6311 Unspecified lump in the right breast, upper outer quadrant: Secondary | ICD-10-CM

## 2021-12-29 DIAGNOSIS — N3944 Nocturnal enuresis: Secondary | ICD-10-CM | POA: Diagnosis not present

## 2021-12-29 DIAGNOSIS — R350 Frequency of micturition: Secondary | ICD-10-CM | POA: Diagnosis not present

## 2021-12-29 DIAGNOSIS — N3946 Mixed incontinence: Secondary | ICD-10-CM | POA: Diagnosis not present

## 2022-01-09 DIAGNOSIS — Z1152 Encounter for screening for COVID-19: Secondary | ICD-10-CM | POA: Diagnosis not present

## 2022-01-09 DIAGNOSIS — R0981 Nasal congestion: Secondary | ICD-10-CM | POA: Diagnosis not present

## 2022-01-09 DIAGNOSIS — R5383 Other fatigue: Secondary | ICD-10-CM | POA: Diagnosis not present

## 2022-01-09 DIAGNOSIS — J069 Acute upper respiratory infection, unspecified: Secondary | ICD-10-CM | POA: Diagnosis not present

## 2022-01-09 DIAGNOSIS — J449 Chronic obstructive pulmonary disease, unspecified: Secondary | ICD-10-CM | POA: Diagnosis not present

## 2022-01-09 DIAGNOSIS — R051 Acute cough: Secondary | ICD-10-CM | POA: Diagnosis not present

## 2022-01-30 DIAGNOSIS — S99911A Unspecified injury of right ankle, initial encounter: Secondary | ICD-10-CM | POA: Diagnosis not present

## 2022-01-30 DIAGNOSIS — I69961 Other paralytic syndrome following unspecified cerebrovascular disease affecting right dominant side: Secondary | ICD-10-CM | POA: Diagnosis not present

## 2022-01-30 DIAGNOSIS — M21371 Foot drop, right foot: Secondary | ICD-10-CM | POA: Diagnosis not present

## 2022-02-08 ENCOUNTER — Other Ambulatory Visit: Payer: Medicare Other

## 2022-02-15 DIAGNOSIS — N3000 Acute cystitis without hematuria: Secondary | ICD-10-CM | POA: Diagnosis not present

## 2022-02-15 DIAGNOSIS — N3946 Mixed incontinence: Secondary | ICD-10-CM | POA: Diagnosis not present

## 2022-03-29 DIAGNOSIS — B9620 Unspecified Escherichia coli [E. coli] as the cause of diseases classified elsewhere: Secondary | ICD-10-CM | POA: Diagnosis not present

## 2022-03-29 DIAGNOSIS — N3946 Mixed incontinence: Secondary | ICD-10-CM | POA: Diagnosis not present

## 2022-03-29 DIAGNOSIS — N390 Urinary tract infection, site not specified: Secondary | ICD-10-CM | POA: Diagnosis not present

## 2022-03-31 ENCOUNTER — Other Ambulatory Visit: Payer: Self-pay | Admitting: Obstetrics and Gynecology

## 2022-03-31 ENCOUNTER — Ambulatory Visit
Admission: RE | Admit: 2022-03-31 | Discharge: 2022-03-31 | Disposition: A | Discharge status: Home / Self Care | Payer: Medicare Other | Source: Ambulatory Visit | Attending: Obstetrics and Gynecology | Admitting: Obstetrics and Gynecology

## 2022-03-31 DIAGNOSIS — N6312 Unspecified lump in the right breast, upper inner quadrant: Secondary | ICD-10-CM | POA: Diagnosis not present

## 2022-03-31 DIAGNOSIS — N6311 Unspecified lump in the right breast, upper outer quadrant: Secondary | ICD-10-CM

## 2022-04-05 ENCOUNTER — Other Ambulatory Visit: Payer: TRICARE For Life (TFL)

## 2022-04-10 ENCOUNTER — Ambulatory Visit
Admission: RE | Admit: 2022-04-10 | Discharge: 2022-04-10 | Disposition: A | Discharge status: Home / Self Care | Payer: Medicare Other | Source: Ambulatory Visit | Attending: Obstetrics and Gynecology | Admitting: Obstetrics and Gynecology

## 2022-04-10 DIAGNOSIS — N6311 Unspecified lump in the right breast, upper outer quadrant: Secondary | ICD-10-CM

## 2022-04-10 DIAGNOSIS — C50111 Malignant neoplasm of central portion of right female breast: Secondary | ICD-10-CM | POA: Diagnosis not present

## 2022-04-10 DIAGNOSIS — N6312 Unspecified lump in the right breast, upper inner quadrant: Secondary | ICD-10-CM | POA: Diagnosis not present

## 2022-04-10 DIAGNOSIS — C50811 Malignant neoplasm of overlapping sites of right female breast: Secondary | ICD-10-CM | POA: Diagnosis not present

## 2022-04-10 DIAGNOSIS — C50211 Malignant neoplasm of upper-inner quadrant of right female breast: Secondary | ICD-10-CM | POA: Diagnosis not present

## 2022-04-10 HISTORY — PX: BREAST BIOPSY: SHX20

## 2022-04-14 ENCOUNTER — Ambulatory Visit: Payer: Self-pay | Admitting: Surgery

## 2022-04-14 ENCOUNTER — Telehealth: Payer: Self-pay | Admitting: *Deleted

## 2022-04-14 DIAGNOSIS — C50911 Malignant neoplasm of unspecified site of right female breast: Secondary | ICD-10-CM | POA: Diagnosis not present

## 2022-04-14 NOTE — Telephone Encounter (Signed)
   Pre-operative Risk Assessment    Patient Name: [facility] Paul  DOB: 09/12/1945 MRN: [id number]      Request for Surgical Clearance    Procedure:   LEFT BREAST MASTECTOMY  Date of Surgery:  Clearance TBD                                 Surgeon:  DR. MATTHEW TSUEI Surgeon's Group or Practice Name:  [facility] Phone number:  [phone] Fax number:  [phone] ATTN: [name], LPN   Type of Clearance Requested:   - Medical  - Pharmacy:  Hold Clopidogrel (Plavix)     Type of Anesthesia:  General    Additional requests/questions:    [name]   04/14/2022, 12:09 PM

## 2022-04-14 NOTE — H&P (Signed)
Subjective    Chief Complaint: New Consultation (Breast cancer)   Cardiology - [facility] Tamala Julian)   History of Present Illness: Brandy Paul is a 77 y.o. female who is seen today as an office consultation at the request of Dr. Mardelle Matte for evaluation of New Consultation (Breast cancer) .   This is a 77 year old female with a very complex medical history who presents with a new diagnosis of invasive lobular carcinoma of the right breast.  The patient is wheelchair-bound after stroke about 11 years ago.  In 2003, the patient underwent a left mastectomy for Paget's disease, as well as right breast reduction.  Surgery was performed at [facility].  She had reconstruction of the left mastectomy with a tissue expander.  However, her reconstruction was never completed.  She still has a tissue expander in place that has never been fully filled or replaced.   Over the last several months, the patient has noted firmness and retraction of the right nipple.  She underwent evaluation with mammogram and ultrasound.  In the right breast at 9:00 in the retroareolar region, there is a 3.4 x 1.4 x 2.2 cm mass.  At 1:00 located 1 cm from the nipple there is a 1.0 x 1.0 x 0.7 cm mass.  She underwent biopsies of both of these areas.  The larger mass at 9:00 revealed invasive lobular carcinoma grade 1 ER positive PR negative Ki-67 40% HER2 pending.  The smaller mass located at 1:00 revealed invasive lobular carcinoma grade 2 ER/PR positive, Ki-67 40% HER2 pending.  Ultrasound of the axilla was limited due to immobility but showed no enlarged axillary lymph nodes.  She is accompanied by her husband today.   She is scheduled to see cardiology next week.  She remains anticoagulated on Plavix.   She underwent laparoscopic cholecystectomy and laparoscopic appendectomy by Dr. Hassell Done in 2019.   Review of Systems: A complete review of systems was obtained from the patient.  I have reviewed this information and  discussed as appropriate with the patient.  See HPI as well for other ROS.   Review of Systems  Constitutional: Negative.   HENT: Negative.    Eyes: Negative.   Respiratory: Negative.    Cardiovascular: Negative.   Gastrointestinal: Negative.   Genitourinary: Negative.   Musculoskeletal: Negative.   Skin: Negative.   Neurological: Negative.   Endo/Heme/Allergies: Negative.   Psychiatric/Behavioral: Negative.          Medical History: Past Medical History      Past Medical History:  Diagnosis Date   CHF (congestive heart failure) (CMS-HCC)     COPD (chronic obstructive pulmonary disease) (CMS-HCC)     History of cancer     History of stroke             Patient Active Problem List  Diagnosis   Invasive lobular carcinoma of right breast in female (CMS-HCC)   Centrilobular emphysema (CMS-HCC)   COPD (chronic obstructive pulmonary disease) (CMS-HCC)   Coronary artery spasm (CMS-HCC)   DOE (dyspnea on exertion)   Essential hypertension   Encounter for long-term (current) use of antiplatelets/antithrombotics   Gastroesophageal reflux disease without esophagitis   Hemiparesis affecting right side as late effect of cerebrovascular accident (CMS-HCC)   TIA (transient ischemic attack)      Past Surgical History       Past Surgical History:  Procedure Laterality Date   APPENDECTOMY       CHOLECYSTECTOMY       HYSTERECTOMY  MASTECTOMY BILATERAL SIMPLE            Allergies       Allergies  Allergen Reactions   Cefaclor Other (See Comments) and Unknown      REACTION: "SEVERE HEADACHE"              Current Outpatient Medications on File Prior to Visit  Medication Sig Dispense Refill   atorvastatin (LIPITOR) 40 MG tablet Take 40 mg by mouth once daily       buPROPion (WELLBUTRIN SR) 150 MG SR tablet Take 150 mg by mouth once daily       calcium carbonate 600 mg calcium (1,500 mg) Tab tablet Take by mouth       clopidogreL (PLAVIX) 75 mg tablet Take 75 mg by  mouth once daily       ezetimibe (ZETIA) 10 mg tablet Take 10 mg by mouth once daily       isosorbide mononitrate (IMDUR) 60 MG ER tablet Take 60 mg by mouth once daily       pantoprazole (PROTONIX) 40 MG DR tablet         sertraline (ZOLOFT) 100 MG tablet         tiotropium (SPIRIVA) 18 mcg inhalation capsule INHALE THE CONTENTS OF 1 CAPSULE VIA INHALATION DEVICE EVERY DAY AS DIRECTED        No current facility-administered medications on file prior to visit.      Family History       Family History  Problem Relation Age of Onset   Obesity Sister     High blood pressure (Hypertension) Sister     Hyperlipidemia (Elevated cholesterol) Sister     Coronary Artery Disease (Blocked arteries around heart) Sister     Diabetes Sister          Social History        Tobacco Use  Smoking Status Former   Types: Cigarettes  Smokeless Tobacco Never      Social History  Social History         Socioeconomic History   Marital status: Married  Tobacco Use   Smoking status: Former      Types: Cigarettes   Smokeless tobacco: Never  Vaping Use   Vaping Use: Never used  Substance and Sexual Activity   Alcohol use: Not Currently   Drug use: Never        Objective:          Vitals:    04/14/22 1109 04/14/22 1112  BP: 132/64    Pulse: 65    Temp: 36.4 C (97.6 F)    SpO2: 92%    PainSc:   0-No pain        Physical Exam    Constitutional:  WDWN in NAD, slowly conversant, no obvious deformities; wheelchair-bound Eyes:  Pupils equal, round; sclera anicteric; moist conjunctiva; no lid lag HENT:  Oral mucosa moist; good dentition  Neck:  No masses palpated, trachea midline; no thyromegaly Lungs:  CTA bilaterally; normal respiratory effort Breasts: Left breast is status postmastectomy.  There is evidence of reconstruction with a small breast mound.  No palpable masses or axillary lymphadenopathy on the left Right breast shows some obvious nipple retraction.  There is firm  palpable mass in the retroareolar space and upper breast.  No palpable lymphadenopathy in the axilla. CV:  Regular rate and rhythm; no murmurs; extremities well-perfused with no edema Abd:  +bowel sounds, soft, non-tender, no palpable organomegaly; no palpable hernias Musc:  Unable to assess gait; no apparent clubbing or cyanosis in extremities Lymphatic:  No palpable cervical or axillary lymphadenopathy Skin:  Warm, dry; no sign of jaundice Psychiatric - alert and oriented x 4; calm mood and affect     Labs, Imaging and Diagnostic Testing: Narrative & Impression  CLINICAL DATA:  77 year old female with history of left breast mastectomy in 2005 for Paget's disease. She has had a remote reduction of the right breast. She has recent history of right breast intermittent pain and nipple inversion. The patient has had strokes which affects her right side and is in a wheelchair and her mobility. Only a right CC mammographic image was able to be obtained. She has prior films from [place], however her husband does no at what facility.   EXAM: DIGITAL DIAGNOSTIC UNILATERAL RIGHT MAMMOGRAM WITH TOMOSYNTHESIS; ULTRASOUND RIGHT BREAST LIMITED   TECHNIQUE: Right digital diagnostic mammography and breast tomosynthesis was performed.; Targeted ultrasound examination of the right breast was performed   COMPARISON:  None available.   ACR Breast Density Category b: There are scattered areas of fibroglandular density.   FINDINGS: The posterior most tissues in the right breast could not be included within the field of view due to the patient's limited mobility. There is a small asymmetry in the medial retroareolar right breast measuring approximately 1 cm. The nipple does appear to be inverted. No other suspicious calcifications, masses or areas of distortion are seen in the right breast.   Ultrasound targeted to the right breast at 1 o'clock, 1 cm from the nipple demonstrates an irregular  hypoechoic mass with indistinct margins measuring 1.0 x 1.0 x 0.7 cm. There is hypoechoic shadowing tissue immediately deep to the nipple image at 9 o'clock in the retroareolar location spanning approximately 3.4 x 1.4 x 2.2 cm. Imaging of the right axilla is limited due to the patient's mobility limitations. Visualized lymph nodes are normal.   IMPRESSION: 1. There is an indeterminate 1.0 cm mass in the right breast at 1 o'clock.   2. There is indeterminate 3.4 cm area of shadowing tissue in the retroareolar right breast at 9 o'clock.   3. Limited evaluation of the axilla demonstrates normal lymph nodes.   RECOMMENDATION: Ultrasound guided biopsy is recommended for the right breast mass at 1 o'clock and the shadowing tissue in the retroareolar right breast at 9 o'clock. The procedures have been scheduled for 04/05/2022 at 1:45 p.m.   I have discussed the findings and recommendations with the patient. If applicable, a reminder letter will be sent to the patient regarding the next appointment.   BI-RADS CATEGORY  4: Suspicious.     Electronically Signed   By: Ammie Ferrier M.D.   On: 03/31/2022 15:52      Assessment and Plan:  Diagnoses and all orders for this visit:   Invasive lobular carcinoma of right breast in female (CMS-HCC)   The patient has 2 separate areas of invasive lobular carcinoma in the right breast.  These are not immediately adjacent.  The mass located at 9:00 is fairly large.  The patient is not a good candidate for neoadjuvant chemotherapy due to her multiple medical comorbidities.  We recommend right mastectomy with sentinel lymph node biopsy.  We will first obtain cardiac clearance from her cardiologist.   The surgical procedure has been discussed with the patient.  Potential risks, benefits, alternative treatments, and expected outcomes have been explained.  All of the patient's questions at this time have been answered.  The likelihood of  reaching  the patient's treatment goal is good.  The patient understand the proposed surgical procedure and wishes to proceed.   I will also discuss her case with plastic surgery.  It is not clear whether the tissue expander needs to be removed.  If so, we will try to accomplish this at the same time as her mastectomy.   The patient will also be referred to oncology for discussion of adjuvant treatment.   No follow-ups on file.   Carlean Jews, MD  04/14/2022 12:09 PM

## 2022-04-14 NOTE — Telephone Encounter (Signed)
Pt has appt 04/21/22 with Dr. Marlou Porch. I will update the appt notes to reflect need pre op clearance.

## 2022-04-14 NOTE — Telephone Encounter (Signed)
   Name: Brandy Paul  DOB: 1945-05-06  MRN: [id number]  Primary Cardiologist: Sinclair Grooms, MD  Chart reviewed as part of pre-operative protocol coverage. Because of Valerye Kobus Wandrey's past medical history and time since last visit, she will require a follow-up in-office visit in order to better assess preoperative cardiovascular risk.  Pre-op covering staff: - Please schedule appointment and call patient to inform them. If patient already had an upcoming appointment within acceptable timeframe, please add "pre-op clearance" to the appointment notes so provider is aware. - Please contact requesting surgeon's office via preferred method (i.e, phone, fax) to inform them of need for appointment prior to surgery.  She has not been seen by [place] in 2 years. It appears she is on Plavix for CVA. I would defer to PCP/neuro for holding parameters.   Elgie Collard, PA-C  04/14/2022, 12:49 PM

## 2022-04-17 ENCOUNTER — Telehealth: Payer: Self-pay | Admitting: Hematology and Oncology

## 2022-04-17 NOTE — Telephone Encounter (Signed)
Scheduled appt per 1/29 referral. Pt is aware of appt date and time. Pt is aware to arrive 15 mins prior to appt time and to bring and updated insurance card. Pt is aware of appt location.

## 2022-04-18 ENCOUNTER — Other Ambulatory Visit: Payer: Self-pay | Admitting: *Deleted

## 2022-04-18 DIAGNOSIS — C50911 Malignant neoplasm of unspecified site of right female breast: Secondary | ICD-10-CM

## 2022-04-21 ENCOUNTER — Ambulatory Visit: Payer: Medicare Other | Attending: Cardiology | Admitting: Cardiology

## 2022-04-21 ENCOUNTER — Encounter: Payer: Self-pay | Admitting: Cardiology

## 2022-04-21 ENCOUNTER — Encounter: Payer: Self-pay | Admitting: *Deleted

## 2022-04-21 VITALS — BP 130/70 | HR 102 | Resp 97 | Ht 63.0 in | Wt 208.8 lb

## 2022-04-21 DIAGNOSIS — E7849 Other hyperlipidemia: Secondary | ICD-10-CM

## 2022-04-21 DIAGNOSIS — I201 Angina pectoris with documented spasm: Secondary | ICD-10-CM | POA: Diagnosis not present

## 2022-04-21 DIAGNOSIS — Z01818 Encounter for other preprocedural examination: Secondary | ICD-10-CM | POA: Diagnosis not present

## 2022-04-21 NOTE — Patient Instructions (Signed)
Medication Instructions:  The current medical regimen is effective;  continue present plan and medications.  *If you need a refill on your cardiac medications before your next appointment, please call your pharmacy*   Testing/Procedures: Your physician has requested that you have a lexiscan myoview. For further information please visit [email or web address]. Please follow instruction sheet, as given.   Follow-Up: At [facility], you and your health needs are our priority.  As part of our continuing mission to provide you with exceptional heart care, we have created designated Provider Care Teams.  These Care Teams include your primary Cardiologist (physician) and Advanced Practice Providers (APPs -  Physician Assistants and Nurse Practitioners) who all work together to provide you with the care you need, when you need it.  We recommend signing up for the patient portal called "MyChart".  Sign up information is provided on this After Visit Summary.  MyChart is used to connect with patients for Virtual Visits (Telemedicine).  Patients are able to view lab/test results, encounter notes, upcoming appointments, etc.  Non-urgent messages can be sent to your provider as well.   To learn more about what you can do with MyChart, go to [email or web address].    Your next appointment:   1 year(s)  Provider:    Dr Candee Furbish

## 2022-04-21 NOTE — Progress Notes (Signed)
Cardiology Office Note:    Date:  04/21/2022   ID:  Brandy, Paul 02/03/1946, MRN [id number]  PCP:  Velna Hatchet, MD   [place] Providers Cardiologist:  Candee Furbish, MD     Referring MD: Velna Hatchet, MD    History of Present Illness:    Brandy Paul is a 77 y.o. female here for the follow-up of prior stroke with right-sided with residual weakness coronary artery spasm obesity diabetes COPD GERD with prior normal nuclear perfusion study in 2017.  Review of Dr. Thompson Caul prior note.  Former patient of Dr. Mallie Mussel Smith's.  Stroke occurred in 2013.  Today she is here sitting in her wheelchair, husband at her side.  Has been diagnosed with breast cancer.  Dr. Georgette Dover has seen her and plans on mastectomy.  She is unable to complete greater than 4 METS of activity.  Past Medical History:  Diagnosis Date   Adenomatous colon polyp    Breast cancer ([place]) 2005   left   COPD (chronic obstructive pulmonary disease) (HCC)    hx of tobacco use   Coronary artery spasm (HCC)    Depression    GERD (gastroesophageal reflux disease)    Hyperlipemia    IDA (iron deficiency anemia)    last iron infusion 4 months ago   Obesity    Osteoporosis    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 ([place])    Prediabetes    hx of   Rash    under right breast and groin line using nizoral ointment to q day per dermatology saw few weeks ago   Stress incontinence wers depends   Stroke [facility]) 2013   paralytic syndrome of dominant right side   Toe fracture, left     Past Surgical History:  Procedure Laterality Date   ABDOMINAL HYSTERECTOMY  1989   complete   BIOPSY  11/11/2019   Procedure: BIOPSY;  Surgeon: Doran Stabler, MD;  Location: [facility];  Service: Gastroenterology;;   BREAST BIOPSY Right 04/10/2022   [place] RT BREAST BX W LOC DEV EA ADD LESION IMG BX SPEC US GUIDE 04/10/2022 [facility]   BREAST BIOPSY Right 04/10/2022   [place] RT BREAST BX W LOC DEV 1ST LESION IMG BX Portis US  GUIDE 04/10/2022 [facility]   CHOLECYSTECTOMY N/A 12/10/2017   Procedure: LAPAROSCOPIC CHOLECYSTECTOMY WITH INTRAOPERATIVE  ERAS PATHWAY;  Surgeon: Johnathan Hausen, MD;  Location: [facility];  Service: General;  Laterality: N/A;   COLONOSCOPY WITH PROPOFOL N/A 12/03/2015   Procedure: COLONOSCOPY WITH PROPOFOL;  Surgeon: Mauri Pole, MD;  Location: [facility];  Service: Endoscopy;  Laterality: N/A;  tba before procdcedure   COLONOSCOPY WITH PROPOFOL N/A 11/11/2019   Procedure: COLONOSCOPY WITH PROPOFOL;  Surgeon: Doran Stabler, MD;  Location: [facility];  Service: Gastroenterology;  Laterality: N/A;   ENTEROSCOPY N/A 11/11/2019   Procedure: ENTEROSCOPY;  Surgeon: Doran Stabler, MD;  Location: [facility];  Service: Gastroenterology;  Laterality: N/A;   IR CHOLANGIOGRAM EXISTING TUBE  08/08/2017   IR CHOLANGIOGRAM EXISTING TUBE  08/27/2017   IR PERC CHOLECYSTOSTOMY  07/13/2017   LAPAROSCOPIC APPENDECTOMY N/A 12/10/2017   Procedure: APPENDECTOMY LAPAROSCOPIC;  Surgeon: Johnathan Hausen, MD;  Location: [facility];  Service: General;  Laterality: N/A;   MASTECTOMY Left 2005   1 lymph node removed   RADIAL KERATOTOMY Bilateral    ROBOTIC ASSISTED BILATERAL SALPINGO OOPHERECTOMY Bilateral 11/04/2015   Procedure: XI ROBOTIC ASSISTED BILATERAL SALPINGO OOPHORECTOMY;  Surgeon: Terrence Dupont  Denman George, MD;  Location: [facility];  Service: Gynecology;  Laterality: Bilateral;   TONSILLECTOMY      Current Medications: Current Meds  Medication Sig   acetaminophen (TYLENOL) 500 MG tablet Take 500 mg by mouth daily as needed for mild pain.    albuterol (VENTOLIN HFA) 108 (90 Base) MCG/ACT inhaler Inhale 1-2 puffs into the lungs every 6 (six) hours as needed for wheezing or shortness of breath.   atorvastatin (LIPITOR) 40 MG tablet Take 40 mg by mouth at bedtime.    buPROPion (WELLBUTRIN SR) 150 MG 12 hr tablet Take 150 mg by mouth daily.   calcium carbonate (OSCAL) 1500 (600 Ca) MG TABS tablet Take 600 mg of  elemental calcium by mouth daily.   clopidogrel (PLAVIX) 75 MG tablet Take 75 mg by mouth daily.   ezetimibe (ZETIA) 10 MG tablet Take 10 mg by mouth at bedtime.    ferrous sulfate 325 (65 FE) MG tablet Take 1 tablet (325 mg total) by mouth daily with breakfast.   isosorbide mononitrate (IMDUR) 60 MG 24 hr tablet Take 1 tablet (60 mg total) by mouth daily. Please make overdue appt with Dr. Tamala Julian before anymore refills. Thank you  3rd and final attempt   nitroGLYCERIN (NITROSTAT) 0.4 MG SL tablet Place 1 tablet (0.4 mg total) under the tongue every 5 (five) minutes as needed for chest pain (Call 911 at 3rd dose within 15 minutes).   pantoprazole (PROTONIX) 40 MG tablet Take 1 tablet (40 mg total) by mouth daily.   sertraline (ZOLOFT) 100 MG tablet Take 100 mg by mouth at bedtime.    tiotropium (SPIRIVA) 18 MCG inhalation capsule Place 18 mcg into inhaler and inhale every evening.   trimethoprim (TRIMPEX) 100 MG tablet Take 100 mg by mouth daily.   vitamin B-12 1000 MCG tablet Take 1 tablet (1,000 mcg total) by mouth daily.     Allergies:   Ceclor [cefaclor]   Social History   Socioeconomic History   Marital status: Married    Spouse name: [name]   Number of children: 4   Years of education: 21   Highest education level: Not on file  Occupational History   Occupation: retired  Tobacco Use   Smoking status: Former    Types: Cigarettes    Quit date: 02/17/1994    Years since quitting: 28.1   Smokeless tobacco: Never  Vaping Use   Vaping Use: Never used  Substance and Sexual Activity   Alcohol use: No    Alcohol/week: 0.0 standard drinks of alcohol   Drug use: No   Sexual activity: Not Currently  Other Topics Concern   Not on file  Social History Narrative   Lives with spouse   1-2 cups coffee   Social Determinants of Health   Financial Resource Strain: Not on file  Food Insecurity: Not on file  Transportation Needs: Not on file  Physical Activity: Not on file  Stress: Not  on file  Social Connections: Not on file     Family History: The patient's family history includes Arthritis in her sister; Breast cancer in her sister; CVA in her mother; Colon polyps in her sister; Diabetes in her sister; Heart attack in her sister; Hypertension in her sister; Liver cancer in her mother; Prostate cancer in her father; Stroke in her maternal grandfather. There is no history of Colon cancer, Esophageal cancer, or Rectal cancer.  ROS:   Please see the history of present illness.     All other systems  reviewed and are negative.  EKGs/Labs/Other Studies Reviewed:    The following studies were reviewed today: As above  EKG:  The ekg ordered today demonstrates  04/21/2022-sinus rhythm 103 with frequent premature complexes, nonspecific T wave flattening.  Recent Labs: No results found for requested labs within last 365 days.  Recent Lipid Panel    Component Value Date/Time   CHOL 139 09/18/2017 0533   TRIG 150 (H) 09/18/2017 0533   HDL 34 (L) 09/18/2017 0533   CHOLHDL 4.1 09/18/2017 0533   VLDL 30 09/18/2017 0533   LDLCALC 75 09/18/2017 0533     Risk Assessment/Calculations:               Physical Exam:    VS:  BP 130/70   Pulse (!) 102   Resp (!) 97   Ht '5\' 3"'$  (1.6 m)   Wt 208 lb 12.8 oz (94.7 kg)   BMI 36.99 kg/m     Wt Readings from Last 3 Encounters:  04/21/22 208 lb 12.8 oz (94.7 kg)  09/30/20 191 lb 9.3 oz (86.9 kg)  04/21/20 194 lb 9.6 oz (88.3 kg)     GEN:  Well nourished, well developed in no acute distress HEENT: Normal NECK: No JVD; No carotid bruits LYMPHATICS: No lymphadenopathy CARDIAC: RRR, no murmurs, rubs, gallops RESPIRATORY:  Clear to auscultation without rales, wheezing or rhonchi  ABDOMEN: Soft, non-tender, non-distended MUSCULOSKELETAL:  No edema; No deformity  SKIN: Warm and dry NEUROLOGIC:  Alert and oriented x 3 PSYCHIATRIC:  Normal affect   ASSESSMENT:    1. Pre-op evaluation   2. Coronary artery spasm (HCC)    3. Other hyperlipidemia    PLAN:    In order of problems listed above:  Pre op cardiology evaluation -Given her inability to produce greater than 4 METS of activity, I would like to proceed with Lexiscan stress test.  If overall low risk she may proceed with lumpectomy.  Dr. Georgette Dover. -Plavix hold for 5 days prior to surgery.  Coronary artery spasm with prior history of TIA and stroke Continue with secondary prevention adequately controlled lipids hemoglobin A1c..  On isosorbide  Hyperlipidemia Continuing Zetia as well as atorvastatin 40.  Prior TIA stroke On Plavix.       Shared Decision Making/Informed Consent The risks [chest pain, shortness of breath, cardiac arrhythmias, dizziness, blood pressure fluctuations, myocardial infarction, stroke/transient ischemic attack, nausea, vomiting, allergic reaction, radiation exposure, metallic taste sensation and life-threatening complications (estimated to be 1 in 10,000)], benefits (risk stratification, diagnosing coronary artery disease, treatment guidance) and alternatives of a nuclear stress test were discussed in detail with Brandy Paul and she agrees to proceed.    Medication Adjustments/Labs and Tests Ordered: Current medicines are reviewed at length with the patient today.  Concerns regarding medicines are outlined above.  Orders Placed This Encounter  Procedures   MYOCARDIAL PERFUSION IMAGING   EKG 12-Lead   No orders of the defined types were placed in this encounter.   Patient Instructions  Medication Instructions:  The current medical regimen is effective;  continue present plan and medications.  *If you need a refill on your cardiac medications before your next appointment, please call your pharmacy*   Testing/Procedures: Your physician has requested that you have a lexiscan myoview. For further information please visit [email or web address]. Please follow instruction sheet, as given.   Follow-Up: At [facility], you and your health needs are our priority.  As part of our continuing mission to provide you with exceptional  heart care, we have created designated Provider Care Teams.  These Care Teams include your primary Cardiologist (physician) and Advanced Practice Providers (APPs -  Physician Assistants and Nurse Practitioners) who all work together to provide you with the care you need, when you need it.  We recommend signing up for the patient portal called "MyChart".  Sign up information is provided on this After Visit Summary.  MyChart is used to connect with patients for Virtual Visits (Telemedicine).  Patients are able to view lab/test results, encounter notes, upcoming appointments, etc.  Non-urgent messages can be sent to your provider as well.   To learn more about what you can do with MyChart, go to [email or web address].    Your next appointment:   1 year(s)  Provider:    Dr Candee Furbish        Signed, Candee Furbish, MD  04/21/2022 2:27 PM    [place]

## 2022-04-24 ENCOUNTER — Other Ambulatory Visit: Payer: Self-pay | Admitting: Licensed Clinical Social Worker

## 2022-04-24 ENCOUNTER — Inpatient Hospital Stay: Payer: Medicare Other | Attending: Nurse Practitioner | Admitting: Licensed Clinical Social Worker

## 2022-04-24 ENCOUNTER — Inpatient Hospital Stay: Payer: Medicare Other

## 2022-04-24 ENCOUNTER — Encounter: Payer: Self-pay | Admitting: Licensed Clinical Social Worker

## 2022-04-24 ENCOUNTER — Other Ambulatory Visit: Payer: Self-pay

## 2022-04-24 DIAGNOSIS — J449 Chronic obstructive pulmonary disease, unspecified: Secondary | ICD-10-CM | POA: Insufficient documentation

## 2022-04-24 DIAGNOSIS — Z8673 Personal history of transient ischemic attack (TIA), and cerebral infarction without residual deficits: Secondary | ICD-10-CM | POA: Insufficient documentation

## 2022-04-24 DIAGNOSIS — Z170 Estrogen receptor positive status [ER+]: Secondary | ICD-10-CM | POA: Insufficient documentation

## 2022-04-24 DIAGNOSIS — Z8042 Family history of malignant neoplasm of prostate: Secondary | ICD-10-CM

## 2022-04-24 DIAGNOSIS — I2510 Atherosclerotic heart disease of native coronary artery without angina pectoris: Secondary | ICD-10-CM | POA: Insufficient documentation

## 2022-04-24 DIAGNOSIS — Z87891 Personal history of nicotine dependence: Secondary | ICD-10-CM | POA: Insufficient documentation

## 2022-04-24 DIAGNOSIS — C50111 Malignant neoplasm of central portion of right female breast: Secondary | ICD-10-CM | POA: Insufficient documentation

## 2022-04-24 DIAGNOSIS — Z993 Dependence on wheelchair: Secondary | ICD-10-CM | POA: Insufficient documentation

## 2022-04-24 DIAGNOSIS — I219 Acute myocardial infarction, unspecified: Secondary | ICD-10-CM | POA: Insufficient documentation

## 2022-04-24 DIAGNOSIS — Z853 Personal history of malignant neoplasm of breast: Secondary | ICD-10-CM | POA: Insufficient documentation

## 2022-04-24 DIAGNOSIS — C50911 Malignant neoplasm of unspecified site of right female breast: Secondary | ICD-10-CM

## 2022-04-24 DIAGNOSIS — Z9012 Acquired absence of left breast and nipple: Secondary | ICD-10-CM | POA: Insufficient documentation

## 2022-04-24 LAB — GENETIC SCREENING ORDER

## 2022-04-24 NOTE — Progress Notes (Signed)
REFERRING PROVIDER: Benay Pike, MD [place],  [place] [address]  PRIMARY PROVIDER:  Velna Hatchet, MD  PRIMARY REASON FOR VISIT:  1. Malignant neoplasm of right female breast, unspecified estrogen receptor status, unspecified site of breast ([place])   2. Family history of prostate cancer     HISTORY OF PRESENT ILLNESS:   Brandy Paul, a 77 y.o. female, was seen for a [place] cancer genetics consultation at the request of Dr. Chryl Heck due to a personal and family history of cancer.  Brandy Paul presents to clinic today to discuss the possibility of a hereditary predisposition to cancer, genetic testing, and to further clarify her future cancer risks, as well as potential cancer risks for family members.    CANCER HISTORY:  In 2024, at the age of 11, Brandy Paul was diagnosed with invasive lobular carcinoma of the right breast.  The treatment plan is still being determined. 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   Past Medical History:  Diagnosis Date   Adenomatous colon polyp    Breast cancer ([place]) 2005   left   COPD (chronic obstructive pulmonary disease) (HCC)    hx of tobacco use   Coronary artery spasm (HCC)    Depression    GERD (gastroesophageal reflux disease)    Hyperlipemia    IDA (iron deficiency anemia)    last iron infusion 4 months ago   Obesity    Osteoporosis    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 ([place])    Prediabetes    hx of   Rash    under right breast and groin line using nizoral ointment to q day per dermatology saw few weeks ago   Stress incontinence wers depends   Stroke [facility]) 2013   paralytic syndrome of dominant right side   Toe fracture, left     Past Surgical History:  Procedure Laterality Date   ABDOMINAL HYSTERECTOMY  1989   complete   BIOPSY  11/11/2019   Procedure: BIOPSY;  Surgeon: Doran Stabler, MD;  [facility];  Service: Gastroenterology;;    BREAST BIOPSY Right 04/10/2022   [place] RT BREAST BX W LOC DEV EA ADD LESION IMG BX SPEC US GUIDE 04/10/2022 [facility]   BREAST BIOPSY Right 04/10/2022   [place] RT BREAST BX W LOC DEV 1ST LESION IMG BX [place] US GUIDE 04/10/2022 [facility]   CHOLECYSTECTOMY N/A 12/10/2017   Procedure: LAPAROSCOPIC CHOLECYSTECTOMY WITH INTRAOPERATIVE  ERAS PATHWAY;  Surgeon: Johnathan Hausen, MD;  [facility];  Service: General;  Laterality: N/A;   COLONOSCOPY WITH PROPOFOL N/A 12/03/2015   Procedure: COLONOSCOPY WITH PROPOFOL;  Surgeon: Mauri Pole, MD;  [facility];  Service: Endoscopy;  Laterality: N/A;  tba before procdcedure   COLONOSCOPY WITH PROPOFOL N/A 11/11/2019   Procedure: COLONOSCOPY WITH PROPOFOL;  Surgeon: Doran Stabler, MD;  [facility];  Service: Gastroenterology;  Laterality: N/A;   ENTEROSCOPY N/A 11/11/2019   Procedure: ENTEROSCOPY;  Surgeon: Doran Stabler, MD;  [facility];  Service: Gastroenterology;  Laterality: N/A;   IR CHOLANGIOGRAM EXISTING TUBE  08/08/2017   IR CHOLANGIOGRAM EXISTING TUBE  08/27/2017   IR PERC CHOLECYSTOSTOMY  07/13/2017   LAPAROSCOPIC APPENDECTOMY N/A 12/10/2017   Procedure: APPENDECTOMY LAPAROSCOPIC;  Surgeon: Johnathan Hausen, MD;  [facility];  Service: General;  Laterality: N/A;   MASTECTOMY Left 2005   1 lymph  node removed   RADIAL KERATOTOMY Bilateral    ROBOTIC ASSISTED BILATERAL SALPINGO OOPHERECTOMY Bilateral 11/04/2015   Procedure: XI ROBOTIC ASSISTED BILATERAL SALPINGO OOPHORECTOMY;  Surgeon: Everitt Amber, MD;  [facility];  Service: Gynecology;  Laterality: Bilateral;   TONSILLECTOMY      FAMILY HISTORY:  We obtained a detailed, 4-generation family history.  Significant diagnoses are listed below: Family History  Problem Relation Age of Onset   CVA Mother    Liver cancer Mother    Prostate cancer Father    Stroke Maternal Grandfather    Hypertension Sister    Arthritis Sister     Diabetes Sister    Heart attack Sister    Breast cancer Sister    Colon polyps Sister    Colon cancer Neg Hx    Esophageal cancer Neg Hx    Rectal cancer Neg Hx    Brandy Paul has 1 son, 60 and 1 daughter, 79. Her son was diagnosed with stage IV skin cancer, either basal cell or squamous cell carcinoma, patient not sure which. Brandy Paul also had 2 sisters, both passed, no cancers. She has 1 brother who currently has cancer in his 87s, unsure type.  Brandy Paul has limited information about her maternal/paternal relatives. Her mother died of liver cancer at 26. Her father died of metastatic prostate cancer in his 43s. No other known relatives with cancer.  Brandy Paul is unaware of previous family history of genetic testing for hereditary cancer risks. There is no reported Ashkenazi Jewish ancestry. There is no known consanguinity.    GENETIC COUNSELING ASSESSMENT: Brandy Paul is a 77 y.o. female with a personal and family history of breast cancer which is somewhat suggestive of a hereditary cancer syndrome and predisposition to cancer. We, therefore, discussed and recommended the following at today's visit.   DISCUSSION: We discussed that approximately 10% of breast cancer is hereditary. Most cases of hereditary breast cancer are associated with BRCA1/BRCA2 genes, although there are other genes associated with hereditary cancer as well. Cancers and risks are gene specific. We discussed that testing is beneficial for several reasons including knowing about cancer risks, identifying potential screening and risk-reduction options that may be appropriate, and to understand if other family members could be at risk for cancer and allow them to undergo genetic testing.   We reviewed the characteristics, features and inheritance patterns of hereditary cancer syndromes. We also discussed genetic testing, including the appropriate family members to test, the process of testing, insurance coverage and  turn-around-time for results. We discussed the implications of a negative, positive and/or variant of uncertain significant result. We recommended Brandy Paul pursue genetic testing for the [facility] Multi-Cancer+RNA gene panel.   Based on Brandy Paul's personal and family history of cancer, she meets medical criteria for genetic testing. Despite that she meets criteria, she may still have an out of pocket cost. We discussed that if her out of pocket cost for testing is over $100, the laboratory will call and confirm whether she wants to proceed with testing.  If the out of pocket cost of testing is less than $100 she will be billed by the genetic testing laboratory.   PLAN: After considering the risks, benefits, and limitations, Brandy Paul provided informed consent to pursue genetic testing and the blood sample was sent to [facility] for analysis of the Multi-Cancer+RNA panel. Results should be available within approximately 2-3 weeks' time, at which point they will be disclosed by telephone to Brandy Paul, as  will any additional recommendations warranted by these results. Brandy Paul will receive a summary of her genetic counseling visit and a copy of her results once available. This information will also be available in Epic.   Brandy Paul questions were answered to her satisfaction today. Our contact information was provided should additional questions or concerns arise. Thank you for the referral and allowing [place] to share in the care of your patient.   Faith Rogue, MS, [facility] Genetic Counselor [email or web address] Phone: [phone]  The patient was seen for a total of 25 minutes in face-to-face genetic counseling.  Patient's husband was also present. Dr. Grayland Ormond was available for discussion regarding this case.   _______________________________________________________________________ For Office Staff:  Number of people involved in session: 2 Was an Intern/ student involved  with case: no

## 2022-04-27 ENCOUNTER — Other Ambulatory Visit: Payer: Self-pay

## 2022-04-27 ENCOUNTER — Inpatient Hospital Stay: Payer: Medicare Other | Admitting: Hematology and Oncology

## 2022-04-27 ENCOUNTER — Encounter: Payer: Self-pay | Admitting: Hematology and Oncology

## 2022-04-27 ENCOUNTER — Inpatient Hospital Stay: Payer: Medicare Other

## 2022-04-27 ENCOUNTER — Telehealth (HOSPITAL_COMMUNITY): Payer: Self-pay | Admitting: *Deleted

## 2022-04-27 VITALS — BP 148/58 | HR 91 | Temp 97.7°F | Resp 16 | Ht 63.0 in | Wt 205.7 lb

## 2022-04-27 DIAGNOSIS — Z9012 Acquired absence of left breast and nipple: Secondary | ICD-10-CM | POA: Diagnosis not present

## 2022-04-27 DIAGNOSIS — C50111 Malignant neoplasm of central portion of right female breast: Secondary | ICD-10-CM

## 2022-04-27 DIAGNOSIS — Z170 Estrogen receptor positive status [ER+]: Secondary | ICD-10-CM

## 2022-04-27 DIAGNOSIS — I219 Acute myocardial infarction, unspecified: Secondary | ICD-10-CM | POA: Diagnosis not present

## 2022-04-27 DIAGNOSIS — Z87891 Personal history of nicotine dependence: Secondary | ICD-10-CM

## 2022-04-27 DIAGNOSIS — Z993 Dependence on wheelchair: Secondary | ICD-10-CM | POA: Diagnosis not present

## 2022-04-27 DIAGNOSIS — J449 Chronic obstructive pulmonary disease, unspecified: Secondary | ICD-10-CM | POA: Diagnosis not present

## 2022-04-27 DIAGNOSIS — Z8673 Personal history of transient ischemic attack (TIA), and cerebral infarction without residual deficits: Secondary | ICD-10-CM | POA: Diagnosis not present

## 2022-04-27 DIAGNOSIS — I2510 Atherosclerotic heart disease of native coronary artery without angina pectoris: Secondary | ICD-10-CM | POA: Diagnosis not present

## 2022-04-27 DIAGNOSIS — Z853 Personal history of malignant neoplasm of breast: Secondary | ICD-10-CM | POA: Diagnosis not present

## 2022-04-27 NOTE — Progress Notes (Signed)
[facility] CONSULT NOTE  Patient Care Team: Velna Hatchet, MD as PCP - General (Internal Medicine) Jerline Pain, MD as PCP - Cardiology (Cardiology)  CHIEF COMPLAINTS/PURPOSE OF CONSULTATION:  Newly diagnosed breast cancer  HISTORY OF PRESENTING ILLNESS:  Brandy Paul 77 y.o. female is here because of recent diagnosis of right breast cancer  I reviewed her records extensively and collaborated the history with the patient.  SUMMARY OF ONCOLOGIC HISTORY: Oncology History  Malignant neoplasm of central portion of right breast in female, estrogen receptor positive ([place])  03/31/2022 Mammogram   Mammogram right breast showed an indeterminate 1 cm mass in the right breast at 1:00.  There is also an indeterminate 3.4 cm area of shadowing tissue in the retroareolar right breast at 9:00.  Limited evaluation of the axilla demonstrates normal lymph nodes. US of the breast confirmed same findings.   04/10/2022 Pathology Results   Right breast needle core biopsy at 1:00 showed invasive lobular carcinoma, grade 2 prognostic showed ER 100% positive strong staining PR 85% positive strong staining Ki-67 of 40% and group 5 HER2 negative.  The retroareolar biopsy also confirmed invasive lobular carcinoma grade 1 which is ER 100% positive strong staining PR 0% negative Ki-67 of 40% and HER2 negative   04/27/2022 Initial Diagnosis   Malignant neoplasm of central portion of right breast in female, estrogen receptor positive ([place])    [name] arrived to the appointment today with her husband.  She has multiple medical comorbidities, wheelchair-bound from a stroke many years ago, has coronary artery disease, multiple heart attacks, chronic obstructive pulmonary disease, iron deficiency anemia, Paget's disease of the left female breast for which she underwent mastectomy.  She was seen by Dr. Georgette Dover from breast surgery and recommendation was to consider mastectomy however pending cardiac  clearance.  Mr. Hillenburg has noticed that her right nipple was getting retracted which prompted him to take her to the doctor for additional investigation.  He was worried that she is now having Paget's disease on the other side as well.  She otherwise denies any major changes in her health.  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 ([place])    Prediabetes    hx of   Rash    under right breast and groin line using nizoral ointment to q day per dermatology saw few weeks ago   Stress incontinence wers depends   Stroke [facility]) 2013   paralytic syndrome of dominant right side   Toe fracture, left     SURGICAL HISTORY: Past Surgical History:  Procedure Laterality Date   ABDOMINAL HYSTERECTOMY  1989   complete   BIOPSY  11/11/2019   Procedure: BIOPSY;  Surgeon: Doran Stabler, MD;  [facility];  Service: Gastroenterology;;   BREAST BIOPSY Right 04/10/2022   [place] RT BREAST BX W LOC DEV EA ADD LESION IMG BX SPEC US GUIDE 04/10/2022 [facility]   BREAST BIOPSY Right  04/10/2022   [place] RT BREAST BX W LOC DEV 1ST LESION IMG BX SPEC US GUIDE 04/10/2022 [facility]   CHOLECYSTECTOMY N/A 12/10/2017   Procedure: LAPAROSCOPIC CHOLECYSTECTOMY WITH INTRAOPERATIVE  ERAS PATHWAY;  Surgeon: Johnathan Hausen, MD;  [facility];  Service: General;  Laterality: N/A;   COLONOSCOPY WITH PROPOFOL N/A 12/03/2015   Procedure: COLONOSCOPY WITH PROPOFOL;  Surgeon: Mauri Pole, MD;  [facility];  Service: Endoscopy;  Laterality: N/A;  tba  before procdcedure   COLONOSCOPY WITH PROPOFOL N/A 11/11/2019   Procedure: COLONOSCOPY WITH PROPOFOL;  Surgeon: Doran Stabler, MD;  [facility];  Service: Gastroenterology;  Laterality: N/A;   ENTEROSCOPY N/A 11/11/2019   Procedure: ENTEROSCOPY;  Surgeon: Doran Stabler, MD;  [facility];  Service: Gastroenterology;  Laterality: N/A;   IR CHOLANGIOGRAM EXISTING TUBE  08/08/2017   IR CHOLANGIOGRAM EXISTING TUBE  08/27/2017   IR PERC CHOLECYSTOSTOMY  07/13/2017   LAPAROSCOPIC APPENDECTOMY N/A 12/10/2017   Procedure: APPENDECTOMY LAPAROSCOPIC;  Surgeon: Johnathan Hausen, MD;  [facility];  Service: General;  Laterality: N/A;   MASTECTOMY Left 2005   1 lymph node removed   RADIAL KERATOTOMY Bilateral    ROBOTIC ASSISTED BILATERAL SALPINGO OOPHERECTOMY Bilateral 11/04/2015   Procedure: XI ROBOTIC ASSISTED BILATERAL SALPINGO OOPHORECTOMY;  Surgeon: Everitt Amber, MD;  [facility];  Service: Gynecology;  Laterality: Bilateral;   TONSILLECTOMY      SOCIAL HISTORY: Social History   Socioeconomic History   Marital status: Married    Spouse name: [name]   Number of children: 4   Years of education: 21   Highest education level: Not on file  Occupational History   Occupation: retired  Tobacco Use   Smoking status: Former    Types: Cigarettes    Quit date: 02/17/1994    Years since quitting: 28.2   Smokeless tobacco: Never  Vaping Use   Vaping Use: Never used  Substance and Sexual Activity   Alcohol use: No    Alcohol/week: 0.0 standard drinks of alcohol   Drug use: No   Sexual activity: Not Currently  Other Topics Concern   Not on file  Social History Narrative   Lives with spouse   1-2 cups coffee   Social Determinants of Health   Financial Resource Strain: Not on file  Food Insecurity: Not on file  Transportation Needs: Not on file  Physical Activity: Not on file  Stress: Not on file  Social Connections: Not on file  Intimate Partner Violence:  Not on file    FAMILY HISTORY: Family History  Problem Relation Age of Onset   CVA Mother    Liver cancer Mother        d. 26   Prostate cancer Father        metastatic, d. 47s   Hypertension Sister    Arthritis Sister    Diabetes Sister    Heart attack Sister    Colon polyps Sister    Cancer Brother        dx 59s, unsure of type   Stroke Maternal Grandfather    Skin cancer Son        stage V, SCC vs BCC? dx 56s   Colon cancer Neg Hx    Esophageal cancer Neg Hx    Rectal cancer Neg Hx     ALLERGIES:  is allergic to ceclor [cefaclor].  MEDICATIONS:  Current Outpatient Medications  Medication Sig Dispense Refill   acetaminophen (TYLENOL) 500 MG  tablet Take 500 mg by mouth daily as needed for mild pain.      albuterol (VENTOLIN HFA) 108 (90 Base) MCG/ACT inhaler Inhale 1-2 puffs into the lungs every 6 (six) hours as needed for wheezing or shortness of breath.     atorvastatin (LIPITOR) 40 MG tablet Take 40 mg by mouth at bedtime.      buPROPion (WELLBUTRIN SR) 150 MG 12 hr tablet Take 150 mg by mouth daily.  3   calcium carbonate (OSCAL) 1500 (600 Ca) MG TABS tablet Take 600 mg of elemental calcium by mouth daily.     clopidogrel (PLAVIX) 75 MG tablet Take 75 mg by mouth daily.     ezetimibe (ZETIA) 10 MG tablet Take 10 mg by mouth at bedtime.      ferrous sulfate 325 (65 FE) MG tablet Take 1 tablet (325 mg total) by mouth daily with breakfast. 30 tablet 11   isosorbide mononitrate (IMDUR) 60 MG 24 hr tablet Take 1 tablet (60 mg total) by mouth daily. Please make overdue appt with Dr. Tamala Julian before anymore refills. Thank you  3rd and final attempt 15 tablet 0   nitroGLYCERIN (NITROSTAT) 0.4 MG SL tablet Place 1 tablet (0.4 mg total) under the tongue every 5 (five) minutes as needed for chest pain (Call 911 at 3rd dose within 15 minutes). 25 tablet 3   pantoprazole (PROTONIX) 40 MG tablet Take 1 tablet (40 mg total) by mouth daily. 30 tablet 11   sertraline (ZOLOFT) 100 MG tablet  Take 100 mg by mouth at bedtime.   3   tiotropium (SPIRIVA) 18 MCG inhalation capsule Place 18 mcg into inhaler and inhale every evening.     trimethoprim (TRIMPEX) 100 MG tablet Take 100 mg by mouth daily.     vitamin B-12 1000 MCG tablet Take 1 tablet (1,000 mcg total) by mouth daily.     No current facility-administered medications for this visit.    REVIEW OF SYSTEMS:   Constitutional: Denies fevers, chills or abnormal night sweats Eyes: Denies blurriness of vision, double vision or watery eyes Ears, nose, mouth, throat, and face: Denies mucositis or sore throat Respiratory: Denies cough, dyspnea or wheezes Cardiovascular: Denies palpitation, chest discomfort or lower extremity swelling Gastrointestinal:  Denies nausea, heartburn or change in bowel habits Skin: Denies abnormal skin rashes Lymphatics: Denies new lymphadenopathy or easy bruising Neurological:Denies numbness, tingling or new weaknesses Behavioral/Psych: Mood is stable, no new changes  Breast: Denies any palpable lumps or discharge All other systems were reviewed with the patient and are negative.  PHYSICAL EXAMINATION: ECOG PERFORMANCE STATUS: 0 - Asymptomatic  Vitals:   04/27/22 1408  BP: (!) 148/58  Pulse: 91  Resp: 16  Temp: 97.7 F (36.5 C)  SpO2: 97%   Filed Weights   04/27/22 1408  Weight: 205 lb 11.2 oz (93.3 kg)    GENERAL:alert, wheelchair-bound. SKIN: skin color, texture, turgor are normal, no rashes or significant lesions EYES: normal, conjunctiva are pink and non-injected, sclera clear OROPHARYNX:no exudate, no erythema and lips, buccal mucosa, and tongue normal  NECK: supple, thyroid normal size, non-tender, without nodularity LYMPH:  no palpable lymphadenopathy in the cervical, axillary  LUNGS: clear to auscultation and percussion with normal breathing effort HEART: regular rate & rhythm ABDOMEN:abdomen soft, non-tender and normal bowel sounds Musculoskeletal:no cyanosis of digits and no  clubbing  PSYCH: alert & oriented x 3 with fluent speech NEURO: no focal motor/sensory deficits BREAST: She is status post left mastectomy with some expander in place.  On the right side nipple retraction is noted.  Palpable mass in the retroareolar position measuring up to 4 cm. No regional adenopathy noted  LABORATORY DATA:  I have reviewed the data as listed Lab Results  Component Value Date   WBC 5.3 10/01/2020   HGB 7.7 (L) 10/01/2020   HCT 27.9 (L) 10/01/2020   MCV 71.2 (L) 10/01/2020   PLT 231 10/01/2020   Lab Results  Component Value Date   NA 140 10/01/2020   K 4.1 10/01/2020   CL 105 10/01/2020   CO2 27 10/01/2020    RADIOGRAPHIC STUDIES: I have personally reviewed the radiological reports and agreed with the findings in the report.  ASSESSMENT AND PLAN:  Malignant neoplasm of central portion of right breast in female, estrogen receptor positive ([place]) This is a very pleasant 77 year old postmenopausal female patient with Paget's disease of the left breast status postmastectomy now diagnosed with right breast multifocal invasive lobular carcinoma ranging from grade 1-2's ER strong positivity, PR positive in the first sample but negative in the second sample and HER2 negative Ki-67 40% referred to breast oncology for additional recommendations.  She arrived today to the appointment with her husband.  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  She denies any major changes in her health except for the retraction of the nipple which prompted imaging and biopsy.  Given her cardiac and other comorbidities, she will proceed with surgery upfront which is mastectomy once cardiac clearance is received. I do not believe she is a good candidate for any chemotherapy.  We discussed about antiestrogens and the role for reducing risk of recurrence and invasive lobular carcinoma  however given her complicated cardiac history and history of strokes, I am not entirely sure if she will be a good candidate for antiestrogen therapy either.  She understands that without antiestrogen therapy the chance of the breast cancer returning will be higher.  They seem to be at peace with this and they are okay with surveillance after mastectomy without any systemic therapy.  Benay Pike MD  Total time spent: 60 minutes including history, physical exam, review of records, counseling and coordination of care All questions were answered. The patient knows to call the clinic with any problems, questions or concerns.    Benay Pike, MD 04/27/22

## 2022-04-27 NOTE — Assessment & Plan Note (Signed)
This is a very pleasant 77 year old postmenopausal female patient with Paget's disease of the left breast status postmastectomy now diagnosed with right breast multifocal invasive lobular carcinoma ranging from grade 1-2's ER strong positivity, PR positive in the first sample but negative in the second sample and HER2 negative Ki-67 40% referred to breast oncology for additional recommendations.  She arrived today to the appointment with her husband.  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  She denies any major changes in her health except for the retraction of the nipple which prompted imaging and biopsy.  Given her cardiac and other comorbidities, she will proceed with surgery upfront which is mastectomy once cardiac clearance is received. I do not believe she is a good candidate for any chemotherapy.  We discussed about antiestrogens and the role for reducing risk of recurrence and invasive lobular carcinoma however given her complicated cardiac history and history of strokes, I am not entirely sure if she will be a good candidate for antiestrogen therapy either.  She understands that without antiestrogen therapy the chance of the breast cancer returning will be higher.  They seem to be at peace with this and they are okay with surveillance after mastectomy without any systemic therapy.  Benay Pike MD

## 2022-04-27 NOTE — Telephone Encounter (Signed)
LDM on answering machine about scheduled STRESS TEST on 04/28/22.

## 2022-04-28 ENCOUNTER — Ambulatory Visit (HOSPITAL_COMMUNITY): Payer: Medicare Other | Attending: Cardiology

## 2022-04-28 ENCOUNTER — Encounter: Payer: Self-pay | Admitting: *Deleted

## 2022-04-28 DIAGNOSIS — Z01818 Encounter for other preprocedural examination: Secondary | ICD-10-CM | POA: Diagnosis not present

## 2022-04-28 DIAGNOSIS — I201 Angina pectoris with documented spasm: Secondary | ICD-10-CM

## 2022-04-28 LAB — MYOCARDIAL PERFUSION IMAGING
LV dias vol: 80 mL (ref 46–106)
LV sys vol: 35 mL
Nuc Stress EF: 56 %
Peak HR: 87 {beats}/min
Rest HR: 67 {beats}/min
Rest Nuclear Isotope Dose: 10.2 mCi
SDS: 2
SRS: 3
SSS: 5
ST Depression (mm): 0 mm
Stress Nuclear Isotope Dose: 32.3 mCi
TID: 0.77

## 2022-04-28 MED ORDER — TECHNETIUM TC 99M TETROFOSMIN IV KIT
10.2000 | PACK | Freq: Once | INTRAVENOUS | Status: AC | PRN
  Administered 2022-04-28: 10.2 via INTRAVENOUS

## 2022-04-28 MED ORDER — REGADENOSON 0.4 MG/5ML IV SOLN
0.4000 mg | Freq: Once | INTRAVENOUS | Status: AC
  Administered 2022-04-28: 0.4 mg via INTRAVENOUS

## 2022-04-28 MED ORDER — TECHNETIUM TC 99M TETROFOSMIN IV KIT
32.3000 | PACK | Freq: Once | INTRAVENOUS | Status: AC | PRN
  Administered 2022-04-28: 32.3 via INTRAVENOUS

## 2022-05-02 ENCOUNTER — Ambulatory Visit: Payer: Self-pay | Admitting: Licensed Clinical Social Worker

## 2022-05-02 ENCOUNTER — Encounter: Payer: Self-pay | Admitting: Licensed Clinical Social Worker

## 2022-05-02 ENCOUNTER — Telehealth: Payer: Self-pay | Admitting: Licensed Clinical Social Worker

## 2022-05-02 DIAGNOSIS — Z1379 Encounter for other screening for genetic and chromosomal anomalies: Secondary | ICD-10-CM

## 2022-05-02 NOTE — Progress Notes (Signed)
HPI:   Brandy Paul was previously seen in the [place] clinic due to a personal and family history of cancer and concerns regarding a hereditary predisposition to cancer. Please refer to our prior cancer genetics clinic note for more information regarding our discussion, assessment and recommendations, at the time. Brandy Paul recent genetic test results were disclosed to her, as were recommendations warranted by these results. These results and recommendations are discussed in more detail below.  CANCER HISTORY:  Oncology History  Malignant neoplasm of central portion of right breast in female, estrogen receptor positive ([place])  03/31/2022 Mammogram   Mammogram right breast showed an indeterminate 1 cm mass in the right breast at 1:00.  There is also an indeterminate 3.4 cm area of shadowing tissue in the retroareolar right breast at 9:00.  Limited evaluation of the axilla demonstrates normal lymph nodes. US of the breast confirmed same findings.   04/10/2022 Pathology Results   Right breast needle core biopsy at 1:00 showed invasive lobular carcinoma, grade 2 prognostic showed ER 100% positive strong staining PR 85% positive strong staining Ki-67 of 40% and group 5 HER2 negative.  The retroareolar biopsy also confirmed invasive lobular carcinoma grade 1 which is ER 100% positive strong staining PR 0% negative Ki-67 of 40% and HER2 negative   04/27/2022 Initial Diagnosis   Malignant neoplasm of central portion of right breast in female, estrogen receptor positive ([place])     FAMILY HISTORY:  We obtained a detailed, 4-generation family history.  Significant diagnoses are listed below: Family History  Problem Relation Age of Onset   CVA Mother    Liver cancer Mother        d. 95   Prostate cancer Father        metastatic, d. 33s   Hypertension Sister    Arthritis Sister    Diabetes Sister    Heart attack Sister    Colon polyps Sister    Cancer Brother        dx 33s, unsure  of type   Stroke Maternal Grandfather    Skin cancer Son        stage V, SCC vs BCC? dx 42s   Colon cancer Neg Hx    Esophageal cancer Neg Hx    Rectal cancer Neg Hx    Brandy Paul has 1 son, 41 and 1 daughter, 46. Her son was diagnosed with stage IV skin cancer, either basal cell or squamous cell carcinoma, patient not sure which. Brandy Paul also had 2 sisters, both passed, no cancers. She has 1 brother who currently has cancer in his 80s, unsure type.   Brandy Paul has limited information about her maternal/paternal relatives. Her mother died of liver cancer at 15. Her father died of metastatic prostate cancer in his 38s. No other known relatives with cancer.   Brandy Paul is unaware of previous family history of genetic testing for hereditary cancer risks. There is no reported Ashkenazi Jewish ancestry. There is no known consanguinity.      GENETIC TEST RESULTS:  The [facility] Multi-Cancer+RNA Panel found no pathogenic mutations.   The Multi-Cancer + RNA Panel offered by [facility] includes sequencing and/or deletion/duplication analysis of the following 70 genes:  AIP*, ALK, APC*, ATM*, AXIN2*, BAP1*, BARD1*, BLM*, BMPR1A*, BRCA1*, BRCA2*, BRIP1*, CDC73*, CDH1*, CDK4, CDKN1B*, CDKN2A, CHEK2*, CTNNA1*, DICER1*, EPCAM, EGFR, FH*, FLCN*, GREM1, HOXB13, KIT, LZTR1, MAX*, MBD4, MEN1*, MET, MITF, MLH1*, MSH2*, MSH3*, MSH6*, MUTYH*, NF1*, NF2*, NTHL1*, PALB2*, PDGFRA, PMS2*, POLD1*, POLE*,  POT1*, PRKAR1A*, PTCH1*, PTEN*, RAD51C*, RAD51D*, RB1*, RET, SDHA*, SDHAF2*, SDHB*, SDHC*, SDHD*, SMAD4*, SMARCA4*, SMARCB1*, SMARCE1*, STK11*, SUFU*, TMEM127*, TP53*, TSC1*, TSC2*, VHL*. RNA analysis is performed for * genes.   The test report has been scanned into EPIC and is located under the Molecular Pathology section of the Results Review tab.  A portion of the result report is included below for reference. Genetic testing reported out on 05/01/2022.     Even though a pathogenic variant was not identified,  possible explanations for the cancer in the family may include: There may be no hereditary risk for cancer in the family. The cancers in Brandy Paul and/or her family may be sporadic/familial or due to other genetic and environmental factors. There may be a gene mutation in one of these genes that current testing methods cannot detect but that chance is small. There could be another gene that has not yet been discovered, or that we have not yet tested, that is responsible for the cancer diagnoses in the family.  It is also possible there is a hereditary cause for the cancer in the family that Brandy Paul did not inherit.  Therefore, it is important to remain in touch with cancer genetics in the future so that we can continue to offer Brandy Paul the most up to date genetic testing.   ADDITIONAL GENETIC TESTING:  We discussed with Brandy Paul that her genetic testing was fairly extensive.  If there are additional relevant genes identified to increase cancer risk that can be analyzed in the future, we would be happy to discuss and coordinate this testing at that time.    CANCER SCREENING RECOMMENDATIONS:  Brandy Paul test result is considered negative (normal).  This means that we have not identified a hereditary cause for her personal and family history of cancer at this time.   An individual's cancer risk and medical management are not determined by genetic test results alone. Overall cancer risk assessment incorporates additional factors, including personal medical history, family history, and any available genetic information that may result in a personalized plan for cancer prevention and surveillance. Therefore, it is recommended she continue to follow the cancer management and screening guidelines provided by her oncology and primary healthcare provider.  RECOMMENDATIONS FOR FAMILY MEMBERS:   Since she did not inherit a identifiable mutation in a cancer predisposition gene included on this panel,  her children could not have inherited a known mutation from her in one of these genes. Individuals in this family might be at some increased risk of developing cancer, over the general population risk, due to the family history of cancer.  Individuals in the family should notify their providers of the family history of cancer. We recommend women in this family have a yearly mammogram beginning at age 49, or 73 years younger than the earliest onset of cancer, an annual clinical breast exam, and perform monthly breast self-exams.  Family members should have colonoscopies by at age 38, or earlier, as recommended by their providers. Other members of the family may still carry a pathogenic variant in one of these genes that [name] did not inherit. Based on the family history, we recommend her paternal relatives/brother have genetic counseling and testing. Ms. Racer will let us know if we can be of any assistance in coordinating genetic counseling and/or testing for this family member.    FOLLOW-UP:  Lastly, we discussed with Ms. Interiano that cancer genetics is a rapidly advancing field and it is possible that  new genetic tests will be appropriate for her and/or her family members in the future. We encouraged her to remain in contact with cancer genetics on an annual basis so we can update her personal and family histories and let her know of advances in cancer genetics that may benefit this family.   Our contact number was provided. Ms. Croley questions were answered to her satisfaction, and she knows she is welcome to call us at anytime with additional questions or concerns.    Faith Rogue, MS, [facility] Genetic Counselor [email or web address] Phone: [phone]

## 2022-05-02 NOTE — Telephone Encounter (Signed)
I contacted Ms. Fishbaugh to discuss her genetic testing results. No pathogenic variants were identified in the 70 genes analyzed. Detailed clinic note to follow.   The test report has been scanned into EPIC and is located under the Molecular Pathology section of the Results Review tab.  A portion of the result report is included below for reference.     Faith Rogue, MS, [facility] Genetic Counselor [email or web address] Phone: [phone]

## 2022-05-11 ENCOUNTER — Encounter: Payer: Self-pay | Admitting: *Deleted

## 2022-05-11 ENCOUNTER — Ambulatory Visit: Payer: Medicare Other | Attending: Surgery

## 2022-05-11 ENCOUNTER — Other Ambulatory Visit: Payer: Self-pay | Admitting: *Deleted

## 2022-05-11 ENCOUNTER — Other Ambulatory Visit: Payer: Self-pay

## 2022-05-11 DIAGNOSIS — M25611 Stiffness of right shoulder, not elsewhere classified: Secondary | ICD-10-CM

## 2022-05-11 DIAGNOSIS — C50111 Malignant neoplasm of central portion of right female breast: Secondary | ICD-10-CM | POA: Diagnosis not present

## 2022-05-11 DIAGNOSIS — R293 Abnormal posture: Secondary | ICD-10-CM | POA: Diagnosis not present

## 2022-05-11 DIAGNOSIS — Z170 Estrogen receptor positive status [ER+]: Secondary | ICD-10-CM | POA: Diagnosis not present

## 2022-05-11 NOTE — Therapy (Signed)
OUTPATIENT PHYSICAL THERAPY BREAST CANCER BASELINE EVALUATION   Patient Name: Brandy Paul MRN: [id number] DOB:06-28-1945, 77 y.o., female Today's Date: 05/11/2022  END OF SESSION:  PT End of Session - 05/11/22 1458     Visit Number 1    Number of Visits 2    Date for PT Re-Evaluation 06/22/22    PT Start Time 1500    PT Stop Time [id number]    PT Time Calculation (min) 35 min    Activity Tolerance Patient tolerated treatment well    Behavior During Therapy WFL for tasks assessed/performed             Past Medical History:  Diagnosis Date   Adenomatous colon polyp    Breast cancer ([place]) 2005   left   COPD (chronic obstructive pulmonary disease) ([place])    hx of tobacco use   Coronary artery spasm ([facility])    Depression    GERD (gastroesophageal reflux disease)    Hyperlipemia    IDA (iron deficiency anemia)    last iron infusion 4 months ago   Obesity    Osteoporosis    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 ([place])    Prediabetes    hx of   Rash    under right breast and groin line using nizoral ointment to q day per dermatology saw few weeks ago   Stress incontinence wers depends   Stroke [facility]) 2013   paralytic syndrome of dominant right side   Toe fracture, left    Past Surgical History:  Procedure Laterality Date   ABDOMINAL HYSTERECTOMY  1989   complete   BIOPSY  11/11/2019   Procedure: BIOPSY;  Surgeon: Doran Stabler, MD;  Location: [facility];  Service: Gastroenterology;;   BREAST BIOPSY Right 04/10/2022   [place] RT BREAST BX W LOC DEV EA ADD LESION IMG BX SPEC US GUIDE 04/10/2022 [facility]   BREAST BIOPSY Right 04/10/2022   [place] RT BREAST BX W LOC DEV 1ST LESION IMG BX [place] US GUIDE 04/10/2022 [facility]   CHOLECYSTECTOMY N/A 12/10/2017   Procedure: LAPAROSCOPIC CHOLECYSTECTOMY WITH INTRAOPERATIVE  ERAS PATHWAY;  Surgeon: Johnathan Hausen, MD;  Location: [facility];  Service: General;  Laterality: N/A;   COLONOSCOPY WITH PROPOFOL N/A 12/03/2015    Procedure: COLONOSCOPY WITH PROPOFOL;  Surgeon: Mauri Pole, MD;  Location: [facility];  Service: Endoscopy;  Laterality: N/A;  tba before procdcedure   COLONOSCOPY WITH PROPOFOL N/A 11/11/2019   Procedure: COLONOSCOPY WITH PROPOFOL;  Surgeon: Doran Stabler, MD;  Location: [facility];  Service: Gastroenterology;  Laterality: N/A;   ENTEROSCOPY N/A 11/11/2019   Procedure: ENTEROSCOPY;  Surgeon: Doran Stabler, MD;  Location: [facility];  Service: Gastroenterology;  Laterality: N/A;   IR CHOLANGIOGRAM EXISTING TUBE  08/08/2017   IR CHOLANGIOGRAM EXISTING TUBE  08/27/2017   IR PERC CHOLECYSTOSTOMY  07/13/2017   LAPAROSCOPIC APPENDECTOMY N/A 12/10/2017   Procedure: APPENDECTOMY LAPAROSCOPIC;  Surgeon: Johnathan Hausen, MD;  Location: [facility];  Service: General;  Laterality: N/A;   MASTECTOMY Left 2005   1 lymph node removed   RADIAL KERATOTOMY Bilateral    ROBOTIC ASSISTED BILATERAL SALPINGO OOPHERECTOMY Bilateral 11/04/2015   Procedure: XI ROBOTIC ASSISTED BILATERAL SALPINGO OOPHORECTOMY;  Surgeon: Everitt Amber, MD;  Location: [facility];  Service: Gynecology;  Laterality: Bilateral;   TONSILLECTOMY     Patient Active Problem List   Diagnosis Date Noted   Genetic testing 05/02/2022   Malignant neoplasm of central portion of right breast  in female, estrogen receptor positive ([place]) 04/27/2022   GIB (gastrointestinal bleeding) 11/08/2019   COPD (chronic obstructive pulmonary disease) ([place])    Depression    GI bleed 11/07/2019   S/P laparoscopic cholecystectomy 12/10/2017   Transient speech disturbance    Stenosis of both middle cerebral arteries    History of stroke    Essential hypertension    TIA (transient ischemic attack) 09/17/2017   Acute cholecystitis 07/12/2017   Appendicitis 05/10/2017   DOE (dyspnea on exertion) 04/22/2017   Anemia 12/02/2015   Ovarian mass, left 11/04/2015   Pelvic mass in female 11/04/2015   Iron deficiency anemia 04/28/2015   Nonspecific abnormal  finding in stool contents 04/06/2015   Anemia, iron deficiency 04/06/2015   Encounter for long-term (current) use of antiplatelets/antithrombotics 04/06/2015   Hyperlipidemia 11/24/2014   Hemiparesis affecting right side as late effect of cerebrovascular accident ([place]) 11/24/2014   Coronary artery spasm ([place]) 10/09/2013   CVA, old, aphasia 10/09/2013   DM (diabetes mellitus), type 2, uncontrolled with complications [id number]   Gastroesophageal reflux disease without esophagitis 10/09/2013   Centrilobular emphysema ([place]) 10/09/2013       REFERRING PROVIDER: Dr. Georgette Dover  REFERRING DIAG: right Breast Cancer  THERAPY DIAG:  Malignant neoplasm of central portion of right breast in female, estrogen receptor positive ([place])  Abnormal posture  Stiffness of right shoulder, not elsewhere classified  Rationale for Evaluation and Treatment: Rehabilitation  ONSET DATE: 04/10/2022  SUBJECTIVE:                                                                                                                                                                                           SUBJECTIVE STATEMENT: Patient reports she is here today to be seen by her medical team for her newly diagnosed right breast cancer.  She is present with her husband who is her caregiver. She had a prior Left Mastectomy in 2005 with 1 LN removed for Paget's disease. Apparently she wore a left UE sleeve as a precaution for lymphedema for 2 years, but does not have swelling.  PERTINENT HISTORY:  Patient was diagnosed on 04/10/2022 with right grade 2 ILC. It measures 6.1 cm and is located in the central portion of right breast It is ER, PR+ , HER 2 - with a Ki67 of 40%.  Pt is scheduled for a Right mastectomy with SLNB  on 05/17/2022. Prior Left Mastectomy in 2005 for Pagets Disease with 1 LN removed. She has no use of the Right UE at all with her elbow  in flexion, firm end feels noted with PROM shoulder flexion and hand  contracted in flexion  PATIENT GOALS:   reduce lymphedema risk and learn post op HEP.   PAIN:  Are you having pain? No  PRECAUTIONS: Active CA ,Pagets disease, Prior Left Mastectomy, CVA 2013 and WC bound from it with right hemiparesis and Right UE contraction of hand and elbow, multiple MI, COPD, Pagets disease, Type II DM uncontrolled  HAND DOMINANCE: left due to unable to use right hand/arm  WEIGHT BEARING RESTRICTIONS: Yes due to CVA  FALLS:  Has patient fallen in last 6 months? Yes. Number of falls 1 slid out of bed  LIVING ENVIRONMENT: Patient lives with: husband Lives in: House/apartment Has following equipment at home: Lobbyist, Wheelchair (power), and Wheelchair (manual), bedside commode  OCCUPATION: NA  LEISURE: TV, gamble online  PRIOR LEVEL OF FUNCTION: Needs assistance with ADLs and Needs assistance with homemaking   OBJECTIVE:  COGNITION: Overall cognitive status: Impaired Poor memory   POSTURE:  Forward head and rounded shoulders posture  UPPER EXTREMITY AROM/PROM: Pts right UE contracted secondary to prior CVA  A/PROM RIGHT   eval   Shoulder extension   Shoulder flexion 15 AA  Shoulder abduction   Shoulder internal rotation   Shoulder external rotation     (Blank rows = not tested)  A/PROM LEFT   eval  Shoulder extension   Shoulder flexion   Shoulder abduction   Shoulder internal rotation   Shoulder external rotation     (Blank rows = not tested)  CERVICAL AROM: All within functional limits:      UPPER EXTREMITY STRENGTH: Right UE weakness/decreased ROM due to CVA  LYMPHEDEMA ASSESSMENTS:   LANDMARK RIGHT   eval  10 cm proximal to olecranon process 31.5  Olecranon process 27.4  10 cm proximal to ulnar styloid process 21.4  Just proximal to ulnar styloid process 16.9  Across hand at thumb web space Hand contracted  At base of 2nd digit Hand contracted  (Blank rows = not tested)  LANDMARK LEFT   eval  10 cm  proximal to olecranon process 39.8  Olecranon process 28.5  10 cm proximal to ulnar styloid process 22.7  Just proximal to ulnar styloid process 15.9  Across hand at thumb web space 16.9  At base of 2nd digit 5.2  (Blank rows = not tested)   QUICK DASH SURVEY: NA  PATIENT EDUCATION:  Education details: Lymphedema risk reduction and scapular retraction for  HEP, compression bra, ABC class Person educated: Patient Education method: Explanation, Demonstration, Handout Education comprehension: Patient verbalized understanding and returned demonstration  HOME EXERCISE PROGRAM: Patient was instructed today in a home exercise program today for scapular retraction. She will do 5 reps 2 times per day. She is unable to perform any of the other post op exercises due to stroke and contraction of muscles in right UE  ASSESSMENT:  CLINICAL IMPRESSION: Pts.multidisciplinary medical team met prior to her assessments to determine a recommended treatment plan. She is planning to have a Right Mastectomy with SLNB on 05/17/2022 but no chemo or radiation. She will benefit from a post op PT reassessment to determine needs at that time .  Pt will benefit from skilled therapeutic intervention to improve on the following deficits: Decreased knowledge of precautions, impaired UE functional use, pain, decreased ROM, postural dysfunction.   PT treatment/interventions: ADL/self-care home management, pt/family education, therapeutic exercise  REHAB POTENTIAL: Fair    CLINICAL DECISION MAKING: Stable/uncomplicated  EVALUATION COMPLEXITY: Low   GOALS: Goals reviewed with patient? YES  LONG TERM GOALS: (STG=LTG)  Name Target Date Goal status  1 Pt will be able to verbalize understanding of pertinent lymphedema risk reduction practices relevant to her dx specifically related to skin care.  Baseline:  No knowledge 05/11/2022 Achieved at eval  2 Pt will be able to return demo and/or verbalize understanding  of the post op HEP related to regaining shoulder ROM. Baseline:  No knowledge 05/11/2022 Achieved at eval for scapular retraction only  3 Pt will be able to verbalize understanding of the importance of attending the post op After Breast CA Class for further lymphedema risk reduction education and therapeutic exercise.  Baseline:  No knowledge 05/11/2022 Achieved at eval  4  Pt will have no signs of lymphedema in the Right UE post surgery incisions will be healing properly Baseline:  06/22/2022 INITIAL    PLAN:  PT FREQUENCY/DURATION: EVAL and 1 follow up appointment.   PLAN FOR NEXT SESSION: will reassess 4 weeks post op to determine needs. Assess right UE circumference, incision, remind about ABC class   Patient will follow up at outpatient cancer rehab 3-4 weeks following surgery.  If the patient requires physical therapy at that time, a specific plan will be dictated and sent to the referring physician for approval. The patient was educated today on appropriate basic range of motion exercises to begin post operatively and the importance of attending the After Breast Cancer class following surgery.  Patient was educated today on lymphedema risk reduction practices as it pertains to recommendations that will benefit the patient immediately following surgery.  She verbalized good understanding.    Physical Therapy Information for After Breast Cancer Surgery/Treatment:  Lymphedema is a swelling condition that you may be at risk for in your arm if you have lymph nodes removed from the armpit area.  After a sentinel node biopsy, the risk is approximately 5-9% and is higher after an axillary node dissection.  There is treatment available for this condition and it is not life-threatening.  Contact your physician or physical therapist with concerns. You may begin the 4 shoulder/posture exercises (see additional sheet) when permitted by your physician (typically a week after surgery).  If you have drains,  you may need to wait until those are removed before beginning range of motion exercises.  A general recommendation is to not lift your arms above shoulder height until drains are removed.  These exercises should be done to your tolerance and gently.  This is not a "no pain/no gain" type of recovery so listen to your body and stretch into the range of motion that you can tolerate, stopping if you have pain.  If you are having immediate reconstruction, ask your plastic surgeon about doing exercises as he or she may want you to wait. We encourage you to attend the free one time ABC (After Breast Cancer) class offered by [facility].  You will learn information related to lymphedema risk, prevention and treatment and additional exercises to regain mobility following surgery.  You can call [phone] for more information.  This is offered the 1st and 3rd Monday of each month.  You only attend the class one time. While undergoing any medical procedure or treatment, try to avoid blood pressure being taken or needle sticks from occurring on the arm on the side of cancer.   This recommendation begins after surgery and continues for the rest of your life.  This may help reduce your risk of getting lymphedema (swelling in your arm). An excellent resource for those  seeking information on lymphedema is the National Lymphedema Network's web site. It can be accessed at [place].org If you notice swelling in your hand, arm or breast at any time following surgery (even if it is many years from now), please contact your doctor or physical therapist to discuss this.  Lymphedema can be treated at any time but it is easier for you if it is treated early on.  If you feel like your shoulder motion is not returning to normal in a reasonable amount of time, please contact your surgeon or physical therapist.  [facility] [phone]. [address], [place]  [place] [address]  ABC CLASS After Breast Cancer Class  After Breast Cancer Class is a specially designed exercise class to assist you in a safe recover after having breast cancer surgery.  In this class you will learn how to get back to full function whether your drains were just removed or if you had surgery a month ago.  This one-time class is held the 1st and 3rd Monday of every month from 11:00 a.m. until 12:00 noon virtually.  This class is FREE and space is limited. For more information or to register for the next available class, call [phone].  Class Goals  Understand specific stretches to improve the flexibility of you chest and shoulder. Learn ways to safely strengthen your upper body and improve your posture. Understand the warning signs of infection and why you may be at risk for an arm infection. Learn about Lymphedema and prevention.  ** You do not attend this class until after surgery.  Drains must be removed to participate  Patient was instructed today in a home exercise program today for post op shoulder range of motion. These included active assist shoulder flexion in sitting, scapular retraction, wall walking with shoulder abduction, and hands behind head external rotation.  She was encouraged to do these twice a day, holding 3 seconds and repeating 5 times when permitted by her physician.    Claris Pong, PT 05/11/2022, 5:09 PM

## 2022-05-12 NOTE — Pre-Procedure Instructions (Signed)
Surgical Instructions    Your procedure is scheduled on May 17, 2022.  Report to [facility] Main Entrance "A" at 12:30 P.M., then check in with the Admitting office.  Call this number if you have problems the morning of surgery:  [phone]  If you have any questions prior to your surgery date call [phone]: Open Monday-Friday 8am-4pm If you experience any cold or flu symptoms such as cough, fever, chills, shortness of breath, etc. between now and your scheduled surgery, please notify us at the above number.     Remember:  Do not eat after midnight the night before your surgery  You may drink clear liquids until 11:30 AM the morning of your surgery.   Clear liquids allowed are: Water, Non-Citrus Juices (without pulp), Carbonated Beverages, Clear Tea, Black Coffee Only (NO MILK, CREAM OR POWDERED CREAMER of any kind), and Gatorade.  Patient Instructions  The night before surgery:  No food after midnight. ONLY clear liquids after midnight  The day of surgery (if you do NOT have diabetes):  Drink ONE (1) Pre-Surgery Clear Ensure by 11:30 AM the morning of surgery. Drink in one sitting. Do not sip.  This drink was given to you during your hospital  pre-op appointment visit.  Nothing else to drink after completing the  Pre-Surgery Clear Ensure.         If you have questions, please contact your surgeon's office.     Take these medicines the morning of surgery with A SIP OF WATER:  buPROPion (WELLBUTRIN SR)   isosorbide mononitrate (IMDUR)   pantoprazole (PROTONIX)   Vibegron (GEMTESA)   trimethoprim (TRIMPEX)    May take these medicines IF NEEDED:  acetaminophen (TYLENOL)   albuterol (VENTOLIN HFA) inhaler nitroGLYCERIN (NITROSTAT)      STOP taking your clopidogrel (PLAVIX) five days prior to surgery. Your last dose of this medication will be February 22nd.   As of today, STOP taking any Aspirin (unless otherwise instructed by your surgeon) Aleve, Naproxen,  Ibuprofen, Motrin, Advil, Goody's, BC's, all herbal medications, fish oil, and all vitamins.                     Do NOT Smoke (Tobacco/Vaping) for 24 hours prior to your procedure.  If you use a CPAP at night, you may bring your mask/headgear for your overnight stay.   Contacts, glasses, piercing's, hearing aid's, dentures or partials may not be worn into surgery, please bring cases for these belongings.    For patients admitted to the hospital, discharge time will be determined by your treatment team.   Patients discharged the day of surgery will not be allowed to drive home, and someone needs to stay with them for 24 hours.  SURGICAL WAITING ROOM VISITATION Patients having surgery or a procedure may have no more than 2 support people in the waiting area - these visitors may rotate.   Children under the age of 32 must have an adult with them who is not the patient. If the patient needs to stay at the hospital during part of their recovery, the visitor guidelines for inpatient rooms apply. Pre-op nurse will coordinate an appropriate time for 1 support person to accompany patient in pre-op.  This support person may not rotate.   Please refer to the [facility] website for the visitor guidelines for Inpatients (after your surgery is over and you are in a regular room).    Special instructions:   [facility]- Preparing For Surgery  Before  surgery, you can play an important role. Because skin is not sterile, your skin needs to be as free of germs as possible. You can reduce the number of germs on your skin by washing with CHG (chlorahexidine gluconate) Soap before surgery.  CHG is an antiseptic cleaner which kills germs and bonds with the skin to continue killing germs even after washing.    Oral Hygiene is also important to reduce your risk of infection.  Remember - BRUSH YOUR TEETH THE MORNING OF SURGERY WITH YOUR REGULAR TOOTHPASTE  Please do not use if you have an allergy to CHG or  antibacterial soaps. If your skin becomes reddened/irritated stop using the CHG.  Do not shave (including legs and underarms) for at least 48 hours prior to first CHG shower. It is OK to shave your face.  Please follow these instructions carefully.   Shower the NIGHT BEFORE SURGERY and the MORNING OF SURGERY  If you chose to wash your hair, wash your hair first as usual with your normal shampoo.  After you shampoo, rinse your hair and body thoroughly to remove the shampoo.  Use CHG Soap as you would any other liquid soap. You can apply CHG directly to the skin and wash gently with a scrungie or a clean washcloth.   Apply the CHG Soap to your body ONLY FROM THE NECK DOWN.  Do not use on open wounds or open sores. Avoid contact with your eyes, ears, mouth and genitals (private parts). Wash Face and genitals (private parts)  with your normal soap.   Wash thoroughly, paying special attention to the area where your surgery will be performed.  Thoroughly rinse your body with warm water from the neck down.  DO NOT shower/wash with your normal soap after using and rinsing off the CHG Soap.  Pat yourself dry with a CLEAN TOWEL.  Wear CLEAN PAJAMAS to bed the night before surgery  Place CLEAN SHEETS on your bed the night before your surgery  DO NOT SLEEP WITH PETS.   Day of Surgery: Take a shower with CHG soap. Do not wear jewelry or makeup Do not wear lotions, powders, perfumes/colognes, or deodorant. Do not shave 48 hours prior to surgery.  Men may shave face and neck. Do not bring valuables to the hospital.  [facility] is not responsible for any belongings or valuables. Do not wear nail polish, gel polish, artificial nails, or any other type of covering on natural nails (fingers and toes) If you have artificial nails or gel coating that need to be removed by a nail salon, please have this removed prior to surgery. Artificial nails or gel coating may interfere with anesthesia's ability  to adequately monitor your vital signs.  Wear Clean/Comfortable clothing the morning of surgery Remember to brush your teeth WITH YOUR REGULAR TOOTHPASTE.   Please read over the following fact sheets that you were given.    If you received a COVID test during your pre-op visit  it is requested that you wear a mask when out in public, stay away from anyone that may not be feeling well and notify your surgeon if you develop symptoms. If you have been in contact with anyone that has tested positive in the last 10 days please notify you surgeon.

## 2022-05-15 ENCOUNTER — Other Ambulatory Visit: Payer: Self-pay

## 2022-05-15 ENCOUNTER — Encounter (HOSPITAL_COMMUNITY)
Admission: RE | Admit: 2022-05-15 | Discharge: 2022-05-15 | Disposition: A | Discharge status: Home / Self Care | Payer: Medicare Other | Source: Ambulatory Visit | Attending: Surgery | Admitting: Surgery

## 2022-05-15 ENCOUNTER — Encounter (HOSPITAL_COMMUNITY): Payer: Self-pay

## 2022-05-15 VITALS — BP 134/53 | HR 80 | Temp 98.2°F | Resp 17 | Ht 63.0 in | Wt 205.0 lb

## 2022-05-15 DIAGNOSIS — Z993 Dependence on wheelchair: Secondary | ICD-10-CM | POA: Insufficient documentation

## 2022-05-15 DIAGNOSIS — Z01812 Encounter for preprocedural laboratory examination: Secondary | ICD-10-CM | POA: Insufficient documentation

## 2022-05-15 DIAGNOSIS — D649 Anemia, unspecified: Secondary | ICD-10-CM

## 2022-05-15 DIAGNOSIS — J449 Chronic obstructive pulmonary disease, unspecified: Secondary | ICD-10-CM | POA: Insufficient documentation

## 2022-05-15 DIAGNOSIS — E785 Hyperlipidemia, unspecified: Secondary | ICD-10-CM | POA: Insufficient documentation

## 2022-05-15 DIAGNOSIS — K219 Gastro-esophageal reflux disease without esophagitis: Secondary | ICD-10-CM | POA: Diagnosis not present

## 2022-05-15 DIAGNOSIS — Z87891 Personal history of nicotine dependence: Secondary | ICD-10-CM | POA: Diagnosis not present

## 2022-05-15 DIAGNOSIS — I2510 Atherosclerotic heart disease of native coronary artery without angina pectoris: Secondary | ICD-10-CM | POA: Diagnosis not present

## 2022-05-15 DIAGNOSIS — Z8673 Personal history of transient ischemic attack (TIA), and cerebral infarction without residual deficits: Secondary | ICD-10-CM | POA: Insufficient documentation

## 2022-05-15 DIAGNOSIS — Z853 Personal history of malignant neoplasm of breast: Secondary | ICD-10-CM | POA: Diagnosis not present

## 2022-05-15 DIAGNOSIS — Z01818 Encounter for other preprocedural examination: Secondary | ICD-10-CM

## 2022-05-15 DIAGNOSIS — C50911 Malignant neoplasm of unspecified site of right female breast: Secondary | ICD-10-CM | POA: Diagnosis not present

## 2022-05-15 HISTORY — DX: Personal history of other diseases of the digestive system: Z87.19

## 2022-05-15 HISTORY — DX: Dyspnea, unspecified: R06.00

## 2022-05-15 HISTORY — DX: Angina pectoris, unspecified: I20.9

## 2022-05-15 LAB — BASIC METABOLIC PANEL
Anion gap: 6 (ref 5–15)
BUN: 17 mg/dL (ref 8–23)
CO2: 28 mmol/L (ref 22–32)
Calcium: 8.9 mg/dL (ref 8.9–10.3)
Chloride: 104 mmol/L (ref 98–111)
Creatinine, Ser: 1.21 mg/dL — ABNORMAL HIGH (ref 0.44–1.00)
GFR, Estimated: 46 mL/min — ABNORMAL LOW (ref >60)
Glucose, Bld: 119 mg/dL — ABNORMAL HIGH (ref 70–99)
Potassium: 4 mmol/L (ref 3.5–5.1)
Sodium: 138 mmol/L (ref 135–145)

## 2022-05-15 LAB — CBC
HCT: 29.3 % — ABNORMAL LOW (ref 36.0–46.0)
Hemoglobin: 7.8 g/dL — ABNORMAL LOW (ref 12.0–15.0)
MCH: 18.8 pg — ABNORMAL LOW (ref 26.0–34.0)
MCHC: 26.6 g/dL — ABNORMAL LOW (ref 30.0–36.0)
MCV: 70.6 fL — ABNORMAL LOW (ref 80.0–100.0)
Platelets: 222 10*3/uL (ref 150–400)
RBC: 4.15 MIL/uL (ref 3.87–5.11)
RDW: 22.4 % — ABNORMAL HIGH (ref 11.5–15.5)
WBC: 5.2 10*3/uL (ref 4.0–10.5)
nRBC: 0 % (ref 0.0–0.2)

## 2022-05-15 NOTE — Progress Notes (Addendum)
PCP - Dr. Velna Hatchet Cardiologist - Dr. Candee Furbish  PPM/ICD - Denies Device Orders - n/a Rep Notified - n/a  Chest x-ray - n/a EKG - 04/21/2022 Stress Test - 04/28/2022 ECHO - 09/18/2017 Cardiac Cath - Denies  Sleep Study - Denies CPAP - n/a  No DM  Last dose of GLP1 agonist- n/a GLP1 instructions: n/a  Blood Thinner Instructions: Hold Plavix for 5 days. Pts last dose was Feb. 22nd Aspirin Instructions: n/a  ERAS Protcol - Clear liquids until 1130 morning of surgery PRE-SURGERY Ensure or G2- Ensure given to pt with instructions  COVID TEST- n/a   Anesthesia review: Yes. Cardiac Clearance 04/21/22. Abnormal Hgb result 7.8.    Patient denies shortness of breath, fever, cough and chest pain at PAT appointment   All instructions explained to the patient and patients husband, with a verbal understanding of the material. Patient agrees to go over the instructions while at home for a better understanding. Patient also instructed to self quarantine after being tested for COVID-19. The opportunity to ask questions was provided.

## 2022-05-16 NOTE — Progress Notes (Signed)
Anesthesia Chart Review:  Case: [id number] Date/Time: 05/17/22 1415   Procedure: RIGHT MASTECTOMY WITH SENTINEL LYMPH NODE BIOPSY (Right: Breast)   Anesthesia type: General   Pre-op diagnosis: RIGHT BREAST MULTICENTRIC INVASIVE LOBULAR CARCINOMA   Location: [facility] OR ROOM 02 / [facility]   Surgeons: Donnie Mesa, MD       DISCUSSION: Patient is a 77 year old female scheduled for the above procedure.   History includes former smoker (quit 02/17/94), COPD, coronary artery spasm (diagnosed 2003), HLD, hiatal hernia, GERD, dyspnea, CVA (2013), prediabetes, IDA, breast cancer (Paget's disease left breast s/p mastectomy 2003; right breast multifocal invasive lobular carcinoma 04/10/22).  She saw cardiologist Dr. Marlou Porch on 04/21/22 for preoperative cardiology evaluation. Coronary artery spasms controlled on isosorbide. Activity is limited (wheelchair bound) due to her CVA, so she could not meet 4 METS. A stress test was ordered and done on 04/28/22 showing a fixed defect in inferior/inferolateral wall with mild wall motion abnormality but no ischemia and felt most consistent with prior infarct. After review, Dr. Marlou Porch wrote, "She may proceed with lumpectomy with low overall cardiac risk. Stress test shows possible old inferolateral infarct pattern, but pump function is overall normal. She may proceed."  Preoperative labs on 05/15/22 showed H/H 7.8/29.3.  Most recent comparison labs noted are from 11/01/21 showing H/H 10.4/32.7. (She has known IDA with HGB 5.6, s/p 2 Units PRBC with HGB up to 7.7 09/2020 and had erosive esophagitis on EGD 10/2019.) She has biopsy proven right breast cancer and needs mastectomy. H/H results reviewed with anesthesiologist Hoy Morn, MD and also called to [place] triage nurse Abigail Butts who will review with Dr. Georgette Dover. If plan is to proceed then will need orders for T&S and Prepare PRBC. Would defer decision to transfuse to surgeon and/or anesthesiologist. BP 134/52, HR 80 at PAT. Last Plavix  05/11/22.   VS: BP (!) 134/53   Pulse 80   Temp 36.8 C   Resp 17   Ht '5\' 3"'$  (1.6 m)   Wt 93 kg   SpO2 96%   BMI 36.31 kg/m    PROVIDERS: Velna Hatchet, MD is PCP  Benay Pike, MD is HEM-ONC Candee Furbish, MD is cardiologist   LABS: Preoperative labs noted. See DISCUSSION. A1c 5.9% 10/31/21 (Care Everywhere). (all labs ordered are listed, but only abnormal results are displayed)  Labs Reviewed  BASIC METABOLIC PANEL - Abnormal; Notable for the following components:      Result Value   Glucose, Bld 119 (*)    Creatinine, Ser 1.21 (*)    GFR, Estimated 46 (*)    All other components within normal limits  CBC - Abnormal; Notable for the following components:   Hemoglobin 7.8 (*)    HCT 29.3 (*)    MCV 70.6 (*)    MCH 18.8 (*)    MCHC 26.6 (*)    RDW 22.4 (*)    All other components within normal limits    EKG: 04/21/22 ([facility]): SR with frequent PVCs   CV: Nuclear stress test 04/28/22:   Findings are consistent with infarction. The study is low risk.   No ST deviation was noted.   LV perfusion is abnormal. There is no evidence of ischemia. There is evidence of infarction. Defect 1: There is a medium defect with moderate reduction in uptake present in the apical to basal inferior and inferolateral location(s) that is fixed. There is abnormal wall motion in the defect area. Consistent with infarction.   Left ventricular function is normal,  globally, with mild regional wall motion abnormalities as noted. End diastolic cavity size is normal. End systolic cavity size is normal.   Prior study available for comparison from 10/20/2015. There are changes compared to prior study which appear to be new.   Fixed defect in inferior/inferolateral wall without evidence of ischemia. Appears there is a mild wall motion abnormality also in this region, most consistent with infarct.     Echo 09/18/17: Study Conclusions  - Left ventricle: The cavity size was normal. Wall thickness  was    normal. Systolic function was normal. The estimated ejection    fraction was in the range of 50% to 55%. Wall motion was normal;    there were no regional wall motion abnormalities. Doppler    parameters are consistent with abnormal left ventricular    relaxation (grade 1 diastolic dysfunction).  - Mitral valve: Calcified annulus. There was mild regurgitation.  - Tricuspid valve: There was mild-moderate regurgitation.   Impressions:  - Normal LV systolic function; mild distolic dysfunction; mild MR;    negative saline microcavitation study.    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 ([place])    Prediabetes    hx of   Rash    under right breast and groin line using nizoral ointment to q day per dermatology saw few weeks ago   Stress incontinence wers depends   Stroke [facility]) 2013   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   Toe fracture, left     Past Surgical History:  Procedure Laterality Date   ABDOMINAL HYSTERECTOMY  1989   complete   APPENDECTOMY  2019   with gallbladder   BIOPSY  11/11/2019   Procedure: BIOPSY;  Surgeon: Doran Stabler, MD;  Location: [facility];  Service: Gastroenterology;;   BREAST BIOPSY Right 04/10/2022   [place] RT BREAST BX W LOC DEV EA ADD LESION IMG BX SPEC US GUIDE 04/10/2022 [facility]   BREAST BIOPSY Right 04/10/2022   [place] RT BREAST BX W LOC DEV 1ST LESION IMG BX SPEC US GUIDE 04/10/2022 [facility]   CATARACT EXTRACTION Bilateral    Late  1990s   CHOLECYSTECTOMY N/A 12/10/2017   Procedure: LAPAROSCOPIC CHOLECYSTECTOMY WITH INTRAOPERATIVE  ERAS PATHWAY;  Surgeon: Johnathan Hausen, MD;  Location: [facility];  Service: General;  Laterality: N/A;   COLONOSCOPY WITH PROPOFOL N/A 12/03/2015   Procedure: COLONOSCOPY WITH PROPOFOL;  Surgeon: Mauri Pole, MD;  Location: [facility];  Service: Endoscopy;  Laterality: N/A;  tba before procdcedure   COLONOSCOPY WITH PROPOFOL N/A 11/11/2019   Procedure: COLONOSCOPY WITH PROPOFOL;  Surgeon: Doran Stabler, MD;  Location: [facility];  Service: Gastroenterology;  Laterality: N/A;   ENTEROSCOPY N/A 11/11/2019   Procedure: ENTEROSCOPY;  Surgeon: Doran Stabler, MD;  Location: [facility];  Service: Gastroenterology;  Laterality: N/A;   IR CHOLANGIOGRAM EXISTING TUBE  08/08/2017   IR CHOLANGIOGRAM EXISTING TUBE  08/27/2017   IR PERC CHOLECYSTOSTOMY  07/13/2017   LAPAROSCOPIC APPENDECTOMY N/A 12/10/2017   Procedure: APPENDECTOMY LAPAROSCOPIC;  Surgeon: Johnathan Hausen, MD;  Location: [facility];  Service: General;  Laterality: N/A;   MASTECTOMY Left 2005   1 lymph node removed   RADIAL KERATOTOMY Bilateral    ROBOTIC ASSISTED BILATERAL SALPINGO OOPHERECTOMY Bilateral 11/04/2015   Procedure: XI ROBOTIC ASSISTED BILATERAL SALPINGO OOPHORECTOMY;  Surgeon: Everitt Amber, MD;  Location: [facility];  Service: Gynecology;  Laterality: Bilateral;   TONSILLECTOMY      MEDICATIONS:  acetaminophen (TYLENOL) 500 MG tablet   albuterol (VENTOLIN HFA) 108 (90 Base) MCG/ACT inhaler   atorvastatin (LIPITOR) 40 MG tablet   buPROPion (WELLBUTRIN SR) 150 MG 12 hr tablet   calcium carbonate (OSCAL) 1500 (600 Ca) MG TABS tablet   clopidogrel (PLAVIX) 75 MG tablet   ezetimibe (ZETIA) 10 MG tablet   ferrous sulfate 325 (65 FE) MG tablet   isosorbide mononitrate (IMDUR) 60 MG 24 hr tablet   nitroGLYCERIN (NITROSTAT) 0.4 MG SL tablet   pantoprazole (PROTONIX) 40 MG tablet   sertraline (ZOLOFT) 100 MG tablet    tiotropium (SPIRIVA) 18 MCG inhalation capsule   trimethoprim (TRIMPEX) 100 MG tablet   Vibegron (GEMTESA) 75 MG TABS   vitamin B-12 1000 MCG tablet   No current facility-administered medications for this encounter.    Myra Gianotti, PA-C Surgical Short Stay/Anesthesiology [facility] Phone [phone] [facility] Phone [phone] 05/16/2022 1:19 PM

## 2022-05-16 NOTE — Anesthesia Preprocedure Evaluation (Addendum)
Anesthesia Evaluation  Patient identified by MRN, date of birth, ID band Patient awake    Reviewed: Allergy & Precautions, NPO status , Patient's Chart, lab work & pertinent test results  History of Anesthesia Complications Negative for: history of anesthetic complications  Airway Mallampati: III  TM Distance: >3 FB Neck ROM: Full    Dental no notable dental hx. (+) Dental Advisory Given   Pulmonary COPD, former smoker   Pulmonary exam normal        Cardiovascular hypertension, + CAD  Normal cardiovascular exam  Nuclear stress test 04/28/22:   Findings are consistent with infarction. The study is low risk.   No ST deviation was noted.   LV perfusion is abnormal. There is no evidence of ischemia. There is evidence of infarction. Defect 1: There is a medium defect with moderate reduction in uptake present in the apical to basal inferior and inferolateral location(s) that is fixed. There is abnormal wall motion in the defect area. Consistent with infarction.   Left ventricular function is normal, globally, with mild regional wall motion abnormalities as noted. End diastolic cavity size is normal. End systolic cavity size is normal.   Prior study available for comparison from 10/20/2015. There are changes compared to prior study which appear to be new.   Fixed defect in inferior/inferolateral wall without evidence of ischemia. Appears there is a mild wall motion abnormality also in this region, most consistent with infarct.       Neuro/Psych  PSYCHIATRIC DISORDERS  Depression    TIA Neuromuscular disease CVA, Residual Symptoms    GI/Hepatic Neg liver ROS,GERD  ,,  Endo/Other  diabetes    Renal/GU negative Renal ROS     Musculoskeletal negative musculoskeletal ROS (+)    Abdominal   Peds  Hematology  (+) Blood dyscrasia, anemia   Anesthesia Other Findings   Reproductive/Obstetrics                               Anesthesia Physical Anesthesia Plan  ASA: 3  Anesthesia Plan: General   Post-op Pain Management: Regional block* and Tylenol PO (pre-op)*   Induction: Intravenous  PONV Risk Score and Plan: 3 and Propofol infusion, Ondansetron and Dexamethasone  Airway Management Planned: LMA  Additional Equipment:   Intra-op Plan:   Post-operative Plan: Extubation in OR  Informed Consent: I have reviewed the patients History and Physical, chart, labs and discussed the procedure including the risks, benefits and alternatives for the proposed anesthesia with the patient or authorized representative who has indicated his/her understanding and acceptance.     Dental advisory given  Plan Discussed with: Anesthesiologist and CRNA  Anesthesia Plan Comments: (See PAT note written 05/16/2022 by Myra Gianotti, PA-C. She has preoperative cardiology input. She is for T&C, Prepare PRBC on the day of surgery. Defer decision for transfusion to surgeon and/or anesthesiologist.   )         Anesthesia Quick Evaluation

## 2022-05-17 ENCOUNTER — Encounter (HOSPITAL_COMMUNITY): Payer: Self-pay | Admitting: Surgery

## 2022-05-17 ENCOUNTER — Ambulatory Visit (HOSPITAL_COMMUNITY): Payer: Medicare Other | Admitting: Physician Assistant

## 2022-05-17 ENCOUNTER — Ambulatory Visit (HOSPITAL_BASED_OUTPATIENT_CLINIC_OR_DEPARTMENT_OTHER): Payer: Medicare Other | Admitting: Anesthesiology

## 2022-05-17 ENCOUNTER — Encounter (HOSPITAL_COMMUNITY): Payer: Medicare Other

## 2022-05-17 ENCOUNTER — Encounter (HOSPITAL_COMMUNITY)
Admission: RE | Disposition: A | Discharge status: Home / Self Care | Payer: Self-pay | Source: Home / Self Care | Attending: Surgery

## 2022-05-17 ENCOUNTER — Observation Stay (HOSPITAL_COMMUNITY)
Admission: RE | Admit: 2022-05-17 | Discharge: 2022-05-18 | Disposition: A | Discharge status: Home / Self Care | Payer: Medicare Other | Attending: Surgery | Admitting: Surgery

## 2022-05-17 ENCOUNTER — Other Ambulatory Visit: Payer: Self-pay

## 2022-05-17 DIAGNOSIS — Z7902 Long term (current) use of antithrombotics/antiplatelets: Secondary | ICD-10-CM | POA: Diagnosis not present

## 2022-05-17 DIAGNOSIS — C50911 Malignant neoplasm of unspecified site of right female breast: Principal | ICD-10-CM | POA: Diagnosis present

## 2022-05-17 DIAGNOSIS — Z01818 Encounter for other preprocedural examination: Secondary | ICD-10-CM

## 2022-05-17 DIAGNOSIS — F17210 Nicotine dependence, cigarettes, uncomplicated: Secondary | ICD-10-CM | POA: Insufficient documentation

## 2022-05-17 DIAGNOSIS — I10 Essential (primary) hypertension: Secondary | ICD-10-CM | POA: Diagnosis not present

## 2022-05-17 DIAGNOSIS — Z79899 Other long term (current) drug therapy: Secondary | ICD-10-CM | POA: Diagnosis not present

## 2022-05-17 DIAGNOSIS — D649 Anemia, unspecified: Secondary | ICD-10-CM

## 2022-05-17 DIAGNOSIS — Z170 Estrogen receptor positive status [ER+]: Secondary | ICD-10-CM

## 2022-05-17 DIAGNOSIS — I509 Heart failure, unspecified: Secondary | ICD-10-CM | POA: Insufficient documentation

## 2022-05-17 DIAGNOSIS — I110 Hypertensive heart disease with heart failure: Secondary | ICD-10-CM | POA: Diagnosis not present

## 2022-05-17 DIAGNOSIS — J449 Chronic obstructive pulmonary disease, unspecified: Secondary | ICD-10-CM | POA: Diagnosis not present

## 2022-05-17 DIAGNOSIS — Z8673 Personal history of transient ischemic attack (TIA), and cerebral infarction without residual deficits: Secondary | ICD-10-CM | POA: Diagnosis not present

## 2022-05-17 DIAGNOSIS — I2510 Atherosclerotic heart disease of native coronary artery without angina pectoris: Secondary | ICD-10-CM

## 2022-05-17 DIAGNOSIS — D509 Iron deficiency anemia, unspecified: Secondary | ICD-10-CM

## 2022-05-17 DIAGNOSIS — Z87891 Personal history of nicotine dependence: Secondary | ICD-10-CM | POA: Diagnosis not present

## 2022-05-17 DIAGNOSIS — N6031 Fibrosclerosis of right breast: Secondary | ICD-10-CM | POA: Diagnosis not present

## 2022-05-17 DIAGNOSIS — C50111 Malignant neoplasm of central portion of right female breast: Secondary | ICD-10-CM

## 2022-05-17 DIAGNOSIS — G8918 Other acute postprocedural pain: Secondary | ICD-10-CM | POA: Diagnosis not present

## 2022-05-17 DIAGNOSIS — D630 Anemia in neoplastic disease: Secondary | ICD-10-CM | POA: Diagnosis not present

## 2022-05-17 HISTORY — PX: MASTECTOMY W/ SENTINEL NODE BIOPSY: SHX2001

## 2022-05-17 LAB — PREPARE RBC (CROSSMATCH)

## 2022-05-17 SURGERY — MASTECTOMY WITH SENTINEL LYMPH NODE BIOPSY
Anesthesia: General | Site: Breast | Laterality: Right

## 2022-05-17 MED ORDER — EZETIMIBE 10 MG PO TABS
10.0000 mg | ORAL_TABLET | Freq: Every day | ORAL | Status: DC
  Administered 2022-05-17: 10 mg via ORAL
  Filled 2022-05-17: qty 1

## 2022-05-17 MED ORDER — LIDOCAINE 2% (20 MG/ML) 5 ML SYRINGE
INTRAMUSCULAR | Status: DC | PRN
  Administered 2022-05-17: 100 mg via INTRAVENOUS

## 2022-05-17 MED ORDER — UMECLIDINIUM BROMIDE 62.5 MCG/ACT IN AEPB
1.0000 | INHALATION_SPRAY | Freq: Every day | RESPIRATORY_TRACT | Status: DC
  Filled 2022-05-17: qty 7

## 2022-05-17 MED ORDER — FENTANYL CITRATE (PF) 100 MCG/2ML IJ SOLN
100.0000 ug | Freq: Once | INTRAMUSCULAR | Status: AC
  Filled 2022-05-17: qty 2

## 2022-05-17 MED ORDER — HEMOSTATIC AGENTS (NO CHARGE) OPTIME
TOPICAL | Status: DC | PRN
  Administered 2022-05-17: 2

## 2022-05-17 MED ORDER — ONDANSETRON HCL 4 MG/2ML IJ SOLN: 4.0000 mg | Freq: Four times a day (QID) | INTRAMUSCULAR | Status: DC | PRN

## 2022-05-17 MED ORDER — ORAL CARE MOUTH RINSE: 15.0000 mL | Freq: Once | OROMUCOSAL | Status: AC

## 2022-05-17 MED ORDER — ISOSORBIDE MONONITRATE ER 60 MG PO TB24: 60.0000 mg | ORAL_TABLET | Freq: Every day | ORAL | Status: DC

## 2022-05-17 MED ORDER — CHLORHEXIDINE GLUCONATE CLOTH 2 % EX PADS: 6.0000 | MEDICATED_PAD | Freq: Once | CUTANEOUS | Status: DC

## 2022-05-17 MED ORDER — 0.9 % SODIUM CHLORIDE (POUR BTL) OPTIME
TOPICAL | Status: DC | PRN
  Administered 2022-05-17: 1000 mL

## 2022-05-17 MED ORDER — FERROUS SULFATE 325 (65 FE) MG PO TABS: 325.0000 mg | ORAL_TABLET | Freq: Every day | ORAL | Status: DC

## 2022-05-17 MED ORDER — TRAMADOL HCL 50 MG PO TABS
50.0000 mg | ORAL_TABLET | Freq: Four times a day (QID) | ORAL | Status: DC | PRN
  Administered 2022-05-17: 50 mg via ORAL
  Filled 2022-05-17: qty 1

## 2022-05-17 MED ORDER — FENTANYL CITRATE (PF) 100 MCG/2ML IJ SOLN
INTRAMUSCULAR | Status: AC
  Administered 2022-05-17: 100 ug via INTRAVENOUS
  Filled 2022-05-17: qty 2

## 2022-05-17 MED ORDER — CALCIUM CARBONATE 1500 (600 CA) MG PO TABS: 600.0000 mg | ORAL_TABLET | Freq: Every day | ORAL | Status: DC

## 2022-05-17 MED ORDER — DIPHENHYDRAMINE HCL 12.5 MG/5ML PO ELIX: 12.5000 mg | ORAL_SOLUTION | Freq: Four times a day (QID) | ORAL | Status: DC | PRN

## 2022-05-17 MED ORDER — VANCOMYCIN HCL IN DEXTROSE 1-5 GM/200ML-% IV SOLN
1000.0000 mg | INTRAVENOUS | Status: AC
  Administered 2022-05-17: 1000 mg via INTRAVENOUS
  Filled 2022-05-17: qty 200

## 2022-05-17 MED ORDER — PHENYLEPHRINE 80 MCG/ML (10ML) SYRINGE FOR IV PUSH (FOR BLOOD PRESSURE SUPPORT)
PREFILLED_SYRINGE | INTRAVENOUS | Status: DC | PRN
  Administered 2022-05-17: 80 ug via INTRAVENOUS

## 2022-05-17 MED ORDER — DOCUSATE SODIUM 100 MG PO CAPS
100.0000 mg | ORAL_CAPSULE | Freq: Two times a day (BID) | ORAL | Status: DC
  Administered 2022-05-17: 100 mg via ORAL
  Filled 2022-05-17: qty 1

## 2022-05-17 MED ORDER — POTASSIUM CHLORIDE IN NACL 20-0.9 MEQ/L-% IV SOLN
INTRAVENOUS | Status: DC
  Filled 2022-05-17: qty 1000

## 2022-05-17 MED ORDER — FENTANYL CITRATE (PF) 250 MCG/5ML IJ SOLN
INTRAMUSCULAR | Status: AC
  Filled 2022-05-17: qty 5

## 2022-05-17 MED ORDER — DEXAMETHASONE SODIUM PHOSPHATE 10 MG/ML IJ SOLN
INTRAMUSCULAR | Status: DC | PRN
  Administered 2022-05-17: 4 mg via INTRAVENOUS

## 2022-05-17 MED ORDER — SIMETHICONE 80 MG PO CHEW: 40.0000 mg | CHEWABLE_TABLET | Freq: Four times a day (QID) | ORAL | Status: DC | PRN

## 2022-05-17 MED ORDER — ACETAMINOPHEN 500 MG PO TABS
1000.0000 mg | ORAL_TABLET | Freq: Once | ORAL | Status: AC
  Administered 2022-05-17: 1000 mg via ORAL

## 2022-05-17 MED ORDER — FENTANYL CITRATE (PF) 100 MCG/2ML IJ SOLN: 25.0000 ug | INTRAMUSCULAR | Status: DC | PRN

## 2022-05-17 MED ORDER — PROPOFOL 10 MG/ML IV BOLUS
INTRAVENOUS | Status: DC | PRN
  Administered 2022-05-17: 150 mg via INTRAVENOUS
  Administered 2022-05-17: 50 mg via INTRAVENOUS

## 2022-05-17 MED ORDER — ACETAMINOPHEN 650 MG RE SUPP: 650.0000 mg | Freq: Four times a day (QID) | RECTAL | Status: DC | PRN

## 2022-05-17 MED ORDER — BUPIVACAINE LIPOSOME 1.3 % IJ SUSP
INTRAMUSCULAR | Status: AC
  Filled 2022-05-17: qty 10

## 2022-05-17 MED ORDER — NITROGLYCERIN 0.4 MG SL SUBL: 0.4000 mg | SUBLINGUAL_TABLET | SUBLINGUAL | Status: DC | PRN

## 2022-05-17 MED ORDER — TRIMETHOPRIM 100 MG PO TABS
100.0000 mg | ORAL_TABLET | Freq: Every day | ORAL | Status: DC
  Filled 2022-05-17 (×2): qty 1

## 2022-05-17 MED ORDER — CHLORHEXIDINE GLUCONATE 0.12 % MT SOLN
15.0000 mL | Freq: Once | OROMUCOSAL | Status: AC
  Administered 2022-05-17: 15 mL via OROMUCOSAL
  Filled 2022-05-17: qty 15

## 2022-05-17 MED ORDER — ACETAMINOPHEN 325 MG PO TABS: 650.0000 mg | ORAL_TABLET | Freq: Four times a day (QID) | ORAL | Status: DC | PRN

## 2022-05-17 MED ORDER — ONDANSETRON 4 MG PO TBDP: 4.0000 mg | ORAL_TABLET | Freq: Four times a day (QID) | ORAL | Status: DC | PRN

## 2022-05-17 MED ORDER — BUPIVACAINE HCL (PF) 0.25 % IJ SOLN
INTRAMUSCULAR | Status: DC | PRN
  Administered 2022-05-17: 20 mL

## 2022-05-17 MED ORDER — CALCIUM CARBONATE 1250 (500 CA) MG PO TABS: 1.0000 | ORAL_TABLET | Freq: Every day | ORAL | Status: DC

## 2022-05-17 MED ORDER — ATORVASTATIN CALCIUM 40 MG PO TABS
40.0000 mg | ORAL_TABLET | Freq: Every day | ORAL | Status: DC
  Administered 2022-05-17: 40 mg via ORAL
  Filled 2022-05-17: qty 1

## 2022-05-17 MED ORDER — BUPROPION HCL ER (SR) 150 MG PO TB12
150.0000 mg | ORAL_TABLET | Freq: Every day | ORAL | Status: DC
  Filled 2022-05-17 (×2): qty 1

## 2022-05-17 MED ORDER — ALBUTEROL SULFATE (2.5 MG/3ML) 0.083% IN NEBU: 3.0000 mL | INHALATION_SOLUTION | Freq: Four times a day (QID) | RESPIRATORY_TRACT | Status: DC | PRN

## 2022-05-17 MED ORDER — AMISULPRIDE (ANTIEMETIC) 5 MG/2ML IV SOLN: 10.0000 mg | Freq: Once | INTRAVENOUS | Status: DC | PRN

## 2022-05-17 MED ORDER — OXYCODONE HCL 5 MG PO TABS
5.0000 mg | ORAL_TABLET | ORAL | Status: DC | PRN
  Administered 2022-05-17 – 2022-05-18 (×3): 10 mg via ORAL
  Filled 2022-05-17 (×3): qty 2

## 2022-05-17 MED ORDER — PROPOFOL 500 MG/50ML IV EMUL
INTRAVENOUS | Status: DC | PRN
  Administered 2022-05-17: 50 ug/kg/min via INTRAVENOUS

## 2022-05-17 MED ORDER — PROMETHAZINE HCL 25 MG/ML IJ SOLN: 6.2500 mg | INTRAMUSCULAR | Status: DC | PRN

## 2022-05-17 MED ORDER — LACTATED RINGERS IV SOLN: INTRAVENOUS | Status: DC

## 2022-05-17 MED ORDER — MORPHINE SULFATE (PF) 4 MG/ML IV SOLN: 4.0000 mg | INTRAVENOUS | Status: DC | PRN

## 2022-05-17 MED ORDER — ONDANSETRON HCL 4 MG/2ML IJ SOLN
INTRAMUSCULAR | Status: DC | PRN
  Administered 2022-05-17: 4 mg via INTRAVENOUS

## 2022-05-17 MED ORDER — ACETAMINOPHEN 500 MG PO TABS
1000.0000 mg | ORAL_TABLET | ORAL | Status: DC
  Filled 2022-05-17: qty 2

## 2022-05-17 MED ORDER — BUPIVACAINE LIPOSOME 1.3 % IJ SUSP
INTRAMUSCULAR | Status: DC | PRN
  Administered 2022-05-17: 10 mL

## 2022-05-17 MED ORDER — PROPOFOL 10 MG/ML IV BOLUS
INTRAVENOUS | Status: AC
  Filled 2022-05-17: qty 20

## 2022-05-17 MED ORDER — TIOTROPIUM BROMIDE MONOHYDRATE 18 MCG IN CAPS: 18.0000 ug | ORAL_CAPSULE | Freq: Every evening | RESPIRATORY_TRACT | Status: DC

## 2022-05-17 MED ORDER — PANTOPRAZOLE SODIUM 40 MG PO TBEC: 40.0000 mg | DELAYED_RELEASE_TABLET | Freq: Every day | ORAL | Status: DC

## 2022-05-17 MED ORDER — PHENYLEPHRINE HCL-NACL 20-0.9 MG/250ML-% IV SOLN
INTRAVENOUS | Status: DC | PRN
  Administered 2022-05-17: 15 ug/min via INTRAVENOUS

## 2022-05-17 MED ORDER — DIPHENHYDRAMINE HCL 50 MG/ML IJ SOLN: 12.5000 mg | Freq: Four times a day (QID) | INTRAMUSCULAR | Status: DC | PRN

## 2022-05-17 MED ORDER — FENTANYL CITRATE (PF) 250 MCG/5ML IJ SOLN
INTRAMUSCULAR | Status: DC | PRN
  Administered 2022-05-17: 50 ug via INTRAVENOUS

## 2022-05-17 MED ORDER — SERTRALINE HCL 100 MG PO TABS
100.0000 mg | ORAL_TABLET | Freq: Every day | ORAL | Status: DC
  Administered 2022-05-17: 100 mg via ORAL
  Filled 2022-05-17: qty 1

## 2022-05-17 SURGICAL SUPPLY — 56 items
APL PRP STRL LF DISP 70% ISPRP (MISCELLANEOUS) ×1
APL SKNCLS STERI-STRIP NONHPOA (GAUZE/BANDAGES/DRESSINGS) ×1
APPLIER CLIP 9.375 MED OPEN (MISCELLANEOUS) ×1
APR CLP MED 9.3 20 MLT OPN (MISCELLANEOUS) ×1
BAG COUNTER SPONGE SURGICOUNT (BAG) ×1 IMPLANT
BAG SPNG CNTER NS LX DISP (BAG) ×1
BENZOIN TINCTURE PRP APPL 2/3 (GAUZE/BANDAGES/DRESSINGS) IMPLANT
BINDER BREAST LRG (GAUZE/BANDAGES/DRESSINGS) IMPLANT
BINDER BREAST XLRG (GAUZE/BANDAGES/DRESSINGS) IMPLANT
BINDER BREAST XXLRG (GAUZE/BANDAGES/DRESSINGS) IMPLANT
BIOPATCH RED 1 DISK 7.0 (GAUZE/BANDAGES/DRESSINGS) IMPLANT
CANISTER SUCT 3000ML PPV (MISCELLANEOUS) ×1 IMPLANT
CHLORAPREP W/TINT 26 (MISCELLANEOUS) ×1 IMPLANT
CLIP APPLIE 9.375 MED OPEN (MISCELLANEOUS) ×1 IMPLANT
CNTNR URN SCR LID CUP LEK RST (MISCELLANEOUS) ×1 IMPLANT
CONT SPEC 4OZ STRL OR WHT (MISCELLANEOUS)
COVER PROBE W GEL 5X96 (DRAPES) ×1 IMPLANT
COVER SURGICAL LIGHT HANDLE (MISCELLANEOUS) ×1 IMPLANT
DRAIN CHANNEL 19F RND (DRAIN) ×1 IMPLANT
DRAPE CHEST BREAST 15X10 FENES (DRAPES) ×1 IMPLANT
DRSG TEGADERM 4X4.75 (GAUZE/BANDAGES/DRESSINGS) IMPLANT
ELECT BLADE 4.0 EZ CLEAN MEGAD (MISCELLANEOUS) ×1
ELECT CAUTERY BLADE 6.4 (BLADE) ×1 IMPLANT
ELECT REM PT RETURN 9FT ADLT (ELECTROSURGICAL) ×1
ELECTRODE BLDE 4.0 EZ CLN MEGD (MISCELLANEOUS) ×1 IMPLANT
ELECTRODE REM PT RTRN 9FT ADLT (ELECTROSURGICAL) ×1 IMPLANT
EVACUATOR SILICONE 100CC (DRAIN) ×1 IMPLANT
GAUZE PAD ABD 8X10 STRL (GAUZE/BANDAGES/DRESSINGS) ×2 IMPLANT
GAUZE SPONGE 4X4 12PLY STRL (GAUZE/BANDAGES/DRESSINGS) ×1 IMPLANT
GLOVE BIO SURGEON STRL SZ7 (GLOVE) ×1 IMPLANT
GLOVE BIOGEL PI IND STRL 7.5 (GLOVE) ×1 IMPLANT
GOWN STRL REUS W/ TWL LRG LVL3 (GOWN DISPOSABLE) ×2 IMPLANT
GOWN STRL REUS W/TWL LRG LVL3 (GOWN DISPOSABLE) ×5
HEMOSTAT ARISTA ABSORB 3G PWDR (HEMOSTASIS) IMPLANT
KIT BASIN OR (CUSTOM PROCEDURE TRAY) ×1 IMPLANT
KIT TURNOVER KIT B (KITS) ×1 IMPLANT
NDL 18GX1X1/2 (RX/OR ONLY) (NEEDLE) ×1 IMPLANT
NDL FILTER BLUNT 18X1 1/2 (NEEDLE) ×1 IMPLANT
NDL HYPO 25GX1X1/2 BEV (NEEDLE) ×1 IMPLANT
NEEDLE 18GX1X1/2 (RX/OR ONLY) (NEEDLE) IMPLANT
NEEDLE FILTER BLUNT 18X1 1/2 (NEEDLE) IMPLANT
NEEDLE HYPO 25GX1X1/2 BEV (NEEDLE) IMPLANT
NS IRRIG 1000ML POUR BTL (IV SOLUTION) ×1 IMPLANT
PACK GENERAL/GYN (CUSTOM PROCEDURE TRAY) ×1 IMPLANT
PAD ARMBOARD 7.5X6 YLW CONV (MISCELLANEOUS) ×1 IMPLANT
PENCIL SMOKE EVACUATOR (MISCELLANEOUS) ×1 IMPLANT
SPECIMEN JAR X LARGE (MISCELLANEOUS) ×1 IMPLANT
STAPLER VISISTAT 35W (STAPLE) ×1 IMPLANT
STRIP CLOSURE SKIN 1/2X4 (GAUZE/BANDAGES/DRESSINGS) IMPLANT
SUT ETHILON 2 0 FS 18 (SUTURE) ×1 IMPLANT
SUT MNCRL AB 4-0 PS2 18 (SUTURE) ×1 IMPLANT
SUT SILK 2 0 PERMA HAND 18 BK (SUTURE) ×1 IMPLANT
SUT VIC AB 3-0 SH 18 (SUTURE) ×1 IMPLANT
SYR CONTROL 10ML LL (SYRINGE) ×1 IMPLANT
TOWEL GREEN STERILE (TOWEL DISPOSABLE) ×1 IMPLANT
TOWEL GREEN STERILE FF (TOWEL DISPOSABLE) ×1 IMPLANT

## 2022-05-17 NOTE — Discharge Instructions (Signed)
[facility] [place] [phone]  MASTECTOMY: POST OP INSTRUCTIONS  Always review your discharge instruction sheet given to you by the facility where your surgery was performed. IF YOU HAVE DISABILITY OR FAMILY LEAVE FORMS, YOU MUST BRING THEM TO THE OFFICE FOR PROCESSING.   DO NOT GIVE THEM TO YOUR DOCTOR. A prescription for pain medication may be given to you upon discharge.  Take your pain medication as prescribed, if needed.  If narcotic pain medicine is not needed, then you may take acetaminophen (Tylenol) or ibuprofen (Advil) as needed. Take your usually prescribed medications unless otherwise directed. If you need a refill on your pain medication, please contact your pharmacy.  They will contact our office to request authorization.  Prescriptions will not be filled after 5pm or on week-ends. You should follow a light diet the first few days after arrival home, such as soup and crackers, etc.  Resume your normal diet the day after surgery. Most patients will experience some swelling and bruising on the chest and underarm.  Ice packs will help.  Swelling and bruising can take several days to resolve.  It is common to experience some constipation if taking pain medication after surgery.  Increasing fluid intake and taking a stool softener (such as Colace) will usually help or prevent this problem from occurring.  A mild laxative (Milk of Magnesia or Miralax) should be taken according to package instructions if there are no bowel movements after 48 hours. Unless discharge instructions indicate otherwise, leave your bandage dry and in place until your next appointment in 3-5 days.  You may take a limited sponge bath.  No tube baths or showers until the drains are removed.  You may have steri-strips (small skin tapes) in place directly over the incision.  These strips should be left on the skin for 7-10 days.  If your surgeon used skin glue on the incision, you may shower in 24 hours.   The glue will flake off over the next 2-3 weeks.  Any sutures or staples will be removed at the office during your follow-up visit. DRAINS:  If you have drains in place, it is important to keep a list of the amount of drainage produced each day in your drains.  Before leaving the hospital, you should be instructed on drain care.  Call our office if you have any questions about your drains. ACTIVITIES:  You may resume regular (light) daily activities beginning the next day--such as daily self-care, walking, climbing stairs--gradually increasing activities as tolerated.  You may have sexual intercourse when it is comfortable.  Refrain from any heavy lifting or straining until approved by your doctor. You may drive when you are no longer taking prescription pain medication, you can comfortably wear a seatbelt, and you can safely maneuver your car and apply brakes. RETURN TO WORK:  __________________________________________________________ You should see your doctor in the office for a follow-up appointment approximately 3-5 days after your surgery.  Your doctor's nurse will typically make your follow-up appointment when she calls you with your pathology report.  Expect your pathology report 2-3 business days after your surgery.  You may call to check if you do not hear from us after three days.   OTHER INSTRUCTIONS: ______________________________________________________________________________________________ ____________________________________________________________________________________________ WHEN TO CALL YOUR DOCTOR: Fever over 101.0 Nausea and/or vomiting Extreme swelling or bruising Continued bleeding from incision. Increased pain, redness, or drainage from the incision. The clinic staff is available to answer your questions during regular business hours.  Please don't hesitate   to call and ask to speak to one of the nurses for clinical concerns.  If you have a medical emergency, go to the  nearest emergency room or call 911.  A surgeon from [facility] is always on call at the hospital. [address], [address], [place], [place]  [address] ? [address] [address], [place], [place]   [address] [phone] ? [phone] ? FAX [phone] Web site: [email or web address]  

## 2022-05-17 NOTE — Anesthesia Postprocedure Evaluation (Signed)
Anesthesia Post Note  Patient: Brandy Paul  Procedure(s) Performed: RIGHT MASTECTOMY (Right: Breast)     Patient location during evaluation: PACU Anesthesia Type: General Level of consciousness: sedated Pain management: pain level controlled Vital Signs Assessment: post-procedure vital signs reviewed and stable Respiratory status: spontaneous breathing and respiratory function stable Cardiovascular status: stable Postop Assessment: no apparent nausea or vomiting Anesthetic complications: no   No notable events documented.  Last Vitals:  Vitals:   05/17/22 1600 05/17/22 1629  BP: 138/73 123/62  Pulse: 69 71  Resp: 12 16  Temp: 36.6 C 36.4 C  SpO2: 100% 96%    Last Pain:  Vitals:   05/17/22 1629  TempSrc: Oral  PainSc: 0-No pain                 Connelly Spruell DANIEL

## 2022-05-17 NOTE — Transfer of Care (Signed)
Immediate Anesthesia Transfer of Care Note  Patient: [name]  Procedure(s) Performed: RIGHT MASTECTOMY (Right: Breast)  Patient Location: PACU  Anesthesia Type:General  Level of Consciousness: awake, alert , and oriented  Airway & Oxygen Therapy: Patient Spontanous Breathing and Patient connected to face mask oxygen  Post-op Assessment: Report given to RN and Post -op Vital signs reviewed and stable  Post vital signs: Reviewed and stable  Last Vitals:  Vitals Value Taken Time  BP 126/85 05/17/22 1530  Temp    Pulse 70 05/17/22 1530  Resp 17 05/17/22 1531  SpO2 98 % 05/17/22 1530  Vitals shown include unvalidated device data.  Last Pain:  Vitals:   05/17/22 1233  TempSrc:   PainSc: 0-No pain         Complications: No notable events documented.

## 2022-05-17 NOTE — Anesthesia Procedure Notes (Signed)
Procedure Name: LMA Insertion Date/Time: 05/17/2022 2:00 PM  Performed by: Wilburn Cornelia, CRNAPre-anesthesia Checklist: Patient identified, Emergency Drugs available, Suction available, Patient being monitored and Timeout performed Patient Re-evaluated:Patient Re-evaluated prior to induction Oxygen Delivery Method: Circle system utilized Preoxygenation: Pre-oxygenation with 100% oxygen Induction Type: IV induction Ventilation: Mask ventilation without difficulty LMA: LMA inserted LMA Size: 4.0 Number of attempts: 1 Placement Confirmation: positive ETCO2 and breath sounds checked- equal and bilateral Tube secured with: Tape Dental Injury: Teeth and Oropharynx as per pre-operative assessment

## 2022-05-17 NOTE — Anesthesia Procedure Notes (Signed)
Anesthesia Regional Block: Pectoralis block   Pre-Anesthetic Checklist: , timeout performed,  Correct Patient, Correct Site, Correct Laterality,  Correct Procedure, Correct Position, site marked,  Risks and benefits discussed,  Surgical consent,  Pre-op evaluation,  At surgeon's request and post-op pain management  Laterality: Right  Prep: chloraprep       Needles:  Injection technique: Single-shot  Needle Type: Echogenic Stimulator Needle     Needle Length: 10cm  Needle Gauge: 21     Additional Needles:   Procedures:,,,, ultrasound used (permanent image in chart),,    Narrative:  Start time: 05/17/2022 1:11 PM End time: 05/17/2022 1:21 PM Injection made incrementally with aspirations every 5 mL.  Performed by: Personally

## 2022-05-17 NOTE — Care Plan (Signed)
Patient arrived to room 7 via bed accompanied by PACU Rns.  She is A&Ox3.  She has noticeable discoloration of left eye, husband states she rubs that eye a lot causing the bruising effect.  Dressing to R Breast is CDI, no drainage noted, JP bulb noted draining scant serosanguinous fluid.  Lungs clear with exception of expiratory wheeze noted.  Husband states she does use an inhaler at night.  RRR, Purewick noted and set to suction.  NADN. Sreece, RN

## 2022-05-17 NOTE — Op Note (Addendum)
Pre-op diagnosis: Multifocal invasive lobular carcinoma right breast Postop diagnosis: Same Procedure performed: Right mastectomy Surgeon:Christabella Alvira K Keidan Aumiller Assistant:  Ewell Poe - RNFA Anesthesia: General with pec block Indications: This is a 77 year old female with a very complex past medical history including recent stroke with significant residual hemiparesis.  She has had a previous left mastectomy for Paget's disease.  She presented with multiple palpable masses in the right breast.  2 of these masses were biopsied and both revealed invasive lobular carcinoma.  She is not a good candidate for adjuvant chemotherapy.  We recommended right mastectomy.  There is no strong indication for sentinel lymph node biopsy as it is unlikely to change her adjuvant treatment.  Description of procedure: Patient is brought to the operating room placed in supine position on the operating table.  After adequate level general anesthesia was obtained, her right chest was prepped with ChloraPrep and draped sterile fashion.  A timeout was taken to ensure the proper patient and proper procedure.  The patient has a firm palpable mass in the central breast in the retroareolar space.  There are no skin changes.  We outlined a transverse elliptical incision to include the entire nipple areolar complex.  We made an skin incision with a #10 blade.  We began with superior flap.  We dissected superiorly to the infraclavicular chest wall.  Medially we dissected to the lateral edge of the sternum.  Laterally we dissected to the anterior edge of the latissimus muscle.  We made our inferior skin incision and dissected down to the inframammary crease.  Once we had completed our flap dissection, the breast tissue was dissected off of the underlying pectoralis muscle from medial to lateral.  Several large veins were ligated with clips.  The specimen was oriented with a long suture lateral and a short suture superior.  This was sent for  pathologic examination.  We irrigated the wound thoroughly.  Cautery was used for hemostasis.  We sprayed Arista powder throughout the entire wound.  A 19 French drain was inserted through a stab incision laterally.  This was secured with a 2-0 Ethilon suture.  We placed this along the inframammary crease.  We again inspected for hemostasis.  The wound was closed with multiple interrupted 3-0 Vicryl sutures in the deep dermal plane.  Several of the sutures were used to pull the dermis down to the chest wall.  4-0 Monocryl was used in subcuticular fashion close the skin.  Benzoin and Steri-Strips were applied.  The bulb was attached to the drain which was placed to suction.  A dry dressing is applied.  The patient was extubated and brought to recovery in stable condition.  All sponge, instrument, and needle counts are correct.  Imogene Burn. Georgette Dover, MD, [facility]  General Surgery   05/17/2022 3:08 PM

## 2022-05-17 NOTE — H&P (Addendum)
Subjective    Chief Complaint: New Consultation (Breast cancer)   Cardiology - [facility] Tamala Julian)   History of Present Illness: Brandy Paul is a 77 y.o. female who is seen today as an office consultation at the request of Dr. Mardelle Matte for evaluation of New Consultation (Breast cancer) .   This is a 77 year old female with a very complex medical history who presents with a new diagnosis of invasive lobular carcinoma of the right breast.  The patient is wheelchair-bound after stroke about 11 years ago.  In 2003, the patient underwent a left mastectomy for Paget's disease, as well as right breast reduction.  Surgery was performed at [facility].  She had reconstruction of the left mastectomy with a tissue expander.  However, her reconstruction was never completed.  She still has a tissue expander in place that has never been fully filled or replaced.   Over the last several months, the patient has noted firmness and retraction of the right nipple.  She underwent evaluation with mammogram and ultrasound.  In the right breast at 9:00 in the retroareolar region, there is a 3.4 x 1.4 x 2.2 cm mass.  At 1:00 located 1 cm from the nipple there is a 1.0 x 1.0 x 0.7 cm mass.  She underwent biopsies of both of these areas.  The larger mass at 9:00 revealed invasive lobular carcinoma grade 1 ER positive PR negative Ki-67 40% HER2 pending.  The smaller mass located at 1:00 revealed invasive lobular carcinoma grade 2 ER/PR positive, Ki-67 40% HER2 pending.  Ultrasound of the axilla was limited due to immobility but showed no enlarged axillary lymph nodes.  She is accompanied by her husband today.   She is scheduled to see cardiology next week.  She remains anticoagulated on Plavix.   She underwent laparoscopic cholecystectomy and laparoscopic appendectomy by Dr. Hassell Done in 2019.   Review of Systems: A complete review of systems was obtained from the patient.  I have reviewed this information and  discussed as appropriate with the patient.  See HPI as well for other ROS.   Review of Systems  Constitutional: Negative.   HENT: Negative.    Eyes: Negative.   Respiratory: Negative.    Cardiovascular: Negative.   Gastrointestinal: Negative.   Genitourinary: Negative.   Musculoskeletal: Negative.   Skin: Negative.   Neurological: Negative.   Endo/Heme/Allergies: Negative.   Psychiatric/Behavioral: Negative.          Medical History: Past Medical History         Past Medical History:  Diagnosis Date   CHF (congestive heart failure) (CMS-HCC)     COPD (chronic obstructive pulmonary disease) (CMS-HCC)     History of cancer     History of stroke               Patient Active Problem List  Diagnosis   Invasive lobular carcinoma of right breast in female (CMS-HCC)   Centrilobular emphysema (CMS-HCC)   COPD (chronic obstructive pulmonary disease) (CMS-HCC)   Coronary artery spasm (CMS-HCC)   DOE (dyspnea on exertion)   Essential hypertension   Encounter for long-term (current) use of antiplatelets/antithrombotics   Gastroesophageal reflux disease without esophagitis   Hemiparesis affecting right side as late effect of cerebrovascular accident (CMS-HCC)   TIA (transient ischemic attack)      Past Surgical History           Past Surgical History:  Procedure Laterality Date   APPENDECTOMY  CHOLECYSTECTOMY       HYSTERECTOMY       MASTECTOMY BILATERAL SIMPLE            Allergies           Allergies  Allergen Reactions   Cefaclor Other (See Comments) and Unknown      REACTION: "SEVERE HEADACHE"                   Current Outpatient Medications on File Prior to Visit  Medication Sig Dispense Refill   atorvastatin (LIPITOR) 40 MG tablet Take 40 mg by mouth once daily       buPROPion (WELLBUTRIN SR) 150 MG SR tablet Take 150 mg by mouth once daily       calcium carbonate 600 mg calcium (1,500 mg) Tab tablet Take by mouth       clopidogreL (PLAVIX) 75 mg  tablet Take 75 mg by mouth once daily       ezetimibe (ZETIA) 10 mg tablet Take 10 mg by mouth once daily       isosorbide mononitrate (IMDUR) 60 MG ER tablet Take 60 mg by mouth once daily       pantoprazole (PROTONIX) 40 MG DR tablet         sertraline (ZOLOFT) 100 MG tablet         tiotropium (SPIRIVA) 18 mcg inhalation capsule INHALE THE CONTENTS OF 1 CAPSULE VIA INHALATION DEVICE EVERY DAY AS DIRECTED        No current facility-administered medications on file prior to visit.      Family History           Family History  Problem Relation Age of Onset   Obesity Sister     High blood pressure (Hypertension) Sister     Hyperlipidemia (Elevated cholesterol) Sister     Coronary Artery Disease (Blocked arteries around heart) Sister     Diabetes Sister          Social History           Tobacco Use  Smoking Status Former   Types: Cigarettes  Smokeless Tobacco Never      Social History  Social History             Socioeconomic History   Marital status: Married  Tobacco Use   Smoking status: Former      Types: Cigarettes   Smokeless tobacco: Never  Vaping Use   Vaping Use: Never used  Substance and Sexual Activity   Alcohol use: Not Currently   Drug use: Never        Objective:             Vitals:    04/14/22 1109 04/14/22 1112  BP: 132/64    Pulse: 65    Temp: 36.4 C (97.6 F)    SpO2: 92%    PainSc:   0-No pain        Physical Exam    Constitutional:  WDWN in NAD, slowly conversant, no obvious deformities; wheelchair-bound Eyes:  Pupils equal, round; sclera anicteric; moist conjunctiva; no lid lag HENT:  Oral mucosa moist; good dentition  Neck:  No masses palpated, trachea midline; no thyromegaly Lungs:  CTA bilaterally; normal respiratory effort Breasts: Left breast is status postmastectomy.  There is evidence of reconstruction with a small breast mound.  No palpable masses or axillary lymphadenopathy on the left Right breast shows some obvious  nipple retraction.  There is firm palpable mass in the retroareolar  space and upper breast.  No palpable lymphadenopathy in the axilla. CV:  Regular rate and rhythm; no murmurs; extremities well-perfused with no edema Abd:  +bowel sounds, soft, non-tender, no palpable organomegaly; no palpable hernias Musc:  Unable to assess gait; no apparent clubbing or cyanosis in extremities Lymphatic:  No palpable cervical or axillary lymphadenopathy Skin:  Warm, dry; no sign of jaundice Psychiatric - alert and oriented x 4; calm mood and affect     Labs, Imaging and Diagnostic Testing: Narrative & Impression  CLINICAL DATA:  77 year old female with history of left breast mastectomy in 2005 for Paget's disease. She has had a remote reduction of the right breast. She has recent history of right breast intermittent pain and nipple inversion. The patient has had strokes which affects her right side and is in a wheelchair and her mobility. Only a right CC mammographic image was able to be obtained. She has prior films from [place], however her husband does no at what facility.   EXAM: DIGITAL DIAGNOSTIC UNILATERAL RIGHT MAMMOGRAM WITH TOMOSYNTHESIS; ULTRASOUND RIGHT BREAST LIMITED   TECHNIQUE: Right digital diagnostic mammography and breast tomosynthesis was performed.; Targeted ultrasound examination of the right breast was performed   COMPARISON:  None available.   ACR Breast Density Category b: There are scattered areas of fibroglandular density.   FINDINGS: The posterior most tissues in the right breast could not be included within the field of view due to the patient's limited mobility. There is a small asymmetry in the medial retroareolar right breast measuring approximately 1 cm. The nipple does appear to be inverted. No other suspicious calcifications, masses or areas of distortion are seen in the right breast.   Ultrasound targeted to the right breast at 1 o'clock, 1 cm from  the nipple demonstrates an irregular hypoechoic mass with indistinct margins measuring 1.0 x 1.0 x 0.7 cm. There is hypoechoic shadowing tissue immediately deep to the nipple image at 9 o'clock in the retroareolar location spanning approximately 3.4 x 1.4 x 2.2 cm. Imaging of the right axilla is limited due to the patient's mobility limitations. Visualized lymph nodes are normal.   IMPRESSION: 1. There is an indeterminate 1.0 cm mass in the right breast at 1 o'clock.   2. There is indeterminate 3.4 cm area of shadowing tissue in the retroareolar right breast at 9 o'clock.   3. Limited evaluation of the axilla demonstrates normal lymph nodes.   RECOMMENDATION: Ultrasound guided biopsy is recommended for the right breast mass at 1 o'clock and the shadowing tissue in the retroareolar right breast at 9 o'clock. The procedures have been scheduled for 04/05/2022 at 1:45 p.m.   I have discussed the findings and recommendations with the patient. If applicable, a reminder letter will be sent to the patient regarding the next appointment.   BI-RADS CATEGORY  4: Suspicious.     Electronically Signed   By: Ammie Ferrier M.D.   On: 03/31/2022 15:52      Assessment and Plan:  Diagnoses and all orders for this visit:   Invasive lobular carcinoma of right breast in female (CMS-HCC)   The patient has 2 separate areas of invasive lobular carcinoma in the right breast.  These are not immediately adjacent.  The mass located at 9:00 is fairly large.  The patient is not a good candidate for neoadjuvant chemotherapy due to her multiple medical comorbidities.  We recommend right mastectomy.   No indications for sentinel lymph node biopsy as it is unlikely to change her  treatment plan.  The surgical procedure has been discussed with the patient.  Potential risks, benefits, alternative treatments, and expected outcomes have been explained.  All of the patient's questions at this time have been  answered.  The likelihood of reaching the patient's treatment goal is good.  The patient understand the proposed surgical procedure and wishes to proceed.    Imogene Burn. Georgette Dover, MD, [facility] Surgery  General Surgery   05/17/2022 12:26 PM

## 2022-05-18 ENCOUNTER — Encounter (HOSPITAL_COMMUNITY): Payer: Self-pay | Admitting: Surgery

## 2022-05-18 DIAGNOSIS — Z8673 Personal history of transient ischemic attack (TIA), and cerebral infarction without residual deficits: Secondary | ICD-10-CM | POA: Diagnosis not present

## 2022-05-18 DIAGNOSIS — Z79899 Other long term (current) drug therapy: Secondary | ICD-10-CM | POA: Diagnosis not present

## 2022-05-18 DIAGNOSIS — I110 Hypertensive heart disease with heart failure: Secondary | ICD-10-CM | POA: Diagnosis not present

## 2022-05-18 DIAGNOSIS — I509 Heart failure, unspecified: Secondary | ICD-10-CM | POA: Diagnosis not present

## 2022-05-18 DIAGNOSIS — J449 Chronic obstructive pulmonary disease, unspecified: Secondary | ICD-10-CM | POA: Diagnosis not present

## 2022-05-18 DIAGNOSIS — C50911 Malignant neoplasm of unspecified site of right female breast: Secondary | ICD-10-CM | POA: Diagnosis not present

## 2022-05-18 LAB — BASIC METABOLIC PANEL
Anion gap: 6 (ref 5–15)
BUN: 14 mg/dL (ref 8–23)
CO2: 28 mmol/L (ref 22–32)
Calcium: 8.2 mg/dL — ABNORMAL LOW (ref 8.9–10.3)
Chloride: 102 mmol/L (ref 98–111)
Creatinine, Ser: 1.21 mg/dL — ABNORMAL HIGH (ref 0.44–1.00)
GFR, Estimated: 46 mL/min — ABNORMAL LOW (ref >60)
Glucose, Bld: 141 mg/dL — ABNORMAL HIGH (ref 70–99)
Potassium: 4.4 mmol/L (ref 3.5–5.1)
Sodium: 136 mmol/L (ref 135–145)

## 2022-05-18 LAB — CBC
HCT: 23.7 % — ABNORMAL LOW (ref 36.0–46.0)
HCT: 21.7 % — ABNORMAL LOW (ref 36.0–46.0)
Hemoglobin: 6.7 g/dL — CL (ref 12.0–15.0)
Hemoglobin: 6.3 g/dL — CL (ref 12.0–15.0)
MCH: 19.3 pg — ABNORMAL LOW (ref 26.0–34.0)
MCH: 19.7 pg — ABNORMAL LOW (ref 26.0–34.0)
MCHC: 28.3 g/dL — ABNORMAL LOW (ref 30.0–36.0)
MCHC: 29 g/dL — ABNORMAL LOW (ref 30.0–36.0)
MCV: 68.1 fL — ABNORMAL LOW (ref 80.0–100.0)
MCV: 68 fL — ABNORMAL LOW (ref 80.0–100.0)
Platelets: 207 10*3/uL (ref 150–400)
Platelets: 189 10*3/uL (ref 150–400)
RBC: 3.48 MIL/uL — ABNORMAL LOW (ref 3.87–5.11)
RBC: 3.19 MIL/uL — ABNORMAL LOW (ref 3.87–5.11)
RDW: 21.9 % — ABNORMAL HIGH (ref 11.5–15.5)
RDW: 21.8 % — ABNORMAL HIGH (ref 11.5–15.5)
WBC: 9.7 10*3/uL (ref 4.0–10.5)
WBC: 9 10*3/uL (ref 4.0–10.5)
nRBC: 0 % (ref 0.0–0.2)
nRBC: 0 % (ref 0.0–0.2)

## 2022-05-18 LAB — PREPARE RBC (CROSSMATCH)

## 2022-05-18 MED ORDER — OXYCODONE HCL 5 MG PO TABS: 5.0000 mg | ORAL_TABLET | Freq: Four times a day (QID) | ORAL | 0 refills | Status: DC | PRN

## 2022-05-18 MED ORDER — SODIUM CHLORIDE 0.9% IV SOLUTION: Freq: Once | INTRAVENOUS | Status: AC

## 2022-05-18 NOTE — Progress Notes (Signed)
Discharge instructions reviewed with pt.  Copy of instructions given to pt. Pt informed her script was sent to her pharmacy for pick up.  Instructions reviewed by Juliann Pulse, RN SWOT.  Pt knows how to care for and empty her drain pt stated. Pt waiting for her husband to arrive to take her home.

## 2022-05-18 NOTE — Care Plan (Signed)
Blood transfusion complete.  No s/s of adverse reactions noted.  INT removed and pressure dressing applied.  Sreece, RN

## 2022-05-18 NOTE — Discharge Summary (Signed)
Physician Discharge Summary  Patient ID: Brandy Paul MRN: [id number] DOB/AGE: [birth year] 78 y.o.  Admit date: 05/17/2022 Discharge date: 05/18/2022  Admission Diagnoses:  Invasive lobular carcinoma right breast  Discharge Diagnoses: Same Principal Problem:   Invasive lobular carcinoma of breast, stage 1, right [facility])   Discharged Condition: good  Hospital Course: Right mastectomy on 05/17/22.  Her pre-op hemoglobin was 7.8.  The patient did well overnight with minimal pain.  Hgb 6.3 but hemodynamically stable.  Will transfuse one unit PRBC prior to discharge.  Consults: None  Significant Diagnostic Studies: labs: Hgb 6.3  Treatments: surgery: right mastectomy and blood transfusion  Discharge Exam: Blood pressure (!) 111/56, pulse 86, temperature 97.7 F (36.5 C), temperature source Oral, resp. rate 17, height '5\' 3"'$  (1.6 m), weight 93 kg, SpO2 96 %. General appearance: alert, cooperative, and no distress Right chest - incision intact, skin flaps viable; moderate drainage in JP bulb  Disposition: Discharge disposition: 01-Home or Self Care       Discharge Instructions     Call MD for:  persistant nausea and vomiting   Complete by: As directed    Call MD for:  redness, tenderness, or signs of infection (pain, swelling, redness, odor or green/yellow discharge around incision site)   Complete by: As directed    Call MD for:  severe uncontrolled pain   Complete by: As directed    Call MD for:  temperature >100.4   Complete by: As directed    Diet general   Complete by: As directed    Driving Restrictions   Complete by: As directed    Do not drive while taking pain medications   Increase activity slowly   Complete by: As directed    May shower / Bathe   Complete by: As directed       Allergies as of 05/18/2022       Reactions   Ceclor [cefaclor] Other (See Comments)   REACTION: "SEVERE HEADACHE"        Medication List     TAKE these medications     acetaminophen 500 MG tablet Commonly known as: TYLENOL Take 500 mg by mouth daily as needed for mild pain.   albuterol 108 (90 Base) MCG/ACT inhaler Commonly known as: VENTOLIN HFA Inhale 1-2 puffs into the lungs every 6 (six) hours as needed for wheezing or shortness of breath.   atorvastatin 40 MG tablet Commonly known as: LIPITOR Take 40 mg by mouth at bedtime.   buPROPion 150 MG 12 hr tablet Commonly known as: WELLBUTRIN SR Take 150 mg by mouth daily.   calcium carbonate 1500 (600 Ca) MG Tabs tablet Commonly known as: OSCAL Take 600 mg of elemental calcium by mouth daily.   clopidogrel 75 MG tablet Commonly known as: PLAVIX Take 75 mg by mouth daily.   cyanocobalamin 1000 MCG tablet Take 1 tablet (1,000 mcg total) by mouth daily.   ezetimibe 10 MG tablet Commonly known as: ZETIA Take 10 mg by mouth at bedtime.   ferrous sulfate 325 (65 FE) MG tablet Take 1 tablet (325 mg total) by mouth daily with breakfast.   Gemtesa 75 MG Tabs Generic drug: Vibegron Take 75 mg by mouth daily.   isosorbide mononitrate 60 MG 24 hr tablet Commonly known as: IMDUR Take 1 tablet (60 mg total) by mouth daily. Please make overdue appt with Dr. Tamala Julian before anymore refills. Thank you  3rd and final attempt   nitroGLYCERIN 0.4 MG SL tablet Commonly known as: NITROSTAT  Place 1 tablet (0.4 mg total) under the tongue every 5 (five) minutes as needed for chest pain (Call 911 at 3rd dose within 15 minutes).   oxyCODONE 5 MG immediate release tablet Commonly known as: Oxy IR/ROXICODONE Take 1 tablet (5 mg total) by mouth every 6 (six) hours as needed for moderate pain.   pantoprazole 40 MG tablet Commonly known as: PROTONIX Take 1 tablet (40 mg total) by mouth daily.   sertraline 100 MG tablet Commonly known as: ZOLOFT Take 100 mg by mouth at bedtime.   tiotropium 18 MCG inhalation capsule Commonly known as: SPIRIVA Place 18 mcg into inhaler and inhale every evening.    trimethoprim 100 MG tablet Commonly known as: TRIMPEX Take 100 mg by mouth daily.        Follow-up Information     Donnie Mesa, MD Follow up in 1 week(s).   Specialty: General Surgery Why: For drain removal Contact information: [address] [place] [id number] [phone]                 Signed: Maia Petties 05/18/2022, 7:31 AM

## 2022-05-18 NOTE — Care Plan (Signed)
Per order and patient consent, blood transfusion started.  Transfusion noted with no adverse s/s.  Sreece, RN

## 2022-05-19 LAB — TYPE AND SCREEN
ABO/RH(D): A POS
Antibody Screen: NEGATIVE
Unit division: 0

## 2022-05-19 LAB — BPAM RBC
Blood Product Expiration Date: 202403162359
ISSUE DATE / TIME: 202402290916
Unit Type and Rh: 6200

## 2022-05-19 LAB — SURGICAL PATHOLOGY

## 2022-06-13 ENCOUNTER — Encounter: Payer: Self-pay | Admitting: *Deleted

## 2022-06-13 DIAGNOSIS — C50111 Malignant neoplasm of central portion of right female breast: Secondary | ICD-10-CM

## 2022-06-19 ENCOUNTER — Ambulatory Visit: Payer: Medicare Other | Attending: Surgery

## 2022-06-19 DIAGNOSIS — C50111 Malignant neoplasm of central portion of right female breast: Secondary | ICD-10-CM | POA: Insufficient documentation

## 2022-06-19 DIAGNOSIS — M25611 Stiffness of right shoulder, not elsewhere classified: Secondary | ICD-10-CM | POA: Insufficient documentation

## 2022-06-19 DIAGNOSIS — Z170 Estrogen receptor positive status [ER+]: Secondary | ICD-10-CM | POA: Insufficient documentation

## 2022-06-19 DIAGNOSIS — R293 Abnormal posture: Secondary | ICD-10-CM | POA: Insufficient documentation

## 2022-06-22 DIAGNOSIS — M25562 Pain in left knee: Secondary | ICD-10-CM | POA: Diagnosis not present

## 2022-06-22 DIAGNOSIS — M1712 Unilateral primary osteoarthritis, left knee: Secondary | ICD-10-CM | POA: Diagnosis not present

## 2022-06-27 DIAGNOSIS — M25562 Pain in left knee: Secondary | ICD-10-CM | POA: Diagnosis not present

## 2022-07-06 DIAGNOSIS — N3944 Nocturnal enuresis: Secondary | ICD-10-CM | POA: Diagnosis not present

## 2022-07-06 DIAGNOSIS — N3946 Mixed incontinence: Secondary | ICD-10-CM | POA: Diagnosis not present

## 2022-07-06 DIAGNOSIS — R350 Frequency of micturition: Secondary | ICD-10-CM | POA: Diagnosis not present

## 2022-07-17 DIAGNOSIS — D519 Vitamin B12 deficiency anemia, unspecified: Secondary | ICD-10-CM | POA: Diagnosis not present

## 2022-07-17 DIAGNOSIS — I700 Atherosclerosis of aorta: Secondary | ICD-10-CM | POA: Diagnosis not present

## 2022-07-17 DIAGNOSIS — E785 Hyperlipidemia, unspecified: Secondary | ICD-10-CM | POA: Diagnosis not present

## 2022-07-17 DIAGNOSIS — E1169 Type 2 diabetes mellitus with other specified complication: Secondary | ICD-10-CM | POA: Diagnosis not present

## 2022-07-17 DIAGNOSIS — K219 Gastro-esophageal reflux disease without esophagitis: Secondary | ICD-10-CM | POA: Diagnosis not present

## 2022-07-17 DIAGNOSIS — D509 Iron deficiency anemia, unspecified: Secondary | ICD-10-CM | POA: Diagnosis not present

## 2022-07-24 ENCOUNTER — Other Ambulatory Visit (HOSPITAL_COMMUNITY): Payer: Self-pay

## 2022-07-24 DIAGNOSIS — D509 Iron deficiency anemia, unspecified: Secondary | ICD-10-CM | POA: Diagnosis not present

## 2022-07-24 DIAGNOSIS — Z1339 Encounter for screening examination for other mental health and behavioral disorders: Secondary | ICD-10-CM | POA: Diagnosis not present

## 2022-07-24 DIAGNOSIS — I700 Atherosclerosis of aorta: Secondary | ICD-10-CM | POA: Diagnosis not present

## 2022-07-24 DIAGNOSIS — D519 Vitamin B12 deficiency anemia, unspecified: Secondary | ICD-10-CM | POA: Diagnosis not present

## 2022-07-24 DIAGNOSIS — Z853 Personal history of malignant neoplasm of breast: Secondary | ICD-10-CM | POA: Diagnosis not present

## 2022-07-24 DIAGNOSIS — Z0000 Encounter for general adult medical examination without abnormal findings: Secondary | ICD-10-CM | POA: Diagnosis not present

## 2022-07-24 DIAGNOSIS — R32 Unspecified urinary incontinence: Secondary | ICD-10-CM | POA: Diagnosis not present

## 2022-07-24 DIAGNOSIS — I69961 Other paralytic syndrome following unspecified cerebrovascular disease affecting right dominant side: Secondary | ICD-10-CM | POA: Diagnosis not present

## 2022-07-24 DIAGNOSIS — R5383 Other fatigue: Secondary | ICD-10-CM | POA: Diagnosis not present

## 2022-07-24 DIAGNOSIS — E1169 Type 2 diabetes mellitus with other specified complication: Secondary | ICD-10-CM | POA: Diagnosis not present

## 2022-07-24 DIAGNOSIS — M810 Age-related osteoporosis without current pathological fracture: Secondary | ICD-10-CM | POA: Diagnosis not present

## 2022-07-24 DIAGNOSIS — Z23 Encounter for immunization: Secondary | ICD-10-CM | POA: Diagnosis not present

## 2022-07-24 DIAGNOSIS — Z1331 Encounter for screening for depression: Secondary | ICD-10-CM | POA: Diagnosis not present

## 2022-07-24 DIAGNOSIS — J449 Chronic obstructive pulmonary disease, unspecified: Secondary | ICD-10-CM | POA: Diagnosis not present

## 2022-07-25 ENCOUNTER — Encounter (HOSPITAL_COMMUNITY)
Admission: RE | Admit: 2022-07-25 | Discharge: 2022-07-25 | Disposition: A | Discharge status: Home / Self Care | Payer: Medicare Other | Source: Ambulatory Visit | Attending: Internal Medicine | Admitting: Internal Medicine

## 2022-07-25 DIAGNOSIS — D509 Iron deficiency anemia, unspecified: Secondary | ICD-10-CM | POA: Insufficient documentation

## 2022-07-25 LAB — TYPE AND SCREEN
Donor AG Type: NEGATIVE
Unit division: 0

## 2022-07-25 LAB — PREPARE RBC (CROSSMATCH)

## 2022-07-25 LAB — BPAM RBC
Unit Type and Rh: 5100
Unit Type and Rh: 6200

## 2022-07-25 MED ORDER — SODIUM CHLORIDE 0.9% IV SOLUTION: Freq: Once | INTRAVENOUS | Status: DC

## 2022-07-25 NOTE — Progress Notes (Signed)
Pt here for 2 units of PRBCs.  She has multiple antibodies and blood bank is having ot order the blood in.  Dr [facility] office notified.  Pt to return on Friday, 07/28/22 for 2 units.  Reinforced the need to keep blue arm band on or we would have to start the process over

## 2022-07-26 ENCOUNTER — Inpatient Hospital Stay: Payer: Medicare Other | Attending: Nurse Practitioner | Admitting: Hematology and Oncology

## 2022-07-26 ENCOUNTER — Other Ambulatory Visit (HOSPITAL_COMMUNITY): Payer: Self-pay | Admitting: *Deleted

## 2022-07-26 ENCOUNTER — Other Ambulatory Visit: Payer: Self-pay

## 2022-07-26 DIAGNOSIS — Z170 Estrogen receptor positive status [ER+]: Secondary | ICD-10-CM | POA: Insufficient documentation

## 2022-07-26 DIAGNOSIS — C50111 Malignant neoplasm of central portion of right female breast: Secondary | ICD-10-CM | POA: Insufficient documentation

## 2022-07-26 DIAGNOSIS — Z9011 Acquired absence of right breast and nipple: Secondary | ICD-10-CM | POA: Diagnosis not present

## 2022-07-26 MED ORDER — ANASTROZOLE 1 MG PO TABS: 1.0000 mg | ORAL_TABLET | Freq: Every day | ORAL | 3 refills | Status: DC

## 2022-07-26 NOTE — Assessment & Plan Note (Signed)
This is a very pleasant 77 year old postmenopausal female patient with Paget's disease of the left breast status postmastectomy now diagnosed with right breast multifocal invasive lobular carcinoma ranging from grade 1-2's ER strong positivity, PR positive in the first sample but negative in the second sample and HER2 negative Ki-67 40% referred to breast oncology for additional recommendations.  She arrived today to the appointment with her husband.  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   She is now status post mastectomy on the right side and this showed discontinuous fibrotic area of cancer measuring about 8.3 cm, negative margins.  ER 100% positive strong staining PR 85% positive strong staining.  I do not believe she is a candidate for adjuvant chemotherapy and we have clearly discussed this.  We have also discussed about antiestrogen therapy given strong ER/PR positivity.  I have clearly discussed the mechanism of aromatase inhibitors, adverse effects including but not limited to symptoms such as hot flashes, vaginal dryness, arthralgias, bone density loss, increased risk of cardiovascular events etc.  She is willing to try this.  Initially she did not want to but when she understood the purpose for it and the reduced risk of recurrence with antiestrogen therapy, she is willing to try it for a few months.  She will return to clinic in approximately 3 months for follow-up.  I will also consult with radiation oncology once again to make sure that she will not be receiving any adjuvant radiation.  Rachel Moulds MD

## 2022-07-26 NOTE — Progress Notes (Unsigned)
[facility] CONSULT NOTE  Patient Care Team: Alysia Penna, MD as PCP - General (Internal Medicine) Jake Bathe, MD as PCP - Cardiology (Cardiology) Pershing Proud, RN as Oncology Nurse Navigator Donnelly Angelica, RN as Oncology Nurse Navigator Rachel Moulds, MD as Consulting Physician (Hematology and Oncology)  CHIEF COMPLAINTS/PURPOSE OF CONSULTATION:  Newly diagnosed breast cancer  HISTORY OF PRESENTING ILLNESS:  Brandy Paul 77 y.o. female is here because of recent diagnosis of right breast cancer  I reviewed her records extensively and collaborated the history with the patient.  SUMMARY OF ONCOLOGIC HISTORY: Oncology History  Malignant neoplasm of central portion of right breast in female, estrogen receptor positive (HCC)  03/31/2022 Mammogram   Mammogram right breast showed an indeterminate 1 cm mass in the right breast at 1:00.  There is also an indeterminate 3.4 cm area of shadowing tissue in the retroareolar right breast at 9:00.  Limited evaluation of the axilla demonstrates normal lymph nodes. US of the breast confirmed same findings.   04/10/2022 Pathology Results   Right breast needle core biopsy at 1:00 showed invasive lobular carcinoma, grade 2 prognostic showed ER 100% positive strong staining PR 85% positive strong staining Ki-67 of 40% and group 5 HER2 negative.  The retroareolar biopsy also confirmed invasive lobular carcinoma grade 1 which is ER 100% positive strong staining PR 0% negative Ki-67 of 40% and HER2 negative   04/27/2022 Initial Diagnosis   Malignant neoplasm of central portion of right breast in female, estrogen receptor positive (HCC)    Ms. Sendy arrived to the appointment today with her husband.  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  She is here after surgery. She says she has healed well. She has chronic anemia, most recently had lab which showed a Hb of 7.2. She is going to receive transfusion soon. She apparently went off iron supplementation, during surgery, she is back on iron. No hematochezia or melena.  She had right breast mastectomy which showed grade 2 invasive lobular carcinoma discontinuously involving a fibrotic area of about 8.3 cm, carcinoma involves the nipple without evidence of ulceration or nodule formation.  Resection margins are negative for carcinoma.  Prior prognostic showed ER 100% positive strong staining intensity, PR 85% positive strong staining intensity, HER2 negative, Ki-67 of 40%.  She has been feeling well since surgery.  No complaints today for me.  MEDICAL HISTORY:  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)    Prediabetes    hx of   Rash    under right breast and groin line using nizoral ointment to q day per dermatology saw few weeks ago   Stress incontinence wers depends   Stroke (  HCC) 2013   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   Toe fracture, left     SURGICAL HISTORY: Past Surgical History:  Procedure Laterality Date   ABDOMINAL HYSTERECTOMY  1989   complete   APPENDECTOMY  2019   with gallbladder   BIOPSY  11/11/2019   Procedure: BIOPSY;  Surgeon: Sherrilyn Rist, MD;  Location: [facility];  Service: Gastroenterology;;   BREAST  BIOPSY Right 04/10/2022   [place] RT BREAST BX W LOC DEV EA ADD LESION IMG BX SPEC US GUIDE 04/10/2022 [facility]   BREAST BIOPSY Right 04/10/2022   [place] RT BREAST BX W LOC DEV 1ST LESION IMG BX SPEC US GUIDE 04/10/2022 [facility]   CATARACT EXTRACTION Bilateral    Late 1990s   CHOLECYSTECTOMY N/A 12/10/2017   Procedure: LAPAROSCOPIC CHOLECYSTECTOMY WITH INTRAOPERATIVE  ERAS PATHWAY;  Surgeon: Luretha Murphy, MD;  Location: [facility];  Service: General;  Laterality: N/A;   COLONOSCOPY WITH PROPOFOL N/A 12/03/2015   Procedure: COLONOSCOPY WITH PROPOFOL;  Surgeon: Napoleon Form, MD;  Location: [facility];  Service: Endoscopy;  Laterality: N/A;  tba before procdcedure   COLONOSCOPY WITH PROPOFOL N/A 11/11/2019   Procedure: COLONOSCOPY WITH PROPOFOL;  Surgeon: Sherrilyn Rist, MD;  Location: [facility];  Service: Gastroenterology;  Laterality: N/A;   ENTEROSCOPY N/A 11/11/2019   Procedure: ENTEROSCOPY;  Surgeon: Sherrilyn Rist, MD;  Location: [facility];  Service: Gastroenterology;  Laterality: N/A;   IR CHOLANGIOGRAM EXISTING TUBE  08/08/2017   IR CHOLANGIOGRAM EXISTING TUBE  08/27/2017   IR PERC CHOLECYSTOSTOMY  07/13/2017   LAPAROSCOPIC APPENDECTOMY N/A 12/10/2017   Procedure: APPENDECTOMY LAPAROSCOPIC;  Surgeon: Luretha Murphy, MD;  Location: [facility];  Service: General;  Laterality: N/A;   MASTECTOMY Left 2005   1 lymph node removed   MASTECTOMY W/ SENTINEL NODE BIOPSY Right 05/17/2022   Procedure: RIGHT MASTECTOMY;  Surgeon: Manus Rudd, MD;  Location: [facility];  Service: General;  Laterality: Right;   RADIAL KERATOTOMY Bilateral    ROBOTIC ASSISTED BILATERAL SALPINGO OOPHERECTOMY Bilateral 11/04/2015   Procedure: XI ROBOTIC ASSISTED BILATERAL SALPINGO OOPHORECTOMY;  Surgeon: Adolphus Birchwood, MD;  Location: [facility];  Service: Gynecology;  Laterality: Bilateral;   TONSILLECTOMY      SOCIAL HISTORY: Social History   Socioeconomic History   Marital status: Married     Spouse name: Merlyn Albert   Number of children: 4   Years of education: 21   Highest education level: Not on file  Occupational History   Occupation: retired  Tobacco Use   Smoking status: Former    Types: Cigarettes    Quit date: 02/17/1994    Years since quitting: 28.4   Smokeless tobacco: Never  Vaping Use   Vaping Use: Never used  Substance and Sexual Activity   Alcohol use: No    Alcohol/week: 0.0 standard drinks of alcohol   Drug use: No   Sexual activity: Not Currently  Other Topics Concern   Not on file  Social History Narrative   Lives with spouse   1-2 cups coffee   Social Determinants of Health   Financial Resource Strain: Not on file  Food Insecurity: Not on file  Transportation Needs: Not on file  Physical Activity: Not on file  Stress: Not on file  Social Connections: Not on file  Intimate Partner Violence: Not on file    FAMILY HISTORY: Family History  Problem Relation Age of Onset   CVA Mother  Liver cancer Mother        d. 97   Prostate cancer Father        metastatic, d. 59s   Hypertension Sister    Arthritis Sister    Diabetes Sister    Heart attack Sister    Colon polyps Sister    Cancer Brother        dx 63s, unsure of type   Stroke Maternal Grandfather    Skin cancer Son        stage V, SCC vs BCC? dx 69s   Colon cancer Neg Hx    Esophageal cancer Neg Hx    Rectal cancer Neg Hx     ALLERGIES:  is allergic to ceclor [cefaclor].  MEDICATIONS:  Current Outpatient Medications  Medication Sig Dispense Refill   anastrozole (ARIMIDEX) 1 MG tablet Take 1 tablet (1 mg total) by mouth daily. 90 tablet 3   acetaminophen (TYLENOL) 500 MG tablet Take 500 mg by mouth daily as needed for mild pain.      albuterol (VENTOLIN HFA) 108 (90 Base) MCG/ACT inhaler Inhale 1-2 puffs into the lungs every 6 (six) hours as needed for wheezing or shortness of breath.     atorvastatin (LIPITOR) 40 MG tablet Take 40 mg by mouth at bedtime.      buPROPion  (WELLBUTRIN SR) 150 MG 12 hr tablet Take 150 mg by mouth daily.  3   calcium carbonate (OSCAL) 1500 (600 Ca) MG TABS tablet Take 600 mg of elemental calcium by mouth daily.     clopidogrel (PLAVIX) 75 MG tablet Take 75 mg by mouth daily.     ezetimibe (ZETIA) 10 MG tablet Take 10 mg by mouth at bedtime.      ferrous sulfate 325 (65 FE) MG tablet Take 1 tablet (325 mg total) by mouth daily with breakfast. 30 tablet 11   isosorbide mononitrate (IMDUR) 60 MG 24 hr tablet Take 1 tablet (60 mg total) by mouth daily. Please make overdue appt with Dr. Katrinka Blazing before anymore refills. Thank you  3rd and final attempt 15 tablet 0   nitroGLYCERIN (NITROSTAT) 0.4 MG SL tablet Place 1 tablet (0.4 mg total) under the tongue every 5 (five) minutes as needed for chest pain (Call 911 at 3rd dose within 15 minutes). 25 tablet 3   oxyCODONE (OXY IR/ROXICODONE) 5 MG immediate release tablet Take 1 tablet (5 mg total) by mouth every 6 (six) hours as needed for moderate pain. 30 tablet 0   pantoprazole (PROTONIX) 40 MG tablet Take 1 tablet (40 mg total) by mouth daily. 30 tablet 11   sertraline (ZOLOFT) 100 MG tablet Take 100 mg by mouth at bedtime.   3   tiotropium (SPIRIVA) 18 MCG inhalation capsule Place 18 mcg into inhaler and inhale every evening.     trimethoprim (TRIMPEX) 100 MG tablet Take 100 mg by mouth daily.     Vibegron (GEMTESA) 75 MG TABS Take 75 mg by mouth daily.     vitamin B-12 1000 MCG tablet Take 1 tablet (1,000 mcg total) by mouth daily.     No current facility-administered medications for this visit.    REVIEW OF SYSTEMS:   Constitutional: Denies fevers, chills or abnormal night sweats Eyes: Denies blurriness of vision, double vision or watery eyes Ears, nose, mouth, throat, and face: Denies mucositis or sore throat Respiratory: Denies cough, dyspnea or wheezes Cardiovascular: Denies palpitation, chest discomfort or lower extremity swelling Gastrointestinal:  Denies nausea, heartburn or change  in bowel  habits Skin: Denies abnormal skin rashes Lymphatics: Denies new lymphadenopathy or easy bruising Neurological:Denies numbness, tingling or new weaknesses Behavioral/Psych: Mood is stable, no new changes  Breast: Denies any palpable lumps or discharge All other systems were reviewed with the patient and are negative.  PHYSICAL EXAMINATION: ECOG PERFORMANCE STATUS: 0 - Asymptomatic  Vitals:   07/26/22 1436  BP: 99/77  Pulse: 80  Resp: 18  Temp: (!) 97.5 F (36.4 C)  SpO2: 100%   Filed Weights   07/26/22 1436  Weight: 184 lb 3.2 oz (83.6 kg)    GENERAL:alert, wheelchair-bound.   LABORATORY DATA:  I have reviewed the data as listed Lab Results  Component Value Date   WBC 9.0 05/18/2022   HGB 6.3 (LL) 05/18/2022   HCT 21.7 (L) 05/18/2022   MCV 68.0 (L) 05/18/2022   PLT 189 05/18/2022   Lab Results  Component Value Date   NA 136 05/18/2022   K 4.4 05/18/2022   CL 102 05/18/2022   CO2 28 05/18/2022    RADIOGRAPHIC STUDIES: I have personally reviewed the radiological reports and agreed with the findings in the report.  ASSESSMENT AND PLAN:  Malignant neoplasm of central portion of right breast in female, estrogen receptor positive (HCC) This is a very pleasant 77 year old postmenopausal female patient with Paget's disease of the left breast status postmastectomy now diagnosed with right breast multifocal invasive lobular carcinoma ranging from grade 1-2's ER strong positivity, PR positive in the first sample but negative in the second sample and HER2 negative Ki-67 40% referred to breast oncology for additional recommendations.  She arrived today to the appointment with her husband.  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   She is now status post mastectomy on the right side and this showed discontinuous fibrotic area of cancer measuring about 8.3  cm, negative margins.  ER 100% positive strong staining PR 85% positive strong staining.  I do not believe she is a candidate for adjuvant chemotherapy and we have clearly discussed this.  We have also discussed about antiestrogen therapy given strong ER/PR positivity.  I have clearly discussed the mechanism of aromatase inhibitors, adverse effects including but not limited to symptoms such as hot flashes, vaginal dryness, arthralgias, bone density loss, increased risk of cardiovascular events etc.  She is willing to try this.  Initially she did not want to but when she understood the purpose for it and the reduced risk of recurrence with antiestrogen therapy, she is willing to try it for a few months.  She will return to clinic in approximately 3 months for follow-up.  I will also consult with radiation oncology once again to make sure that she will not be receiving any adjuvant radiation.  Rachel Moulds MD  Total time spent: 30 minutes including history, physical exam, review of records, counseling and coordination of care All questions were answered. The patient knows to call the clinic with any problems, questions or concerns.    Rachel Moulds, MD 07/27/22

## 2022-07-27 DIAGNOSIS — M1711 Unilateral primary osteoarthritis, right knee: Secondary | ICD-10-CM | POA: Diagnosis not present

## 2022-07-27 DIAGNOSIS — M1712 Unilateral primary osteoarthritis, left knee: Secondary | ICD-10-CM | POA: Diagnosis not present

## 2022-07-28 ENCOUNTER — Telehealth: Payer: Self-pay | Admitting: Radiation Oncology

## 2022-07-28 ENCOUNTER — Encounter (HOSPITAL_COMMUNITY)
Admission: RE | Admit: 2022-07-28 | Discharge: 2022-07-28 | Disposition: A | Discharge status: Home / Self Care | Payer: Medicare Other | Source: Ambulatory Visit | Attending: Internal Medicine | Admitting: Internal Medicine

## 2022-07-28 ENCOUNTER — Other Ambulatory Visit: Payer: Self-pay | Admitting: *Deleted

## 2022-07-28 DIAGNOSIS — Z170 Estrogen receptor positive status [ER+]: Secondary | ICD-10-CM

## 2022-07-28 DIAGNOSIS — D509 Iron deficiency anemia, unspecified: Secondary | ICD-10-CM | POA: Diagnosis not present

## 2022-07-28 LAB — TYPE AND SCREEN
ABO/RH(D): A POS
Antibody Screen: POSITIVE
Unit division: 0

## 2022-07-28 LAB — BPAM RBC
Blood Product Expiration Date: 202406082359
ISSUE DATE / TIME: 202405101110
Unit Type and Rh: 5100

## 2022-07-28 MED ORDER — SODIUM CHLORIDE 0.9% IV SOLUTION: Freq: Once | INTRAVENOUS | Status: DC

## 2022-07-28 NOTE — Telephone Encounter (Signed)
5/10 @ 1:21 pm Left voicemail for patient to call our office to be schedule for consult.

## 2022-07-29 LAB — TYPE AND SCREEN: Donor AG Type: NEGATIVE

## 2022-07-29 LAB — BPAM RBC
Blood Product Expiration Date: 202405252359
ISSUE DATE / TIME: 202405100809
Unit Type and Rh: 6200

## 2022-07-31 NOTE — Progress Notes (Signed)
New Breast Cancer Diagnosis: Right Breast- Central Portion  Did patient present with symptoms (if so, please note symptoms) or screening mammography?:Screening Mass    Location and Extent of disease :Mammogram of the right breast showed an indeterminate 1 cm mass in the right breast at 1:00.  There is also an indeterminate 3.4 cm area of shadowing tissue in the retro-areolar right breast at 9:00.  Limited evaluation of the axilla demonstrates normal lymph nodes.     Histology per Pathology Report: Mastectomy 05/17/2022    Histology per Pathology Report: At the 1 o'clock- grade 2, Invasive Lobular Carcinoma  Receptor Status: ER(positive), PR (positive), Her2-neu (negative), Ki-(40%)   Histology per Pathology Report: Retroareolar region- grade 1, Invasive Lobular Carcinoma  Receptor Status: ER(positive), PR (negative), Her2-neu (negative), Ki-(40%)   Surgeon and surgical plan, if any:  Dr. Corliss Skains -Right Mastectomy 05/17/2022   Medical oncologist, treatment if any:   Dr. Al Pimple 07/26/2022 -I do not believe she is a candidate for adjuvant chemotherapy and we have clearly discussed this.  -We have also discussed about antiestrogen therapy given strong ER/PR positivity.    Family History of Breast/Ovarian/Prostate Cancer:   Lymphedema issues, if any: No      Pain issues, if any: None  SAFETY ISSUES: Prior radiation? No Pacemaker/ICD? No Possible current pregnancy? Hysterectomy Is the patient on methotrexate? No  Current Complaints / other details:   -Wheelchair bound due to stroke -Paget's disease of the left breast, treated with mastectomy.

## 2022-07-31 NOTE — Progress Notes (Signed)
Radiation Oncology         ([phone]) [phone] ________________________________  Name: Brandy Paul        MRN: [id number]  Date of Service: 08/01/2022 DOB: 12/15/1945  CC:Brandy Penna, MD  Rachel Moulds, MD     REFERRING PHYSICIAN: Rachel Moulds, MD   DIAGNOSIS: The encounter diagnosis was Malignant neoplasm of central portion of right breast in female, estrogen receptor positive (HCC).   HISTORY OF PRESENT ILLNESS: Brandy Paul is a 77 y.o. female seen at the request of Dr. Al Pimple for a new diagnosis of right breast cancer.  The patient was found to have a screening detected mass in the right breast and this was seen in the 9 o'clock position by ultrasound measuring 3.4 cm.  In addition there was a 1 cm mass that was indeterminate also in the 1 o'clock position.  No evidence of adenopathy was appreciated.  She underwent a biopsy on 04/10/2022.  The 1 o'clock position biopsy showed a grade 2 invasive lobular carcinoma with focal LCIS and atypical lobular hyperplasia that was ER/PR positive, HER2 negative with a Ki-67 of 40%.  The 9 o'clock position showed a grade 1 invasive lobular carcinoma and this was ER positive PR negative HER2 negative with a Ki-67 of 40%.  She was counseled on the findings and elected to undergo a mastectomy which was performed on 05/17/2022.  This showed a grade 2 invasive lobular carcinoma involving a fibrotic area of 8.3 cm.  Carcinoma involves the nipple without evidence of ulceration and resection margins were negative.  No lymph nodes were sampled.  She met with medical oncology and was counseled on the rationale for discussion of radiotherapy in the adjuvant setting.  She is seen today to discuss this in clinic.    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)    Prediabetes    hx of   Rash    under right breast and groin line using nizoral ointment to q day per dermatology saw few weeks ago   Stress incontinence wers depends   Stroke [facility]) 2013   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   Toe fracture, left        PAST SURGICAL HISTORY: Past Surgical History:  Procedure Laterality Date   ABDOMINAL HYSTERECTOMY  1989   complete   APPENDECTOMY  2019   with gallbladder   BIOPSY  11/11/2019   Procedure: BIOPSY;  Surgeon: Sherrilyn Rist, MD;  Location: [facility];  Service: Gastroenterology;;   BREAST BIOPSY Right 04/10/2022   [place] RT BREAST BX W LOC DEV EA ADD LESION IMG BX SPEC US GUIDE 04/10/2022 [facility]   BREAST BIOPSY Right 04/10/2022   [place] RT BREAST BX W LOC DEV 1ST LESION IMG BX SPEC US GUIDE 04/10/2022 [facility]  CATARACT EXTRACTION Bilateral    Late 1990s   CHOLECYSTECTOMY N/A 12/10/2017   Procedure: LAPAROSCOPIC CHOLECYSTECTOMY WITH INTRAOPERATIVE  ERAS PATHWAY;  Surgeon: Luretha Murphy, MD;  Location: [facility];  Service: General;  Laterality: N/A;   COLONOSCOPY WITH PROPOFOL N/A 12/03/2015   Procedure: COLONOSCOPY WITH PROPOFOL;  Surgeon: Napoleon Form, MD;  Location: [facility];  Service: Endoscopy;  Laterality: N/A;  tba before procdcedure   COLONOSCOPY WITH PROPOFOL N/A 11/11/2019   Procedure: COLONOSCOPY WITH PROPOFOL;  Surgeon: Sherrilyn Rist, MD;  Location: [facility];  Service: Gastroenterology;  Laterality: N/A;   ENTEROSCOPY N/A 11/11/2019   Procedure: ENTEROSCOPY;  Surgeon: Sherrilyn Rist, MD;  Location: [facility];  Service:  Gastroenterology;  Laterality: N/A;   IR CHOLANGIOGRAM EXISTING TUBE  08/08/2017   IR CHOLANGIOGRAM EXISTING TUBE  08/27/2017   IR PERC CHOLECYSTOSTOMY  07/13/2017   LAPAROSCOPIC APPENDECTOMY N/A 12/10/2017   Procedure: APPENDECTOMY LAPAROSCOPIC;  Surgeon: Luretha Murphy, MD;  Location: [facility];  Service: General;  Laterality: N/A;   MASTECTOMY Left 2005   1 lymph node removed   MASTECTOMY W/ SENTINEL NODE BIOPSY Right 05/17/2022   Procedure: RIGHT MASTECTOMY;  Surgeon: Manus Rudd, MD;  Location: [facility];  Service: General;  Laterality: Right;   RADIAL KERATOTOMY Bilateral    ROBOTIC ASSISTED BILATERAL SALPINGO OOPHERECTOMY Bilateral 11/04/2015   Procedure: XI ROBOTIC ASSISTED BILATERAL SALPINGO OOPHORECTOMY;  Surgeon: Adolphus Birchwood, MD;  Location: [facility];  Service: Gynecology;  Laterality: Bilateral;   TONSILLECTOMY       FAMILY HISTORY:  Family History  Problem Relation Age of Onset   CVA Mother    Liver cancer Mother        d. 80   Prostate cancer Father        metastatic, d. 66s   Hypertension Sister    Arthritis Sister    Diabetes Sister    Heart attack Sister    Colon polyps Sister    Cancer Brother        dx 65s, unsure of type   Stroke Maternal Grandfather    Skin cancer Son        stage V, SCC vs BCC? dx 48s   Colon cancer Neg Hx    Esophageal cancer Neg Hx    Rectal cancer Neg Hx      SOCIAL HISTORY:  reports that she quit smoking about 28 years ago. Her smoking use included cigarettes. She has never used smokeless tobacco. She reports that she does not drink alcohol and does not use drugs.  The patient is married and she***   ALLERGIES: Ceclor [cefaclor]   MEDICATIONS:  Current Outpatient Medications  Medication Sig Dispense Refill   acetaminophen (TYLENOL) 500 MG tablet Take 500 mg by mouth daily as needed for mild pain.      albuterol (VENTOLIN HFA) 108 (90 Base) MCG/ACT inhaler Inhale 1-2 puffs into the lungs every 6 (six) hours as needed for wheezing or  shortness of breath.     anastrozole (ARIMIDEX) 1 MG tablet Take 1 tablet (1 mg total) by mouth daily. 90 tablet 3   atorvastatin (LIPITOR) 40 MG tablet Take 40 mg by mouth at bedtime.      buPROPion (WELLBUTRIN SR) 150 MG 12 hr tablet Take 150 mg by mouth daily.  3   calcium carbonate (OSCAL) 1500 (600 Ca) MG TABS tablet Take 600 mg of elemental calcium by mouth daily.     clopidogrel (PLAVIX) 75 MG tablet Take 75  mg by mouth daily.     ezetimibe (ZETIA) 10 MG tablet Take 10 mg by mouth at bedtime.      ferrous sulfate 325 (65 FE) MG tablet Take 1 tablet (325 mg total) by mouth daily with breakfast. 30 tablet 11   isosorbide mononitrate (IMDUR) 60 MG 24 hr tablet Take 1 tablet (60 mg total) by mouth daily. Please make overdue appt with Dr. Katrinka Blazing before anymore refills. Thank you  3rd and final attempt 15 tablet 0   nitroGLYCERIN (NITROSTAT) 0.4 MG SL tablet Place 1 tablet (0.4 mg total) under the tongue every 5 (five) minutes as needed for chest pain (Call 911 at 3rd dose within 15 minutes). 25 tablet 3   oxyCODONE (OXY IR/ROXICODONE) 5 MG immediate release tablet Take 1 tablet (5 mg total) by mouth every 6 (six) hours as needed for moderate pain. 30 tablet 0   pantoprazole (PROTONIX) 40 MG tablet Take 1 tablet (40 mg total) by mouth daily. 30 tablet 11   sertraline (ZOLOFT) 100 MG tablet Take 100 mg by mouth at bedtime.   3   tiotropium (SPIRIVA) 18 MCG inhalation capsule Place 18 mcg into inhaler and inhale every evening.     trimethoprim (TRIMPEX) 100 MG tablet Take 100 mg by mouth daily.     Vibegron (GEMTESA) 75 MG TABS Take 75 mg by mouth daily.     vitamin B-12 1000 MCG tablet Take 1 tablet (1,000 mcg total) by mouth daily.     No current facility-administered medications for this visit.     REVIEW OF SYSTEMS: On review of systems, the patient reports that she is doing ***     PHYSICAL EXAM:  Wt Readings from Last 3 Encounters:  07/26/22 184 lb 3.2 oz (83.6 kg)  05/17/22 205 lb  (93 kg)  05/15/22 205 lb (93 kg)   Temp Readings from Last 3 Encounters:  07/28/22 98 F (36.7 C) (Oral)  07/26/22 (!) 97.5 F (36.4 C) (Tympanic)  07/25/22 98.3 F (36.8 C) (Oral)   BP Readings from Last 3 Encounters:  07/28/22 133/63  07/26/22 99/77  07/25/22 123/88   Pulse Readings from Last 3 Encounters:  07/28/22 83  07/26/22 80  07/25/22 81    In general this is a well appearing caucasian female in no acute distress. She's alert and oriented x4 and appropriate throughout the examination. Cardiopulmonary assessment is negative for acute distress and she exhibits normal effort. Bilateral breast exam is deferred.    ECOG = ***  0 - Asymptomatic (Fully active, able to carry on all predisease activities without restriction)  1 - Symptomatic but completely ambulatory (Restricted in physically strenuous activity but ambulatory and able to carry out work of a light or sedentary nature. For example, light housework, office work)  2 - Symptomatic, <50% in bed during the day (Ambulatory and capable of all self care but unable to carry out any work activities. Up and about more than 50% of waking hours)  3 - Symptomatic, >50% in bed, but not bedbound (Capable of only limited self-care, confined to bed or chair 50% or more of waking hours)  4 - Bedbound (Completely disabled. Cannot carry on any self-care. Totally confined to bed or chair)  5 - Death   Santiago Glad MM, Creech RH, Tormey DC, et al. [phone]). "Toxicity and response criteria of the [facility]". Am. Evlyn Clines. Oncol. 5 (6): 649-55    LABORATORY DATA:  Lab Results  Component Value Date  WBC 9.0 05/18/2022   HGB 6.3 (LL) 05/18/2022   HCT 21.7 (L) 05/18/2022   MCV 68.0 (L) 05/18/2022   PLT 189 05/18/2022   Lab Results  Component Value Date   NA 136 05/18/2022   K 4.4 05/18/2022   CL 102 05/18/2022   CO2 28 05/18/2022   Lab Results  Component Value Date   ALT 10 10/01/2020   AST 13 (L)  10/01/2020   ALKPHOS 64 10/01/2020   BILITOT 0.7 10/01/2020      RADIOGRAPHY: No results found.     IMPRESSION/PLAN: 1. 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 Dr. Mitzi Hansen discusses the pathology findings and reviews the nature of breast disease. *** external radiotherapy to the chest wall due to reduce risks of local recurrence followed by antiestrogen therapy. We discussed the risks, benefits, short, and long term effects of radiotherapy, as well as the curative intent, and the patient is interested in proceeding. Dr. Mitzi Hansen discusses the delivery and logistics of radiotherapy and anticipates a course of 6 1/2 weeks of radiotherapy to the right chest wall. We will see her back a few weeks after surgery to discuss the simulation process and anticipate we starting radiotherapy about 4-6 weeks after surgery.     In a visit lasting *** minutes, greater than 50% of the time was spent face to face reviewing her case, as well as in preparation of, discussing, and coordinating the patient's care.  The above documentation reflects my direct findings during this shared patient visit. Please see the separate note by Dr. Mitzi Hansen on this date for the remainder of the patient's plan of care.    Osker Mason, [facility]    **Disclaimer: This note was dictated with voice recognition software. Similar sounding words can inadvertently be transcribed and this note may contain transcription errors which may not have been corrected upon publication of note.**

## 2022-08-01 ENCOUNTER — Encounter: Payer: Self-pay | Admitting: Radiation Oncology

## 2022-08-01 ENCOUNTER — Ambulatory Visit
Admission: RE | Admit: 2022-08-01 | Discharge: 2022-08-01 | Disposition: A | Discharge status: Home / Self Care | Payer: Medicare Other | Source: Ambulatory Visit | Attending: Radiation Oncology | Admitting: Radiation Oncology

## 2022-08-01 VITALS — BP 144/73 | HR 62 | Temp 98.1°F | Resp 18 | Ht 63.0 in | Wt 200.0 lb

## 2022-08-01 VITALS — BP 144/73 | HR 62 | Temp 98.1°F | Resp 18 | Wt 200.2 lb

## 2022-08-01 DIAGNOSIS — Z8673 Personal history of transient ischemic attack (TIA), and cerebral infarction without residual deficits: Secondary | ICD-10-CM | POA: Insufficient documentation

## 2022-08-01 DIAGNOSIS — Z7902 Long term (current) use of antithrombotics/antiplatelets: Secondary | ICD-10-CM | POA: Diagnosis not present

## 2022-08-01 DIAGNOSIS — Z800 Family history of malignant neoplasm of digestive organs: Secondary | ICD-10-CM | POA: Insufficient documentation

## 2022-08-01 DIAGNOSIS — M810 Age-related osteoporosis without current pathological fracture: Secondary | ICD-10-CM | POA: Insufficient documentation

## 2022-08-01 DIAGNOSIS — Z87891 Personal history of nicotine dependence: Secondary | ICD-10-CM | POA: Insufficient documentation

## 2022-08-01 DIAGNOSIS — Z79899 Other long term (current) drug therapy: Secondary | ICD-10-CM | POA: Diagnosis not present

## 2022-08-01 DIAGNOSIS — K219 Gastro-esophageal reflux disease without esophagitis: Secondary | ICD-10-CM | POA: Diagnosis not present

## 2022-08-01 DIAGNOSIS — C50111 Malignant neoplasm of central portion of right female breast: Secondary | ICD-10-CM | POA: Diagnosis not present

## 2022-08-01 DIAGNOSIS — R21 Rash and other nonspecific skin eruption: Secondary | ICD-10-CM | POA: Diagnosis not present

## 2022-08-01 DIAGNOSIS — Z170 Estrogen receptor positive status [ER+]: Secondary | ICD-10-CM

## 2022-08-01 DIAGNOSIS — Z79811 Long term (current) use of aromatase inhibitors: Secondary | ICD-10-CM | POA: Insufficient documentation

## 2022-08-01 DIAGNOSIS — E669 Obesity, unspecified: Secondary | ICD-10-CM | POA: Diagnosis not present

## 2022-08-01 DIAGNOSIS — E785 Hyperlipidemia, unspecified: Secondary | ICD-10-CM | POA: Insufficient documentation

## 2022-08-01 DIAGNOSIS — J449 Chronic obstructive pulmonary disease, unspecified: Secondary | ICD-10-CM | POA: Insufficient documentation

## 2022-09-04 DIAGNOSIS — M810 Age-related osteoporosis without current pathological fracture: Secondary | ICD-10-CM | POA: Diagnosis not present

## 2022-09-07 DIAGNOSIS — R350 Frequency of micturition: Secondary | ICD-10-CM | POA: Diagnosis not present

## 2022-09-07 DIAGNOSIS — N3946 Mixed incontinence: Secondary | ICD-10-CM | POA: Diagnosis not present

## 2022-10-05 DIAGNOSIS — M1712 Unilateral primary osteoarthritis, left knee: Secondary | ICD-10-CM | POA: Diagnosis not present

## 2022-10-12 DIAGNOSIS — M1712 Unilateral primary osteoarthritis, left knee: Secondary | ICD-10-CM | POA: Diagnosis not present

## 2022-10-18 DIAGNOSIS — M1712 Unilateral primary osteoarthritis, left knee: Secondary | ICD-10-CM | POA: Diagnosis not present

## 2022-10-31 ENCOUNTER — Inpatient Hospital Stay: Payer: Medicare Other | Attending: Nurse Practitioner | Admitting: Adult Health

## 2022-10-31 ENCOUNTER — Encounter: Payer: Self-pay | Admitting: Adult Health

## 2022-10-31 VITALS — BP 128/53 | HR 74 | Temp 98.5°F

## 2022-10-31 DIAGNOSIS — Z170 Estrogen receptor positive status [ER+]: Secondary | ICD-10-CM | POA: Insufficient documentation

## 2022-10-31 DIAGNOSIS — Z79811 Long term (current) use of aromatase inhibitors: Secondary | ICD-10-CM | POA: Insufficient documentation

## 2022-10-31 DIAGNOSIS — Z9013 Acquired absence of bilateral breasts and nipples: Secondary | ICD-10-CM | POA: Insufficient documentation

## 2022-10-31 DIAGNOSIS — C50111 Malignant neoplasm of central portion of right female breast: Secondary | ICD-10-CM | POA: Insufficient documentation

## 2022-10-31 DIAGNOSIS — Z87891 Personal history of nicotine dependence: Secondary | ICD-10-CM | POA: Insufficient documentation

## 2022-10-31 NOTE — Progress Notes (Signed)
SURVIVORSHIP VISIT:  BRIEF ONCOLOGIC HISTORY:  Oncology History  Malignant neoplasm of central portion of right breast in female, estrogen receptor positive (HCC)  2005 Surgery   Pagets of the left nipple, s/p mastectomy and LN biopsy   03/31/2022 Mammogram   Mammogram right breast showed an indeterminate 1 cm mass in the right breast at 1:00.  There is also an indeterminate 3.4 cm area of shadowing tissue in the retroareolar right breast at 9:00.  Limited evaluation of the axilla demonstrates normal lymph nodes. US of the breast confirmed same findings.   04/10/2022 Pathology Results   Right breast needle core biopsy at 1:00 showed invasive lobular carcinoma, grade 2 prognostic showed ER 100% positive strong staining PR 85% positive strong staining Ki-67 of 40% and group 5 HER2 negative.  The retroareolar biopsy also confirmed invasive lobular carcinoma grade 1 which is ER 100% positive strong staining PR 0% negative Ki-67 of 40% and HER2 negative   05/17/2022 Surgery   Right breast mastectomy: ILC, grade 2, 8.3 cm, no LN biopsied, margins negative   08/01/2022 Cancer Staging   Staging form: Breast, AJCC 8th Edition - Pathologic stage from 08/01/2022: Stage IIB (pT3, pN0, cM0, G2, ER+, PR-, HER2-) - Signed by Loa Socks, NP on 10/31/2022 Histologic grading system: 3 grade system     INTERVAL HISTORY:  Ms. Smedley to review her survivorship care plan detailing her treatment course for breast cancer, as well as monitoring long-term side effects of that treatment, education regarding health maintenance, screening, and overall wellness and health promotion.     Overall, Ms. Tamez reports feeling quite well.  She denies any significant issues in taking anastrozole daily.  She is in a wheelchair.  She is tolerating the anastrozole without difficulty.  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  She has residual right sided weakness and is unable to use a  lot of the right side of her body.  REVIEW OF SYSTEMS:  Review of Systems  Constitutional:  Negative for appetite change, chills, fatigue, fever and unexpected weight change.  HENT:   Negative for hearing loss, lump/mass and trouble swallowing.   Eyes:  Negative for eye problems and icterus.  Respiratory:  Negative for chest tightness, cough and shortness of breath.   Cardiovascular:  Negative for chest pain, leg swelling and palpitations.  Gastrointestinal:  Negative for abdominal distention, abdominal pain, constipation, diarrhea, nausea and vomiting.  Endocrine: Negative for hot flashes.  Genitourinary:  Negative for difficulty urinating.   Musculoskeletal:  Negative for arthralgias.  Skin:  Negative for itching and rash.  Neurological:  Negative for dizziness, extremity weakness, headaches and numbness.  Hematological:  Negative for adenopathy. Does not bruise/bleed easily.  Psychiatric/Behavioral:  Negative for depression. The patient is not nervous/anxious.    Breast: Denies any new nodularity, masses, tenderness, nipple changes, or nipple discharge.       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)    Prediabetes    hx of   Rash    under right breast and groin line using nizoral ointment to q day per dermatology saw few weeks ago   Stress incontinence wers depends   Stroke [facility]) 2013   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   Toe fracture, left     Past Surgical History:  Procedure Laterality Date   ABDOMINAL HYSTERECTOMY  1989   complete   APPENDECTOMY  2019   with gallbladder   BIOPSY  11/11/2019   Procedure: BIOPSY;  Surgeon: Sherrilyn Rist, MD;  Location: [facility];  Service: Gastroenterology;;   BREAST BIOPSY Right 04/10/2022   [place] RT BREAST BX W LOC DEV EA ADD LESION IMG BX SPEC US GUIDE 04/10/2022 [facility]   BREAST BIOPSY Right 04/10/2022   [place] RT BREAST BX W LOC DEV 1ST LESION IMG BX SPEC US GUIDE 04/10/2022 [facility]   CATARACT EXTRACTION Bilateral    Late 1990s   CHOLECYSTECTOMY N/A 12/10/2017   Procedure: LAPAROSCOPIC CHOLECYSTECTOMY WITH INTRAOPERATIVE  ERAS PATHWAY;  Surgeon: Luretha Murphy, MD;  Location: [facility];  Service: General;  Laterality: N/A;   COLONOSCOPY WITH PROPOFOL N/A 12/03/2015   Procedure: COLONOSCOPY WITH PROPOFOL;  Surgeon: Napoleon Form, MD;  Location: [facility];  Service: Endoscopy;  Laterality: N/A;  tba before procdcedure   COLONOSCOPY WITH PROPOFOL N/A 11/11/2019   Procedure: COLONOSCOPY WITH PROPOFOL;  Surgeon: Sherrilyn Rist, MD;  Location: [facility];  Service: Gastroenterology;  Laterality: N/A;   ENTEROSCOPY N/A 11/11/2019   Procedure: ENTEROSCOPY;  Surgeon: Sherrilyn Rist, MD;  Location: [facility];  Service: Gastroenterology;  Laterality: N/A;   IR CHOLANGIOGRAM EXISTING TUBE  08/08/2017   IR CHOLANGIOGRAM EXISTING TUBE  08/27/2017   IR PERC CHOLECYSTOSTOMY  07/13/2017   LAPAROSCOPIC APPENDECTOMY N/A 12/10/2017   Procedure: APPENDECTOMY LAPAROSCOPIC;  Surgeon: Luretha Murphy, MD;  Location: [facility];  Service: General;  Laterality: N/A;   MASTECTOMY Left 2005   1 lymph node removed   MASTECTOMY W/ SENTINEL NODE BIOPSY Right 05/17/2022   Procedure: RIGHT MASTECTOMY;  Surgeon: Manus Rudd, MD;  Location: [facility];  Service: General;  Laterality: Right;   RADIAL KERATOTOMY Bilateral    ROBOTIC ASSISTED BILATERAL SALPINGO OOPHERECTOMY Bilateral  11/04/2015   Procedure: XI ROBOTIC ASSISTED BILATERAL SALPINGO OOPHORECTOMY;  Surgeon: Adolphus Birchwood, MD;  Location: [facility];  Service: Gynecology;  Laterality: Bilateral;   TONSILLECTOMY       ALLERGIES:  Allergies  Allergen Reactions   Ceclor [Cefaclor] Other (See Comments)    REACTION: "SEVERE HEADACHE"     CURRENT MEDICATIONS:  Outpatient Encounter Medications as of 10/31/2022  Medication Sig Note   acetaminophen (TYLENOL) 500 MG tablet Take 500 mg by mouth daily as needed for mild pain.     albuterol (VENTOLIN HFA) 108 (90 Base) MCG/ACT inhaler Inhale 1-2 puffs into the lungs every 6 (six) hours as needed for wheezing or shortness of breath.    anastrozole (ARIMIDEX) 1 MG tablet Take 1 tablet (1 mg total) by mouth daily.    atorvastatin (LIPITOR) 40 MG tablet Take 40 mg by mouth at bedtime.     buPROPion (WELLBUTRIN SR) 150 MG 12 hr tablet Take 150 mg by mouth daily.    calcium carbonate (OSCAL) 1500 (600 Ca) MG TABS tablet Take 600 mg of elemental  calcium by mouth daily.    clopidogrel (PLAVIX) 75 MG tablet Take 75 mg by mouth daily.    ezetimibe (ZETIA) 10 MG tablet Take 10 mg by mouth at bedtime.     ferrous sulfate 325 (65 FE) MG tablet Take 1 tablet (325 mg total) by mouth daily with breakfast.    isosorbide mononitrate (IMDUR) 60 MG 24 hr tablet Take 1 tablet (60 mg total) by mouth daily. Please make overdue appt with Dr. Katrinka Blazing before anymore refills. Thank you  3rd and final attempt    nitroGLYCERIN (NITROSTAT) 0.4 MG SL tablet Place 1 tablet (0.4 mg total) under the tongue every 5 (five) minutes as needed for chest pain (Call 911 at 3rd dose within 15 minutes). (Patient not taking: Reported on 08/01/2022)    oxybutynin (DITROPAN-XL) 10 MG 24 hr tablet Take 1 tablet by mouth daily.    oxyCODONE (OXY IR/ROXICODONE) 5 MG immediate release tablet Take 1 tablet (5 mg total) by mouth every 6 (six) hours as needed for moderate pain. (Patient not taking: Reported on 08/01/2022)     pantoprazole (PROTONIX) 40 MG tablet Take 1 tablet (40 mg total) by mouth daily.    sertraline (ZOLOFT) 100 MG tablet Take 100 mg by mouth at bedtime.     tiotropium (SPIRIVA) 18 MCG inhalation capsule Place 18 mcg into inhaler and inhale every evening.    trimethoprim (TRIMPEX) 100 MG tablet Take 100 mg by mouth daily.    Vibegron (GEMTESA) 75 MG TABS Take 75 mg by mouth daily. 05/11/2022: Currently needs a refill   vitamin B-12 1000 MCG tablet Take 1 tablet (1,000 mcg total) by mouth daily.    No facility-administered encounter medications on file as of 10/31/2022.     ONCOLOGIC FAMILY HISTORY:  Family History  Problem Relation Age of Onset   CVA Mother    Liver cancer Mother        d. 3   Prostate cancer Father        metastatic, d. 21s   Hypertension Sister    Arthritis Sister    Diabetes Sister    Heart attack Sister    Colon polyps Sister    Cancer Brother        dx 47s, unsure of type   Stroke Maternal Grandfather    Skin cancer Son        stage V, SCC vs BCC? dx 46s   Colon cancer Neg Hx    Esophageal cancer Neg Hx    Rectal cancer Neg Hx      SOCIAL HISTORY:  Social History   Socioeconomic History   Marital status: Married    Spouse name: Merlyn Albert   Number of children: 4   Years of education: 21   Highest education level: Not on file  Occupational History   Occupation: retired  Tobacco Use   Smoking status: Former    Current packs/day: 0.00    Types: Cigarettes    Quit date: 02/17/1994    Years since quitting: 28.7   Smokeless tobacco: Never  Vaping Use   Vaping status: Never Used  Substance and Sexual Activity   Alcohol use: No    Alcohol/week: 0.0 standard drinks of alcohol   Drug use: No   Sexual activity: Not Currently  Other Topics Concern   Not on file  Social History Narrative   Lives with spouse   1-2 cups coffee   Social Determinants of Health   Financial Resource Strain: Not on file  Food Insecurity: Not on file  Transportation Needs:  Not on file  Physical Activity: Not on file  Stress: Not on file  Social Connections: Not on file  Intimate Partner Violence: Not on file     OBSERVATIONS/OBJECTIVE:  BP (!) 128/53 (BP Location: Left Arm, Patient Position: Sitting)   Pulse 74   Temp 98.5 F (36.9 C)   SpO2 100%  GENERAL: Patient is a well appearing female in no acute distress HEENT:  Sclerae anicteric.  Oropharynx clear and moist. No ulcerations or evidence of oropharyngeal candidiasis. Neck is supple.  NODES:  No cervical, supraclavicular, or axillary lymphadenopathy palpated.  BREAST EXAM:  s/p bilateral mastectomy no sign of local recurrence LUNGS:  Clear to auscultation bilaterally.  No wheezes or rhonchi. HEART:  Regular rate and rhythm. No murmur appreciated. ABDOMEN:  Soft, nontender.  Positive, normoactive bowel sounds. No organomegaly palpated. MSK:  No focal spinal tenderness to palpation. Full range of motion bilaterally in the upper extremities. EXTREMITIES:  No peripheral edema.   SKIN:  Clear with no obvious rashes or skin changes. No nail dyscrasia. NEURO:  Nonfocal. Well oriented.  Appropriate affect.   LABORATORY DATA:  None for this visit.  DIAGNOSTIC IMAGING:  None for this visit.      ASSESSMENT AND PLAN:  Ms.. Becton is a pleasant 77 y.o. 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  She presents to the Survivorship Clinic for our initial meeting and routine follow-up post-completion of treatment for breast cancer.    1. Stage 2B right breast cancer:  Ms. Brinck is continuing to recover from definitive treatment for breast cancer. She will follow-up with her medical oncologist, Dr.  Al Pimple in 6 months with history and physical exam per surveillance protocol.  She will continue her anti-estrogen therapy with Anastrozole. Thus far, she is tolerating the  Anastrozole well, with minimal side effects.  Today, a comprehensive survivorship care plan and treatment summary was reviewed with the patient today detailing her breast cancer diagnosis, treatment course, potential late/long-term effects of treatment, appropriate follow-up care with recommendations for the future, and patient education resources.  A copy of this summary, along with a letter will be sent to the patient's primary care provider via mail/fax/In Basket message after today's visit.    2. Bone health:  Given Ms. Ackroyd's age/history of breast cancer and her current treatment regimen including anti-estrogen therapy with anastrozole, she is at risk for bone demineralization.  She undergoes bone density testing regularly with her primary care provider.  We can obtain those results and follow along.  She was given education on specific activities to promote bone health.  3. Cancer screening:  Due to Ms. Foos's history and her age, she should receive screening for skin cancers.  The information and recommendations are listed on the patient's comprehensive care plan/treatment summary and were reviewed in detail with the patient.    4. Health maintenance and wellness promotion: Ms. Figurski was encouraged to consume 5-7 servings of fruits and vegetables per day. We reviewed the "Nutrition Rainbow" handout.  She was also encouraged to engage in moderate to vigorous exercise for 30 minutes per day most days of the week.  She was instructed to limit her alcohol consumption and continue to abstain from tobacco use.     5. Support services/counseling: It is not uncommon for this period of the patient's cancer care trajectory to be  one of many emotions and stressors.   She was given information regarding our available services and encouraged to contact me with any questions or for help enrolling in any of our support group/programs.    Follow up instructions:    -Return to cancer center in 6 months for  f/u with Dr. Al Pimple  -Bone density testing with PCP as scheduled -She is welcome to return back to the Survivorship Clinic at any time; no additional follow-up needed at this time.  -Consider referral back to survivorship as a long-term survivor for continued surveillance  The patient was provided an opportunity to ask questions and all were answered. The patient agreed with the plan and demonstrated an understanding of the instructions.   Total encounter time:45 minutes*in face-to-face visit time, chart review, lab review, care coordination, order entry, and documentation of the encounter time.    Lillard Anes, NP 10/31/22 3:24 PM Medical Oncology and Hematology [facility] [address] [place], [place] [address] Tel. [phone]    Fax. [phone]  *Total Encounter Time as defined by the Centers for Medicare and Medicaid Services includes, in addition to the face-to-face time of a patient visit (documented in the note above) non-face-to-face time: obtaining and reviewing outside history, ordering and reviewing medications, tests or procedures, care coordination (communications with other health care professionals or caregivers) and documentation in the medical record.

## 2022-11-01 ENCOUNTER — Telehealth: Payer: Self-pay | Admitting: Adult Health

## 2022-11-01 NOTE — Telephone Encounter (Signed)
Scheduled appointment per 8/13 los. Talked with the patients spouse and he is aware of the made appointment for the patient.

## 2022-11-30 DIAGNOSIS — H02009 Unspecified entropion of unspecified eye, unspecified eyelid: Secondary | ICD-10-CM | POA: Diagnosis not present

## 2022-11-30 DIAGNOSIS — H02403 Unspecified ptosis of bilateral eyelids: Secondary | ICD-10-CM | POA: Diagnosis not present

## 2022-12-20 ENCOUNTER — Inpatient Hospital Stay (HOSPITAL_COMMUNITY): Payer: Medicare Other

## 2022-12-20 ENCOUNTER — Emergency Department (HOSPITAL_COMMUNITY): Payer: Medicare Other

## 2022-12-20 ENCOUNTER — Other Ambulatory Visit: Payer: Self-pay

## 2022-12-20 ENCOUNTER — Encounter (HOSPITAL_COMMUNITY): Payer: Self-pay

## 2022-12-20 ENCOUNTER — Inpatient Hospital Stay (HOSPITAL_COMMUNITY)
Admission: EM | Admit: 2022-12-20 | Discharge: 2022-12-27 | DRG: 378 | Disposition: A | Discharge status: Skilled Nursing Facility | Payer: Medicare Other | Attending: Internal Medicine | Admitting: Internal Medicine

## 2022-12-20 DIAGNOSIS — S0993XA Unspecified injury of face, initial encounter: Secondary | ICD-10-CM | POA: Diagnosis not present

## 2022-12-20 DIAGNOSIS — Y92015 Private garage of single-family (private) house as the place of occurrence of the external cause: Secondary | ICD-10-CM | POA: Diagnosis not present

## 2022-12-20 DIAGNOSIS — M810 Age-related osteoporosis without current pathological fracture: Secondary | ICD-10-CM | POA: Diagnosis present

## 2022-12-20 DIAGNOSIS — D649 Anemia, unspecified: Secondary | ICD-10-CM | POA: Diagnosis not present

## 2022-12-20 DIAGNOSIS — G8929 Other chronic pain: Secondary | ICD-10-CM | POA: Diagnosis present

## 2022-12-20 DIAGNOSIS — Z743 Need for continuous supervision: Secondary | ICD-10-CM | POA: Diagnosis not present

## 2022-12-20 DIAGNOSIS — S8001XA Contusion of right knee, initial encounter: Secondary | ICD-10-CM | POA: Diagnosis not present

## 2022-12-20 DIAGNOSIS — E66812 Obesity, class 2: Secondary | ICD-10-CM | POA: Diagnosis present

## 2022-12-20 DIAGNOSIS — Z6835 Body mass index (BMI) 35.0-35.9, adult: Secondary | ICD-10-CM

## 2022-12-20 DIAGNOSIS — N179 Acute kidney failure, unspecified: Secondary | ICD-10-CM | POA: Diagnosis present

## 2022-12-20 DIAGNOSIS — Z23 Encounter for immunization: Secondary | ICD-10-CM | POA: Diagnosis not present

## 2022-12-20 DIAGNOSIS — Z79811 Long term (current) use of aromatase inhibitors: Secondary | ICD-10-CM

## 2022-12-20 DIAGNOSIS — S0990XA Unspecified injury of head, initial encounter: Secondary | ICD-10-CM | POA: Diagnosis not present

## 2022-12-20 DIAGNOSIS — N3281 Overactive bladder: Secondary | ICD-10-CM | POA: Diagnosis not present

## 2022-12-20 DIAGNOSIS — G459 Transient cerebral ischemic attack, unspecified: Secondary | ICD-10-CM | POA: Diagnosis not present

## 2022-12-20 DIAGNOSIS — M6259 Muscle wasting and atrophy, not elsewhere classified, multiple sites: Secondary | ICD-10-CM | POA: Diagnosis not present

## 2022-12-20 DIAGNOSIS — R296 Repeated falls: Secondary | ICD-10-CM | POA: Diagnosis present

## 2022-12-20 DIAGNOSIS — E119 Type 2 diabetes mellitus without complications: Secondary | ICD-10-CM

## 2022-12-20 DIAGNOSIS — Z8249 Family history of ischemic heart disease and other diseases of the circulatory system: Secondary | ICD-10-CM

## 2022-12-20 DIAGNOSIS — I10 Essential (primary) hypertension: Secondary | ICD-10-CM | POA: Diagnosis not present

## 2022-12-20 DIAGNOSIS — I69351 Hemiplegia and hemiparesis following cerebral infarction affecting right dominant side: Secondary | ICD-10-CM

## 2022-12-20 DIAGNOSIS — S8002XA Contusion of left knee, initial encounter: Secondary | ICD-10-CM | POA: Diagnosis not present

## 2022-12-20 DIAGNOSIS — M170 Bilateral primary osteoarthritis of knee: Secondary | ICD-10-CM | POA: Diagnosis present

## 2022-12-20 DIAGNOSIS — K5730 Diverticulosis of large intestine without perforation or abscess without bleeding: Secondary | ICD-10-CM | POA: Diagnosis present

## 2022-12-20 DIAGNOSIS — Z8042 Family history of malignant neoplasm of prostate: Secondary | ICD-10-CM

## 2022-12-20 DIAGNOSIS — Z83719 Family history of colon polyps, unspecified: Secondary | ICD-10-CM

## 2022-12-20 DIAGNOSIS — I6782 Cerebral ischemia: Secondary | ICD-10-CM | POA: Diagnosis not present

## 2022-12-20 DIAGNOSIS — Z853 Personal history of malignant neoplasm of breast: Secondary | ICD-10-CM

## 2022-12-20 DIAGNOSIS — Z1152 Encounter for screening for COVID-19: Secondary | ICD-10-CM | POA: Diagnosis not present

## 2022-12-20 DIAGNOSIS — R41841 Cognitive communication deficit: Secondary | ICD-10-CM | POA: Diagnosis not present

## 2022-12-20 DIAGNOSIS — Z87440 Personal history of urinary (tract) infections: Secondary | ICD-10-CM

## 2022-12-20 DIAGNOSIS — Z043 Encounter for examination and observation following other accident: Secondary | ICD-10-CM | POA: Diagnosis not present

## 2022-12-20 DIAGNOSIS — D62 Acute posthemorrhagic anemia: Secondary | ICD-10-CM | POA: Diagnosis present

## 2022-12-20 DIAGNOSIS — I252 Old myocardial infarction: Secondary | ICD-10-CM | POA: Diagnosis not present

## 2022-12-20 DIAGNOSIS — F329 Major depressive disorder, single episode, unspecified: Secondary | ICD-10-CM | POA: Diagnosis present

## 2022-12-20 DIAGNOSIS — Z7902 Long term (current) use of antithrombotics/antiplatelets: Secondary | ICD-10-CM

## 2022-12-20 DIAGNOSIS — S199XXA Unspecified injury of neck, initial encounter: Secondary | ICD-10-CM | POA: Diagnosis not present

## 2022-12-20 DIAGNOSIS — K254 Chronic or unspecified gastric ulcer with hemorrhage: Principal | ICD-10-CM | POA: Diagnosis present

## 2022-12-20 DIAGNOSIS — Z808 Family history of malignant neoplasm of other organs or systems: Secondary | ICD-10-CM

## 2022-12-20 DIAGNOSIS — Z860101 Personal history of adenomatous and serrated colon polyps: Secondary | ICD-10-CM

## 2022-12-20 DIAGNOSIS — J432 Centrilobular emphysema: Secondary | ICD-10-CM | POA: Diagnosis not present

## 2022-12-20 DIAGNOSIS — Z833 Family history of diabetes mellitus: Secondary | ICD-10-CM

## 2022-12-20 DIAGNOSIS — Z170 Estrogen receptor positive status [ER+]: Secondary | ICD-10-CM | POA: Diagnosis not present

## 2022-12-20 DIAGNOSIS — Z87891 Personal history of nicotine dependence: Secondary | ICD-10-CM | POA: Diagnosis not present

## 2022-12-20 DIAGNOSIS — Z823 Family history of stroke: Secondary | ICD-10-CM

## 2022-12-20 DIAGNOSIS — J449 Chronic obstructive pulmonary disease, unspecified: Secondary | ICD-10-CM | POA: Diagnosis not present

## 2022-12-20 DIAGNOSIS — E785 Hyperlipidemia, unspecified: Secondary | ICD-10-CM | POA: Diagnosis not present

## 2022-12-20 DIAGNOSIS — R531 Weakness: Secondary | ICD-10-CM | POA: Diagnosis not present

## 2022-12-20 DIAGNOSIS — M25562 Pain in left knee: Secondary | ICD-10-CM | POA: Diagnosis not present

## 2022-12-20 DIAGNOSIS — Z8673 Personal history of transient ischemic attack (TIA), and cerebral infarction without residual deficits: Secondary | ICD-10-CM | POA: Diagnosis not present

## 2022-12-20 DIAGNOSIS — D500 Iron deficiency anemia secondary to blood loss (chronic): Secondary | ICD-10-CM

## 2022-12-20 DIAGNOSIS — W19XXXA Unspecified fall, initial encounter: Secondary | ICD-10-CM | POA: Diagnosis not present

## 2022-12-20 DIAGNOSIS — S0093XA Contusion of unspecified part of head, initial encounter: Secondary | ICD-10-CM | POA: Diagnosis present

## 2022-12-20 DIAGNOSIS — E669 Obesity, unspecified: Secondary | ICD-10-CM | POA: Diagnosis present

## 2022-12-20 DIAGNOSIS — M25561 Pain in right knee: Secondary | ICD-10-CM | POA: Diagnosis not present

## 2022-12-20 DIAGNOSIS — R1311 Dysphagia, oral phase: Secondary | ICD-10-CM | POA: Diagnosis not present

## 2022-12-20 DIAGNOSIS — Z7901 Long term (current) use of anticoagulants: Secondary | ICD-10-CM

## 2022-12-20 DIAGNOSIS — M4692 Unspecified inflammatory spondylopathy, cervical region: Secondary | ICD-10-CM | POA: Diagnosis not present

## 2022-12-20 DIAGNOSIS — K449 Diaphragmatic hernia without obstruction or gangrene: Secondary | ICD-10-CM | POA: Diagnosis present

## 2022-12-20 DIAGNOSIS — J99 Respiratory disorders in diseases classified elsewhere: Secondary | ICD-10-CM | POA: Diagnosis not present

## 2022-12-20 DIAGNOSIS — I69320 Aphasia following cerebral infarction: Secondary | ICD-10-CM | POA: Diagnosis not present

## 2022-12-20 DIAGNOSIS — Z90710 Acquired absence of both cervix and uterus: Secondary | ICD-10-CM

## 2022-12-20 DIAGNOSIS — Z9013 Acquired absence of bilateral breasts and nipples: Secondary | ICD-10-CM

## 2022-12-20 DIAGNOSIS — C50111 Malignant neoplasm of central portion of right female breast: Secondary | ICD-10-CM | POA: Diagnosis not present

## 2022-12-20 DIAGNOSIS — Z79899 Other long term (current) drug therapy: Secondary | ICD-10-CM

## 2022-12-20 DIAGNOSIS — D509 Iron deficiency anemia, unspecified: Secondary | ICD-10-CM | POA: Diagnosis present

## 2022-12-20 DIAGNOSIS — R9089 Other abnormal findings on diagnostic imaging of central nervous system: Secondary | ICD-10-CM | POA: Diagnosis not present

## 2022-12-20 DIAGNOSIS — Z800 Family history of malignant neoplasm of digestive organs: Secondary | ICD-10-CM

## 2022-12-20 DIAGNOSIS — R2689 Other abnormalities of gait and mobility: Secondary | ICD-10-CM | POA: Diagnosis not present

## 2022-12-20 DIAGNOSIS — K648 Other hemorrhoids: Secondary | ICD-10-CM | POA: Diagnosis present

## 2022-12-20 DIAGNOSIS — Z8261 Family history of arthritis: Secondary | ICD-10-CM

## 2022-12-20 DIAGNOSIS — M6281 Muscle weakness (generalized): Secondary | ICD-10-CM | POA: Diagnosis not present

## 2022-12-20 DIAGNOSIS — R9431 Abnormal electrocardiogram [ECG] [EKG]: Secondary | ICD-10-CM | POA: Diagnosis not present

## 2022-12-20 DIAGNOSIS — Z888 Allergy status to other drugs, medicaments and biological substances status: Secondary | ICD-10-CM

## 2022-12-20 DIAGNOSIS — K219 Gastro-esophageal reflux disease without esophagitis: Secondary | ICD-10-CM | POA: Diagnosis present

## 2022-12-20 LAB — I-STAT CHEM 8, ED
BUN: 26 mg/dL — ABNORMAL HIGH (ref 8–23)
Calcium, Ion: 1.12 mmol/L — ABNORMAL LOW (ref 1.15–1.40)
Chloride: 102 mmol/L (ref 98–111)
Creatinine, Ser: 1.7 mg/dL — ABNORMAL HIGH (ref 0.44–1.00)
Glucose, Bld: 151 mg/dL — ABNORMAL HIGH (ref 70–99)
HCT: 25 % — ABNORMAL LOW (ref 36.0–46.0)
Hemoglobin: 8.5 g/dL — ABNORMAL LOW (ref 12.0–15.0)
Potassium: 4.4 mmol/L (ref 3.5–5.1)
Sodium: 136 mmol/L (ref 135–145)
TCO2: 22 mmol/L (ref 22–32)

## 2022-12-20 LAB — CBC
HCT: 25.5 % — ABNORMAL LOW (ref 36.0–46.0)
Hemoglobin: 7 g/dL — ABNORMAL LOW (ref 12.0–15.0)
MCH: 21.7 pg — ABNORMAL LOW (ref 26.0–34.0)
MCHC: 27.5 g/dL — ABNORMAL LOW (ref 30.0–36.0)
MCV: 79.2 fL — ABNORMAL LOW (ref 80.0–100.0)
Platelets: 321 10*3/uL (ref 150–400)
RBC: 3.22 MIL/uL — ABNORMAL LOW (ref 3.87–5.11)
RDW: 20.9 % — ABNORMAL HIGH (ref 11.5–15.5)
WBC: 10.8 10*3/uL — ABNORMAL HIGH (ref 4.0–10.5)
nRBC: 0.6 % — ABNORMAL HIGH (ref 0.0–0.2)

## 2022-12-20 LAB — FERRITIN: Ferritin: 60 ng/mL (ref 11–307)

## 2022-12-20 LAB — BASIC METABOLIC PANEL
Anion gap: 15 (ref 5–15)
BUN: 27 mg/dL — ABNORMAL HIGH (ref 8–23)
CO2: 21 mmol/L — ABNORMAL LOW (ref 22–32)
Calcium: 9.1 mg/dL (ref 8.9–10.3)
Chloride: 100 mmol/L (ref 98–111)
Creatinine, Ser: 1.59 mg/dL — ABNORMAL HIGH (ref 0.44–1.00)
GFR, Estimated: 33 mL/min — ABNORMAL LOW (ref >60)
Glucose, Bld: 150 mg/dL — ABNORMAL HIGH (ref 70–99)
Potassium: 4.5 mmol/L (ref 3.5–5.1)
Sodium: 136 mmol/L (ref 135–145)

## 2022-12-20 LAB — PROTIME-INR
INR: 1.1 (ref 0.8–1.2)
Prothrombin Time: 14 s (ref 11.4–15.2)

## 2022-12-20 LAB — CBG MONITORING, ED: Glucose-Capillary: 136 mg/dL — ABNORMAL HIGH (ref 70–99)

## 2022-12-20 LAB — VITAMIN B12: Vitamin B-12: 1480 pg/mL — ABNORMAL HIGH (ref 180–914)

## 2022-12-20 LAB — PREPARE RBC (CROSSMATCH)

## 2022-12-20 MED ORDER — ANASTROZOLE 1 MG PO TABS
1.0000 mg | ORAL_TABLET | Freq: Every day | ORAL | Status: DC
  Administered 2022-12-21 – 2022-12-27 (×7): 1 mg via ORAL
  Filled 2022-12-20 (×8): qty 1

## 2022-12-20 MED ORDER — SERTRALINE HCL 100 MG PO TABS
100.0000 mg | ORAL_TABLET | Freq: Every day | ORAL | Status: DC
  Administered 2022-12-20 – 2022-12-26 (×7): 100 mg via ORAL
  Filled 2022-12-20 (×7): qty 1

## 2022-12-20 MED ORDER — SODIUM CHLORIDE 0.9 % IV BOLUS
500.0000 mL | Freq: Once | INTRAVENOUS | Status: AC
  Administered 2022-12-20: 500 mL via INTRAVENOUS

## 2022-12-20 MED ORDER — OXYBUTYNIN CHLORIDE ER 10 MG PO TB24
10.0000 mg | ORAL_TABLET | Freq: Every day | ORAL | Status: DC
  Administered 2022-12-21 – 2022-12-27 (×7): 10 mg via ORAL
  Filled 2022-12-20 (×8): qty 1

## 2022-12-20 MED ORDER — SODIUM CHLORIDE 0.9 % IV SOLN: INTRAVENOUS | Status: DC

## 2022-12-20 MED ORDER — MIRABEGRON ER 25 MG PO TB24: 25.0000 mg | ORAL_TABLET | Freq: Every day | ORAL | Status: DC

## 2022-12-20 MED ORDER — TIOTROPIUM BROMIDE MONOHYDRATE 18 MCG IN CAPS: 18.0000 ug | ORAL_CAPSULE | Freq: Every evening | RESPIRATORY_TRACT | Status: DC

## 2022-12-20 MED ORDER — ISOSORBIDE MONONITRATE ER 30 MG PO TB24
60.0000 mg | ORAL_TABLET | Freq: Every day | ORAL | Status: DC
  Administered 2022-12-22 – 2022-12-27 (×6): 60 mg via ORAL
  Filled 2022-12-20 (×7): qty 2

## 2022-12-20 MED ORDER — TRIMETHOPRIM 100 MG PO TABS
100.0000 mg | ORAL_TABLET | Freq: Every day | ORAL | Status: DC
  Administered 2022-12-21 – 2022-12-27 (×7): 100 mg via ORAL
  Filled 2022-12-20 (×7): qty 1

## 2022-12-20 MED ORDER — SODIUM CHLORIDE 0.9 % IV SOLN
100.0000 mg | Freq: Once | INTRAVENOUS | Status: AC
  Administered 2022-12-20: 100 mg via INTRAVENOUS
  Filled 2022-12-20: qty 5

## 2022-12-20 MED ORDER — PANTOPRAZOLE SODIUM 40 MG IV SOLR
40.0000 mg | Freq: Once | INTRAVENOUS | Status: AC
  Administered 2022-12-20: 40 mg via INTRAVENOUS
  Filled 2022-12-20: qty 10

## 2022-12-20 MED ORDER — BUPROPION HCL ER (SR) 150 MG PO TB12
150.0000 mg | ORAL_TABLET | Freq: Every day | ORAL | Status: DC
  Administered 2022-12-21 – 2022-12-27 (×7): 150 mg via ORAL
  Filled 2022-12-20 (×7): qty 1

## 2022-12-20 MED ORDER — EZETIMIBE 10 MG PO TABS
10.0000 mg | ORAL_TABLET | Freq: Every day | ORAL | Status: DC
  Administered 2022-12-20 – 2022-12-26 (×7): 10 mg via ORAL
  Filled 2022-12-20 (×7): qty 1

## 2022-12-20 MED ORDER — ENOXAPARIN SODIUM 30 MG/0.3ML IJ SOSY: 30.0000 mg | PREFILLED_SYRINGE | INTRAMUSCULAR | Status: DC

## 2022-12-20 MED ORDER — PANTOPRAZOLE SODIUM 40 MG IV SOLR
40.0000 mg | Freq: Two times a day (BID) | INTRAVENOUS | Status: DC
  Administered 2022-12-21 – 2022-12-23 (×5): 40 mg via INTRAVENOUS
  Filled 2022-12-20 (×5): qty 10

## 2022-12-20 MED ORDER — ATORVASTATIN CALCIUM 40 MG PO TABS
40.0000 mg | ORAL_TABLET | Freq: Every day | ORAL | Status: DC
  Administered 2022-12-20 – 2022-12-26 (×7): 40 mg via ORAL
  Filled 2022-12-20 (×7): qty 1

## 2022-12-20 MED ORDER — UMECLIDINIUM BROMIDE 62.5 MCG/ACT IN AEPB
1.0000 | INHALATION_SPRAY | Freq: Every evening | RESPIRATORY_TRACT | Status: DC
  Administered 2022-12-21 – 2022-12-26 (×5): 1 via RESPIRATORY_TRACT
  Filled 2022-12-20: qty 7

## 2022-12-20 MED ORDER — POLYETHYLENE GLYCOL 3350 17 G PO PACK
17.0000 g | PACK | Freq: Every day | ORAL | Status: DC | PRN
  Administered 2022-12-27: 17 g via ORAL
  Filled 2022-12-20: qty 1

## 2022-12-20 MED ORDER — SODIUM CHLORIDE 0.9% IV SOLUTION: Freq: Once | INTRAVENOUS | Status: DC

## 2022-12-20 MED ORDER — UMECLIDINIUM BROMIDE 62.5 MCG/ACT IN AEPB
1.0000 | INHALATION_SPRAY | Freq: Every evening | RESPIRATORY_TRACT | Status: DC
  Filled 2022-12-20: qty 7

## 2022-12-20 NOTE — Plan of Care (Signed)

## 2022-12-20 NOTE — ED Triage Notes (Signed)
Hx of stroke with right sided residual. Pt was going inside the car when she got dizzy and fell forward hitting her head against the console. Denies LOC. Poor historian. Reports headache at this time. Takes Plavix. EMS reports family states multiple falls recently.

## 2022-12-20 NOTE — ED Notes (Signed)
ED TO INPATIENT HANDOFF REPORT  ED Nurse Name and Phone #: [phone]  S Name/Age/Gender Brandy Paul 77 y.o. female Room/Bed: 034C/034C  Code Status   Code Status: Full Code  Home/SNF/Other Home Patient oriented to: self, place, time, and situation Is this baseline? Yes   Triage Complete: Triage complete  Chief Complaint Symptomatic anemia [D64.9]  Triage Note Hx of stroke with right sided residual. Pt was going inside the car when she got dizzy and fell forward hitting her head against the console. Denies LOC. Poor historian. Reports headache at this time. Takes Plavix. EMS reports family states multiple falls recently.   Allergies Allergies  Allergen Reactions   Ceclor [Cefaclor] Other (See Comments)    REACTION: "SEVERE HEADACHE"    Level of Care/Admitting Diagnosis ED Disposition     ED Disposition  Admit   Condition  --   Comment  Hospital Area: [facility] [[id number]]  Level of Care: Med-Surg [16]  May admit patient to [facility] or [facility] if equivalent level of care is available:: No  Covid Evaluation: Asymptomatic - no recent exposure (last 10 days) testing not required  Diagnosis: Symptomatic anemia [[id number]]  Admitting Physician: Erenest Blank  Attending Physician: Erenest Blank  Certification:: I certify this patient will need inpatient services for at least 2 midnights  Expected Medical Readiness: 12/24/2022          B Medical/Surgery History Past Medical History:  Diagnosis Date   Adenomatous colon polyp    Anginal pain (HCC)    With coronary artery spams   Breast cancer (HCC) 2005   left   COPD (chronic obstructive pulmonary disease) (HCC)    hx of tobacco use   Coronary artery spasm (HCC)    Depression    Dyspnea    GERD (gastroesophageal reflux disease)    History of hiatal hernia    Hyperlipemia    IDA (iron deficiency anemia)    last iron infusion 4 months ago   Obesity     Osteoporosis    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)    Prediabetes    hx of   Rash    under right breast and groin line using nizoral ointment to q day per dermatology saw few weeks ago   Stress incontinence wers depends   Stroke [facility]) 2013   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   Toe fracture, left    Past Surgical History:  Procedure Laterality Date   ABDOMINAL HYSTERECTOMY  1989   complete   APPENDECTOMY  2019   with gallbladder   BIOPSY  11/11/2019   Procedure: BIOPSY;  Surgeon: Sherrilyn Rist, MD;  Location: [facility];  Service: Gastroenterology;;   BREAST BIOPSY Right 04/10/2022   [place] RT BREAST BX W LOC DEV EA ADD LESION IMG BX SPEC US GUIDE 04/10/2022 [facility]   BREAST BIOPSY Right 04/10/2022   [place] RT BREAST BX W LOC DEV 1ST LESION IMG BX SPEC US GUIDE 04/10/2022 [facility]   CATARACT EXTRACTION Bilateral    Late 1990s   CHOLECYSTECTOMY N/A 12/10/2017   Procedure: LAPAROSCOPIC CHOLECYSTECTOMY WITH INTRAOPERATIVE  ERAS PATHWAY;  Surgeon: Luretha Murphy, MD;  Location: [facility];  Service: General;  Laterality: N/A;   COLONOSCOPY WITH PROPOFOL N/A 12/03/2015   Procedure: COLONOSCOPY WITH PROPOFOL;  Surgeon: Napoleon Form, MD;  Location: [facility];  Service: Endoscopy;  Laterality: N/A;  tba before procdcedure  COLONOSCOPY WITH PROPOFOL N/A 11/11/2019   Procedure: COLONOSCOPY WITH PROPOFOL;  Surgeon: Sherrilyn Rist, MD;  Location: [facility];  Service: Gastroenterology;  Laterality: N/A;   ENTEROSCOPY N/A 11/11/2019   Procedure: ENTEROSCOPY;  Surgeon: Sherrilyn Rist, MD;  Location: [facility];  Service: Gastroenterology;  Laterality: N/A;   IR CHOLANGIOGRAM EXISTING TUBE  08/08/2017   IR CHOLANGIOGRAM EXISTING TUBE  08/27/2017   IR PERC CHOLECYSTOSTOMY  07/13/2017   LAPAROSCOPIC APPENDECTOMY N/A 12/10/2017   Procedure: APPENDECTOMY LAPAROSCOPIC;  Surgeon: Luretha Murphy, MD;  Location: [facility];   Service: General;  Laterality: N/A;   MASTECTOMY Left 2005   1 lymph node removed   MASTECTOMY W/ SENTINEL NODE BIOPSY Right 05/17/2022   Procedure: RIGHT MASTECTOMY;  Surgeon: Manus Rudd, MD;  Location: [facility];  Service: General;  Laterality: Right;   RADIAL KERATOTOMY Bilateral    ROBOTIC ASSISTED BILATERAL SALPINGO OOPHERECTOMY Bilateral 11/04/2015   Procedure: XI ROBOTIC ASSISTED BILATERAL SALPINGO OOPHORECTOMY;  Surgeon: Adolphus Birchwood, MD;  Location: [facility];  Service: Gynecology;  Laterality: Bilateral;   TONSILLECTOMY       A IV Location/Drains/Wounds Patient Lines/Drains/Airways Status     Active Line/Drains/Airways     Name Placement date Placement time Site Days   Peripheral IV 12/20/22 22 G Right Antecubital 12/20/22  1330  Antecubital  less than 1   Closed System Drain 1 Right;Lateral Breast Bulb (JP) 19 Fr. 05/17/22  1455  Breast  217   Incision - 4 Ports Abdomen Right;Lateral Right;Medial Umbilicus Left;Upper 12/10/17  0810  -- 1836            Intake/Output Last 24 hours No intake or output data in the 24 hours ending 12/20/22 1509  Labs/Imaging Results for orders placed or performed during the hospital encounter of 12/20/22 (from the past 48 hour(s))  CBG monitoring, ED     Status: Abnormal   Collection Time: 12/20/22  9:50 AM  Result Value Ref Range   Glucose-Capillary 136 (H) 70 - 99 mg/dL    Comment: Glucose reference range applies only to samples taken after fasting for at least 8 hours.  CBC     Status: Abnormal   Collection Time: 12/20/22  9:57 AM  Result Value Ref Range   WBC 10.8 (H) 4.0 - 10.5 K/uL   RBC 3.22 (L) 3.87 - 5.11 MIL/uL   Hemoglobin 7.0 (L) 12.0 - 15.0 g/dL    Comment: REPEATED TO VERIFY Reticulocyte Hemoglobin testing may be clinically indicated, consider ordering this additional test [id number]    HCT 25.5 (L) 36.0 - 46.0 %   MCV 79.2 (L) 80.0 - 100.0 fL   MCH 21.7 (L) 26.0 - 34.0 pg   MCHC 27.5 (L) 30.0 - 36.0 g/dL   RDW 95.6  (H) 21.3 - 15.5 %   Platelets 321 150 - 400 K/uL    Comment: REPEATED TO VERIFY   nRBC 0.6 (H) 0.0 - 0.2 %    Comment: Performed at [facility], [address] [place], [place] [address]  Protime-INR     Status: None   Collection Time: 12/20/22  9:57 AM  Result Value Ref Range   Prothrombin Time 14.0 11.4 - 15.2 seconds   INR 1.1 0.8 - 1.2    Comment: (NOTE) INR goal varies based on device and disease states. Performed at [facility], [address], [place], [place] [address]   Basic metabolic panel     Status: Abnormal   Collection  Time: 12/20/22  9:57 AM  Result Value Ref Range   Sodium 136 135 - 145 mmol/L   Potassium 4.5 3.5 - 5.1 mmol/L   Chloride 100 98 - 111 mmol/L   CO2 21 (L) 22 - 32 mmol/L   Glucose, Bld 150 (H) 70 - 99 mg/dL    Comment: Glucose reference range applies only to samples taken after fasting for at least 8 hours.   BUN 27 (H) 8 - 23 mg/dL   Creatinine, Ser 8.29 (H) 0.44 - 1.00 mg/dL   Calcium 9.1 8.9 - 56.2 mg/dL   GFR, Estimated 33 (L) >60 mL/min    Comment: (NOTE) Calculated using the CKD-EPI Creatinine Equation (2021)    Anion gap 15 5 - 15    Comment: Performed at [facility], [address] [place], [place] [address]  I-stat chem 8, ED (not at [facility], [facility] or [facility])     Status: Abnormal   Collection Time: 12/20/22 10:12 AM  Result Value Ref Range   Sodium 136 135 - 145 mmol/L   Potassium 4.4 3.5 - 5.1 mmol/L   Chloride 102 98 - 111 mmol/L   BUN 26 (H) 8 - 23 mg/dL   Creatinine, Ser 5.78 (H) 0.44 - 1.00 mg/dL   Glucose, Bld 469 (H) 70 - 99 mg/dL    Comment: Glucose reference range applies only to samples taken after fasting for at least 8 hours.   Calcium, Ion 1.12 (L) 1.15 - 1.40 mmol/L   TCO2 22 22 - 32 mmol/L   Hemoglobin 8.5 (L) 12.0 - 15.0 g/dL   HCT 62.9 (L) 52.8 - 41.3 %  Type and screen [facility]     Status: None   Collection Time: 12/20/22  1:00 PM  Result Value Ref Range   ABO/RH(D) A POS     Antibody Screen POS    Sample Expiration      12/23/2022,2359 Performed at [facility], [address], [place], [place] [address]   Prepare RBC (crossmatch)     Status: None   Collection Time: 12/20/22  1:00 PM  Result Value Ref Range   Order Confirmation      ORDER PROCESSED BY BLOOD BANK Performed at [facility], [address], [place], [place] [address]    CT Head Wo Contrast  Result Date: 12/20/2022 CLINICAL DATA:  Fall with trauma to the head, face and neck. EXAM: CT HEAD WITHOUT CONTRAST CT CERVICAL SPINE WITHOUT CONTRAST TECHNIQUE: Multidetector CT imaging of the head and cervical spine was performed following the standard protocol without intravenous contrast. Multiplanar CT image reconstructions of the cervical spine were also generated. RADIATION DOSE REDUCTION: This exam was performed according to the departmental dose-optimization program which includes automated exposure control, adjustment of the mA and/or kV according to patient size and/or use of iterative reconstruction technique. COMPARISON:  05/10/2021 FINDINGS: CT HEAD FINDINGS Brain: No acute finding. Old small vessel ischemic changes affect the pons. Numerous old small vessel cerebellar infarctions. Extensive old infarctions in the basal ganglia and radiating white matter tracts, more extensive on the left than the right. Old left frontal cortical and subcortical infarction. No sign of acute infarction, mass lesion, hemorrhage, hydrocephalus or extra-axial collection. Vascular: There is atherosclerotic calcification of the major vessels at the base of the brain. Skull: Negative Sinuses/Orbits: Clear/normal Other: None CT CERVICAL SPINE FINDINGS Alignment: Straightening of the normal cervical lordosis. No traumatic malalignment. Skull base and vertebrae: No fracture or focal  bone lesion. Soft tissues and spinal canal: Negative Disc levels: The foramen magnum is widely patent. There is ordinary mild osteoarthritis  of the C1-2 articulation but no encroachment upon the neural structures. C2-3: Normal interspace. C3-4 through C6-7: Spondylosis with disc space narrowing and endplate osteophytes. Mild bilateral bony foraminal stenosis. C7-T1: Normal interspace. Upper chest: Negative Other: None IMPRESSION: HEAD CT: No acute or traumatic finding. Extensive old ischemic changes as outlined above. CERVICAL SPINE CT: No acute or traumatic finding. Degenerative spondylosis C3-4 through C6-7. Electronically Signed   By: Paulina Fusi M.D.   On: 12/20/2022 12:38   CT Cervical Spine Wo Contrast  Result Date: 12/20/2022 CLINICAL DATA:  Fall with trauma to the head, face and neck. EXAM: CT HEAD WITHOUT CONTRAST CT CERVICAL SPINE WITHOUT CONTRAST TECHNIQUE: Multidetector CT imaging of the head and cervical spine was performed following the standard protocol without intravenous contrast. Multiplanar CT image reconstructions of the cervical spine were also generated. RADIATION DOSE REDUCTION: This exam was performed according to the departmental dose-optimization program which includes automated exposure control, adjustment of the mA and/or kV according to patient size and/or use of iterative reconstruction technique. COMPARISON:  05/10/2021 FINDINGS: CT HEAD FINDINGS Brain: No acute finding. Old small vessel ischemic changes affect the pons. Numerous old small vessel cerebellar infarctions. Extensive old infarctions in the basal ganglia and radiating white matter tracts, more extensive on the left than the right. Old left frontal cortical and subcortical infarction. No sign of acute infarction, mass lesion, hemorrhage, hydrocephalus or extra-axial collection. Vascular: There is atherosclerotic calcification of the major vessels at the base of the brain. Skull: Negative Sinuses/Orbits: Clear/normal Other: None CT CERVICAL SPINE FINDINGS Alignment: Straightening of the normal cervical lordosis. No traumatic malalignment. Skull base and  vertebrae: No fracture or focal bone lesion. Soft tissues and spinal canal: Negative Disc levels: The foramen magnum is widely patent. There is ordinary mild osteoarthritis of the C1-2 articulation but no encroachment upon the neural structures. C2-3: Normal interspace. C3-4 through C6-7: Spondylosis with disc space narrowing and endplate osteophytes. Mild bilateral bony foraminal stenosis. C7-T1: Normal interspace. Upper chest: Negative Other: None IMPRESSION: HEAD CT: No acute or traumatic finding. Extensive old ischemic changes as outlined above. CERVICAL SPINE CT: No acute or traumatic finding. Degenerative spondylosis C3-4 through C6-7. Electronically Signed   By: Paulina Fusi M.D.   On: 12/20/2022 12:38    Pending Labs Unresulted Labs (From admission, onward)     Start     Ordered   12/21/22 0500  CBC  Tomorrow morning,   R        12/20/22 1501   12/21/22 0500  Basic metabolic panel  Tomorrow morning,   R        12/20/22 1501   12/20/22 1502  Vitamin B12  Add-on,   AD        12/20/22 1501   12/20/22 1437  Ferritin  Add-on,   AD        12/20/22 1436   12/20/22 1025  Urinalysis, Routine w reflex microscopic -Urine, Clean Catch  Once,   URGENT       Question:  Specimen Source  Answer:  Urine, Clean Catch   12/20/22 1024            Vitals/Pain Today's Vitals   12/20/22 1145 12/20/22 1230 12/20/22 1400 12/20/22 1445  BP: (!) 109/51 (!) 109/43 (!) 104/53 (!) 107/50  Pulse: 88 83 86 90  Resp: 18 18 18 14   Temp:  TempSrc:      SpO2: 100% 98% 97% 100%  Weight:      Height:      PainSc:        Isolation Precautions No active isolations  Medications Medications  0.9 %  sodium chloride infusion (Manually program via Guardrails IV Fluids) (has no administration in time range)  trimethoprim (TRIMPEX) tablet 100 mg (has no administration in time range)  anastrozole (ARIMIDEX) tablet 1 mg (has no administration in time range)  atorvastatin (LIPITOR) tablet 40 mg (has no  administration in time range)  ezetimibe (ZETIA) tablet 10 mg (has no administration in time range)  isosorbide mononitrate (IMDUR) 24 hr tablet 60 mg (has no administration in time range)  buPROPion (WELLBUTRIN SR) 12 hr tablet 150 mg (has no administration in time range)  sertraline (ZOLOFT) tablet 100 mg (has no administration in time range)  oxybutynin (DITROPAN-XL) 24 hr tablet 10 mg (has no administration in time range)  mirabegron ER (MYRBETRIQ) tablet 25 mg (has no administration in time range)  tiotropium (SPIRIVA) inhalation capsule ([facility] use ONLY) 18 mcg (has no administration in time range)  0.9 %  sodium chloride infusion (has no administration in time range)  pantoprazole (PROTONIX) injection 40 mg (has no administration in time range)  sodium chloride 0.9 % bolus 500 mL (500 mLs Intravenous Bolus 12/20/22 1346)  pantoprazole (PROTONIX) injection 40 mg (40 mg Intravenous Given 12/20/22 1347)    Mobility walks with device     Focused Assessments Cardiac Assessment Handoff:    No results found for: "CKTOTAL", "CKMB", "CKMBINDEX", "TROPONINI" No results found for: "DDIMER" Does the Patient currently have chest pain? No    R Recommendations: See Admitting Provider Note  Report given to:   Additional Notes: everything is ready I have tubing as well.  The pt blood has antibodies so the just notified me that it will be a little longer until it is ready

## 2022-12-20 NOTE — Plan of Care (Signed)
Patient alert/oriented X3. Patient does not recall the time of year.  Patient VSS upon admission and skin assessed with Elisa, RN. Generalized bruising noted. Patient L eye swollen prior to admission from fall and has trouble opening it. SCD's on patient, will initiate blood transfusion. No complaints at this time.

## 2022-12-20 NOTE — ED Provider Notes (Signed)
I saw and evaluated the patient, reviewed the resident's note and I agree with the findings and plan.   77 year old female here mechanical fall just prior to arrival.  [employer] into the inside of the car.  No LOC.  Prior history of stroke.  On exam, does have ecchymosis noted to forehead.  She is otherwise at her neurological baseline.  She is on Plavix and will image her brain.  Patient hemoglobin here is 7 which is about a gram lower than her baseline.  Patient does have a history of gastritis.  Will check stool guaiac, start on Protonix, and likely admit   Lorre Nick, MD 12/20/22 1105

## 2022-12-20 NOTE — H&P (Addendum)
History and Physical    JADIAH BARNABA IRJ:[id number] DOB: 1945-03-23 DOA: 12/20/2022  PCP: Alysia Penna, MD  Patient coming from: home   Chief Complaint: frequent falls  HPI: MITZI BARNEY is a 77 y.o. female with medical history significant for prior cva with residual right sided weakness, copd, anemia, ghtn, dm, breast cancer, who presents with the above.  Husband reports decreased PO as of late, frequent falls. Has bruising to head and knees. Currently complaining of b/l knee pain, says this is chronic. No melena or hematochezia. Husband brought her in because of the frequent falls. Denies new focal neurologic problems. EGD in 2021 showed esophagitis, patient doesn't think she's had endoscopy since.  No vomiting or diarrhea, no chest pain, no fevers or dysuria.  Review of Systems: As per HPI otherwise 10 point review of systems negative.    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)    Prediabetes    hx of   Rash    under right breast and groin line using nizoral ointment to q day per dermatology saw few weeks ago   Stress incontinence wers depends   Stroke [facility]) 2013   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   Toe fracture, left     Past Surgical History:  Procedure Laterality Date   ABDOMINAL HYSTERECTOMY  1989   complete   APPENDECTOMY  2019   with gallbladder   BIOPSY  11/11/2019   Procedure: BIOPSY;  Surgeon: Sherrilyn Rist, MD;  Location: [facility];  Service:  Gastroenterology;;   BREAST BIOPSY Right 04/10/2022   [place] RT BREAST BX W LOC DEV EA ADD LESION IMG BX SPEC US GUIDE 04/10/2022 [facility]   BREAST BIOPSY Right 04/10/2022   [place] RT BREAST BX W LOC DEV 1ST LESION IMG BX SPEC US GUIDE 04/10/2022 [facility]   CATARACT EXTRACTION Bilateral    Late 1990s   CHOLECYSTECTOMY N/A 12/10/2017   Procedure: LAPAROSCOPIC CHOLECYSTECTOMY WITH INTRAOPERATIVE  ERAS PATHWAY;  Surgeon: Luretha Murphy, MD;  Location: [facility];  Service: General;  Laterality: N/A;   COLONOSCOPY WITH PROPOFOL N/A 12/03/2015   Procedure: COLONOSCOPY WITH PROPOFOL;  Surgeon: Napoleon Form, MD;  Location: [facility];  Service: Endoscopy;  Laterality: N/A;  tba before procdcedure   COLONOSCOPY WITH PROPOFOL N/A 11/11/2019   Procedure: COLONOSCOPY WITH PROPOFOL;  Surgeon: Sherrilyn Rist, MD;  Location: [facility];  Service: Gastroenterology;  Laterality: N/A;   ENTEROSCOPY N/A 11/11/2019   Procedure: ENTEROSCOPY;  Surgeon: Sherrilyn Rist, MD;  Location: [facility];  Service: Gastroenterology;  Laterality: N/A;   IR CHOLANGIOGRAM EXISTING TUBE  08/08/2017   IR CHOLANGIOGRAM EXISTING TUBE  08/27/2017   IR PERC CHOLECYSTOSTOMY  07/13/2017   LAPAROSCOPIC APPENDECTOMY N/A 12/10/2017   Procedure: APPENDECTOMY LAPAROSCOPIC;  Surgeon: Luretha Murphy, MD;  Location: [facility];  Service: General;  Laterality: N/A;   MASTECTOMY Left 2005   1 lymph node removed   MASTECTOMY W/ SENTINEL NODE BIOPSY Right 05/17/2022   Procedure: RIGHT MASTECTOMY;  Surgeon: Manus Rudd, MD;  Location: [facility];  Service: General;  Laterality: Right;   RADIAL KERATOTOMY Bilateral    ROBOTIC ASSISTED BILATERAL SALPINGO OOPHERECTOMY Bilateral 11/04/2015   Procedure: XI ROBOTIC ASSISTED BILATERAL SALPINGO OOPHORECTOMY;  Surgeon: Adolphus Birchwood, MD;  Location: [facility];  Service: Gynecology;  Laterality: Bilateral;   TONSILLECTOMY       reports that she quit smoking about 28 years ago. Her  smoking use included cigarettes. She has never used smokeless tobacco. She reports that she does not drink alcohol and does not use drugs.  Allergies  Allergen Reactions   Ceclor [Cefaclor] Other (See Comments)    REACTION: "SEVERE HEADACHE"    Family History  Problem Relation Age of Onset   CVA Mother    Liver cancer Mother        d. 72   Prostate cancer Father        metastatic, d. 81s   Hypertension Sister    Arthritis Sister    Diabetes Sister    Heart attack Sister    Colon polyps Sister    Cancer Brother        dx 64s, unsure of type   Stroke Maternal Grandfather    Skin cancer Son        stage V, SCC vs BCC? dx 38s   Colon cancer Neg Hx    Esophageal cancer Neg Hx    Rectal cancer Neg Hx     Prior to Admission medications   Medication Sig Start Date End Date Taking? Authorizing Provider  acetaminophen (TYLENOL) 500 MG tablet Take 500 mg by mouth daily as needed for mild pain.     [provider]  albuterol (VENTOLIN HFA) 108 (90 Base) MCG/ACT inhaler Inhale 1-2 puffs into the lungs every 6 (six) hours as needed for wheezing or shortness of breath.    [provider]  anastrozole (ARIMIDEX) 1 MG tablet Take 1 tablet (1 mg total) by mouth daily. 07/26/22   Rachel Moulds, MD  atorvastatin (LIPITOR) 40 MG tablet Take 40 mg by mouth at bedtime.  06/27/18   [provider]  buPROPion (WELLBUTRIN SR) 150 MG 12 hr tablet Take 150 mg by mouth daily. 05/18/17   [provider]  calcium carbonate (OSCAL) 1500 (600 Ca) MG TABS tablet Take 600 mg of elemental calcium by mouth daily.    [provider]  clopidogrel (PLAVIX) 75 MG tablet Take 75 mg by mouth daily. 02/16/21   [provider]  ezetimibe (ZETIA) 10 MG tablet Take 10 mg by mouth at bedtime.     [provider]  ferrous sulfate 325 (65 FE) MG tablet Take 1 tablet (325 mg total) by mouth daily with breakfast. 02/04/20   Esterwood, Amy S, PA-C  isosorbide  mononitrate (IMDUR) 60 MG 24 hr tablet Take 1 tablet (60 mg total) by mouth daily. Please make overdue appt with Dr. Katrinka Blazing before anymore refills. Thank you  3rd and final attempt 06/01/21   Lyn Records, MD  nitroGLYCERIN (NITROSTAT) 0.4 MG SL tablet Place 1 tablet (0.4 mg total) under the tongue every 5 (five) minutes as needed for chest pain (Call 911 at 3rd dose within 15 minutes). Patient not taking: Reported on 08/01/2022 11/08/17   Leone Brand, NP  oxybutynin (DITROPAN-XL) 10 MG 24 hr tablet Take  1 tablet by mouth daily.    [provider]  oxyCODONE (OXY IR/ROXICODONE) 5 MG immediate release tablet Take 1 tablet (5 mg total) by mouth every 6 (six) hours as needed for moderate pain. Patient not taking: Reported on 08/01/2022 05/18/22   Manus Rudd, MD  pantoprazole (PROTONIX) 40 MG tablet Take 1 tablet (40 mg total) by mouth daily. 02/04/20   Esterwood, Amy S, PA-C  sertraline (ZOLOFT) 100 MG tablet Take 100 mg by mouth at bedtime.  08/16/17   [provider]  tiotropium (SPIRIVA) 18 MCG inhalation capsule Place 18 mcg into inhaler and inhale every evening.    [provider]  trimethoprim (TRIMPEX) 100 MG tablet Take 100 mg by mouth daily.    [provider]  Vibegron (GEMTESA) 75 MG TABS Take 75 mg by mouth daily.    [provider]  vitamin B-12 1000 MCG tablet Take 1 tablet (1,000 mcg total) by mouth daily. 10/02/20   Lonia Blood, MD    Physical Exam: Vitals:   12/20/22 1000 12/20/22 1045 12/20/22 1145 12/20/22 1230  BP: (!) 104/50 (!) 101/52 (!) 109/51 (!) 109/43  Pulse: 88 84 88 83  Resp: 16 17 18 18   Temp:      TempSrc:      SpO2: 96% 98% 100% 98%  Weight:      Height:        Constitutional: No acute distress, chronically ill appearing Head: bruising forehead and left side of face Eyes: Conjunctiva clear ENM: Moist mucous membranes. Normal dentition.  Neck: Supple Respiratory: Clear to auscultation bilaterally, no  wheezing/rales/rhonchi. Normal respiratory effort. No accessory muscle use. . Cardiovascular: Regular rate and rhythm. No murmurs/rubs/gallops. Abdomen: Non-tender, obese. No masses. No rebound or guarding.   Musculoskeletal: contractures on right Skin: bruising on face as above, also lower extremities.  Extremities: LE edema to knees.   Neurologic: Alert, right sided weakness Psychiatric: Normal insight and judgement.   Labs on Admission: I have personally reviewed following labs and imaging studies  CBC: Recent Labs  Lab 12/20/22 0957 12/20/22 1012  WBC 10.8*  --   HGB 7.0* 8.5*  HCT 25.5* 25.0*  MCV 79.2*  --   PLT 321  --    Basic Metabolic Panel: Recent Labs  Lab 12/20/22 0957 12/20/22 1012  NA 136 136  K 4.5 4.4  CL 100 102  CO2 21*  --   GLUCOSE 150* 151*  BUN 27* 26*  CREATININE 1.59* 1.70*  CALCIUM 9.1  --    GFR: Estimated Creatinine Clearance: 29.6 mL/min (A) (by C-G formula based on SCr of 1.7 mg/dL (H)). Liver Function Tests: No results for input(s): "AST", "ALT", "ALKPHOS", "BILITOT", "PROT", "ALBUMIN" in the last 168 hours. No results for input(s): "LIPASE", "AMYLASE" in the last 168 hours. No results for input(s): "AMMONIA" in the last 168 hours. Coagulation Profile: Recent Labs  Lab 12/20/22 0957  INR 1.1   Cardiac Enzymes: No results for input(s): "CKTOTAL", "CKMB", "CKMBINDEX", "TROPONINI" in the last 168 hours. BNP (last 3 results) No results for input(s): "PROBNP" in the last 8760 hours. HbA1C: No results for input(s): "HGBA1C" in the last 72 hours. CBG: Recent Labs  Lab 12/20/22 0950  GLUCAP 136*   Lipid Profile: No results for input(s): "CHOL", "HDL", "LDLCALC", "TRIG", "CHOLHDL", "LDLDIRECT" in the last 72 hours. Thyroid Function Tests: No results for input(s): "TSH", "T4TOTAL", "FREET4", "T3FREE", "THYROIDAB" in the last 72 hours. Anemia Panel: No results for input(s): "VITAMINB12", "FOLATE", "FERRITIN", "TIBC", "  IRON",  "RETICCTPCT" in the last 72 hours. Urine analysis:    Component Value Date/Time   COLORURINE YELLOW 09/17/2017 1256   APPEARANCEUR CLEAR 09/17/2017 1256   LABSPEC 1.019 09/17/2017 1256   PHURINE 5.0 09/17/2017 1256   GLUCOSEU NEGATIVE 09/17/2017 1256   HGBUR NEGATIVE 09/17/2017 1256   BILIRUBINUR NEGATIVE 09/17/2017 1256   KETONESUR NEGATIVE 09/17/2017 1256   PROTEINUR NEGATIVE 09/17/2017 1256   NITRITE NEGATIVE 09/17/2017 1256   LEUKOCYTESUR NEGATIVE 09/17/2017 1256    Radiological Exams on Admission: CT Head Wo Contrast  Result Date: 12/20/2022 CLINICAL DATA:  Fall with trauma to the head, face and neck. EXAM: CT HEAD WITHOUT CONTRAST CT CERVICAL SPINE WITHOUT CONTRAST TECHNIQUE: Multidetector CT imaging of the head and cervical spine was performed following the standard protocol without intravenous contrast. Multiplanar CT image reconstructions of the cervical spine were also generated. RADIATION DOSE REDUCTION: This exam was performed according to the departmental dose-optimization program which includes automated exposure control, adjustment of the mA and/or kV according to patient size and/or use of iterative reconstruction technique. COMPARISON:  05/10/2021 FINDINGS: CT HEAD FINDINGS Brain: No acute finding. Old small vessel ischemic changes affect the pons. Numerous old small vessel cerebellar infarctions. Extensive old infarctions in the basal ganglia and radiating white matter tracts, more extensive on the left than the right. Old left frontal cortical and subcortical infarction. No sign of acute infarction, mass lesion, hemorrhage, hydrocephalus or extra-axial collection. Vascular: There is atherosclerotic calcification of the major vessels at the base of the brain. Skull: Negative Sinuses/Orbits: Clear/normal Other: None CT CERVICAL SPINE FINDINGS Alignment: Straightening of the normal cervical lordosis. No traumatic malalignment. Skull base and vertebrae: No fracture or focal bone  lesion. Soft tissues and spinal canal: Negative Disc levels: The foramen magnum is widely patent. There is ordinary mild osteoarthritis of the C1-2 articulation but no encroachment upon the neural structures. C2-3: Normal interspace. C3-4 through C6-7: Spondylosis with disc space narrowing and endplate osteophytes. Mild bilateral bony foraminal stenosis. C7-T1: Normal interspace. Upper chest: Negative Other: None IMPRESSION: HEAD CT: No acute or traumatic finding. Extensive old ischemic changes as outlined above. CERVICAL SPINE CT: No acute or traumatic finding. Degenerative spondylosis C3-4 through C6-7. Electronically Signed   By: Paulina Fusi M.D.   On: 12/20/2022 12:38   CT Cervical Spine Wo Contrast  Result Date: 12/20/2022 CLINICAL DATA:  Fall with trauma to the head, face and neck. EXAM: CT HEAD WITHOUT CONTRAST CT CERVICAL SPINE WITHOUT CONTRAST TECHNIQUE: Multidetector CT imaging of the head and cervical spine was performed following the standard protocol without intravenous contrast. Multiplanar CT image reconstructions of the cervical spine were also generated. RADIATION DOSE REDUCTION: This exam was performed according to the departmental dose-optimization program which includes automated exposure control, adjustment of the mA and/or kV according to patient size and/or use of iterative reconstruction technique. COMPARISON:  05/10/2021 FINDINGS: CT HEAD FINDINGS Brain: No acute finding. Old small vessel ischemic changes affect the pons. Numerous old small vessel cerebellar infarctions. Extensive old infarctions in the basal ganglia and radiating white matter tracts, more extensive on the left than the right. Old left frontal cortical and subcortical infarction. No sign of acute infarction, mass lesion, hemorrhage, hydrocephalus or extra-axial collection. Vascular: There is atherosclerotic calcification of the major vessels at the base of the brain. Skull: Negative Sinuses/Orbits: Clear/normal Other:  None CT CERVICAL SPINE FINDINGS Alignment: Straightening of the normal cervical lordosis. No traumatic malalignment. Skull base and vertebrae: No fracture or focal bone lesion. Soft  tissues and spinal canal: Negative Disc levels: The foramen magnum is widely patent. There is ordinary mild osteoarthritis of the C1-2 articulation but no encroachment upon the neural structures. C2-3: Normal interspace. C3-4 through C6-7: Spondylosis with disc space narrowing and endplate osteophytes. Mild bilateral bony foraminal stenosis. C7-T1: Normal interspace. Upper chest: Negative Other: None IMPRESSION: HEAD CT: No acute or traumatic finding. Extensive old ischemic changes as outlined above. CERVICAL SPINE CT: No acute or traumatic finding. Degenerative spondylosis C3-4 through C6-7. Electronically Signed   By: Paulina Fusi M.D.   On: 12/20/2022 12:38     Assessment/Plan Principal Problem:   Symptomatic anemia Active Problems:   CVA, old, aphasia   T2DM (type 2 diabetes mellitus) (HCC)   Anemia   Essential hypertension   COPD (chronic obstructive pulmonary disease) (HCC)   Malignant neoplasm of central portion of right breast in female, estrogen receptor positive (HCC)   # Frequent falls Likely 2/2 prior stroke, anemia. Ct head/neck nothing acute - will check MRI to r/o acute/subacute stroke  # Anemia, microcytic Hx of this, attributed to occult gi bleeding. Hgb in the 7s earlier this year prior to mastectomy, today it's 7. Denies melena/hematochezia or other bleeding. Is on plavix, denies nsaids - 1 unit ordered - GI consulted - PPI IV BID - gentle fluids - npo pending GI consult  # Knee pain B/l. Chronic pain but also frequent falls with bruising of LEs - x-ray bilateral knees  # AKI  Likely prerenal 2/2 decreased po. Cr 1.59 from baseline of 1.2 - gentle fluids  # Hx CVA With residual right sided weakness - home zetia and atorva - plavix on hold  # Frequent uti - home trimethoprim  #  MDD - home wellbutrin, sertraline  # OAB - home oxytubynin   # COPD Quiescent - home tiotrop  # Hx breast cancer S/p definitive surgery - home anastrozole  DVT prophylaxis: scds Code Status: full  Family Communication: husband updated @ bedside  Consults called: GI  Level of care: Med-Surg    Silvano Bilis MD [facility] Pager [phone]  If 7PM-7AM, please contact night-coverage [email or web address] Password [email or web address]  12/20/2022, 3:02 PM

## 2022-12-20 NOTE — ED Provider Notes (Signed)
[place] [facility]ERGENCY DEPARTMENT AT [facility] Provider Note   CSN: [id number] Arrival date & time: 12/20/22  0944     History Chief Complaint  Patient presents with   Fall    HPI Brandy Paul is a 77 y.o. female presenting for evaluation after multiple falls. Pt has arthiritis of the left knee, and got a series of injections onto the knee by sports medicine in August. Since her last third injection then, she has been having difficulty moving her left leg. Husband states that she has fallen over 5 times in a day this past week. Her left leg is weak and she has residual right weakness from her prior stroke. She hit her face in the past, but this is the first time that she has hit her head. Her left leg got weak and she hit the console of the car in the parking lot.    She denies any LOC.   Patient's recorded medical, surgical, social, medication list and allergies were reviewed in the Snapshot window as part of the initial history.   Review of Systems   Review of Systems  Constitutional:  Negative for appetite change, chills and fever.  HENT:  Positive for facial swelling. Negative for congestion and nosebleeds.   Eyes:  Positive for discharge. Negative for redness and visual disturbance.  Respiratory:  Negative for chest tightness, shortness of breath and wheezing.   Cardiovascular:  Negative for chest pain, palpitations and leg swelling.  Gastrointestinal:  Positive for constipation. Negative for abdominal pain, blood in stool, nausea and vomiting.  Genitourinary:  Positive for urgency. Negative for dysuria, frequency, hematuria and vaginal bleeding.  Musculoskeletal:  Positive for arthralgias and gait problem. Negative for back pain.  Neurological:  Positive for tremors and weakness. Negative for dizziness and speech difficulty.    Physical Exam Updated Vital Signs BP (!) 109/43   Pulse 83   Temp 97.8 F (36.6 C) (Axillary)   Resp 18   Ht 5\' 3"  (1.6 m)   Wt  90.7 kg   SpO2 98%   BMI 35.42 kg/m  Physical Exam Constitutional:      General: She is not in acute distress.    Appearance: She is not ill-appearing or toxic-appearing.  Cardiovascular:     Rate and Rhythm: Normal rate and regular rhythm.  Pulmonary:     Effort: No respiratory distress.     Breath sounds: No wheezing, rhonchi or rales.  Abdominal:     General: There is no distension.     Tenderness: There is abdominal tenderness. There is no guarding or rebound.  Musculoskeletal:        General: No deformity.     Right lower leg: No edema.     Left lower leg: No edema.  Skin:    Findings: Bruising present.  Neurological:     Mental Status: She is oriented to person, place, and time.     Motor: Weakness (Residual weakness on R side, cannot move against gravity. 5/5 strength on LLE) present.    ED Course/ Medical Decision Making/ A&P   Procedures Procedures   Medications Ordered in ED Medications  0.9 %  sodium chloride infusion (Manually program via Guardrails IV Fluids) (has no administration in time range)  sodium chloride 0.9 % bolus 500 mL (500 mLs Intravenous Bolus 12/20/22 1346)  pantoprazole (PROTONIX) injection 40 mg (40 mg Intravenous Given 12/20/22 1347)    Medical Decision Making:    Roger Shelter is a  77 y.o. female who presented to the ED today for evaluation after multiple falls in the course of the last week, the most recent resulting in her hitting her head.  Complete initial physical exam performed, notably the patient had a bruise on her head and and left eye, she was also pale.   Reviewed and confirmed nursing documentation for past medical history, family history, social history.    Initial Assessment:   With the patient's presentation with paleness and weakness and history of gastritis, the patient may be having a bleed. She fell against the console of the car, but is on anticoagulation and has bruising on the head. With worsening falls, which  could be attributed to the weakness on the legs, she may be having a subdural hematoma or hydrocephalus. EOMI, but bruising along the eyelid although not new, with the history of "many falls, >5 in one day since Sept 20th), a CT scan of her neck would be needed. She has no tenderness to palpation along the thoracic or lumbar spines. No other motor deficits besides her residual Right sided weakness. Cognition is slightly worse than her baseline at this time. Her tenderness over the suprapubic region and increase in Cr may be secondary to a UTI.  Initial Plan:  CT head and neck w/o contrast due to trauma after multiple falls. Screening labs including CBC and Metabolic panel to evaluate for infectious or metabolic etiology of disease.  Urinalysis with reflex culture ordered to evaluate for UTI or relevant urologic/nephrologic pathology.  EKG to evaluate for cardiac pathology. Objective evaluation as below reviewed with plan for close reassessment  Initial Study Results:   Laboratory  All laboratory results reviewed without evidence of clinically relevant pathology.    Cr is increased at 1.59 (baseline ~1.0)  Hemoglobin is 7.0 (baseline closer to 8.0)   EKG EKG was reviewed independently. PVCs.    Radiology  All images reviewed independently.  Agree with radiology report at this time.    CT Head Wo Contrast  Result Date: 12/20/2022 CLINICAL DATA:  Fall with trauma to the head, face and neck. EXAM: CT HEAD WITHOUT CONTRAST CT CERVICAL SPINE WITHOUT CONTRAST TECHNIQUE: Multidetector CT imaging of the head and cervical spine was performed following the standard protocol without intravenous contrast. Multiplanar CT image reconstructions of the cervical spine were also generated. RADIATION DOSE REDUCTION: This exam was performed according to the departmental dose-optimization program which includes automated exposure control, adjustment of the mA and/or kV according to patient size and/or use of  iterative reconstruction technique. COMPARISON:  05/10/2021 FINDINGS: CT HEAD FINDINGS Brain: No acute finding. Old small vessel ischemic changes affect the pons. Numerous old small vessel cerebellar infarctions. Extensive old infarctions in the basal ganglia and radiating white matter tracts, more extensive on the left than the right. Old left frontal cortical and subcortical infarction. No sign of acute infarction, mass lesion, hemorrhage, hydrocephalus or extra-axial collection. Vascular: There is atherosclerotic calcification of the major vessels at the base of the brain. Skull: Negative Sinuses/Orbits: Clear/normal Other: None CT CERVICAL SPINE FINDINGS Alignment: Straightening of the normal cervical lordosis. No traumatic malalignment. Skull base and vertebrae: No fracture or focal bone lesion. Soft tissues and spinal canal: Negative Disc levels: The foramen magnum is widely patent. There is ordinary mild osteoarthritis of the C1-2 articulation but no encroachment upon the neural structures. C2-3: Normal interspace. C3-4 through C6-7: Spondylosis with disc space narrowing and endplate osteophytes. Mild bilateral bony foraminal stenosis. C7-T1: Normal interspace. Upper chest: Negative  Other: None IMPRESSION: HEAD CT: No acute or traumatic finding. Extensive old ischemic changes as outlined above. CERVICAL SPINE CT: No acute or traumatic finding. Degenerative spondylosis C3-4 through C6-7. Electronically Signed   By: Paulina Fusi M.D.   On: 12/20/2022 12:38   CT Cervical Spine Wo Contrast  Result Date: 12/20/2022 CLINICAL DATA:  Fall with trauma to the head, face and neck. EXAM: CT HEAD WITHOUT CONTRAST CT CERVICAL SPINE WITHOUT CONTRAST TECHNIQUE: Multidetector CT imaging of the head and cervical spine was performed following the standard protocol without intravenous contrast. Multiplanar CT image reconstructions of the cervical spine were also generated. RADIATION DOSE REDUCTION: This exam was performed  according to the departmental dose-optimization program which includes automated exposure control, adjustment of the mA and/or kV according to patient size and/or use of iterative reconstruction technique. COMPARISON:  05/10/2021 FINDINGS: CT HEAD FINDINGS Brain: No acute finding. Old small vessel ischemic changes affect the pons. Numerous old small vessel cerebellar infarctions. Extensive old infarctions in the basal ganglia and radiating white matter tracts, more extensive on the left than the right. Old left frontal cortical and subcortical infarction. No sign of acute infarction, mass lesion, hemorrhage, hydrocephalus or extra-axial collection. Vascular: There is atherosclerotic calcification of the major vessels at the base of the brain. Skull: Negative Sinuses/Orbits: Clear/normal Other: None CT CERVICAL SPINE FINDINGS Alignment: Straightening of the normal cervical lordosis. No traumatic malalignment. Skull base and vertebrae: No fracture or focal bone lesion. Soft tissues and spinal canal: Negative Disc levels: The foramen magnum is widely patent. There is ordinary mild osteoarthritis of the C1-2 articulation but no encroachment upon the neural structures. C2-3: Normal interspace. C3-4 through C6-7: Spondylosis with disc space narrowing and endplate osteophytes. Mild bilateral bony foraminal stenosis. C7-T1: Normal interspace. Upper chest: Negative Other: None IMPRESSION: HEAD CT: No acute or traumatic finding. Extensive old ischemic changes as outlined above. CERVICAL SPINE CT: No acute or traumatic finding. Degenerative spondylosis C3-4 through C6-7. Electronically Signed   By: Paulina Fusi M.D.   On: 12/20/2022 12:38     Consults:  Gastroenterology.   Reassessment and Plan:    Pt with multiple falls and progressive weakness. Pale on exam.  Has history of IDA, erosive gastritis and hiatal hernia per EGD on 11/11/2019, positive FOBT in the past. Was previously on Protonix, and on current medication  list. Husband unable to tell me if she is currently taking it. Protonix 40 mg given today once. Hemoglobin at baseline seems to be closer to 8. It is 7 today. Verbal consent for blood transfusion obtained from patient and husband. Patient consented to receive blood. 1 unit of pRBC ordered.  Admitted to Hospitalist team.  Clinical Impression:  1. Fall, initial encounter     Admit   Final Clinical Impression(s) / ED Diagnoses Final diagnoses:  Fall, initial encounter    Rx / DC Orders ED Discharge Orders     None         Hassan Rowan, [place], MD 12/20/22 1449    Lorre Nick, MD 12/21/22 [phone]

## 2022-12-20 NOTE — Consult Note (Addendum)
Consultation Note   Referring Provider:  Teaching Service PCP: Alysia Penna, MD Primary Gastroenterologist: Marsa Aris, MD        Reason for Consultation: Worsening of chronic anemia   DOA: 12/20/2022         Hospital Day: 1   ASSESSMENT    Brief Narrative:  77 y.o. 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  status post mastectomy, iron deficiency anemia, GERD and colon polyps. Admitted 10/1 due to multiple falls at home. Has AKI and ? Worsening of chronic anemia.   Worsening of chronic iron deficiency anemia. IDA possibly secondary to gastritis and / or could have Cameron's erosions given large hiatal hernia.  Bowel prep was only fair but no polyps on Aug 2021 colonoscopy Stools chronically dark ( on or off iron).  Hgb 7, per husband it was just under 11 in January. Stopped oral iron a month or so ago which is most likely why her hgb has declined.   Occasional minor rectal bleeding in setting of constipation. Likely hemorrhoidal.   Hx of CVA on plavix.  Last dose was 12/19/22.  Recurrent falls at home. Evaluation for etiology is in progress.  No acute findings on head CT scan     PLAN:   -IV iron -? Does she still need the ordered unit of blood? Hgb up from 7 to 8.5 on repeat labs? -She needs to stay on oral iron at home -BID PPI - Possibly may require an EGD ( may need to be diagnostic only since last dose of plavix was just last night). Not likely to need repeat colonoscopy -Miralax prn    HPI   Presented to ED today with dizziness / multiple recent falls at home  Previous GI Evaluations   Colonoscopy 12/03/2015 done due to IDA and a + Cologuard Test: - One 15 mm TA polyp in the descending colon, removed with a hot snare. Resected and retrieved. - One 3 mm TA polyp in the transverse colon, removed with a cold biopsy  forceps. Resected and retrieved. - Diverticulosis in the sigmoid colon and in the descending colon. - Non-bleeding internal hemorrhoids  Colonoscopy Aug 2021 for IDA - Preparation of the colon was fair. - Decreased sphincter tone found on digital rectal exam. - The examined portion of the ileum was normal. - Diverticulosis in the left colon. - Stool in the sigmoid colon. - The examination was otherwise normal on direct and retroflexion views. - No specimens collected.  Small bowel endoscopy Aug 2021 for IDA -Examined portion of jejunum was normal.   Normal examined duodenum. - Erythema and erosion and thickened folds mucosa in the gastric fundus. Biopsied. - Large hiatal hernia. Intrathoracic stomach.   A. GASTRIC, FUNDUS, BIOPSY:  - Gastric oxyntic mucosa with parietal cell hyperplasia as can be seen  in hypergastrinemic states such as PPI therapy.  - Warthin Starry stain is negative for Helicobacter pylori   B. GASTRIC, ANTRUM, BIOPSY:  - Gastric antral mucosa with no specific histopathologic changes  - Warthin Starry stain is negative for Helicobacter pylori     Labs and Imaging: Recent Labs    12/20/22  0957 12/20/22 1012  WBC 10.8*  --   HGB 7.0* 8.5*  HCT 25.5* 25.0*  PLT 321  --    Recent Labs    12/20/22 0957 12/20/22 1012  NA 136 136  K 4.5 4.4  CL 100 102  CO2 21*  --   GLUCOSE 150* 151*  BUN 27* 26*  CREATININE 1.59* 1.70*  CALCIUM 9.1  --    No results for input(s): "PROT", "ALBUMIN", "AST", "ALT", "ALKPHOS", "BILITOT", "BILIDIR", "IBILI" in the last 72 hours. No results for input(s): "HEPBSAG", "HCVAB", "HEPAIGM", "HEPBIGM" in the last 72 hours. Recent Labs    12/20/22 0957  LABPROT 14.0  INR 1.1      Past Medical History:  Diagnosis Date   Adenomatous colon polyp    Anginal pain (HCC)    With coronary artery spams   Breast cancer (HCC) 2005   left   COPD (chronic obstructive pulmonary disease) (HCC)    hx of tobacco use   Coronary artery  spasm (HCC)    Depression    Dyspnea    GERD (gastroesophageal reflux disease)    History of hiatal hernia    Hyperlipemia    IDA (iron deficiency anemia)    last iron infusion 4 months ago   Obesity    Osteoporosis    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)    Prediabetes    hx of   Rash    under right breast and groin line using nizoral ointment to q day per dermatology saw few weeks ago   Stress incontinence wers depends   Stroke [facility]) 2013   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   Toe fracture, left     Past Surgical History:  Procedure Laterality Date   ABDOMINAL HYSTERECTOMY  1989   complete   APPENDECTOMY  2019   with gallbladder   BIOPSY  11/11/2019   Procedure: BIOPSY;  Surgeon: Sherrilyn Rist, MD;  Location: [facility];  Service: Gastroenterology;;   BREAST BIOPSY Right 04/10/2022   [place] RT BREAST BX W LOC DEV EA ADD LESION IMG BX SPEC US GUIDE 04/10/2022 [facility]   BREAST BIOPSY Right 04/10/2022   [place] RT BREAST BX W LOC DEV 1ST LESION IMG BX SPEC US GUIDE 04/10/2022 [facility]   CATARACT EXTRACTION Bilateral    Late 1990s   CHOLECYSTECTOMY N/A 12/10/2017   Procedure: LAPAROSCOPIC CHOLECYSTECTOMY WITH INTRAOPERATIVE  ERAS PATHWAY;  Surgeon: Luretha Murphy, MD;  Location: [facility];  Service: General;  Laterality: N/A;   COLONOSCOPY WITH PROPOFOL N/A 12/03/2015   Procedure: COLONOSCOPY WITH PROPOFOL;  Surgeon: Napoleon Form, MD;  Location: [facility];  Service: Endoscopy;  Laterality: N/A;  tba before procdcedure   COLONOSCOPY WITH PROPOFOL N/A 11/11/2019   Procedure: COLONOSCOPY WITH PROPOFOL;  Surgeon: Sherrilyn Rist, MD;  Location: [facility];  Service: Gastroenterology;  Laterality: N/A;   ENTEROSCOPY N/A 11/11/2019   Procedure: ENTEROSCOPY;  Surgeon: Sherrilyn Rist, MD;  Location: [facility];  Service: Gastroenterology;  Laterality: N/A;   IR CHOLANGIOGRAM EXISTING TUBE  08/08/2017   IR  CHOLANGIOGRAM EXISTING TUBE  08/27/2017   IR PERC CHOLECYSTOSTOMY  07/13/2017   LAPAROSCOPIC APPENDECTOMY N/A 12/10/2017   Procedure: APPENDECTOMY LAPAROSCOPIC;  Surgeon: Luretha Murphy, MD;  Location: [facility];  Service: General;  Laterality: N/A;   MASTECTOMY Left 2005   1 lymph node removed   MASTECTOMY W/ SENTINEL NODE BIOPSY Right 05/17/2022   Procedure: RIGHT  MASTECTOMY;  Surgeon: Manus Rudd, MD;  Location: [facility];  Service: General;  Laterality: Right;   RADIAL KERATOTOMY Bilateral    ROBOTIC ASSISTED BILATERAL SALPINGO OOPHERECTOMY Bilateral 11/04/2015   Procedure: XI ROBOTIC ASSISTED BILATERAL SALPINGO OOPHORECTOMY;  Surgeon: Adolphus Birchwood, MD;  Location: [facility];  Service: Gynecology;  Laterality: Bilateral;   TONSILLECTOMY      Family History  Problem Relation Age of Onset   CVA Mother    Liver cancer Mother        d. 63   Prostate cancer Father        metastatic, d. 71s   Hypertension Sister    Arthritis Sister    Diabetes Sister    Heart attack Sister    Colon polyps Sister    Cancer Brother        dx 55s, unsure of type   Stroke Maternal Grandfather    Skin cancer Son        stage V, SCC vs BCC? dx 1s   Colon cancer Neg Hx    Esophageal cancer Neg Hx    Rectal cancer Neg Hx     Prior to Admission medications   Medication Sig Start Date End Date Taking? Authorizing Provider  acetaminophen (TYLENOL) 500 MG tablet Take 500 mg by mouth daily as needed for mild pain.    Yes [provider]  albuterol (VENTOLIN HFA) 108 (90 Base) MCG/ACT inhaler Inhale 1 puff into the lungs every evening.   Yes [provider]  anastrozole (ARIMIDEX) 1 MG tablet Take 1 tablet (1 mg total) by mouth daily. Patient taking differently: Take 1 mg by mouth every evening. 07/26/22  Yes Rachel Moulds, MD  atorvastatin (LIPITOR) 40 MG tablet Take 40 mg by mouth at bedtime.  06/27/18  Yes [provider]  buPROPion (WELLBUTRIN SR) 150 MG 12 hr tablet Take 150 mg by  mouth daily. 05/18/17  Yes [provider]  calcium carbonate (OSCAL) 1500 (600 Ca) MG TABS tablet Take 600 mg of elemental calcium by mouth daily.   Yes [provider]  clopidogrel (PLAVIX) 75 MG tablet Take 75 mg by mouth daily. 02/16/21  Yes [provider]  ezetimibe (ZETIA) 10 MG tablet Take 10 mg by mouth at bedtime.    Yes [provider]  ferrous sulfate 325 (65 FE) MG tablet Take 1 tablet (325 mg total) by mouth daily with breakfast. 02/04/20  Yes Esterwood, Amy S, PA-C  isosorbide mononitrate (IMDUR) 60 MG 24 hr tablet Take 1 tablet (60 mg total) by mouth daily. Please make overdue appt with Dr. Katrinka Blazing before anymore refills. Thank you  3rd and final attempt 06/01/21  Yes Lyn Records, MD  nitroGLYCERIN (NITROSTAT) 0.4 MG SL tablet Place 1 tablet (0.4 mg total) under the tongue every 5 (five) minutes as needed for chest pain (Call 911 at 3rd dose within 15 minutes). 11/08/17  Yes Leone Brand, NP  oxybutynin (DITROPAN-XL) 10 MG 24 hr tablet Take 1 tablet by mouth daily.   Yes [provider]  oxyCODONE (OXY IR/ROXICODONE) 5 MG immediate release tablet Take 1 tablet (5 mg total) by mouth every 6 (six) hours as needed for moderate pain. 05/18/22  Yes Manus Rudd, MD  pantoprazole (PROTONIX) 40 MG tablet Take 1 tablet (40 mg total) by mouth daily. 02/04/20  Yes Esterwood, Amy S, PA-C  sertraline (ZOLOFT) 100 MG tablet Take 100 mg by mouth at bedtime.  08/16/17  Yes [provider]  tiotropium (SPIRIVA)  18 MCG inhalation capsule Place 18 mcg into inhaler and inhale every evening.   Yes [provider]  trimethoprim (TRIMPEX) 100 MG tablet Take 100 mg by mouth daily.   Yes [provider]  vitamin B-12 1000 MCG tablet Take 1 tablet (1,000 mcg total) by mouth daily. 10/02/20  Yes Lonia Blood, MD  Vibegron (GEMTESA) 75 MG TABS Take 75 mg by mouth daily. Patient not taking: Reported on 12/20/2022    [provider]    Current Facility-Administered Medications  Medication Dose Route Frequency Provider Last Rate Last Admin   0.9 %  sodium chloride infusion (Manually program via Guardrails IV Fluids)   Intravenous Once Wouk, Wilfred Curtis, MD       0.9 %  sodium chloride infusion   Intravenous Continuous Wouk, Wilfred Curtis, MD       Melene Muller ON 12/21/2022] anastrozole (ARIMIDEX) tablet 1 mg  1 mg Oral Daily Wouk, Wilfred Curtis, MD       atorvastatin (LIPITOR) tablet 40 mg  40 mg Oral QHS Wouk, Wilfred Curtis, MD       [START ON 12/21/2022] buPROPion [facility] SR) 12 hr tablet 150 mg  150 mg Oral Daily Wouk, Wilfred Curtis, MD       ezetimibe (ZETIA) tablet 10 mg  10 mg Oral QHS Wouk, Wilfred Curtis, MD       [START ON 12/21/2022] isosorbide mononitrate (IMDUR) 24 hr tablet 60 mg  60 mg Oral Daily Wouk, Wilfred Curtis, MD       mirabegron [facility]) tablet 25 mg  25 mg Oral Daily Wouk, Wilfred Curtis, MD       oxybutynin (DITROPAN-XL) 24 hr tablet 10 mg  10 mg Oral Daily Wouk, Wilfred Curtis, MD       [START ON 12/21/2022] pantoprazole (PROTONIX) injection 40 mg  40 mg Intravenous Q12H Wouk, Wilfred Curtis, MD       sertraline (ZOLOFT) tablet 100 mg  100 mg Oral QHS Wouk, Wilfred Curtis, MD       tiotropium [facility]) inhalation capsule [facility] use ONLY) 18 mcg  18 mcg Inhalation QPM Wouk, Wilfred Curtis, MD       [START ON 12/21/2022] trimethoprim (TRIMPEX) tablet 100 mg  100 mg Oral Daily Wouk, Wilfred Curtis, MD        Allergies as of 12/20/2022 - Review Complete 12/20/2022  Allergen Reaction Noted   Ceclor [cefaclor] Other (See Comments) 03/10/2011    Social History   Socioeconomic History   Marital status: Married    Spouse name: Merlyn Albert   Number of children: 4   Years of education: 21   Highest education level: Not on file  Occupational History   Occupation: retired  Tobacco Use   Smoking status: Former    Current packs/day: 0.00    Types: Cigarettes    Quit date: 02/17/1994    Years since quitting: 28.8    Smokeless tobacco: Never  Vaping Use   Vaping status: Never Used  Substance and Sexual Activity   Alcohol use: No    Alcohol/week: 0.0 standard drinks of alcohol   Drug use: No   Sexual activity: Not Currently  Other Topics Concern   Not on file  Social History Narrative   Lives with spouse   1-2 cups coffee   Social Determinants of Health   Financial Resource Strain: Not on file  Food Insecurity: Not on file  Transportation Needs: Not on file  Physical Activity: Not on file  Stress: Not on  file  Social Connections: Not on file  Intimate Partner Violence: Not on file     Code Status   Code Status: Full Code  Review of Systems: All systems reviewed and negative except where noted in HPI.  Physical Exam: Vital signs in last 24 hours: Temp:  [97.8 F (36.6 C)] 97.8 F (36.6 C) (10/02 0946) Pulse Rate:  [83-93] 87 (10/02 1530) Resp:  [14-18] 17 (10/02 1530) BP: (100-110)/(43-53) 100/46 (10/02 1530) SpO2:  [96 %-100 %] 100 % (10/02 1530) Weight:  [90.7 kg] 90.7 kg (10/02 0951)    General:  Pleasant obese female in NAD Psych:  Cooperative. Normal mood and affect Eyes: Pupils equal Ears:  Normal auditory acuity Nose: No deformity, discharge or lesions Neck:  Supple, no masses felt Lungs:  A few bibasilar crackles.  Heart:  Regular rate, regular rhythm.  Abdomen:  Soft, nondistended, nontender, active bowel sounds, no masses felt Rectal :  Deferred Msk: Symmetrical without gross deformities.  Neurologic:  Alert, oriented, grossly normal neurologically  Intake/Output from previous day: No intake/output data recorded. Intake/Output this shift:  No intake/output data recorded.  Principal Problem:   Symptomatic anemia Active Problems:   CVA, old, aphasia   T2DM (type 2 diabetes mellitus) (HCC)   Anemia   Essential hypertension   COPD (chronic obstructive pulmonary disease) (HCC)   Malignant neoplasm of central portion of right breast in female, estrogen  receptor positive (HCC)    Willette Cluster, NP-C   12/20/2022, 4:37 PM

## 2022-12-21 DIAGNOSIS — D649 Anemia, unspecified: Secondary | ICD-10-CM | POA: Diagnosis not present

## 2022-12-21 LAB — CBC
HCT: 23 % — ABNORMAL LOW (ref 36.0–46.0)
Hemoglobin: 6.7 g/dL — CL (ref 12.0–15.0)
MCH: 22.3 pg — ABNORMAL LOW (ref 26.0–34.0)
MCHC: 29.1 g/dL — ABNORMAL LOW (ref 30.0–36.0)
MCV: 76.7 fL — ABNORMAL LOW (ref 80.0–100.0)
Platelets: 249 10*3/uL (ref 150–400)
RBC: 3 MIL/uL — ABNORMAL LOW (ref 3.87–5.11)
RDW: 19.8 % — ABNORMAL HIGH (ref 11.5–15.5)
WBC: 7.9 10*3/uL (ref 4.0–10.5)
nRBC: 0.4 % — ABNORMAL HIGH (ref 0.0–0.2)

## 2022-12-21 LAB — BASIC METABOLIC PANEL
Anion gap: 9 (ref 5–15)
BUN: 24 mg/dL — ABNORMAL HIGH (ref 8–23)
CO2: 23 mmol/L (ref 22–32)
Calcium: 8.2 mg/dL — ABNORMAL LOW (ref 8.9–10.3)
Chloride: 105 mmol/L (ref 98–111)
Creatinine, Ser: 1.26 mg/dL — ABNORMAL HIGH (ref 0.44–1.00)
GFR, Estimated: 44 mL/min — ABNORMAL LOW (ref >60)
Glucose, Bld: 86 mg/dL (ref 70–99)
Potassium: 4 mmol/L (ref 3.5–5.1)
Sodium: 137 mmol/L (ref 135–145)

## 2022-12-21 LAB — PREPARE RBC (CROSSMATCH)

## 2022-12-21 LAB — HEMOGLOBIN AND HEMATOCRIT, BLOOD
HCT: 23.6 % — ABNORMAL LOW (ref 36.0–46.0)
Hemoglobin: 6.8 g/dL — CL (ref 12.0–15.0)

## 2022-12-21 MED ORDER — SODIUM CHLORIDE 0.9% IV SOLUTION: Freq: Once | INTRAVENOUS | Status: AC

## 2022-12-21 NOTE — Progress Notes (Signed)
[facility] night cross cover note:   I was notified by RN of the patient's hemoglobin level of 6.7 this morning.  Her most recent prior non-iSTAT Hgb occurred on the AM of 10/2, at which time it was found to be 7.0, prompting transfusion of 1 unit prbc. VS appear stable, including most recent blood pressure 110/64, with heart rates in the 80s.  Per my discussions with the patient's RN, no overt evidence of active bleed.  Will repeat hemoglobin level at this time.    Newton Pigg, DO Hospitalist
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
## 2022-12-21 NOTE — Plan of Care (Signed)
Problem: Education: Goal: Knowledge of General Education information will improve Description: Including pain rating scale, medication(s)/side effects and non-pharmacologic comfort measures Outcome: Progressing Pt is confused and is A/O to self only.  She does not understands why she was admitted into the hospital.  Pt was having multiple falls at home.  Per husband decrease Po intake and increase weakness.    Problem: Clinical Measurements: Goal: Will remain free from infection Outcome: Progressing S/Sx of infection monitored and assessed q-shift.  Pt has remained afebrile thus far.     Problem: Clinical Measurements: Goal: Respiratory complications will improve Outcome: Progressing Respiratory status monitored and assessed q-shift.  Pt is on room air with PO2 at 95-100% and respiration rate of 15-17 breaths per minute.  Pt has not endorsed c/o SOB or DOE.    Problem: Clinical Measurements: Goal: Cardiovascular complication will be avoided Outcome: Progressing Pt's DBP was on the low end this shift.  Dayna Barker, MD was notified.  Pt's hemoglobin this am was 6.8.  Dayna Barker, MD then ordered pt to received 1 unit PRBCs.  Pt's DBP still remains on the low end.  Dayna Barker, MD then gave verb order to hold 60mg  of Isosorbide.     Problem: Activity: Goal: Risk for activity intolerance will decrease Outcome: Progressing Pt is dependent of all her ADLs.  She cannot get up OOB.  Per pt's husband she is bed/ wheelchair bound at home.     Problem: Nutrition: Goal: Adequate nutrition will be maintained Outcome: Progressing Pt is on a clear diet per MD's orders and can tolerate it w/o s/sx of abdominal pain/ distention or n/v.    Problem: Safety: Goal: Ability to remain free from injury will improve Outcome: Progressing Pt has remained free from falls thus far.  Instructed pt to utilize RN call light for assistance.  Hourly rounds performed.  Bed alarm implemented to keep pt safe from falls.  Settings  activated to third most sensitive mode.  Bed in lowest position, locked with two upper side rails engaged.  Belongings and call light within reach.    Problem: Skin Integrity: Goal: Risk for impaired skin integrity will decrease Outcome: Progressing Skin integrity monitored and assessed q-shift.  Pt is on q2 hourly turns to prevent further skin impairment.  Tubes and drains assessed for device related pressures sores.  Pt is incontinent of both bowel and bladder.  She is checked q2 hours for bowel incontinence.  Perineal care given promptly after each episode.  Pt has an external female catheter (purewick) in place to capture her urine.  Management of external female catheter (purewick) performed per [facility] policy, procedure and guidelines.

## 2022-12-21 NOTE — Progress Notes (Signed)
PROGRESS NOTE Brandy Paul  MWU:[id number] DOB: 07/02/1945 DOA: 12/20/2022 PCP: Alysia Penna, MD  Brief Narrative/Hospital Course: 77 y.o.f w/ prior cva with residual right sided weakness, copd, anemia, ghtn, dm, breast cancer, who presented with complaint of frequent falls. Husband reports decreased PO as of late, frequent falls. Has bruising to head and knees. Currently complaining of b/l knee pain, says this is chronic. No melena or hematochezia. Husband brought her in because of the frequent falls. Denies new focal neurologic problems. EGD in 2021 showed esophagitis, patient doesn't think she's had endoscopy since.  No vomiting or diarrhea, no chest pain, no fevers or dysuria. Patient admitted given anemia placed on PPI and GI consulted    Subjective: Patient seen and examined this morning husband at the bedside resting comfortably getting her blood transfusion this morning No bowel meant, feels hungry and wants to eat.  Denies nausea vomiting or abdominal pain Overnight-BP in soft 110 not hypoxic afebrile, labs on recheck hemoglobin still low 6.8 g, creatinine 1.2 better from 1.7.   Assessment and Plan: Principal Problem:   Symptomatic anemia Active Problems:   CVA, old, aphasia   T2DM (type 2 diabetes mellitus) (HCC)   Anemia   Essential hypertension   COPD (chronic obstructive pulmonary disease) (HCC)   Malignant neoplasm of central portion of right breast in female, estrogen receptor positive (HCC)   Frequent falls Hx CVA With residual right sided weakness: Likely 2/2 prior stroke, anemia. Ct head/neck no acute finding, nothing acute.  Underwent MRI brain with old bilateral basal ganglia and cerebellar infarcts and microvascular ischemia. PTOT eval once hemoglobin stable. Home Plavix on hold until cleared by GI, continue Zetia and atorvastatin.  Anemia, microcytic acute on chronic Concerning for occult GI bleeding Iron deficiency anemia: Getting second unit PRBC. GI has  been consulted continue PPI twice daily.  IV iron replaced, patient having difficulty with oral iron recently likely the cause of the anemia. Recent Labs    05/18/22 0542 12/20/22 0957 12/20/22 1012 12/21/22 0456 12/21/22 0624  HGB 6.3* 7.0* 8.5* 6.7* 6.8*  MCV 68.0* 79.2*  --  76.7*  --   VITAMINB12  --  1,480*  --   --   --   FERRITIN  --  60  --   --   --     Knee pain chronic bilateral: X-ray obtained in the ED showing degenerative arthritis, no acute fracture or dislocation, continue pain control  AKI  Likely prerenal 2/2 decreased po. Cr 1.59 from baseline of 1.2, improved, discontinue IV fluids Recent Labs  Lab 12/20/22 0957 12/20/22 1012 12/21/22 0456  BUN 27* 26* 24*  CREATININE 1.59* 1.70* 1.26*    Frequent UTI: Continue home trimethoprim  MDD: Continue Wellbutrin and sertraline  Overactive bladder: Continue oxybutynin  COPD: Not in exacerbation continue home tiotropium   Hx breast cancer: S/p definitive surgery, cont home anastrozole   Obesity:Patient's Body mass index is 35.42 kg/m. : Will benefit with PCP follow-up, weight loss  healthy lifestyle  DVT prophylaxis: Place and maintain sequential compression device Start: 12/20/22 1503 Code Status:   Code Status: Full Code Family Communication: plan of care discussed with patient/husband at bedside. Patient status is: Inpatient because of anemia Level of care: Med-Surg   Dispo: The patient is from: home            Anticipated disposition: TBD Objective: Vitals last 24 hrs: Vitals:   12/21/22 0400 12/21/22 0841 12/21/22 1015 12/21/22 1037  BP: 110/64 (!) 110/44 Marland Kitchen)  113/38 (!) 113/48  Pulse: 82 77 78 80  Resp: 15 16 16 16   Temp: 97.6 F (36.4 C) 97.8 F (36.6 C) 98.8 F (37.1 C) 98.6 F (37 C)  TempSrc: Oral Oral Oral Oral  SpO2: 100% 96% 97% 95%  Weight:      Height:       Weight change:   Physical Examination: General exam: alert awake, older than stated age HEENT:Oral mucosa moist,  Ear/Nose WNL grossly Respiratory system: bilaterally clear BS, no use of accessory muscle Cardiovascular system: S1 & S2 +, No JVD. Gastrointestinal system: Abdomen soft,NT,ND, BS+ Nervous System:Alert, awake, moving extremities with residual right-sided weakness. Extremities: LE edema neg,distal peripheral pulses palpable.  Skin: No rashes,no icterus. MSK: Normal muscle bulk,tone, power  Medications reviewed:  Scheduled Meds:  sodium chloride   Intravenous Once   anastrozole  1 mg Oral Daily   atorvastatin  40 mg Oral QHS   buPROPion  150 mg Oral Daily   ezetimibe  10 mg Oral QHS   isosorbide mononitrate  60 mg Oral Daily   oxybutynin  10 mg Oral Daily   pantoprazole (PROTONIX) IV  40 mg Intravenous Q12H   sertraline  100 mg Oral QHS   trimethoprim  100 mg Oral Daily   umeclidinium bromide  1 puff Inhalation QPM   Continuous Infusions:  sodium chloride Stopped (12/21/22 1023)    Diet Order             Diet clear liquid Room service appropriate? Yes; Fluid consistency: Thin  Diet effective now                   Intake/Output Summary (Last 24 hours) at 12/21/2022 1144 Last data filed at 12/21/2022 1023 Gross per 24 hour  Intake 938.88 ml  Output 450 ml  Net 488.88 ml   Net IO Since Admission: 488.88 mL [12/21/22 1144]  Wt Readings from Last 3 Encounters:  12/20/22 90.7 kg  08/01/22 90.7 kg  08/01/22 90.8 kg     Unresulted Labs (From admission, onward)     Start     Ordered   12/20/22 1025  Urinalysis, Routine w reflex microscopic -Urine, Clean Catch  Once,   URGENT       Question:  Specimen Source  Answer:  Urine, Clean Catch   12/20/22 1024          Data Reviewed: I have personally reviewed following labs and imaging studies CBC: Recent Labs  Lab 12/20/22 0957 12/20/22 1012 12/21/22 0456 12/21/22 0624  WBC 10.8*  --  7.9  --   HGB 7.0* 8.5* 6.7* 6.8*  HCT 25.5* 25.0* 23.0* 23.6*  MCV 79.2*  --  76.7*  --   PLT 321  --  249  --    Basic  Metabolic Panel: Recent Labs  Lab 12/20/22 0957 12/20/22 1012 12/21/22 0456  NA 136 136 137  K 4.5 4.4 4.0  CL 100 102 105  CO2 21*  --  23  GLUCOSE 150* 151* 86  BUN 27* 26* 24*  CREATININE 1.59* 1.70* 1.26*  CALCIUM 9.1  --  8.2*   GFR: Estimated Creatinine Clearance: 40 mL/min (A) (by C-G formula based on SCr of 1.26 mg/dL (H)). Coagulation Profile: Recent Labs  Lab 12/20/22 0957  INR 1.1  CBG: Recent Labs  Lab 12/20/22 0950  GLUCAP 136*  No results found for this or any previous visit (from the past 240 hour(s)).  Antimicrobials: Anti-infectives (From admission, onward)  Start     Dose/Rate Route Frequency Ordered Stop   12/21/22 1000  trimethoprim (TRIMPEX) tablet 100 mg        100 mg Oral Daily 12/20/22 1501        Culture/Microbiology    Component Value Date/Time   SDES URINE, RANDOM 09/17/2017 1256   SPECREQUEST NONE 09/17/2017 1256   CULT (A) 09/17/2017 1256    <10,000 COLONIES/mL INSIGNIFICANT GROWTH Performed at [facility], [address] [place], [place] [address]    REPTSTATUS 09/18/2017 FINAL 09/17/2017 1256     Radiology Studies: MR BRAIN WO CONTRAST  Result Date: 12/20/2022 CLINICAL DATA:  Fall EXAM: MRI HEAD WITHOUT CONTRAST TECHNIQUE: Multiplanar, multiecho pulse sequences of the brain and surrounding structures were obtained without intravenous contrast. COMPARISON:  09/18/2017 FINDINGS: Brain: No acute infarct, mass effect or extra-axial collection. No chronic microhemorrhage or siderosis. There is multifocal hyperintense T2-weighted signal within the white matter. Generalized volume loss. Old bilateral basal ganglia and cerebellar infarcts. The midline structures are normal. Vascular: Normal flow voids. Skull and upper cervical spine: Normal marrow signal. Sinuses/Orbits: Negative. Other: None. IMPRESSION: 1. No acute intracranial abnormality. 2. Old bilateral basal ganglia and cerebellar infarcts and findings of chronic  microvascular ischemia. Electronically Signed   By: Deatra Robinson M.D.   On: 12/20/2022 23:25   DG Knee 1-2 Views Left  Result Date: 12/20/2022 CLINICAL DATA:  Left and right knee pain and bruising EXAM: LEFT KNEE - 1-2 VIEW; RIGHT KNEE - 1-2 VIEW COMPARISON:  Left knee radiographs 05/10/2021 FINDINGS: Right: No acute fracture or dislocation. Chondrocalcinosis in the lateral and medial compartments. Degenerative arthritis right knee with moderate medial and lateral compartment narrowing. Small knee joint effusion. Patellar enthesophytes. Soft tissues are unremarkable. Left: No acute fracture or dislocation. Degenerative arthritis left knee with advanced lateral compartment narrowing. Trace knee joint effusion. Superior patellar enthesophyte. Soft tissues are unremarkable. IMPRESSION: 1. No acute fracture or dislocation. 2. Degenerative arthritis of the knees with advanced lateral compartment narrowing on the left and moderate medial and lateral compartment narrowing on the right. Electronically Signed   By: Minerva Fester M.D.   On: 12/20/2022 16:12   DG Knee 1-2 Views Right  Result Date: 12/20/2022 CLINICAL DATA:  Left and right knee pain and bruising EXAM: LEFT KNEE - 1-2 VIEW; RIGHT KNEE - 1-2 VIEW COMPARISON:  Left knee radiographs 05/10/2021 FINDINGS: Right: No acute fracture or dislocation. Chondrocalcinosis in the lateral and medial compartments. Degenerative arthritis right knee with moderate medial and lateral compartment narrowing. Small knee joint effusion. Patellar enthesophytes. Soft tissues are unremarkable. Left: No acute fracture or dislocation. Degenerative arthritis left knee with advanced lateral compartment narrowing. Trace knee joint effusion. Superior patellar enthesophyte. Soft tissues are unremarkable. IMPRESSION: 1. No acute fracture or dislocation. 2. Degenerative arthritis of the knees with advanced lateral compartment narrowing on the left and moderate medial and lateral  compartment narrowing on the right. Electronically Signed   By: Minerva Fester M.D.   On: 12/20/2022 16:12   CT Head Wo Contrast  Result Date: 12/20/2022 CLINICAL DATA:  Fall with trauma to the head, face and neck. EXAM: CT HEAD WITHOUT CONTRAST CT CERVICAL SPINE WITHOUT CONTRAST TECHNIQUE: Multidetector CT imaging of the head and cervical spine was performed following the standard protocol without intravenous contrast. Multiplanar CT image reconstructions of the cervical spine were also generated. RADIATION DOSE REDUCTION: This exam was performed according to the departmental dose-optimization program which includes automated exposure control, adjustment of the mA  and/or kV according to patient size and/or use of iterative reconstruction technique. COMPARISON:  05/10/2021 FINDINGS: CT HEAD FINDINGS Brain: No acute finding. Old small vessel ischemic changes affect the pons. Numerous old small vessel cerebellar infarctions. Extensive old infarctions in the basal ganglia and radiating white matter tracts, more extensive on the left than the right. Old left frontal cortical and subcortical infarction. No sign of acute infarction, mass lesion, hemorrhage, hydrocephalus or extra-axial collection. Vascular: There is atherosclerotic calcification of the major vessels at the base of the brain. Skull: Negative Sinuses/Orbits: Clear/normal Other: None CT CERVICAL SPINE FINDINGS Alignment: Straightening of the normal cervical lordosis. No traumatic malalignment. Skull base and vertebrae: No fracture or focal bone lesion. Soft tissues and spinal canal: Negative Disc levels: The foramen magnum is widely patent. There is ordinary mild osteoarthritis of the C1-2 articulation but no encroachment upon the neural structures. C2-3: Normal interspace. C3-4 through C6-7: Spondylosis with disc space narrowing and endplate osteophytes. Mild bilateral bony foraminal stenosis. C7-T1: Normal interspace. Upper chest: Negative Other: None  IMPRESSION: HEAD CT: No acute or traumatic finding. Extensive old ischemic changes as outlined above. CERVICAL SPINE CT: No acute or traumatic finding. Degenerative spondylosis C3-4 through C6-7. Electronically Signed   By: Paulina Fusi M.D.   On: 12/20/2022 12:38   CT Cervical Spine Wo Contrast  Result Date: 12/20/2022 CLINICAL DATA:  Fall with trauma to the head, face and neck. EXAM: CT HEAD WITHOUT CONTRAST CT CERVICAL SPINE WITHOUT CONTRAST TECHNIQUE: Multidetector CT imaging of the head and cervical spine was performed following the standard protocol without intravenous contrast. Multiplanar CT image reconstructions of the cervical spine were also generated. RADIATION DOSE REDUCTION: This exam was performed according to the departmental dose-optimization program which includes automated exposure control, adjustment of the mA and/or kV according to patient size and/or use of iterative reconstruction technique. COMPARISON:  05/10/2021 FINDINGS: CT HEAD FINDINGS Brain: No acute finding. Old small vessel ischemic changes affect the pons. Numerous old small vessel cerebellar infarctions. Extensive old infarctions in the basal ganglia and radiating white matter tracts, more extensive on the left than the right. Old left frontal cortical and subcortical infarction. No sign of acute infarction, mass lesion, hemorrhage, hydrocephalus or extra-axial collection. Vascular: There is atherosclerotic calcification of the major vessels at the base of the brain. Skull: Negative Sinuses/Orbits: Clear/normal Other: None CT CERVICAL SPINE FINDINGS Alignment: Straightening of the normal cervical lordosis. No traumatic malalignment. Skull base and vertebrae: No fracture or focal bone lesion. Soft tissues and spinal canal: Negative Disc levels: The foramen magnum is widely patent. There is ordinary mild osteoarthritis of the C1-2 articulation but no encroachment upon the neural structures. C2-3: Normal interspace. C3-4 through  C6-7: Spondylosis with disc space narrowing and endplate osteophytes. Mild bilateral bony foraminal stenosis. C7-T1: Normal interspace. Upper chest: Negative Other: None IMPRESSION: HEAD CT: No acute or traumatic finding. Extensive old ischemic changes as outlined above. CERVICAL SPINE CT: No acute or traumatic finding. Degenerative spondylosis C3-4 through C6-7. Electronically Signed   By: Paulina Fusi M.D.   On: 12/20/2022 12:38     LOS: 1 day   Lanae Boast, MD [facility]  12/21/2022, 11:44 AM

## 2022-12-21 NOTE — Plan of Care (Signed)

## 2022-12-21 NOTE — Hospital Course (Addendum)
76 y.o.f w/ prior cva with residual right sided weakness, copd, anemia, ghtn, dm, breast cancer, who presented with complaint of frequent falls. Husband reports decreased PO as of late, frequent falls. Has bruising to head and knees. Currently complaining of b/l knee pain, says this is chronic. No melena or hematochezia. Husband brought her in because of the frequent falls. Denies new focal neurologic problems. EGD in 2021 showed esophagitis, patient doesn't think she's had endoscopy since.  No vomiting or diarrhea, no chest pain, no fevers or dysuria. 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  Her diet is being advanced.  At this time patient remains hemodynamically stable tolerating diet hemoglobin is stable and 78 g range.

## 2022-12-21 NOTE — Evaluation (Signed)
Occupational Therapy Evaluation Patient Details Name: Brandy Paul MRN: [id number] DOB: [birth year] Today's Date: 12/21/2022   History of Present Illness Pt is 77 yo presenting to [facility] due to frequent falls. 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   Clinical Impression   PTA pt lives at home with husband who assists with all ADL and mobility. Husband states Cedrina is usually able to walk a few steps using her QC and assist with ADL tasks. Husband states Belem has had a increase in her number of falls, has required more assistance with self care and her PO intake has decreased significantly over the last couple of weeks. At this time recommend inpatient follow up therapy, <3 hours/day. Pt will benefit form a R revalon boot to reduce risk of pressure on R heel/lateral ankle as well as a palm guard to improve skin integrity R hand. Acute OT to follow.       If plan is discharge home, recommend the following: Two people to help with walking and/or transfers;Two people to help with bathing/dressing/bathroom    Functional Status Assessment  Patient has had a recent decline in their functional status and demonstrates the ability to make significant improvements in function in a reasonable and predictable amount of time.  Equipment Recommendations  None recommended by OT    Recommendations for Other Services  Palliative care consult     Precautions / Restrictions Precautions Precautions: Fall Precaution Comments: previous R sided weakness from CVA; contracture RUE, especially R hand Restrictions Weight Bearing Restrictions: No      Mobility Bed Mobility Overal bed mobility: Needs Assistance Bed Mobility: Rolling Rolling: Max assist, +2 for physical assistance              Transfers                   General transfer comment: not attempted this session; will need +2 Max assist/DLE      Balance Overall balance assessment: History of  Falls                                         ADL either performed or assessed with clinical judgement   ADL Overall ADL's : Needs assistance/impaired Eating/Feeding: Set up;Supervision/ safety   Grooming: Minimal assistance   Upper Body Bathing: Minimal assistance;Bed level   Lower Body Bathing: Maximal assistance;Bed level   Upper Body Dressing : Maximal assistance;Bed level   Lower Body Dressing: Total assistance       Toileting- Clothing Manipulation and Hygiene: Total assistance Toileting - Clothing Manipulation Details (indicate cue type and reason): uses purewick at home     Functional mobility during ADLs:  (deferred)       Vision Baseline Vision/History: 1 Wears glasses Additional Comments: eyes closed majority of session; husband states she keeps her L eye closed adn rubs it and cuases her eyelases to grow in toward her eye     Perception         Praxis         Pertinent Vitals/Pain Pain Assessment Pain Assessment: Faces Faces Pain Scale: Hurts little more Pain Location: stomach Pain Descriptors / Indicators: Aching     Extremity/Trunk Assessment Upper Extremity Assessment Upper Extremity Assessment: RUE deficits/detail;LUE deficits/detail RUE Deficits / Details: flexor contracted; R hand fisted with mal odor; able to raise R shoulder to complete care  under arm however pt c/o pain; non-functional RUE Coordination: decreased fine motor;decreased gross motor LUE Deficits / Details: generalized weakness   Lower Extremity Assessment Lower Extremity Assessment: Generalized weakness   Cervical / Trunk Assessment Cervical / Trunk Assessment: Normal (increased body habitus)   Communication Communication Communication: Other (comment) (history of expressive aphasia.) Cueing Techniques: Verbal cues;Tactile cues;Visual cues   Cognition Arousal: Lethargic Behavior During Therapy: Flat affect Overall Cognitive Status: Impaired/Different  from baseline Area of Impairment: Orientation, Following commands, Attention, Memory, Safety/judgement, Awareness, Problem solving                 Orientation Level: Disoriented to, Time Current Attention Level: Selective Memory: Decreased short-term memory Following Commands: Follows one step commands consistently Safety/Judgement: Decreased awareness of safety, Decreased awareness of deficits Awareness: Emergent Problem Solving: Slow processing, Decreased initiation, Difficulty sequencing       General Comments  Pt with multiple bruises all over her body. Husband reports they are from falls    Exercises     Shoulder Instructions      Home Living Family/patient expects to be discharged to:: Private residence Living Arrangements: Spouse/significant other Available Help at Discharge: Family;Available 24 hours/day Type of Home: House Home Access: Ramped entrance     Home Layout: One level     Bathroom Shower/Tub: Producer, television/film/video: Standard     Home Equipment: Wheelchair - Engineer, technical sales - power;Cane - quad;BSC/3in1;Other (comment);Lift chair;Tub bench (adjustable bed; "lift assist")          Prior Functioning/Environment Prior Level of Function : Needs assist       Physical Assist : Mobility (physical) Mobility (physical): Bed mobility;Transfers;Gait   Mobility Comments: Pt spouse states that he physically assists her. Reports that she was ambulating short distances up to about 2-3 weeks ago. ADLs Comments: Pt spouse assists with all ADL"s including dressing, bathing and toileting. Pt can assist less than 25%.        OT Problem List: Decreased strength;Decreased range of motion;Decreased activity tolerance;Impaired balance (sitting and/or standing);Impaired vision/perception;Decreased coordination;Decreased cognition;Decreased safety awareness;Decreased knowledge of use of DME or AE;Impaired sensation;Impaired tone;Obesity;Impaired UE  functional use;Pain      OT Treatment/Interventions: Self-care/ADL training;Therapeutic exercise;DME and/or AE instruction;Therapeutic activities;Cognitive remediation/compensation;Visual/perceptual remediation/compensation;Balance training;Patient/family education    OT Goals(Current goals can be found in the care plan section) Acute Rehab OT Goals Patient Stated Goal: per husband to get some rehab OT Goal Formulation: With patient/family Time For Goal Achievement: 01/04/23 Potential to Achieve Goals: Fair  OT Frequency: Min 1X/week    Co-evaluation PT/OT/SLP Co-Evaluation/Treatment: Yes (partial session) Reason for Co-Treatment: For patient/therapist safety   OT goals addressed during session: ADL's and self-care      AM-PAC OT "6 Clicks" Daily Activity     Outcome Measure Help from another person eating meals?: A Little Help from another person taking care of personal grooming?: A Little Help from another person toileting, which includes using toliet, bedpan, or urinal?: Total Help from another person bathing (including washing, rinsing, drying)?: A Lot Help from another person to put on and taking off regular upper body clothing?: A Lot Help from another person to put on and taking off regular lower body clothing?: Total 6 Click Score: 12   End of Session Nurse Communication: Mobility status;Need for lift equipment  Activity Tolerance: Patient tolerated treatment well Patient left: in bed;with call bell/phone within reach;with bed alarm set;with family/visitor present  OT Visit Diagnosis: Other abnormalities of gait and mobility (R26.89);Repeated falls (R29.6);Muscle weakness (  generalized) (M62.81);Hemiplegia and hemiparesis Hemiplegia - Right/Left: Right Hemiplegia - dominant/non-dominant: Dominant 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

## 2022-12-22 DIAGNOSIS — D649 Anemia, unspecified: Secondary | ICD-10-CM | POA: Diagnosis not present

## 2022-12-22 LAB — BASIC METABOLIC PANEL
Anion gap: 8 (ref 5–15)
BUN: 16 mg/dL (ref 8–23)
CO2: 23 mmol/L (ref 22–32)
Calcium: 7.2 mg/dL — ABNORMAL LOW (ref 8.9–10.3)
Chloride: 106 mmol/L (ref 98–111)
Creatinine, Ser: 1.14 mg/dL — ABNORMAL HIGH (ref 0.44–1.00)
GFR, Estimated: 50 mL/min — ABNORMAL LOW (ref >60)
Glucose, Bld: 94 mg/dL (ref 70–99)
Potassium: 3.6 mmol/L (ref 3.5–5.1)
Sodium: 137 mmol/L (ref 135–145)

## 2022-12-22 LAB — HEMOGLOBIN AND HEMATOCRIT, BLOOD
HCT: 25.9 % — ABNORMAL LOW (ref 36.0–46.0)
Hemoglobin: 7.6 g/dL — ABNORMAL LOW (ref 12.0–15.0)

## 2022-12-22 LAB — CBC
HCT: 25.2 % — ABNORMAL LOW (ref 36.0–46.0)
Hemoglobin: 7.5 g/dL — ABNORMAL LOW (ref 12.0–15.0)
MCH: 23.6 pg — ABNORMAL LOW (ref 26.0–34.0)
MCHC: 29.8 g/dL — ABNORMAL LOW (ref 30.0–36.0)
MCV: 79.2 fL — ABNORMAL LOW (ref 80.0–100.0)
Platelets: 237 10*3/uL (ref 150–400)
RBC: 3.18 MIL/uL — ABNORMAL LOW (ref 3.87–5.11)
RDW: 19.5 % — ABNORMAL HIGH (ref 11.5–15.5)
WBC: 6.4 10*3/uL (ref 4.0–10.5)
nRBC: 0.5 % — ABNORMAL HIGH (ref 0.0–0.2)

## 2022-12-22 MED ORDER — FERROUS SULFATE 325 (65 FE) MG PO TABS
325.0000 mg | ORAL_TABLET | ORAL | Status: DC
  Administered 2022-12-22 – 2022-12-27 (×3): 325 mg via ORAL
  Filled 2022-12-22 (×4): qty 1

## 2022-12-22 NOTE — Plan of Care (Signed)
roblem: Education: Goal: Knowledge of General Education information will improve Description: Including pain rating scale, medication(s)/side effects and non-pharmacologic comfort measures Outcome: Progressing Pt is confused and is A/O to self only.  She does not understands why she was admitted into the hospital.  Pt was having multiple falls at home.  Per husband decrease Po intake and increase weakness.    Problem: Clinical Measurements: Goal: Will remain free from infection Outcome: Progressing S/Sx of infection monitored and assessed q-shift.  Pt has remained afebrile thus far.     Problem: Clinical Measurements: Goal: Respiratory complications will improve Outcome: Progressing Respiratory status monitored and assessed q-shift.  Pt is on room air with PO2 at 95-100% and respiration rate of 15-17 breaths per minute.  Pt has not endorsed c/o SOB or DOE.    Problem: Clinical Measurements: Goal: Cardiovascular complication will be avoided Outcome: Progressing Pt's DBP was on the low end this shift.  Dayna Barker, MD was notified.  Pt's hemoglobin this am was 6.8.  Dayna Barker, MD then ordered pt to received 1 unit PRBCs.  Pt's DBP still remains on the low end.  Dayna Barker, MD then gave verb order to hold 60mg  of Isosorbide.     Problem: Activity: Goal: Risk for activity intolerance will decrease Outcome: Progressing Pt is dependent of all her ADLs.  She cannot get up OOB.  Per pt's husband she is bed/ wheelchair bound at home.     Problem: Nutrition: Goal: Adequate nutrition will be maintained Outcome: Progressing Pt is on a clear diet per MD's orders and can tolerate it w/o s/sx of abdominal pain/ distention or n/v.    Problem: Safety: Goal: Ability to remain free from injury will improve Outcome: Progressing Pt has remained free from falls thus far.  Instructed pt to utilize RN call light for assistance.  Hourly rounds performed.  Bed alarm implemented to keep pt safe from falls.  Settings  activated to third most sensitive mode.  Bed in lowest position, locked with two upper side rails engaged.  Belongings and call light within reach.    Problem: Skin Integrity: Goal: Risk for impaired skin integrity will decrease Outcome: Progressing Skin integrity monitored and assessed q-shift.  Pt is on q2 hourly turns to prevent further skin impairment.  Tubes and drains assessed for device related pressures sores.  Pt is incontinent of both bowel and bladder.  She is checked q2 hours for bowel incontinence.  Perineal care given promptly after each episode.  Pt has an external female catheter (purewick) in place to capture her urine.  Management of external female catheter (purewick) performed per [facility] policy, procedure and guidelines.

## 2022-12-22 NOTE — Progress Notes (Signed)
Occupational Therapy Treatment Patient Details Name: MELENDA WARNING MRN: [id number] DOB: January 04, 1946 Today's Date: 12/22/2022   History of present illness Pt is 77 yo presenting to [facility] due to frequent falls. 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   OT comments  R  palm cleaned then R palm guard with tubing insert positioned in hand to keep fingers out of hand and reduce MASD/improve hand hygiene. Nsg asked to remove palm guard 1x/shift for hand hygiene. RUE need to be supoprted on pillows to reduce dependent edema.         If plan is discharge home, recommend the following:  Two people to help with walking and/or transfers;Two people to help with bathing/dressing/bathroom   Equipment Recommendations  None recommended by OT    Recommendations for Other Services      Precautions / Restrictions Precautions Precautions: Fall Precaution Comments: previous R sided weakness from CVA; contracture RUE, especially R hand                                                   ADL either performed or assessed with clinical judgement   ADL                                              Extremity/Trunk Assessment              Vision       Perception     Praxis      Cognition Arousal: Alert Behavior During Therapy: Flat affect                                   General Comments: more alert adn interactive; most likely returning closer to baseline        Exercises      Shoulder Instructions       General Comments R palm guard placed in hand. Pt states it feel "good"    Pertinent Vitals/ Pain       Pain Assessment Pain Assessment: Faces Faces Pain Scale: Hurts little more Pain Location: wihtR han dROM Pain Descriptors / Indicators: Discomfort Pain Intervention(s): Limited activity within patient's tolerance  Home Living                                           Prior Functioning/Environment              Frequency  Min 1X/week        Progress Toward Goals  OT Goals(current goals can now be found in the care plan section)  Progress towards OT goals: Progressing toward goals  Acute Rehab OT Goals Patient Stated Goal: to eat dinner OT Goal Formulation: With patient/family Time For Goal Achievement: 01/04/23 Potential to Achieve Goals: Fair ADL Goals Pt Will Perform Upper Body Bathing: with min assist;sitting;bed level Additional ADL Goal #1: tolerate R palm guard to reduce MASD adn improve skin integrity Additional ADL Goal #2: Bed mobility with mod A in preparation for ADL tasks  Plan  Co-evaluation                 AM-PAC OT "6 Clicks" Daily Activity     Outcome Measure   Help from another person eating meals?: A Little Help from another person taking care of personal grooming?: A Little Help from another person toileting, which includes using toliet, bedpan, or urinal?: Total Help from another person bathing (including washing, rinsing, drying)?: A Lot Help from another person to put on and taking off regular upper body clothing?: A Lot Help from another person to put on and taking off regular lower body clothing?: Total 6 Click Score: 12    End of Session    OT Visit Diagnosis: Other abnormalities of gait and mobility (R26.89);Repeated falls (R29.6);Muscle weakness (generalized) (M62.81);Hemiplegia and hemiparesis Hemiplegia - Right/Left: Right Hemiplegia - dominant/non-dominant: Dominant Hemiplegia - caused by: Cerebral infarction (previous)   Activity Tolerance Patient tolerated treatment well   Patient Left in bed;with call bell/phone within reach;with bed alarm set   Nurse Communication Other (comment) (use of palm guard)        Time: 4098-1191 OT Time Calculation (min): 15 min  Charges: OT General Charges $OT Visit: 1 Visit OT Treatments $Therapeutic Activity: 8-22 mins  Luisa Dago, OT/L    Acute OT Clinical Specialist Acute Rehabilitation Services Pager [phone] Office [phone]   [facility] 12/22/2022, 4:44 PM

## 2022-12-22 NOTE — [place] FL2 (Signed)
[place] MEDICAID FL2 LEVEL OF CARE FORM     IDENTIFICATION  Patient Name: Brandy Paul Birthdate: [birth year] Sex: female Admission Date (Current Location): 12/20/2022  [facility] and [place] Number:  Producer, television/film/video and Address:  [facility], [address], [place], [place] [address]      Provider Number: [id number]  Attending Physician Name and Address:  Lanae Boast, MD  Relative Name and Phone Number:  Miliah, Weiler (Spouse)  [phone]    Current Level of Care: Hospital Recommended Level of Care: Skilled Nursing Facility Prior Approval Number:    Date Approved/Denied:   PASRR Number: [id number]  Discharge Plan: SNF    Current Diagnoses: Patient Active Problem List   Diagnosis Date Noted   Symptomatic anemia 12/20/2022   Genetic testing 05/02/2022   Malignant neoplasm of central portion of right breast in female, estrogen receptor positive (HCC) 04/27/2022   GIB (gastrointestinal bleeding) 11/08/2019   COPD (chronic obstructive pulmonary disease) (HCC)    Depression    GI bleed 11/07/2019   S/P laparoscopic cholecystectomy 12/10/2017   Transient speech disturbance    Stenosis of both middle cerebral arteries    History of stroke    Essential hypertension    TIA (transient ischemic attack) 09/17/2017   Acute cholecystitis 07/12/2017   Appendicitis 05/10/2017   DOE (dyspnea on exertion) 04/22/2017   Anemia 12/02/2015   Ovarian mass, left 11/04/2015   Pelvic mass in female 11/04/2015   Iron deficiency anemia 04/28/2015   Nonspecific abnormal finding in stool contents 04/06/2015   Anemia, iron deficiency 04/06/2015   Encounter for long-term (current) use of antiplatelets/antithrombotics 04/06/2015   Hyperlipidemia 11/24/2014   Hemiparesis affecting right side as late effect of cerebrovascular accident (HCC) 11/24/2014   Coronary artery spasm (HCC) 10/09/2013   CVA, old, aphasia 10/09/2013   T2DM (type 2 diabetes  mellitus) (HCC) 10/09/2013   Gastroesophageal reflux disease without esophagitis 10/09/2013   Centrilobular emphysema (HCC) 10/09/2013    Orientation RESPIRATION BLADDER Height & Weight     Self  Normal Incontinent Weight: 199 lb 15.3 oz (90.7 kg) Height:  5\' 3"  (160 cm)  BEHAVIORAL SYMPTOMS/MOOD NEUROLOGICAL BOWEL NUTRITION STATUS      Incontinent Diet (see discharge summary)  AMBULATORY STATUS COMMUNICATION OF NEEDS Skin   Extensive Assist Verbally (difficulty speaking)                         Personal Care Assistance Level of Assistance  Bathing, Feeding, Dressing Bathing Assistance: Maximum assistance Feeding assistance: Limited assistance Dressing Assistance: Maximum assistance     Functional Limitations Info  Sight, Hearing, Speech Sight Info: Impaired (wears glasses) Hearing Info: Impaired Speech Info: Impaired (difficulty speaking)    SPECIAL CARE FACTORS FREQUENCY  PT (By licensed PT), OT (By licensed OT)     PT Frequency: 5x/week OT Frequency: 5x/wwek            Contractures Contractures Info: Not present    Additional Factors Info  Code Status, Allergies Code Status Info: FULL Allergies Info: Ceclor (Cefaclor)           Current Medications (12/22/2022):  This is the current hospital active medication list Current Facility-Administered Medications  Medication Dose Route Frequency Provider Last Rate Last Admin   0.9 %  sodium chloride infusion (Manually program via Guardrails IV Fluids)   Intravenous Once Wouk, Wilfred Curtis, MD       anastrozole (ARIMIDEX) tablet 1 mg  1  mg Oral Daily Kathrynn Running, MD   1 mg at 12/22/22 0865   atorvastatin (LIPITOR) tablet 40 mg  40 mg Oral QHS Kathrynn Running, MD   40 mg at 12/21/22 2058   buPROPion [facility] SR) 12 hr tablet 150 mg  150 mg Oral Daily Kathrynn Running, MD   150 mg at 12/22/22 7846   ezetimibe (ZETIA) tablet 10 mg  10 mg Oral QHS Kathrynn Running, MD   10 mg at 12/21/22 2059    ferrous sulfate tablet 325 mg  325 mg Oral Once per day on Monday Wednesday Friday Lanae Boast, MD   325 mg at 12/22/22 1058   isosorbide mononitrate (IMDUR) 24 hr tablet 60 mg  60 mg Oral Daily Kathrynn Running, MD   60 mg at 12/22/22 0835   oxybutynin (DITROPAN-XL) 24 hr tablet 10 mg  10 mg Oral Daily Kathrynn Running, MD   10 mg at 12/22/22 0835   pantoprazole (PROTONIX) injection 40 mg  40 mg Intravenous Q12H Kathrynn Running, MD   40 mg at 12/22/22 0834   polyethylene glycol (MIRALAX / GLYCOLAX) packet 17 g  17 g Oral Daily PRN Meredith Pel, NP       sertraline (ZOLOFT) tablet 100 mg  100 mg Oral QHS Kathrynn Running, MD   100 mg at 12/21/22 2059   trimethoprim (TRIMPEX) tablet 100 mg  100 mg Oral Daily Kathrynn Running, MD   100 mg at 12/22/22 0835   umeclidinium bromide (INCRUSE ELLIPTA) 62.5 MCG/ACT 1 puff  1 puff Inhalation QPM Wouk, Wilfred Curtis, MD   1 puff at 12/21/22 2018     Discharge Medications: Please see discharge summary for a list of discharge medications.  Relevant Imaging Results:  Relevant Lab Results:   Additional Information [id number]  Brandy Paul, [place]

## 2022-12-22 NOTE — TOC Initial Note (Addendum)
Transition of Care [facility]) - Initial/Assessment Note    Patient Details  Name: Brandy Paul MRN: [id number] Date of Birth: [birth year]  [facility] CM/SW Contact:    Lashone Stauber A [place], Theresia Majors Phone Number: 12/22/2022, 12:36 PM  Clinical Narrative:                  CSW met with pt and pt's spouse, "Merlyn Albert" at bedside. She stated that she was ok with referral for SNF. Merlyn Albert stated that they had completed this process before about 11 years ago and did not have any follow up questions. SNF work up completed.   TOC will continue to follow.   Expected Discharge Plan: Skilled Nursing Facility Barriers to Discharge: SNF Pending bed offer   Patient Goals and CMS Choice            Expected Discharge Plan and Services       Living arrangements for the past 2 months: Single Family Home                                      Prior Living Arrangements/Services Living arrangements for the past 2 months: Single Family Home Lives with:: Spouse          Need for Family Participation in Patient Care: Yes (Comment) Care giver support system in place?: Yes (comment)      Activities of Daily Living   ADL Screening (condition at time of admission) Independently performs ADLs?: No Does the patient have a NEW difficulty with bathing/dressing/toileting/self-feeding that is expected to last >3 days?: Yes (Initiates electronic notice to provider for possible OT consult) Does the patient have a NEW difficulty with getting in/out of bed, walking, or climbing stairs that is expected to last >3 days?: Yes (Initiates electronic notice to provider for possible PT consult) Does the patient have a NEW difficulty with communication that is expected to last >3 days?: Yes (Initiates electronic notice to provider for possible SLP consult) Is the patient deaf or have difficulty hearing?: Yes Does the patient have difficulty seeing, even when wearing glasses/contacts?: No Does the patient  have difficulty concentrating, remembering, or making decisions?: No  Permission Sought/Granted                  Emotional Assessment   Attitude/Demeanor/Rapport: Lethargic Affect (typically observed): Quiet Orientation: : Oriented to Self, Oriented to Place Alcohol / Substance Use: Not Applicable Psych Involvement: No (comment)  Admission diagnosis:  Fall, initial encounter [W19.XXXA] Symptomatic anemia [D64.9] Patient Active Problem List   Diagnosis Date Noted   Symptomatic anemia 12/20/2022   Genetic testing 05/02/2022   Malignant neoplasm of central portion of right breast in female, estrogen receptor positive (HCC) 04/27/2022   GIB (gastrointestinal bleeding) 11/08/2019   COPD (chronic obstructive pulmonary disease) (HCC)    Depression    GI bleed 11/07/2019   S/P laparoscopic cholecystectomy 12/10/2017   Transient speech disturbance    Stenosis of both middle cerebral arteries    History of stroke    Essential hypertension    TIA (transient ischemic attack) 09/17/2017   Acute cholecystitis 07/12/2017   Appendicitis 05/10/2017   DOE (dyspnea on exertion) 04/22/2017   Anemia 12/02/2015   Ovarian mass, left 11/04/2015   Pelvic mass in female 11/04/2015   Iron deficiency anemia 04/28/2015   Nonspecific abnormal finding in stool contents 04/06/2015   Anemia, iron deficiency 04/06/2015  Encounter for long-term (current) use of antiplatelets/antithrombotics 04/06/2015   Hyperlipidemia 11/24/2014   Hemiparesis affecting right side as late effect of cerebrovascular accident (HCC) 11/24/2014   Coronary artery spasm (HCC) 10/09/2013   CVA, old, aphasia 10/09/2013   T2DM (type 2 diabetes mellitus) (HCC) 10/09/2013   Gastroesophageal reflux disease without esophagitis 10/09/2013   Centrilobular emphysema (HCC) 10/09/2013   PCP:  Alysia Penna, MD Pharmacy:   [facility] [phone] [place], [place] - [address]  [place] [address] Phone: [phone] Fax: [phone]     Social Determinants of Health (SDOH) Social History: SDOH Screenings   Food Insecurity: No Food Insecurity (12/20/2022)  Housing: Low Risk  (12/20/2022)  Transportation Needs: No Transportation Needs (12/20/2022)  Utilities: Not At Risk (12/20/2022)  Tobacco Use: Medium Risk (12/20/2022)   SDOH Interventions:     Readmission Risk Interventions     No data to display

## 2022-12-22 NOTE — Progress Notes (Signed)
PROGRESS NOTE Brandy Paul  ZOX:[id number] DOB: 1945-06-01 DOA: 12/20/2022 PCP: Alysia Penna, MD  Brief Narrative/Hospital Course: 77 y.o.f w/ prior cva with residual right sided weakness, copd, anemia, ghtn, dm, breast cancer, who presented with complaint of frequent falls. Husband reports decreased PO as of late, frequent falls. Has bruising to head and knees. Currently complaining of b/l knee pain, says this is chronic. No melena or hematochezia. Husband brought her in because of the frequent falls. Denies new focal neurologic problems. EGD in 2021 showed esophagitis, patient doesn't think she's had endoscopy since.  No vomiting or diarrhea, no chest pain, no fevers or dysuria. 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  Her diet is being advanced.      Subjective:  Patient seen examined this morning  Alert awake husband at the bedside hemoglobin stable uptrending 7.6 g  Creatinine improved    Assessment and Plan: Principal Problem:   Symptomatic anemia Active Problems:   CVA, old, aphasia   T2DM (type 2 diabetes mellitus) (HCC)   Anemia   Essential hypertension   COPD (chronic obstructive pulmonary disease) (HCC)   Malignant neoplasm of central portion of right breast in female, estrogen receptor positive (HCC)   Frequent falls Hx CVA With residual right sided weakness: Likely 2/2 prior stroke, anemia. Ct head/neck no acute finding, nothing acute.  Underwent MRI brain with old bilateral basal ganglia and cerebellar infarcts and microvascular ischemia. PTOT eval appreciated recommending skilled nursing facility.Home Plavix on hold > hopefully can resume continue Zetia and atorvastatin.  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  Her diet is being advanced, hemoglobin remains overall stable limits milligram.  Recent Labs    12/20/22 0957 12/20/22 1012 12/21/22 0456 12/21/22 0624 12/22/22 0619 12/22/22 1028  HGB 7.0* 8.5* 6.7* 6.8* 7.5* 7.6*  MCV 79.2*  --  76.7*  --  79.2*  --   VITAMINB12 1,480*  --   --   --   --   --   FERRITIN 60  --   --   --   --   --     Knee pain chronic bilateral: X-ray obtained in the ED showing degenerative arthritis, no acute fracture or dislocation, continue pain control  AKI  Likely prerenal 2/2 decreased po. Cr 1.59 from baseline of 1.2, improved, discontinued IV fluids Recent Labs  Lab 12/20/22 0957 12/20/22 1012 12/21/22 0456 12/22/22 0619  BUN 27* 26* 24* 16  CREATININE 1.59* 1.70* 1.26* 1.14*    Frequent UTI: Continue home trimethoprim  MDD: Continue Wellbutrin and sertraline  Overactive bladder: Continue oxybutynin  COPD: Not in exacerbation continue home tiotropium   Hx breast cancer: S/p definitive surgery, cont home anastrozole   Obesity:Patient's Body mass index is 35.42 kg/m. : Will benefit with PCP follow-up, weight loss  healthy lifestyle  DVT prophylaxis: Place and maintain sequential compression device Start: 12/20/22 1503 Code Status:   Code Status: Full Code Family Communication: plan of care  discussed with patient/husband at bedside. Patient status is: Inpatient because of anemia Level of care: Med-Surg   Dispo: The patient is from: home            Anticipated disposition: sng once bed available.   Objective: Vitals last 24 hrs: Vitals:   12/21/22 1952 12/21/22 2019 12/22/22 0500 12/22/22  0727  BP: (!) 112/46  (!) 123/50 (!) 117/46  Pulse: 82 72 84 72  Resp: 19 16 18 16   Temp: 98.2 F (36.8 C)  98.4 F (36.9 C) 98.4 F (36.9 C)  TempSrc: Oral  Oral Oral  SpO2: 98% 94% 97% 97%  Weight:      Height:       Weight change:   Physical Examination: General exam: alert awake, oriented HEENT:Oral mucosa moist, Ear/Nose WNL grossly Respiratory system: Bilaterally clear BS,no use of accessory muscle Cardiovascular system: S1 & S2 +, No JVD. Gastrointestinal system: Abdomen soft,NT,ND, BS+ Nervous System: Alert, awake, right-sided weakness at baseline  Extremities: LE edema neg,distal peripheral pulses palpable and warm.  Skin: No rashes,no icterus. MSK: Normal muscle bulk,tone, power   Medications reviewed:  Scheduled Meds:  sodium chloride   Intravenous Once   anastrozole  1 mg Oral Daily   atorvastatin  40 mg Oral QHS   buPROPion  150 mg Oral Daily   ezetimibe  10 mg Oral QHS   ferrous sulfate  325 mg Oral Once per day on Monday Wednesday Friday   isosorbide mononitrate  60 mg Oral Daily   oxybutynin  10 mg Oral Daily   pantoprazole (PROTONIX) IV  40 mg Intravenous Q12H   sertraline  100 mg Oral QHS   trimethoprim  100 mg Oral Daily   umeclidinium bromide  1 puff Inhalation QPM   Continuous Infusions:    Diet Order             DIET SOFT Room service appropriate? Yes; Fluid consistency: Thin  Diet effective now                   Intake/Output Summary (Last 24 hours) at 12/22/2022 1307 Last data filed at 12/22/2022 0835 Gross per 24 hour  Intake 845 ml  Output 400 ml  Net 445 ml   Net IO Since Admission: 933.88 mL [12/22/22 1307]  Wt Readings from Last 3 Encounters:  12/20/22 90.7 kg  08/01/22 90.7 kg  08/01/22 90.8 kg     Unresulted Labs (From admission, onward)     Start     Ordered   12/22/22 0500  Basic metabolic panel  Daily,   R      12/21/22 1150   12/22/22 0500  CBC  Daily,   R      12/21/22 1150   12/20/22 1025  Urinalysis,  Routine w reflex microscopic -Urine, Clean Catch  Once,   URGENT       Question:  Specimen Source  Answer:  Urine, Clean Catch   12/20/22 1024          Data Reviewed: I have personally reviewed following labs and imaging studies CBC: Recent Labs  Lab 12/20/22 0957 12/20/22 1012 12/21/22 0456 12/21/22 0624 12/22/22 0619 12/22/22 1028  WBC 10.8*  --  7.9  --  6.4  --   HGB 7.0* 8.5* 6.7* 6.8* 7.5* 7.6*  HCT 25.5* 25.0* 23.0* 23.6* 25.2* 25.9*  MCV 79.2*  --  76.7*  --  79.2*  --   PLT 321  --  249  --  237  --    Basic Metabolic Panel: Recent Labs  Lab 12/20/22 0957 12/20/22 1012 12/21/22 0456 12/22/22 0619  NA 136 136 137 137  K 4.5 4.4 4.0 3.6  CL 100 102 105 106  CO2 21*  --  23 23  GLUCOSE 150* 151* 86 94  BUN 27* 26* 24* 16  CREATININE 1.59* 1.70* 1.26* 1.14*  CALCIUM 9.1  --  8.2* 7.2*   GFR: Estimated Creatinine Clearance: 44.2 mL/min (A) (by C-G formula based on SCr of 1.14 mg/dL (H)). Coagulation Profile: Recent Labs  Lab 12/20/22 0957  INR 1.1  CBG: Recent Labs  Lab 12/20/22 0950  GLUCAP 136*  No results found for this or any previous visit (from the past 240 hour(s)).  Antimicrobials: Anti-infectives (From admission, onward)    Start     Dose/Rate Route Frequency Ordered Stop   12/21/22 1000  trimethoprim (TRIMPEX) tablet 100 mg        100 mg Oral Daily 12/20/22 1501        Culture/Microbiology    Component Value Date/Time   SDES URINE, RANDOM 09/17/2017 1256   SPECREQUEST NONE 09/17/2017 1256   CULT (A) 09/17/2017 1256    <10,000 COLONIES/mL INSIGNIFICANT GROWTH Performed at [facility], [address], [place], [place] [address]    REPTSTATUS 09/18/2017 FINAL 09/17/2017 1256     Radiology Studies: MR BRAIN WO CONTRAST  Result Date: 12/20/2022 CLINICAL DATA:  Fall EXAM: MRI HEAD WITHOUT CONTRAST TECHNIQUE: Multiplanar, multiecho pulse sequences of the brain and surrounding structures were obtained without intravenous  contrast. COMPARISON:  09/18/2017 FINDINGS: Brain: No acute infarct, mass effect or extra-axial collection. No chronic microhemorrhage or siderosis. There is multifocal hyperintense T2-weighted signal within the white matter. Generalized volume loss. Old bilateral basal ganglia and cerebellar infarcts. The midline structures are normal. Vascular: Normal flow voids. Skull and upper cervical spine: Normal marrow signal. Sinuses/Orbits: Negative. Other: None. IMPRESSION: 1. No acute intracranial abnormality. 2. Old bilateral basal ganglia and cerebellar infarcts and findings of chronic microvascular ischemia. Electronically Signed   By: Deatra Robinson M.D.   On: 12/20/2022 23:25   DG Knee 1-2 Views Left  Result Date: 12/20/2022 CLINICAL DATA:  Left and right knee pain and bruising EXAM: LEFT KNEE - 1-2 VIEW; RIGHT KNEE - 1-2 VIEW COMPARISON:  Left knee radiographs 05/10/2021 FINDINGS: Right: No acute fracture or dislocation. Chondrocalcinosis in the lateral and medial compartments. Degenerative arthritis right knee with moderate medial and lateral compartment narrowing. Small knee joint effusion. Patellar enthesophytes. Soft tissues are unremarkable. Left: No acute fracture or dislocation. Degenerative arthritis left knee with advanced lateral compartment narrowing. Trace knee joint effusion. Superior patellar enthesophyte. Soft tissues are unremarkable. IMPRESSION: 1. No acute fracture or dislocation. 2. Degenerative arthritis of the knees with advanced lateral compartment narrowing on the left and moderate medial and lateral compartment narrowing on the right. Electronically Signed   By: Minerva Fester M.D.   On: 12/20/2022 16:12   DG Knee 1-2 Views Right  Result Date: 12/20/2022 CLINICAL DATA:  Left and right knee pain and bruising EXAM: LEFT KNEE - 1-2 VIEW; RIGHT KNEE - 1-2 VIEW COMPARISON:  Left knee radiographs 05/10/2021 FINDINGS: Right: No acute fracture or dislocation. Chondrocalcinosis in the lateral  and medial compartments. Degenerative arthritis right knee with moderate medial and lateral compartment narrowing. Small knee joint effusion. Patellar enthesophytes. Soft tissues are unremarkable. Left: No acute fracture or dislocation. Degenerative arthritis left knee with advanced lateral compartment narrowing. Trace knee  joint effusion. Superior patellar enthesophyte. Soft tissues are unremarkable. IMPRESSION: 1. No acute fracture or dislocation. 2. Degenerative arthritis of the knees with advanced lateral compartment narrowing on the left and moderate medial and lateral compartment narrowing on the right. Electronically Signed   By: Minerva Fester M.D.   On: 12/20/2022 16:12     LOS: 2 days   Lanae Boast, MD [facility]  12/22/2022, 1:07 PM

## 2022-12-23 DIAGNOSIS — D649 Anemia, unspecified: Secondary | ICD-10-CM | POA: Diagnosis not present

## 2022-12-23 LAB — CBC
HCT: 26.7 % — ABNORMAL LOW (ref 36.0–46.0)
Hemoglobin: 7.8 g/dL — ABNORMAL LOW (ref 12.0–15.0)
MCH: 23.5 pg — ABNORMAL LOW (ref 26.0–34.0)
MCHC: 29.2 g/dL — ABNORMAL LOW (ref 30.0–36.0)
MCV: 80.4 fL (ref 80.0–100.0)
Platelets: 249 10*3/uL (ref 150–400)
RBC: 3.32 MIL/uL — ABNORMAL LOW (ref 3.87–5.11)
RDW: 20.1 % — ABNORMAL HIGH (ref 11.5–15.5)
WBC: 5.5 10*3/uL (ref 4.0–10.5)
nRBC: 0.4 % — ABNORMAL HIGH (ref 0.0–0.2)

## 2022-12-23 LAB — BASIC METABOLIC PANEL
Anion gap: 6 (ref 5–15)
BUN: 12 mg/dL (ref 8–23)
CO2: 24 mmol/L (ref 22–32)
Calcium: 7.6 mg/dL — ABNORMAL LOW (ref 8.9–10.3)
Chloride: 109 mmol/L (ref 98–111)
Creatinine, Ser: 1.09 mg/dL — ABNORMAL HIGH (ref 0.44–1.00)
GFR, Estimated: 52 mL/min — ABNORMAL LOW (ref >60)
Glucose, Bld: 100 mg/dL — ABNORMAL HIGH (ref 70–99)
Potassium: 3.7 mmol/L (ref 3.5–5.1)
Sodium: 139 mmol/L (ref 135–145)

## 2022-12-23 MED ORDER — PANTOPRAZOLE SODIUM 40 MG PO TBEC
40.0000 mg | DELAYED_RELEASE_TABLET | Freq: Two times a day (BID) | ORAL | Status: DC
  Administered 2022-12-23 – 2022-12-27 (×8): 40 mg via ORAL
  Filled 2022-12-23 (×8): qty 1

## 2022-12-23 MED ORDER — ENSURE ENLIVE PO LIQD
237.0000 mL | Freq: Two times a day (BID) | ORAL | Status: DC
  Administered 2022-12-24 – 2022-12-26 (×5): 237 mL via ORAL

## 2022-12-23 NOTE — Progress Notes (Signed)
PROGRESS NOTE Brandy Paul  EPP:[id number] DOB: [birth year] DOA: 12/20/2022 PCP: Alysia Penna, MD  Brief Narrative/Hospital Course: 77 y.o.f w/ prior cva with residual right sided weakness, copd, anemia, ghtn, dm, breast cancer, who presented with complaint of frequent falls. Husband reports decreased PO as of late, frequent falls. Has bruising to head and knees. Currently complaining of b/l knee pain, says this is chronic. No melena or hematochezia. Husband brought her in because of the frequent falls. Denies new focal neurologic problems. EGD in 2021 showed esophagitis, patient doesn't think she's had endoscopy since.  No vomiting or diarrhea, no chest pain, no fevers or dysuria. 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  Her diet is being advanced.      Subjective: Patient seen and examined this morning husband at the bedside Patient Ater meal,resting well has no complaints   Assessment and Plan: Principal Problem:   Symptomatic anemia Active Problems:   CVA, old, aphasia   T2DM (type 2 diabetes mellitus) (HCC)   Anemia   Essential hypertension   COPD (chronic obstructive pulmonary disease) (HCC)   Malignant neoplasm of central portion of right breast in female, estrogen receptor positive (HCC)   Frequent falls Hx CVA With residual right sided weakness: Likely 2/2 prior stroke, anemia. Ct head/neck no acute finding, nothing acute.  Underwent MRI brain with old bilateral basal ganglia and cerebellar infarcts and microvascular ischemia.  Continue PT OT, awaiting for skilled nursing facility placement.  Home Plavix on hold > hopefully can resume after 5 days hold Continue Zetia and  atorvastatin.  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   Patient is tolerating diet hemoglobin in mid 7 g range.  Monitor intermittently  Recent Labs    12/20/22 0957 12/20/22 1012 12/21/22 0456 12/21/22 0624 12/22/22 0619 12/22/22 1028  HGB 7.0* 8.5* 6.7* 6.8* 7.5* 7.6*  MCV 79.2*  --  76.7*  --  79.2*  --   VITAMINB12 1,480*  --   --   --   --   --   FERRITIN 60  --   --   --   --   --     Knee pain chronic bilateral: X-ray obtained in the ED showing degenerative arthritis, no acute fracture or dislocation, continue pain control  AKI:  Likely prerenal 2/2 decreased po. Cr 1.59 from baseline of 1.2, improved, and now is off ivf. Recent Labs  Lab 12/20/22 0957 12/20/22 1012 12/21/22 0456 12/22/22 0619  BUN 27* 26* 24* 16  CREATININE 1.59* 1.70* 1.26* 1.14*    Frequent UTI: Continue home trimethoprim  MDD: Continue Wellbutrin and sertraline  Overactive bladder: Continue oxybutynin  COPD: Not in exacerbation continue home tiotropium  Hx breast cancer: S/p definitive surgery, cont home anastrozole   Obesity:Patient's Body mass index is 35.42 kg/m.:Will benefit with PCP follow-up,weight loss  healthy lifestyle.  DVT prophylaxis: Place and maintain sequential compression device Start: 12/20/22 1503 Code Status:   Code Status: Full Code Family  Communication: plan of care discussed with patient/husband at bedside. Patient status is: Inpatient because of anemia Level of care: Med-Surg   Dispo: The patient is from: home            Anticipated disposition: sng once bed available.   Objective: Vitals last 24 hrs: Vitals:   12/22/22 2004 12/22/22 2044 12/23/22  0605 12/23/22 0700  BP:  (!) 116/52 (!) 130/58 (!) 129/54  Pulse:  74 71 75  Resp:  18 18 16   Temp:  98.2 F (36.8 C) 97.6 F (36.4 C) 98.1 F (36.7 C)  TempSrc:  Oral Oral Oral  SpO2: 95% 96% 97% 96%  Weight:      Height:       Weight change:   Physical Examination: General exam: alert awake, oriented at baseline, older than stated age HEENT:Oral mucosa moist, Ear/Nose WNL grossly Respiratory system: Bilaterally clear BS,no use of accessory muscle Cardiovascular system: S1 & S2 +, No JVD. Gastrointestinal system: Abdomen soft,NT,ND, BS+ Nervous System: Alert, awake, chronic weakness on the right side, deformity in the right arm/wrist Extremities: LE edema neg,distal peripheral pulses palpable and warm.  Skin: No rashes,no icterus. MSK: Normal muscle bulk,tone, power  Medications reviewed:  Scheduled Meds:  sodium chloride   Intravenous Once   anastrozole  1 mg Oral Daily   atorvastatin  40 mg Oral QHS   buPROPion  150 mg Oral Daily   ezetimibe  10 mg Oral QHS   ferrous sulfate  325 mg Oral Once per day on Monday Wednesday Friday   isosorbide mononitrate  60 mg Oral Daily   oxybutynin  10 mg Oral Daily   pantoprazole (PROTONIX) IV  40 mg Intravenous Q12H   sertraline  100 mg Oral QHS   trimethoprim  100 mg Oral Daily   umeclidinium bromide  1 puff Inhalation QPM   Continuous Infusions:    Diet Order             DIET SOFT Room service appropriate? Yes; Fluid consistency: Thin  Diet effective now                   Intake/Output Summary (Last 24 hours) at 12/23/2022 1248 Last data filed at 12/23/2022 1610 Gross per 24 hour  Intake 240 ml  Output 500 ml  Net -260 ml   Net IO Since Admission: 673.88 mL [12/23/22 1248]  Wt Readings from Last 3 Encounters:  12/20/22 90.7 kg  08/01/22 90.7 kg  08/01/22 90.8 kg     Unresulted Labs (From admission, onward)     Start     Ordered   12/22/22 0500  Basic metabolic panel  Daily,   R      12/21/22 1150    12/22/22 0500  CBC  Daily,   R      12/21/22 1150   12/20/22 1025  Urinalysis, Routine w reflex microscopic -Urine, Clean Catch  Once,   URGENT       Question:  Specimen Source  Answer:  Urine, Clean Catch   12/20/22 1024          Data Reviewed: I have personally reviewed following labs and imaging studies CBC: Recent Labs  Lab 12/20/22 0957 12/20/22 1012 12/21/22 0456 12/21/22 0624 12/22/22 0619 12/22/22 1028  WBC 10.8*  --  7.9  --  6.4  --   HGB 7.0* 8.5* 6.7* 6.8* 7.5* 7.6*  HCT 25.5* 25.0* 23.0* 23.6* 25.2* 25.9*  MCV 79.2*  --  76.7*  --  79.2*  --   PLT 321  --  249  --  237  --    Basic Metabolic Panel: Recent Labs  Lab 12/20/22 0957 12/20/22 1012 12/21/22 0456 12/22/22 0619  NA 136 136 137 137  K 4.5 4.4 4.0 3.6  CL 100 102 105 106  CO2 21*  --  23 23  GLUCOSE 150* 151* 86 94  BUN 27* 26* 24* 16  CREATININE 1.59* 1.70* 1.26* 1.14*  CALCIUM 9.1  --  8.2* 7.2*   GFR: Estimated Creatinine Clearance: 44.2 mL/min (A) (by C-G formula based on SCr of 1.14 mg/dL (H)). Coagulation Profile: Recent Labs  Lab 12/20/22 0957  INR 1.1  CBG: Recent Labs  Lab 12/20/22 0950  GLUCAP 136*  No results found for this or any previous visit (from the past 240 hour(s)).  Antimicrobials: Anti-infectives (From admission, onward)    Start     Dose/Rate Route Frequency Ordered Stop   12/21/22 1000  trimethoprim (TRIMPEX) tablet 100 mg        100 mg Oral Daily 12/20/22 1501        Culture/Microbiology    Component Value Date/Time   SDES URINE, RANDOM 09/17/2017 1256   SPECREQUEST NONE 09/17/2017 1256   CULT (A) 09/17/2017 1256    <10,000 COLONIES/mL INSIGNIFICANT GROWTH Performed at [facility], [address], [place], [place] [address]    REPTSTATUS 09/18/2017 FINAL 09/17/2017 1256     Radiology Studies: No results found.   LOS: 3 days   Lanae Boast, MD [facility]  12/23/2022, 12:48 PM

## 2022-12-23 NOTE — Plan of Care (Signed)
Problem: Education: Goal: Knowledge of General Education information will improve Description: Including pain rating scale, medication(s)/side effects and non-pharmacologic comfort measures Outcome: Progressing Pt is confused and is A/O to self only.  She does not understands why she was admitted into the hospital.  Pt was having multiple falls at home.  Per husband decrease Po intake and increase weakness.    Problem: Clinical Measurements: Goal: Will remain free from infection Outcome: Progressing S/Sx of infection monitored and assessed q-shift.  Pt has remained afebrile thus far.     Problem: Clinical Measurements: Goal: Respiratory complications will improve Outcome: Progressing Respiratory status monitored and assessed q-shift.  Pt is on room air with PO2 at 96-97% and respiration rate of 16-18 breaths per minute.  Pt has not endorsed c/o SOB or DOE.    Problem: Clinical Measurements: Goal: Cardiovascular complication will be avoided Outcome: Progressing VS WNL thus far.   Problem: Activity: Goal: Risk for activity intolerance will decrease Outcome: Progressing Pt is dependent of all her ADLs.  She cannot get up OOB.  Per pt's husband she is bed/ wheelchair bound at home.     Problem: Nutrition: Goal: Adequate nutrition will be maintained Outcome: Progressing Pt is on a soft diet per MD's orders and can tolerate it w/o s/sx of abdominal pain/ distention or n/v.    Problem: Safety: Goal: Ability to remain free from injury will improve Outcome: Progressing Pt has remained free from falls thus far.  Instructed pt to utilize RN call light for assistance.  Hourly rounds performed.  Bed alarm implemented to keep pt safe from falls.  Settings activated to third most sensitive mode.  Bed in lowest position, locked with two upper side rails engaged.  Belongings and call light within reach.    Problem: Skin Integrity: Goal: Risk for impaired skin integrity will decrease Outcome:  Progressing Skin integrity monitored and assessed q-shift.  Pt is on q2 hourly turns to prevent further skin impairment.  Tubes and drains assessed for device related pressures sores.  Pt is incontinent of both bowel and bladder.  She is checked q2 hours for bowel incontinence.  Perineal care given promptly after each episode.  Pt has an external female catheter (purewick) in place to capture her urine.  Management of external female catheter (purewick) performed per [facility] policy, procedure and guidelines.

## 2022-12-24 DIAGNOSIS — D649 Anemia, unspecified: Secondary | ICD-10-CM | POA: Diagnosis not present

## 2022-12-24 LAB — TYPE AND SCREEN
ABO/RH(D): A POS
Antibody Screen: POSITIVE
DAT, IgG: NEGATIVE
Donor AG Type: NEGATIVE
Donor AG Type: NEGATIVE
PT AG Type: NEGATIVE
Unit division: 0
Unit division: 0
Unit division: 0

## 2022-12-24 LAB — BPAM RBC
Blood Product Expiration Date: 202410262359
Blood Product Expiration Date: 202410262359
Blood Product Expiration Date: 202410292359
ISSUE DATE / TIME: 202410021754
ISSUE DATE / TIME: 202410031006
Unit Type and Rh: 6200
Unit Type and Rh: 6200
Unit Type and Rh: 6200

## 2022-12-24 LAB — CBC
HCT: 26.3 % — ABNORMAL LOW (ref 36.0–46.0)
Hemoglobin: 7.9 g/dL — ABNORMAL LOW (ref 12.0–15.0)
MCH: 24.5 pg — ABNORMAL LOW (ref 26.0–34.0)
MCHC: 30 g/dL (ref 30.0–36.0)
MCV: 81.7 fL (ref 80.0–100.0)
Platelets: 243 10*3/uL (ref 150–400)
RBC: 3.22 MIL/uL — ABNORMAL LOW (ref 3.87–5.11)
RDW: 20.5 % — ABNORMAL HIGH (ref 11.5–15.5)
WBC: 5.7 10*3/uL (ref 4.0–10.5)
nRBC: 0 % (ref 0.0–0.2)

## 2022-12-24 LAB — BASIC METABOLIC PANEL
Anion gap: 7 (ref 5–15)
BUN: 11 mg/dL (ref 8–23)
CO2: 22 mmol/L (ref 22–32)
Calcium: 7.6 mg/dL — ABNORMAL LOW (ref 8.9–10.3)
Chloride: 108 mmol/L (ref 98–111)
Creatinine, Ser: 1 mg/dL (ref 0.44–1.00)
GFR, Estimated: 58 mL/min — ABNORMAL LOW (ref >60)
Glucose, Bld: 90 mg/dL (ref 70–99)
Potassium: 3.6 mmol/L (ref 3.5–5.1)
Sodium: 137 mmol/L (ref 135–145)

## 2022-12-24 NOTE — Progress Notes (Signed)
PROGRESS NOTE Brandy Paul  JYN:[id number] DOB: 12-Mar-1946 DOA: 12/20/2022 PCP: Alysia Penna, MD  Brief Narrative/Hospital Course: 77 y.o.f w/ prior cva with residual right sided weakness, copd, anemia, ghtn, dm, breast cancer, who presented with complaint of frequent falls. Husband reports decreased PO as of late, frequent falls. Has bruising to head and knees. Currently complaining of b/l knee pain, says this is chronic. No melena or hematochezia. Husband brought her in because of the frequent falls. Denies new focal neurologic problems. EGD in 2021 showed esophagitis, patient doesn't think she's had endoscopy since.  No vomiting or diarrhea, no chest pain, no fevers or dysuria. 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  Her diet is being advanced.  At this time patient remains hemodynamically stable tolerating diet hemoglobin is stable and 78 g range.    Subjective: Patient seen and examined this morning Husband and father family at bedside She appears cheerful Tolerating diet Overnight she has been afebrile BP stable.   Labs this morning reviewed hemoglobin stable and uptrending 7.8 g, stable renal function    Assessment and Plan: Principal Problem:   Symptomatic anemia Active Problems:   CVA, old, aphasia   T2DM (type 2 diabetes mellitus) (HCC)   Anemia   Essential hypertension   COPD (chronic obstructive pulmonary disease) (HCC)   Malignant neoplasm of central portion of right breast in female, estrogen receptor positive (HCC)   Frequent falls Hx CVA With residual right sided weakness: Likely 2/2 prior stroke, anemia. Ct head/neck no acute finding, nothing acute.  Underwent MRI brain with  old bilateral basal ganglia and cerebellar infarcts and microvascular ischemia. Home Plavix on hold > hopefully can resume after 5 days hold.Continue Zetia and atorvastatin. PT OT has recommended SNF and  waiting on placement.  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  So far tolerating diet hemoglobin remains stable and uptrending, follow-up outpatient with GI Recent Labs    12/20/22 0957 12/20/22 1012 12/21/22 0624 12/22/22 0619 12/22/22 1028 12/23/22 1233 12/24/22 0814  HGB 7.0*   < > 6.8* 7.5* 7.6* 7.8* 7.9*  MCV 79.2*   < >  --  79.2*  --  80.4 81.7  VITAMINB12 1,480*  --   --   --   --   --   --   FERRITIN 60  --   --   --   --   --   --    < > = values in this interval not displayed.    Knee pain chronic bilateral: X-ray obtained in the ED showing degenerative arthritis, no acute fracture or dislocation, continue pain control  AKI:  Likely prerenal 2/2 decreased po. Cr 1.59 from baseline of 1.2. aki is improved encourage oral hydration Recent Labs  Lab 12/20/22 1012 12/21/22 0456 12/22/22 0619 12/23/22 1233 12/24/22 0814  BUN 26* 24* 16 12 11   CREATININE 1.70* 1.26* 1.14* 1.09* 1.00    Frequent UTI: Continue home trimethoprim  MDD: Continue Wellbutrin and  sertraline  Overactive bladder: Continue oxybutynin  COPD: Not in exacerbation continue home tiotropium  Hx breast cancer: S/p definitive surgery, cont home anastrozole   Obesity:Patient's Body mass index is 35.42 kg/m.:Will benefit with PCP follow-up,weight loss  healthy lifestyle.  DVT prophylaxis: Place and maintain sequential compression device Start: 12/20/22 1503 Code Status:   Code Status:  Full Code Family Communication: plan of care discussed with patient/husband at bedside. Patient status is: Inpatient because of anemia Level of care: Med-Surg   Dispo: The patient is from: home            Anticipated disposition: sng once bed available.   Objective: Vitals last 24 hrs: Vitals:   12/23/22 1700 12/23/22 2026 12/24/22 0332 12/24/22 0735  BP: 122/67 (!) 113/55 (!) 122/51 (!) 125/52  Pulse: 100 96 78 79  Resp: 16  18 16   Temp: 98.3 F (36.8 C) 98.4 F (36.9 C) 98.2 F (36.8 C) 98.5 F (36.9 C)  TempSrc: Oral Oral Oral Oral  SpO2: 95% 95% 95% 94%  Weight:      Height:       Weight change:   Physical Examination: General exam: alert awake, HEENT:Oral mucosa moist, Ear/Nose WNL grossly Respiratory system: Bilaterally clear BS,no use of accessory muscle Cardiovascular system: S1 & S2 +, No JVD. Gastrointestinal system: Abdomen soft,NT,ND, BS+ Nervous System: Alert, awake, weakness on her right side and deformity on the right wrist Extremities: LE edema neg,distal peripheral pulses palpable and warm.  Skin: No rashes,no icterus. MSK: Normal muscle bulk,tone, power   Medications reviewed:  Scheduled Meds:  sodium chloride   Intravenous Once   anastrozole  1 mg Oral Daily   atorvastatin  40 mg Oral QHS   buPROPion  150 mg Oral Daily   ezetimibe  10 mg Oral QHS   feeding supplement  237 mL Oral BID BM   ferrous sulfate  325 mg Oral Once per day on Monday Wednesday Friday   isosorbide mononitrate  60 mg Oral Daily   oxybutynin  10 mg Oral Daily   pantoprazole  40 mg Oral BID   sertraline  100 mg Oral QHS   trimethoprim  100 mg Oral Daily   umeclidinium bromide  1 puff Inhalation QPM   Continuous Infusions:    Diet Order             DIET SOFT Room service appropriate? Yes; Fluid consistency: Thin  Diet effective now                   Intake/Output Summary (Last 24 hours) at 12/24/2022 1204 Last data filed at 12/23/2022 2030 Gross per 24 hour   Intake 100 ml  Output 350 ml  Net -250 ml   Net IO Since Admission: 423.88 mL [12/24/22 1204]  Wt Readings from Last 3 Encounters:  12/20/22 90.7 kg  08/01/22 90.7 kg  08/01/22 90.8 kg     Unresulted Labs (From admission, onward)     Start     Ordered   12/22/22 0500  Basic metabolic panel  Daily,   R      12/21/22 1150   12/22/22 0500  CBC  Daily,   R      12/21/22 1150   12/20/22 1025  Urinalysis, Routine w reflex microscopic -Urine, Clean Catch  Once,   URGENT       Question:  Specimen Source  Answer:  Urine, Clean Catch   12/20/22 1024  Data Reviewed: I have personally reviewed following labs and imaging studies CBC: Recent Labs  Lab 12/20/22 0957 12/20/22 1012 12/21/22 0456 12/21/22 0624 12/22/22 0619 12/22/22 1028 12/23/22 1233 12/24/22 0814  WBC 10.8*  --  7.9  --  6.4  --  5.5 5.7  HGB 7.0*   < > 6.7* 6.8* 7.5* 7.6* 7.8* 7.9*  HCT 25.5*   < > 23.0* 23.6* 25.2* 25.9* 26.7* 26.3*  MCV 79.2*  --  76.7*  --  79.2*  --  80.4 81.7  PLT 321  --  249  --  237  --  249 243   < > = values in this interval not displayed.   Basic Metabolic Panel: Recent Labs  Lab 12/20/22 0957 12/20/22 1012 12/21/22 0456 12/22/22 0619 12/23/22 1233 12/24/22 0814  NA 136 136 137 137 139 137  K 4.5 4.4 4.0 3.6 3.7 3.6  CL 100 102 105 106 109 108  CO2 21*  --  23 23 24 22   GLUCOSE 150* 151* 86 94 100* 90  BUN 27* 26* 24* 16 12 11   CREATININE 1.59* 1.70* 1.26* 1.14* 1.09* 1.00  CALCIUM 9.1  --  8.2* 7.2* 7.6* 7.6*   GFR: Estimated Creatinine Clearance: 50.4 mL/min (by C-G formula based on SCr of 1 mg/dL). Coagulation Profile: Recent Labs  Lab 12/20/22 0957  INR 1.1  CBG: Recent Labs  Lab 12/20/22 0950  GLUCAP 136*  No results found for this or any previous visit (from the past 240 hour(s)).  Antimicrobials: Anti-infectives (From admission, onward)    Start     Dose/Rate Route Frequency Ordered Stop   12/21/22 1000  trimethoprim (TRIMPEX) tablet 100 mg         100 mg Oral Daily 12/20/22 1501        Culture/Microbiology    Component Value Date/Time   SDES URINE, RANDOM 09/17/2017 1256   SPECREQUEST NONE 09/17/2017 1256   CULT (A) 09/17/2017 1256    <10,000 COLONIES/mL INSIGNIFICANT GROWTH Performed at [facility], [address], [place], [place] [address]    REPTSTATUS 09/18/2017 FINAL 09/17/2017 1256     Radiology Studies: No results found.   LOS: 4 days   Lanae Boast, MD [facility]  12/24/2022, 12:04 PM

## 2022-12-25 DIAGNOSIS — D649 Anemia, unspecified: Secondary | ICD-10-CM | POA: Diagnosis not present

## 2022-12-25 LAB — CBC
HCT: 26.2 % — ABNORMAL LOW (ref 36.0–46.0)
Hemoglobin: 7.8 g/dL — ABNORMAL LOW (ref 12.0–15.0)
MCH: 23.9 pg — ABNORMAL LOW (ref 26.0–34.0)
MCHC: 29.8 g/dL — ABNORMAL LOW (ref 30.0–36.0)
MCV: 80.4 fL (ref 80.0–100.0)
Platelets: 251 10*3/uL (ref 150–400)
RBC: 3.26 MIL/uL — ABNORMAL LOW (ref 3.87–5.11)
RDW: 21.2 % — ABNORMAL HIGH (ref 11.5–15.5)
WBC: 5.3 10*3/uL (ref 4.0–10.5)
nRBC: 0 % (ref 0.0–0.2)

## 2022-12-25 LAB — BASIC METABOLIC PANEL
Anion gap: 6 (ref 5–15)
BUN: 11 mg/dL (ref 8–23)
CO2: 24 mmol/L (ref 22–32)
Calcium: 7.8 mg/dL — ABNORMAL LOW (ref 8.9–10.3)
Chloride: 109 mmol/L (ref 98–111)
Creatinine, Ser: 0.99 mg/dL (ref 0.44–1.00)
GFR, Estimated: 59 mL/min — ABNORMAL LOW (ref >60)
Glucose, Bld: 87 mg/dL (ref 70–99)
Potassium: 3.5 mmol/L (ref 3.5–5.1)
Sodium: 139 mmol/L (ref 135–145)

## 2022-12-25 NOTE — Care Management Important Message (Signed)
Important Message  Patient Details  Name: Brandy Paul MRN: [id number] Date of Birth: 1945/09/27   Important Message Given:  Yes - Medicare IM     Dorena Bodo 12/25/2022, 2:26 PM

## 2022-12-25 NOTE — Progress Notes (Signed)
Physical Therapy Treatment Patient Details Name: Brandy Paul MRN: [id number] DOB: [birth year] Today's Date: 12/25/2022   History of Present Illness Pt is 77 yo presenting [facility] due to frequent falls. 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    PT Comments  Pt remains very anxious with mobility due to fear of falling, however she is able to progress to standing x2 during session today. Pt demonstrates a posterior lean in sitting and standing, able to correct the lean in sitting with max cues for increased trunk flexion. Pt remains at a high risk for falls due to weakness and impaired balance. PT continues to recommend short term inpatient PT services at the time of discharge.    If plan is discharge home, recommend the following: Two people to help with walking and/or transfers;Assistance with cooking/housework;Assist for transportation;Supervision due to cognitive status;Help with stairs or ramp for entrance   Can travel by private vehicle     No  Equipment Recommendations  Hoyer lift    Recommendations for Other Services       Precautions / Restrictions Precautions Precautions: Fall Precaution Comments: previous R sided weakness from CVA; contracture RUE, especially R hand Required Braces or Orthoses: Splint/Cast Splint/Cast: R hand/wrist splint Restrictions Weight Bearing Restrictions: No     Mobility  Bed Mobility Overal bed mobility: Needs Assistance Bed Mobility: Supine to Sit, Sit to Supine, Rolling Rolling: Max assist   Supine to sit: Max assist, HOB elevated Sit to supine: Total assist        Transfers Overall transfer level: Needs assistance Equipment used: 1 person hand held assist Transfers: Sit to/from Stand Sit to Stand: Max assist           General transfer comment: pt stands twice with BUE support of PT at trunk along with L knee block. Standing tolerance is poor due to reports of R foot/ankle pain     Ambulation/Gait                   Stairs             Wheelchair Mobility     Tilt Bed    Modified Rankin (Stroke Patients Only)       Balance Overall balance assessment: Needs assistance Sitting-balance support: Single extremity supported, Feet supported Sitting balance-Leahy Scale: Poor Sitting balance - Comments: minA, left lean. PT encourages increased trunk flexion, cues to place L hand on knee instead of behind pt. Pt balance improves to CGA. Postural control: Posterior lean, Left lateral lean Standing balance support: Bilateral upper extremity supported Standing balance-Leahy Scale: Poor Standing balance comment: maxA                            Cognition Arousal: Alert Behavior During Therapy: Anxious Overall Cognitive Status: Impaired/Different from baseline Area of Impairment: Following commands, Safety/judgement, Awareness, Problem solving                       Following Commands: Follows one step commands with increased time Safety/Judgement:  (hyper-aware of deficits, limiting mobility) Awareness: Emergent Problem Solving: Slow processing          Exercises      General Comments General comments (skin integrity, edema, etc.): VSS on RA      Pertinent Vitals/Pain Pain Assessment Pain Assessment: Faces Faces Pain Scale: Hurts even more Pain Location: R foot/ankle Pain Descriptors / Indicators: Moaning  Pain Intervention(s): Limited activity within patient's tolerance    Home Living                          Prior Function            PT Goals (current goals can now be found in the care plan section) Acute Rehab PT Goals Patient Stated Goal: To improve pt mobility Progress towards PT goals: Progressing toward goals    Frequency    Min 1X/week      PT Plan      Co-evaluation              AM-PAC PT "6 Clicks" Mobility   Outcome Measure  Help needed turning from your back to your  side while in a flat bed without using bedrails?: A Lot Help needed moving from lying on your back to sitting on the side of a flat bed without using bedrails?: A Lot Help needed moving to and from a bed to a chair (including a wheelchair)?: Total Help needed standing up from a chair using your arms (e.g., wheelchair or bedside chair)?: A Lot Help needed to walk in hospital room?: Total Help needed climbing 3-5 steps with a railing? : Total 6 Click Score: 9    End of Session Equipment Utilized During Treatment: Gait belt Activity Tolerance: Patient tolerated treatment well Patient left: in bed;with call bell/phone within reach;with bed alarm set Nurse Communication: Mobility status PT Visit Diagnosis: Other abnormalities of gait and mobility (R26.89);Muscle weakness (generalized) (M62.81);History of falling (Z91.81)     Time: 0102-7253 PT Time Calculation (min) (ACUTE ONLY): 29 min  Charges:    $Therapeutic Activity: 23-37 mins PT General Charges $$ ACUTE PT VISIT: 1 Visit                     Arlyss Gandy, PT, DPT Acute Rehabilitation Office [phone]    Arlyss Gandy 12/25/2022, 11:01 AM

## 2022-12-25 NOTE — TOC Progression Note (Signed)
Transition of Care [facility]) - Progression Note    Patient Details  Name: Brandy Paul MRN: [id number] Date of Birth: [birth year]  [facility] CM/SW Contact  Samari Bittinger A [place], [place] Phone Number: 12/25/2022, 3:14 PM  Clinical Narrative:     Update 1019 Pt's husband stated that he chose [facility] then [facility] as second choice. CSW to follow up with facilities regarding bed availability.     Update 12/26/22 0865 CSW met with pt and pt's husband at bedside and provided bed offers. He said he would reach out to his daughter-in-law and discuss offers and get back to CSW with decision by today.   CSW contacted pt's spouse, Gelene Mink to provide bed offers. There was no answer. CSW left voicemail with contact information to reach out to CSW.   TOC will continue to follow.    Expected Discharge Plan: Skilled Nursing Facility Barriers to Discharge: SNF Pending bed offer, Medical stability, Bed choice  Expected Discharge Plan and Services       Living arrangements for the past 2 months: Single Family Home                                       Social Determinants of Health (SDOH) Interventions SDOH Screenings   Food Insecurity: No Food Insecurity (12/20/2022)  Housing: Low Risk  (12/20/2022)  Transportation Needs: No Transportation Needs (12/20/2022)  Utilities: Not At Risk (12/20/2022)  Tobacco Use: Medium Risk (12/20/2022)    Readmission Risk Interventions     No data to display

## 2022-12-25 NOTE — Progress Notes (Signed)
PROGRESS NOTE    Brandy Paul  WUX:[id number] DOB: [birth year] DOA: 12/20/2022 PCP: Alysia Penna, MD    Brief Narrative:  77 y.o.f w/ prior cva with residual right sided weakness, copd, anemia, ghtn, dm, breast cancer, who presented with complaint of frequent falls. Husband reports decreased PO as of late, frequent falls. Has bruising to head and knees. Currently complaining of b/l knee pain, says this is chronic. No melena or hematochezia. Husband brought her in because of the frequent falls. Denies new focal neurologic problems. EGD in 2021 showed esophagitis, patient doesn't think she's had endoscopy since.  No vomiting or diarrhea, no chest pain, no fevers or dysuria. 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  At this time patient remains hemodynamically stable tolerating diet hemoglobin is stable and 7- 8 g range.  Medically stable to go to a SNF.   Assessment & Plan:   Frequent falls Hx CVA With residual right sided weakness: Likely 2/2 prior stroke, anemia. Ct head/neck no acute finding, nothing acute.  Underwent MRI brain with old bilateral basal ganglia and cerebellar infarcts and microvascular ischemia. Home Plavix on hold > will resume . Continue Zetia and atorvastatin. PT OT has recommended SNF and  waiting on placement. This is likely due to deconditioning, worsening symptoms of previous hemiplegia.   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  So far tolerating diet hemoglobin remains stable and uptrending, follow-up outpatient with GI Recent Labs (within last 365 days)           Recent Labs    12/20/22 0957 12/20/22 1012 12/21/22 0624 12/22/22 0619 12/22/22 1028 12/23/22 1233 12/24/22 0814  HGB 7.0*   < > 6.8* 7.5* 7.6* 7.8* 7.9*  MCV 79.2*   < >  --  79.2*  --  80.4 81.7  VITAMINB12 1,480*  --   --   --   --   --   --   FERRITIN 60  --   --   --   --   --   --    < > = values in this interval not displayed.      Knee pain chronic bilateral: X-ray obtained in the ED showing degenerative arthritis, no acute fracture or dislocation, continue pain control   AKI:  Likely prerenal 2/2 decreased po. Cr 1.59 from baseline of 1.2. aki is improved encourage oral hydration Last Labs         Recent Labs  Lab 12/20/22 1012 12/21/22 0456 12/22/22 0619 12/23/22 1233 12/24/22 0814  BUN 26* 24* 16 12 11   CREATININE 1.70* 1.26* 1.14* 1.09* 1.00      Frequent UTI: Continue home trimethoprim   MDD: Continue Wellbutrin and sertraline   Overactive bladder: Continue oxybutynin   COPD: Not in exacerbation continue home tiotropium   Hx breast cancer: S/p definitive surgery, cont home anastrozole    Obesity:Patient's Body mass index is 35.42 kg/m.:Will benefit with PCP follow-up,weight loss  healthy  lifestyle.   DVT prophylaxis: Place and maintain sequential compression device Start: 12/20/22 1503   Code Status: Full code Family Communication: Husband at the bedside Disposition Plan: Status is: Inpatient Remains inpatient appropriate because: Waiting for skilled nursing facility.  Medically stable.     Consultants:  None  Procedures:  None  Antimicrobials:  Trimethoprim, long-term use   Subjective: Patient seen and examined.  Her husband was at the bedside.  Patient herself denied any complaints.  No other overnight  events.  Objective: Vitals:   12/24/22 1635 12/24/22 2044 12/25/22 0534 12/25/22 0719  BP: (!) 112/47 (!) 99/40 (!) 111/51 (!) 121/51  Pulse: 83 80 75 73  Resp: 16 18 19 16   Temp: 98.9 F (37.2 C) 98.5 F (36.9 C) 98.5 F (36.9 C) 98.9 F (37.2 C)  TempSrc: Oral Oral Oral Oral  SpO2: 95% 95% 96% 94%  Weight:      Height:       No intake or output data in the 24 hours ending 12/25/22 1404 Filed Weights   12/20/22 0951  Weight: 90.7 kg    Examination:  General exam: Appears calm and comfortable  Chronically sick looking but not in any distress. Patient is alert awake and oriented. 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 Contracted right wrist. Respiratory system: Clear to auscultation. Respiratory effort normal. Cardiovascular system: S1 & S2 heard, RRR. No pedal edema. Gastrointestinal system: Abdomen is nondistended, soft and nontender. No organomegaly or masses felt. Normal bowel sounds heard.     Data Reviewed: I have personally reviewed following labs and imaging studies  CBC: Recent Labs  Lab 12/21/22 0456 12/21/22 0624 12/22/22 0619 12/22/22 1028 12/23/22 1233 12/24/22 0814 12/25/22 0834  WBC 7.9  --  6.4  --  5.5 5.7 5.3  HGB 6.7*   < > 7.5* 7.6* 7.8* 7.9* 7.8*  HCT 23.0*   < > 25.2* 25.9* 26.7* 26.3* 26.2*  MCV 76.7*  --  79.2*  --  80.4 81.7 80.4  PLT 249  --  237  --  249 243 251   < > = values in this interval not displayed.   Basic Metabolic Panel: Recent Labs  Lab 12/21/22 0456 12/22/22 0619 12/23/22 1233 12/24/22 0814 12/25/22 0834  NA 137 137 139 137 139  K 4.0 3.6 3.7 3.6 3.5  CL 105 106 109 108 109  CO2 23 23 24 22 24   GLUCOSE 86 94 100* 90 87  BUN 24* 16 12 11 11   CREATININE 1.26* 1.14* 1.09* 1.00 0.99  CALCIUM 8.2* 7.2* 7.6* 7.6* 7.8*   GFR: Estimated Creatinine Clearance: 50.9 mL/min (by C-G formula based on SCr of 0.99 mg/dL). Liver Function Tests: No results for input(s): "AST",  "ALT", "ALKPHOS", "BILITOT", "PROT", "ALBUMIN" in the last 168 hours. No results for input(s): "LIPASE", "AMYLASE" in the last 168 hours. No results for input(s): "AMMONIA" in the last 168 hours. Coagulation Profile: Recent Labs  Lab 12/20/22 0957  INR 1.1   Cardiac Enzymes: No results for input(s): "CKTOTAL", "CKMB", "CKMBINDEX", "TROPONINI" in the last 168 hours. BNP (last 3 results) No results for input(s): "PROBNP" in the last 8760 hours. HbA1C: No results for input(s): "HGBA1C" in the last 72 hours. CBG: Recent Labs  Lab 12/20/22 0950  GLUCAP 136*   Lipid Profile: No results for input(s): "CHOL", "HDL", "LDLCALC", "TRIG", "CHOLHDL", "LDLDIRECT" in the last 72 hours. Thyroid Function Tests: No results for input(s): "TSH", "T4TOTAL", "FREET4", "T3FREE", "THYROIDAB" in  the last 72 hours. Anemia Panel: No results for input(s): "VITAMINB12", "FOLATE", "FERRITIN", "TIBC", "IRON", "RETICCTPCT" in the last 72 hours. Sepsis Labs: No results for input(s): "PROCALCITON", "LATICACIDVEN" in the last 168 hours.  No results found for this or any previous visit (from the past 240 hour(s)).       Radiology Studies: No results found.      Scheduled Meds:  sodium chloride   Intravenous Once   anastrozole  1 mg Oral Daily   atorvastatin  40 mg Oral QHS   buPROPion  150 mg Oral Daily   ezetimibe  10 mg Oral QHS   feeding supplement  237 mL Oral BID BM   ferrous sulfate  325 mg Oral Once per day on Monday Wednesday Friday   isosorbide mononitrate  60 mg Oral Daily   oxybutynin  10 mg Oral Daily   pantoprazole  40 mg Oral BID   sertraline  100 mg Oral QHS   trimethoprim  100 mg Oral Daily   umeclidinium bromide  1 puff Inhalation QPM   Continuous Infusions:   LOS: 5 days    Time spent: 35 minutes    Dorcas Carrow, MD [facility]

## 2022-12-26 DIAGNOSIS — D649 Anemia, unspecified: Secondary | ICD-10-CM | POA: Diagnosis not present

## 2022-12-26 LAB — BASIC METABOLIC PANEL
Anion gap: 6 (ref 5–15)
BUN: 13 mg/dL (ref 8–23)
CO2: 25 mmol/L (ref 22–32)
Calcium: 7.8 mg/dL — ABNORMAL LOW (ref 8.9–10.3)
Chloride: 107 mmol/L (ref 98–111)
Creatinine, Ser: 1.09 mg/dL — ABNORMAL HIGH (ref 0.44–1.00)
GFR, Estimated: 52 mL/min — ABNORMAL LOW (ref >60)
Glucose, Bld: 98 mg/dL (ref 70–99)
Potassium: 3.7 mmol/L (ref 3.5–5.1)
Sodium: 138 mmol/L (ref 135–145)

## 2022-12-26 LAB — CBC
HCT: 26.4 % — ABNORMAL LOW (ref 36.0–46.0)
Hemoglobin: 7.9 g/dL — ABNORMAL LOW (ref 12.0–15.0)
MCH: 24.7 pg — ABNORMAL LOW (ref 26.0–34.0)
MCHC: 29.9 g/dL — ABNORMAL LOW (ref 30.0–36.0)
MCV: 82.5 fL (ref 80.0–100.0)
Platelets: 241 10*3/uL (ref 150–400)
RBC: 3.2 MIL/uL — ABNORMAL LOW (ref 3.87–5.11)
RDW: 21.7 % — ABNORMAL HIGH (ref 11.5–15.5)
WBC: 5.4 10*3/uL (ref 4.0–10.5)
nRBC: 0 % (ref 0.0–0.2)

## 2022-12-26 MED ORDER — CLOPIDOGREL BISULFATE 75 MG PO TABS
75.0000 mg | ORAL_TABLET | Freq: Every day | ORAL | Status: DC
  Administered 2022-12-26 – 2022-12-27 (×2): 75 mg via ORAL
  Filled 2022-12-26 (×2): qty 1

## 2022-12-26 NOTE — Progress Notes (Signed)
And likely discharge tomorrow morning. PROGRESS NOTE    Brandy Paul  JYN:[id number] DOB: [birth year] DOA: 12/20/2022 PCP: Alysia Penna, MD    Brief Narrative:  77 y.o.f w/ prior cva with residual right sided weakness, copd, anemia, ghtn, dm, breast cancer, who presented with complaint of frequent falls. Husband reports decreased PO as of late, frequent falls. Has bruising to head and knees. Currently complaining of b/l knee pain, says this is chronic. No melena or hematochezia. Husband brought her in because of the frequent falls. Denies new focal neurologic problems. EGD in 2021 showed esophagitis, patient doesn't think she's had endoscopy since.  No vomiting or diarrhea, no chest pain, no fevers or dysuria. 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  At this time patient remains hemodynamically stable tolerating diet hemoglobin is stable and 7- 8 g range.  Medically stable to go to a SNF and likely discharge tomorrow morning.    Assessment & Plan:   Frequent falls Hx CVA With residual right sided weakness: Likely 2/2 prior stroke, anemia. Ct head/neck no acute finding, nothing acute.  Underwent MRI brain with old bilateral basal ganglia and cerebellar infarcts and microvascular ischemia. Home Plavix on hold > will resume . Continue Zetia and atorvastatin. PT OT has recommended SNF and  waiting on placement. This is likely due to deconditioning, worsening symptoms of previous hemiplegia.   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  So far tolerating diet hemoglobin remains stable and uptrending, follow-up outpatient with GI Recent Labs (within last 365 days)           Recent Labs    12/20/22 0957 12/20/22 1012 12/21/22 0624 12/22/22 0619 12/22/22 1028 12/23/22 1233 12/24/22 0814  HGB 7.0*   < > 6.8* 7.5* 7.6* 7.8* 7.9*  MCV 79.2*   < >  --  79.2*  --  80.4 81.7  VITAMINB12 1,480*  --   --   --   --   --   --   FERRITIN 60  --   --   --   --   --   --    < > = values in this interval not displayed.      Knee pain chronic bilateral: X-ray obtained in the ED showing degenerative arthritis, no acute fracture or dislocation, continue pain control   AKI:  Likely prerenal 2/2 decreased po. Cr 1.59 from baseline of 1.2. aki is improved encourage oral hydration Last Labs         Recent Labs  Lab 12/20/22 1012 12/21/22 0456 12/22/22 0619 12/23/22 1233 12/24/22 0814  BUN 26* 24* 16 12 11   CREATININE 1.70* 1.26* 1.14* 1.09* 1.00      Frequent UTI: Continue home trimethoprim   MDD: Continue Wellbutrin and sertraline   Overactive bladder: Continue oxybutynin   COPD: Not in exacerbation continue home tiotropium   Hx breast cancer: S/p definitive surgery, cont home anastrozole    Obesity:Patient's Body mass  index is 35.42 kg/m.:Will benefit with PCP follow-up,weight loss  healthy lifestyle.   DVT prophylaxis: Place and maintain sequential compression device Start: 12/20/22 1503   Code Status: Full code Family Communication: Husband at the bedside Disposition Plan: Status is: Inpatient Remains inpatient appropriate because: Waiting for skilled nursing facility.  Medically stable.     Consultants:  None  Procedures:  None  Antimicrobials:  Trimethoprim, long-term use   Subjective:  Patient seen and examined.  No overnight events.   Husband at the bedside.    Objective: Vitals:   12/25/22 2241 12/26/22 0026 12/26/22 0617 12/26/22 0753  BP: (!) 111/50 (!) 112/51 (!) 131/53 (!) 116/47  Pulse: 80 72 69 73  Resp:  16 18 17   Temp:  98.9 F (37.2 C) 97.9 F (36.6 C) 98.3 F (36.8 C)  TempSrc:  Oral Oral Oral  SpO2:  95% 94% 93%  Weight:      Height:        Intake/Output Summary (Last 24 hours) at 12/26/2022 1353 Last data filed at 12/26/2022 0654 Gross per 24 hour  Intake 60 ml  Output 400 ml  Net -340 ml   Filed Weights   12/20/22 0951  Weight: 90.7 kg    Examination:  General exam: Appears calm and comfortable  Frail and debilitated. Patient is alert awake and oriented. 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 Contracted right wrist. Respiratory system: Clear to auscultation. Respiratory effort normal. Cardiovascular system: S1 & S2 heard, RRR. No pedal edema. Gastrointestinal system: Abdomen is nondistended, soft and nontender. No organomegaly or masses felt. Normal bowel sounds heard.     Data Reviewed: I have personally reviewed following labs and imaging studies  CBC: Recent Labs  Lab 12/22/22 0619 12/22/22 1028 12/23/22 1233 12/24/22 0814 12/25/22 0834 12/26/22 0453  WBC 6.4  --  5.5 5.7 5.3 5.4  HGB 7.5* 7.6* 7.8* 7.9* 7.8* 7.9*  HCT 25.2* 25.9* 26.7* 26.3* 26.2* 26.4*  MCV 79.2*  --  80.4 81.7 80.4 82.5  PLT 237  --  249 243 251 241   Basic Metabolic Panel: Recent Labs  Lab 12/22/22 0619 12/23/22 1233 12/24/22 0814 12/25/22 0834 12/26/22 0453  NA 137 139 137 139 138  K 3.6 3.7 3.6 3.5 3.7  CL 106 109 108 109 107  CO2 23 24 22 24 25   GLUCOSE 94 100* 90 87 98  BUN 16 12 11 11 13   CREATININE 1.14* 1.09* 1.00 0.99 1.09*  CALCIUM 7.2* 7.6* 7.6* 7.8* 7.8*   GFR: Estimated Creatinine Clearance: 46.2 mL/min (A) (by C-G formula based on SCr of 1.09 mg/dL (H)). Liver Function Tests: No results for input(s): "AST", "ALT", "ALKPHOS",  "BILITOT", "PROT", "ALBUMIN" in the last 168 hours. No results for input(s): "LIPASE", "AMYLASE" in the last 168 hours. No results for input(s): "AMMONIA" in the last 168 hours. Coagulation Profile: Recent Labs  Lab 12/20/22 0957  INR 1.1   Cardiac Enzymes: No results for input(s): "CKTOTAL", "CKMB", "CKMBINDEX", "TROPONINI" in the last 168 hours. BNP (last 3 results) No results for input(s): "PROBNP" in the last 8760 hours. HbA1C: No results for input(s): "HGBA1C" in the last 72 hours. CBG: Recent Labs  Lab 12/20/22 0950  GLUCAP 136*   Lipid Profile: No results for input(s): "CHOL", "HDL", "LDLCALC", "TRIG", "CHOLHDL", "LDLDIRECT" in the last 72 hours. Thyroid Function Tests: No results for input(s): "TSH", "T4TOTAL", "FREET4", "T3FREE", "THYROIDAB" in the last 72 hours. Anemia Panel: No results for input(s): "  VITAMINB12", "FOLATE", "FERRITIN", "TIBC", "IRON", "RETICCTPCT" in the last 72 hours. Sepsis Labs: No results for input(s): "PROCALCITON", "LATICACIDVEN" in the last 168 hours.  No results found for this or any previous visit (from the past 240 hour(s)).       Radiology Studies: No results found.      Scheduled Meds:  sodium chloride   Intravenous Once   anastrozole  1 mg Oral Daily   atorvastatin  40 mg Oral QHS   buPROPion  150 mg Oral Daily   clopidogrel  75 mg Oral Daily   ezetimibe  10 mg Oral QHS   feeding supplement  237 mL Oral BID BM   ferrous sulfate  325 mg Oral Once per day on Monday Wednesday Friday   isosorbide mononitrate  60 mg Oral Daily   oxybutynin  10 mg Oral Daily   pantoprazole  40 mg Oral BID   sertraline  100 mg Oral QHS   trimethoprim  100 mg Oral Daily   umeclidinium bromide  1 puff Inhalation QPM   Continuous Infusions:   LOS: 6 days    Time spent: 35 minutes    Dorcas Carrow, MD [facility]
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
## 2022-12-26 NOTE — Plan of Care (Signed)

## 2022-12-26 NOTE — Progress Notes (Signed)
Occupational Therapy Note  Pt seen to check tolerance of R palm guard. Pt appears to be tolerating without issues.     12/26/22 1400  OT Visit Information  Last OT Received On 12/26/22  Assistance Needed +2  History of Present Illness Pt is 77 yo presenting to [facility] due to frequent falls. 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  Precautions  Precautions Fall  Precaution Comments previous R sided weakness from CVA; contracture RUE, especially R hand  Required Braces or Orthoses Splint/Cast  Splint/Cast R  palm guard  Pain Assessment  Pain Assessment Faces  Faces Pain Scale 4  Pain Location R and with ROM  Pain Descriptors / Indicators Grimacing  Pain Intervention(s) Limited activity within patient's tolerance  Cognition  Arousal Alert  Upper Extremity Assessment  RUE Deficits / Details flexor contracture  Transfers  General transfer comment not addressed this session  General Comments  General comments (skin integrity, edema, etc.) R hand hygiene completed prior to donning R palm guard  OT - End of Session  Activity Tolerance Patient tolerated treatment well  Patient left in bed;with call bell/phone within reach;with bed alarm set  Nurse Communication Other (comment) (use of palm guard)  OT Assessment/Plan  OT Visit Diagnosis Other abnormalities of gait and mobility (R26.89);Repeated falls (R29.6);Muscle weakness (generalized) (M62.81);Hemiplegia and hemiparesis  Hemiplegia - Right/Left Right  Hemiplegia - dominant/non-dominant Dominant  Hemiplegia - caused by Cerebral infarction  OT Frequency (ACUTE ONLY) Min 1X/week  Follow Up Recommendations Skilled nursing-short term rehab (<3 hours/day)  Patient can return home with the following Two people to help with walking and/or transfers;Two people to help with bathing/dressing/bathroom  OT Equipment None recommended by OT  AM-PAC OT "6 Clicks" Daily Activity Outcome Measure (Version 2)  Help from  another person eating meals? 3  Help from another person taking care of personal grooming? 3  Help from another person toileting, which includes using toliet, bedpan, or urinal? 1  Help from another person bathing (including washing, rinsing, drying)? 2  Help from another person to put on and taking off regular upper body clothing? 2  Help from another person to put on and taking off regular lower body clothing? 1  6 Click Score 12  Progressive Mobility  What is the highest level of mobility based on the progressive mobility assessment? Level 1 (Bedfast) - Unable to balance while sitting on edge of bed  Mobility Referral No  OT Goal Progression  Progress towards OT goals Progressing toward goals  Acute Rehab OT Goals  Patient Stated Goal none stated  OT Goal Formulation Patient unable to participate in goal setting  Time For Goal Achievement 01/04/23  Potential to Achieve Goals Fair  ADL Goals  Pt Will Perform Upper Body Bathing with min assist;sitting;bed level  Additional ADL Goal #1 tolerate R palm guard to reduce MASD adn improve skin integrity  Additional ADL Goal #2 Bed mobility with mod A in preparation for ADL tasks  OT Time Calculation  OT Start Time (ACUTE ONLY) 1313  OT Stop Time (ACUTE ONLY) 1324  OT Time Calculation (min) 11 min  OT General Charges  $OT Visit 1 Visit  OT Treatments  $Orthotics/Prosthetics Check 8-22 mins   Luisa Dago, OT/L   Acute OT Clinical Specialist Acute Rehabilitation Services Pager [phone] Office [phone]

## 2022-12-27 DIAGNOSIS — M4692 Unspecified inflammatory spondylopathy, cervical region: Secondary | ICD-10-CM | POA: Diagnosis not present

## 2022-12-27 DIAGNOSIS — M6281 Muscle weakness (generalized): Secondary | ICD-10-CM | POA: Diagnosis not present

## 2022-12-27 DIAGNOSIS — I10 Essential (primary) hypertension: Secondary | ICD-10-CM

## 2022-12-27 DIAGNOSIS — Z7189 Other specified counseling: Secondary | ICD-10-CM | POA: Diagnosis not present

## 2022-12-27 DIAGNOSIS — K250 Acute gastric ulcer with hemorrhage: Secondary | ICD-10-CM | POA: Diagnosis not present

## 2022-12-27 DIAGNOSIS — Z9012 Acquired absence of left breast and nipple: Secondary | ICD-10-CM | POA: Diagnosis not present

## 2022-12-27 DIAGNOSIS — E119 Type 2 diabetes mellitus without complications: Secondary | ICD-10-CM | POA: Diagnosis not present

## 2022-12-27 DIAGNOSIS — J432 Centrilobular emphysema: Secondary | ICD-10-CM | POA: Diagnosis not present

## 2022-12-27 DIAGNOSIS — F5102 Adjustment insomnia: Secondary | ICD-10-CM | POA: Diagnosis not present

## 2022-12-27 DIAGNOSIS — Z483 Aftercare following surgery for neoplasm: Secondary | ICD-10-CM | POA: Diagnosis not present

## 2022-12-27 DIAGNOSIS — F341 Dysthymic disorder: Secondary | ICD-10-CM | POA: Diagnosis not present

## 2022-12-27 DIAGNOSIS — H1012 Acute atopic conjunctivitis, left eye: Secondary | ICD-10-CM | POA: Diagnosis not present

## 2022-12-27 DIAGNOSIS — R630 Anorexia: Secondary | ICD-10-CM | POA: Diagnosis not present

## 2022-12-27 DIAGNOSIS — R5383 Other fatigue: Secondary | ICD-10-CM | POA: Diagnosis not present

## 2022-12-27 DIAGNOSIS — K219 Gastro-esophageal reflux disease without esophagitis: Secondary | ICD-10-CM | POA: Diagnosis not present

## 2022-12-27 DIAGNOSIS — M79631 Pain in right forearm: Secondary | ICD-10-CM | POA: Diagnosis not present

## 2022-12-27 DIAGNOSIS — J99 Respiratory disorders in diseases classified elsewhere: Secondary | ICD-10-CM | POA: Diagnosis not present

## 2022-12-27 DIAGNOSIS — R1311 Dysphagia, oral phase: Secondary | ICD-10-CM | POA: Diagnosis not present

## 2022-12-27 DIAGNOSIS — E785 Hyperlipidemia, unspecified: Secondary | ICD-10-CM | POA: Diagnosis not present

## 2022-12-27 DIAGNOSIS — Z7902 Long term (current) use of antithrombotics/antiplatelets: Secondary | ICD-10-CM | POA: Diagnosis not present

## 2022-12-27 DIAGNOSIS — R41841 Cognitive communication deficit: Secondary | ICD-10-CM | POA: Diagnosis not present

## 2022-12-27 DIAGNOSIS — Z23 Encounter for immunization: Secondary | ICD-10-CM | POA: Diagnosis not present

## 2022-12-27 DIAGNOSIS — K5900 Constipation, unspecified: Secondary | ICD-10-CM | POA: Diagnosis not present

## 2022-12-27 DIAGNOSIS — Z87891 Personal history of nicotine dependence: Secondary | ICD-10-CM | POA: Diagnosis not present

## 2022-12-27 DIAGNOSIS — R2689 Other abnormalities of gait and mobility: Secondary | ICD-10-CM | POA: Diagnosis not present

## 2022-12-27 DIAGNOSIS — Z8669 Personal history of other diseases of the nervous system and sense organs: Secondary | ICD-10-CM | POA: Diagnosis not present

## 2022-12-27 DIAGNOSIS — Z743 Need for continuous supervision: Secondary | ICD-10-CM | POA: Diagnosis not present

## 2022-12-27 DIAGNOSIS — I69320 Aphasia following cerebral infarction: Secondary | ICD-10-CM

## 2022-12-27 DIAGNOSIS — K449 Diaphragmatic hernia without obstruction or gangrene: Secondary | ICD-10-CM | POA: Diagnosis not present

## 2022-12-27 DIAGNOSIS — M25562 Pain in left knee: Secondary | ICD-10-CM | POA: Diagnosis not present

## 2022-12-27 DIAGNOSIS — F0390 Unspecified dementia without behavioral disturbance: Secondary | ICD-10-CM | POA: Diagnosis not present

## 2022-12-27 DIAGNOSIS — I69351 Hemiplegia and hemiparesis following cerebral infarction affecting right dominant side: Secondary | ICD-10-CM | POA: Diagnosis not present

## 2022-12-27 DIAGNOSIS — Z8673 Personal history of transient ischemic attack (TIA), and cerebral infarction without residual deficits: Secondary | ICD-10-CM | POA: Diagnosis not present

## 2022-12-27 DIAGNOSIS — W19XXXA Unspecified fall, initial encounter: Secondary | ICD-10-CM

## 2022-12-27 DIAGNOSIS — H109 Unspecified conjunctivitis: Secondary | ICD-10-CM | POA: Diagnosis not present

## 2022-12-27 DIAGNOSIS — J449 Chronic obstructive pulmonary disease, unspecified: Secondary | ICD-10-CM | POA: Diagnosis not present

## 2022-12-27 DIAGNOSIS — Z170 Estrogen receptor positive status [ER+]: Secondary | ICD-10-CM | POA: Diagnosis not present

## 2022-12-27 DIAGNOSIS — R296 Repeated falls: Secondary | ICD-10-CM | POA: Diagnosis not present

## 2022-12-27 DIAGNOSIS — H02409 Unspecified ptosis of unspecified eyelid: Secondary | ICD-10-CM | POA: Diagnosis not present

## 2022-12-27 DIAGNOSIS — Z9189 Other specified personal risk factors, not elsewhere classified: Secondary | ICD-10-CM | POA: Diagnosis not present

## 2022-12-27 DIAGNOSIS — F331 Major depressive disorder, recurrent, moderate: Secondary | ICD-10-CM | POA: Diagnosis not present

## 2022-12-27 DIAGNOSIS — M6259 Muscle wasting and atrophy, not elsewhere classified, multiple sites: Secondary | ICD-10-CM | POA: Diagnosis not present

## 2022-12-27 DIAGNOSIS — R531 Weakness: Secondary | ICD-10-CM | POA: Diagnosis not present

## 2022-12-27 DIAGNOSIS — F32A Depression, unspecified: Secondary | ICD-10-CM | POA: Diagnosis not present

## 2022-12-27 DIAGNOSIS — M170 Bilateral primary osteoarthritis of knee: Secondary | ICD-10-CM | POA: Diagnosis not present

## 2022-12-27 DIAGNOSIS — G459 Transient cerebral ischemic attack, unspecified: Secondary | ICD-10-CM | POA: Diagnosis not present

## 2022-12-27 DIAGNOSIS — E1165 Type 2 diabetes mellitus with hyperglycemia: Secondary | ICD-10-CM | POA: Diagnosis not present

## 2022-12-27 DIAGNOSIS — H02004 Unspecified entropion of left upper eyelid: Secondary | ICD-10-CM | POA: Diagnosis not present

## 2022-12-27 DIAGNOSIS — E1169 Type 2 diabetes mellitus with other specified complication: Secondary | ICD-10-CM | POA: Diagnosis not present

## 2022-12-27 DIAGNOSIS — Z6832 Body mass index (BMI) 32.0-32.9, adult: Secondary | ICD-10-CM | POA: Diagnosis not present

## 2022-12-27 DIAGNOSIS — M6250 Muscle wasting and atrophy, not elsewhere classified, unspecified site: Secondary | ICD-10-CM | POA: Diagnosis not present

## 2022-12-27 DIAGNOSIS — D649 Anemia, unspecified: Secondary | ICD-10-CM | POA: Diagnosis not present

## 2022-12-27 DIAGNOSIS — C50919 Malignant neoplasm of unspecified site of unspecified female breast: Secondary | ICD-10-CM | POA: Diagnosis not present

## 2022-12-27 DIAGNOSIS — C50111 Malignant neoplasm of central portion of right female breast: Secondary | ICD-10-CM | POA: Diagnosis not present

## 2022-12-27 DIAGNOSIS — Z9181 History of falling: Secondary | ICD-10-CM | POA: Diagnosis not present

## 2022-12-27 DIAGNOSIS — Z87440 Personal history of urinary (tract) infections: Secondary | ICD-10-CM | POA: Diagnosis not present

## 2022-12-27 DIAGNOSIS — I2510 Atherosclerotic heart disease of native coronary artery without angina pectoris: Secondary | ICD-10-CM | POA: Diagnosis not present

## 2022-12-27 MED ORDER — POLYETHYLENE GLYCOL 3350 17 G PO PACK: 17.0000 g | PACK | Freq: Every day | ORAL | Status: DC | PRN

## 2022-12-27 MED ORDER — ENSURE ENLIVE PO LIQD: 237.0000 mL | Freq: Two times a day (BID) | ORAL | Status: DC

## 2022-12-27 MED ORDER — PANTOPRAZOLE SODIUM 40 MG PO TBEC: 40.0000 mg | DELAYED_RELEASE_TABLET | Freq: Two times a day (BID) | ORAL | Status: AC

## 2022-12-27 NOTE — Progress Notes (Signed)
Pt is to be discharge to [facility] for continuing care.  She is to be transported via [place].  Report was called to Burna Mortimer, Charity fundraiser at [facility] at [employer].  Reviewed AVS and POC with Burna Mortimer, RN.  Informed her of prescriptions that needed to be fill.  Also reviewed AVS with pt's spouse and made him aware of discharge to [facility] as well as mode of transport.  Assisted pt in getting dressed and gathering her belongings.  Removed PIV which was CDI and free from S/Sx of infection.  Pt was transported to [facility] via gurney accompanied by PTAR.  Pt discharged in stable condition.

## 2022-12-27 NOTE — Plan of Care (Signed)
Problem: Education: Goal: Knowledge of General Education information will improve Description: Including pain rating scale, medication(s)/side effects and non-pharmacologic comfort measures Outcome: Progressing Pt is confused and is A/O to self only.  She does not understands why she was admitted into the hospital.  Pt was having multiple falls at home.  She is currently awaiting discharge to [facility].  PTAR is being arrange to pick pt up for d/c.   Problem: Clinical Measurements: Goal: Will remain free from infection Outcome: Progressing S/Sx of infection monitored and assessed q-shift.  Pt has remained afebrile thus far.     Problem: Clinical Measurements: Goal: Respiratory complications will improve Outcome: Progressing Respiratory status monitored and assessed q-shift.  Pt is on room air with PO2 at 95-96% and respiration rate of 16-18 breaths per minute.  Pt has not endorsed c/o SOB or DOE.    Problem: Clinical Measurements: Goal: Cardiovascular complication will be avoided Outcome: Progressing Pt's DBP remains on the low end this shift but all other VS WNL.   Problem: Activity: Goal: Risk for activity intolerance will decrease Outcome: Progressing Pt is dependent of all her ADLs.  She cannot get up OOB.  Per pt's husband she is bed/ wheelchair bound at home.     Problem: Nutrition: Goal: Adequate nutrition will be maintained Outcome: Progressing Pt is on a soft diet per MD's orders and can tolerate it w/o s/sx of abdominal pain/ distention or n/v.    Problem: Safety: Goal: Ability to remain free from injury will improve Outcome: Progressing Pt has remained free from falls thus far.  Instructed pt to utilize RN call light for assistance.  Hourly rounds performed.  Bed alarm implemented to keep pt safe from falls.  Settings activated to third most sensitive mode.  Bed in lowest position, locked with two upper side rails engaged.  Belongings and call light within reach.     Problem: Skin Integrity: Goal: Risk for impaired skin integrity will decrease Outcome: Progressing Skin integrity monitored and assessed q-shift.  Pt is on q2 hourly turns to prevent further skin impairment.  Tubes and drains assessed for device related pressures sores.  Pt is incontinent of both bowel and bladder.  She is checked q2 hours for bowel incontinence.  Perineal care given promptly after each episode.  Pt has an external female catheter (purewick) in place to capture her urine.  Management of external female catheter (purewick) performed per [facility] policy, procedure and guidelines.

## 2022-12-27 NOTE — Discharge Summary (Signed)
Physician Discharge Summary   Patient: Brandy Paul MRN: [id number] DOB: 1945/12/07  Admit date:     12/20/2022  Discharge date: 12/27/22  Discharge Physician: Arnetha Courser   PCP: Alysia Penna, MD   Recommendations at discharge:  Please obtain CBC and BMP in 1 week Follow-up.  Primary care provider over the next week Follow-up with gastroenterology  Discharge Diagnoses: Principal Problem:   Symptomatic anemia Active Problems:   CVA, old, aphasia   T2DM (type 2 diabetes mellitus) (HCC)   Anemia   Essential hypertension   COPD (chronic obstructive pulmonary disease) (HCC)   Malignant neoplasm of central portion of right breast in female, estrogen receptor positive [facility])   [facility] Course: 77 y.o.f w/ prior cva with residual right sided weakness, copd, anemia, ghtn, dm, breast cancer, who presented with complaint of frequent falls. Husband reports decreased PO as of late, frequent falls. Has bruising to head and knees. Currently complaining of b/l knee pain, says this is chronic. No melena or hematochezia. Husband brought her in because of the frequent falls. Denies new focal neurologic problems. EGD in 2021 showed esophagitis, patient doesn't think she's had endoscopy since.  No vomiting or diarrhea, no chest pain, no fevers or dysuria.  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  Her diet is being advanced.  At this time patient remains hemodynamically stable tolerating diet hemoglobin is stable and 7-8 g range. She will continue iron and B12 supplement and need to have a follow-up with gastroenterology as outpatient.  Patient has an history of CVA with residual right-sided  weakness.Underwent MRI brain with old bilateral basal ganglia and cerebellar infarcts and microvascular ischemia.  No acute abnormality.  Initially home Plavix was held and later it was resumed.  She will continue her Zetia and atorvastatin.  PT and OT evaluated her and recommending SNF for further rehab.  She is being discharged.  Patient has a history of chronic bilateral knee pain, imaging was obtained in the ED which shows degenerative arthritis.  She will continue with pain control.  Patient has an history of frequent UTI for which she will continue on trimethoprim.  Patient current medications and dietary supplements and need to have a close follow-up with her providers for further recommendations.   Consultants: Gastroenterology Procedures performed: None Disposition: Skilled nursing facility Diet recommendation:  Discharge Diet Orders (From admission, onward)     Start     Ordered   12/27/22 0000  Diet - low sodium heart healthy        12/27/22 1040           Mechanically soft DISCHARGE MEDICATION: Allergies as of 12/27/2022       Reactions   Ceclor [cefaclor] Other (See Comments)   REACTION: "SEVERE HEADACHE"        Medication List     STOP taking these medications    oxyCODONE 5 MG immediate release tablet Commonly known as: Oxy IR/ROXICODONE       TAKE these medications    acetaminophen 500 MG tablet Commonly known as: TYLENOL Take 500 mg by mouth daily as needed for mild pain.   albuterol 108 (90 Base) MCG/ACT inhaler Commonly known as: VENTOLIN HFA Inhale 1  puff into the lungs every evening.   anastrozole 1 MG tablet Commonly known as: ARIMIDEX Take 1 tablet (1 mg total) by mouth daily. What changed: when to take this   atorvastatin 40 MG tablet Commonly known as: LIPITOR Take 40 mg by mouth at bedtime.   buPROPion 150 MG 12 hr tablet Commonly known as: WELLBUTRIN SR Take 150 mg by mouth daily.   calcium carbonate 1500 (600 Ca) MG Tabs  tablet Commonly known as: OSCAL Take 600 mg of elemental calcium by mouth daily.   clopidogrel 75 MG tablet Commonly known as: PLAVIX Take 75 mg by mouth daily.   cyanocobalamin 1000 MCG tablet Take 1 tablet (1,000 mcg total) by mouth daily.   ezetimibe 10 MG tablet Commonly known as: ZETIA Take 10 mg by mouth at bedtime.   feeding supplement Liqd Take 237 mLs by mouth 2 (two) times daily between meals.   ferrous sulfate 325 (65 FE) MG tablet Take 1 tablet (325 mg total) by mouth daily with breakfast.   isosorbide mononitrate 60 MG 24 hr tablet Commonly known as: IMDUR Take 1 tablet (60 mg total) by mouth daily. Please make overdue appt with Dr. Katrinka Blazing before anymore refills. Thank you  3rd and final attempt   nitroGLYCERIN 0.4 MG SL tablet Commonly known as: NITROSTAT Place 1 tablet (0.4 mg total) under the tongue every 5 (five) minutes as needed for chest pain (Call 911 at 3rd dose within 15 minutes).   oxybutynin 10 MG 24 hr tablet Commonly known as: DITROPAN-XL Take 1 tablet by mouth daily.   pantoprazole 40 MG tablet Commonly known as: PROTONIX Take 1 tablet (40 mg total) by mouth 2 (two) times daily. What changed: when to take this   polyethylene glycol 17 g packet Commonly known as: MIRALAX / GLYCOLAX Take 17 g by mouth daily as needed for moderate constipation.   sertraline 100 MG tablet Commonly known as: ZOLOFT Take 100 mg by mouth at bedtime.   tiotropium 18 MCG inhalation capsule Commonly known as: SPIRIVA Place 18 mcg into inhaler and inhale every evening.   trimethoprim 100 MG tablet Commonly known as: TRIMPEX Take 100 mg by mouth daily.        Follow-up Information     Alysia Penna, MD. Schedule an appointment as soon as possible for a visit in 1 week(s).   Specialty: Internal Medicine Contact information: [address] [place] [address] [phone]                Discharge Exam: Ceasar Mons Weights   12/20/22 0951   Weight: 90.7 kg   General.  Frail and overweight elderly lady, in no acute distress. Pulmonary.  Lungs clear bilaterally, normal respiratory effort. CV.  Regular rate and rhythm, no JVD, rub or murmur. Abdomen.  Soft, nontender, nondistended, BS positive. CNS.  Alert and oriented .  Extremities.  No edema, no cyanosis, pulses intact and symmetrical. Psychiatry.  Judgment and insight appears normal.   Condition at discharge: stable  The results of significant diagnostics from this hospitalization (including imaging, microbiology, ancillary and laboratory) are listed below for reference.   Imaging Studies: MR BRAIN WO CONTRAST  Result Date: 12/20/2022 CLINICAL DATA:  Fall EXAM: MRI HEAD WITHOUT CONTRAST TECHNIQUE: Multiplanar, multiecho pulse sequences of the brain and surrounding structures were obtained without intravenous contrast. COMPARISON:  09/18/2017 FINDINGS: Brain: No acute infarct, mass effect or extra-axial collection. No chronic microhemorrhage or siderosis. There is multifocal hyperintense T2-weighted signal within the white matter. Generalized  volume loss. Old bilateral basal ganglia and cerebellar infarcts. The midline structures are normal. Vascular: Normal flow voids. Skull and upper cervical spine: Normal marrow signal. Sinuses/Orbits: Negative. Other: None. IMPRESSION: 1. No acute intracranial abnormality. 2. Old bilateral basal ganglia and cerebellar infarcts and findings of chronic microvascular ischemia. Electronically Signed   By: Deatra Robinson M.D.   On: 12/20/2022 23:25   DG Knee 1-2 Views Left  Result Date: 12/20/2022 CLINICAL DATA:  Left and right knee pain and bruising EXAM: LEFT KNEE - 1-2 VIEW; RIGHT KNEE - 1-2 VIEW COMPARISON:  Left knee radiographs 05/10/2021 FINDINGS: Right: No acute fracture or dislocation. Chondrocalcinosis in the lateral and medial compartments. Degenerative arthritis right knee with moderate medial and lateral compartment narrowing. Small  knee joint effusion. Patellar enthesophytes. Soft tissues are unremarkable. Left: No acute fracture or dislocation. Degenerative arthritis left knee with advanced lateral compartment narrowing. Trace knee joint effusion. Superior patellar enthesophyte. Soft tissues are unremarkable. IMPRESSION: 1. No acute fracture or dislocation. 2. Degenerative arthritis of the knees with advanced lateral compartment narrowing on the left and moderate medial and lateral compartment narrowing on the right. Electronically Signed   By: Minerva Fester M.D.   On: 12/20/2022 16:12   DG Knee 1-2 Views Right  Result Date: 12/20/2022 CLINICAL DATA:  Left and right knee pain and bruising EXAM: LEFT KNEE - 1-2 VIEW; RIGHT KNEE - 1-2 VIEW COMPARISON:  Left knee radiographs 05/10/2021 FINDINGS: Right: No acute fracture or dislocation. Chondrocalcinosis in the lateral and medial compartments. Degenerative arthritis right knee with moderate medial and lateral compartment narrowing. Small knee joint effusion. Patellar enthesophytes. Soft tissues are unremarkable. Left: No acute fracture or dislocation. Degenerative arthritis left knee with advanced lateral compartment narrowing. Trace knee joint effusion. Superior patellar enthesophyte. Soft tissues are unremarkable. IMPRESSION: 1. No acute fracture or dislocation. 2. Degenerative arthritis of the knees with advanced lateral compartment narrowing on the left and moderate medial and lateral compartment narrowing on the right. Electronically Signed   By: Minerva Fester M.D.   On: 12/20/2022 16:12   CT Head Wo Contrast  Result Date: 12/20/2022 CLINICAL DATA:  Fall with trauma to the head, face and neck. EXAM: CT HEAD WITHOUT CONTRAST CT CERVICAL SPINE WITHOUT CONTRAST TECHNIQUE: Multidetector CT imaging of the head and cervical spine was performed following the standard protocol without intravenous contrast. Multiplanar CT image reconstructions of the cervical spine were also generated.  RADIATION DOSE REDUCTION: This exam was performed according to the departmental dose-optimization program which includes automated exposure control, adjustment of the mA and/or kV according to patient size and/or use of iterative reconstruction technique. COMPARISON:  05/10/2021 FINDINGS: CT HEAD FINDINGS Brain: No acute finding. Old small vessel ischemic changes affect the pons. Numerous old small vessel cerebellar infarctions. Extensive old infarctions in the basal ganglia and radiating white matter tracts, more extensive on the left than the right. Old left frontal cortical and subcortical infarction. No sign of acute infarction, mass lesion, hemorrhage, hydrocephalus or extra-axial collection. Vascular: There is atherosclerotic calcification of the major vessels at the base of the brain. Skull: Negative Sinuses/Orbits: Clear/normal Other: None CT CERVICAL SPINE FINDINGS Alignment: Straightening of the normal cervical lordosis. No traumatic malalignment. Skull base and vertebrae: No fracture or focal bone lesion. Soft tissues and spinal canal: Negative Disc levels: The foramen magnum is widely patent. There is ordinary mild osteoarthritis of the C1-2 articulation but no encroachment upon the neural structures. C2-3: Normal interspace. C3-4 through C6-7: Spondylosis with disc space narrowing  and endplate osteophytes. Mild bilateral bony foraminal stenosis. C7-T1: Normal interspace. Upper chest: Negative Other: None IMPRESSION: HEAD CT: No acute or traumatic finding. Extensive old ischemic changes as outlined above. CERVICAL SPINE CT: No acute or traumatic finding. Degenerative spondylosis C3-4 through C6-7. Electronically Signed   By: Paulina Fusi M.D.   On: 12/20/2022 12:38   CT Cervical Spine Wo Contrast  Result Date: 12/20/2022 CLINICAL DATA:  Fall with trauma to the head, face and neck. EXAM: CT HEAD WITHOUT CONTRAST CT CERVICAL SPINE WITHOUT CONTRAST TECHNIQUE: Multidetector CT imaging of the head and  cervical spine was performed following the standard protocol without intravenous contrast. Multiplanar CT image reconstructions of the cervical spine were also generated. RADIATION DOSE REDUCTION: This exam was performed according to the departmental dose-optimization program which includes automated exposure control, adjustment of the mA and/or kV according to patient size and/or use of iterative reconstruction technique. COMPARISON:  05/10/2021 FINDINGS: CT HEAD FINDINGS Brain: No acute finding. Old small vessel ischemic changes affect the pons. Numerous old small vessel cerebellar infarctions. Extensive old infarctions in the basal ganglia and radiating white matter tracts, more extensive on the left than the right. Old left frontal cortical and subcortical infarction. No sign of acute infarction, mass lesion, hemorrhage, hydrocephalus or extra-axial collection. Vascular: There is atherosclerotic calcification of the major vessels at the base of the brain. Skull: Negative Sinuses/Orbits: Clear/normal Other: None CT CERVICAL SPINE FINDINGS Alignment: Straightening of the normal cervical lordosis. No traumatic malalignment. Skull base and vertebrae: No fracture or focal bone lesion. Soft tissues and spinal canal: Negative Disc levels: The foramen magnum is widely patent. There is ordinary mild osteoarthritis of the C1-2 articulation but no encroachment upon the neural structures. C2-3: Normal interspace. C3-4 through C6-7: Spondylosis with disc space narrowing and endplate osteophytes. Mild bilateral bony foraminal stenosis. C7-T1: Normal interspace. Upper chest: Negative Other: None IMPRESSION: HEAD CT: No acute or traumatic finding. Extensive old ischemic changes as outlined above. CERVICAL SPINE CT: No acute or traumatic finding. Degenerative spondylosis C3-4 through C6-7. Electronically Signed   By: Paulina Fusi M.D.   On: 12/20/2022 12:38    Microbiology: Results for orders placed or performed during the  hospital encounter of 09/30/20  Resp Panel by RT-PCR (Flu A&B, Covid) Nasopharyngeal Swab     Status: None   Collection Time: 09/30/20  6:01 PM   Specimen: Nasopharyngeal Swab; Nasopharyngeal(NP) swabs in vial transport medium  Result Value Ref Range Status   SARS Coronavirus 2 by RT PCR NEGATIVE NEGATIVE Final    Comment: (NOTE) SARS-CoV-2 target nucleic acids are NOT DETECTED.  The SARS-CoV-2 RNA is generally detectable in upper respiratory specimens during the acute phase of infection. The lowest concentration of SARS-CoV-2 viral copies this assay can detect is 138 copies/mL. A negative result does not preclude SARS-Cov-2 infection and should not be used as the sole basis for treatment or other patient management decisions. A negative result may occur with  improper specimen collection/handling, submission of specimen other than nasopharyngeal swab, presence of viral mutation(s) within the areas targeted by this assay, and inadequate number of viral copies(<138 copies/mL). A negative result must be combined with clinical observations, patient history, and epidemiological information. The expected result is Negative.  Fact Sheet for Patients:  BloggerCourse.com  Fact Sheet for Healthcare Providers:  SeriousBroker.it  This test is no t yet approved or cleared by the [place] FDA and  has been authorized for detection and/or diagnosis of SARS-CoV-2 by FDA under an Emergency  Use Authorization (EUA). This EUA will remain  in effect (meaning this test can be used) for the duration of the COVID-19 declaration under Section 564(b)(1) of the Act, 21 U.S.C.section 360bbb-3(b)(1), unless the authorization is terminated  or revoked sooner.       Influenza A by PCR NEGATIVE NEGATIVE Final   Influenza B by PCR NEGATIVE NEGATIVE Final    Comment: (NOTE) The Xpert Xpress SARS-CoV-2/FLU/RSV plus assay is intended as an aid in the  diagnosis of influenza from Nasopharyngeal swab specimens and should not be used as a sole basis for treatment. Nasal washings and aspirates are unacceptable for Xpert Xpress SARS-CoV-2/FLU/RSV testing.  Fact Sheet for Patients: BloggerCourse.com  Fact Sheet for Healthcare Providers: SeriousBroker.it  This test is not yet approved or cleared by the [place] FDA and has been authorized for detection and/or diagnosis of SARS-CoV-2 by FDA under an Emergency Use Authorization (EUA). This EUA will remain in effect (meaning this test can be used) for the duration of the COVID-19 declaration under Section 564(b)(1) of the Act, 21 U.S.C. section 360bbb-3(b)(1), unless the authorization is terminated or revoked.  Performed at [facility], [address], [place], [place] [address]     Labs: CBC: Recent Labs  Lab 12/22/22 [phone] 12/22/22 1028 12/23/22 1233 12/24/22 0814 12/25/22 0834 12/26/22 0453  WBC 6.4  --  5.5 5.7 5.3 5.4  HGB 7.5* 7.6* 7.8* 7.9* 7.8* 7.9*  HCT 25.2* 25.9* 26.7* 26.3* 26.2* 26.4*  MCV 79.2*  --  80.4 81.7 80.4 82.5  PLT 237  --  249 243 251 241   Basic Metabolic Panel: Recent Labs  Lab 12/22/22 0619 12/23/22 1233 12/24/22 0814 12/25/22 0834 12/26/22 0453  NA 137 139 137 139 138  K 3.6 3.7 3.6 3.5 3.7  CL 106 109 108 109 107  CO2 23 24 22 24 25   GLUCOSE 94 100* 90 87 98  BUN 16 12 11 11 13   CREATININE 1.14* 1.09* 1.00 0.99 1.09*  CALCIUM 7.2* 7.6* 7.6* 7.8* 7.8*   Liver Function Tests: No results for input(s): "AST", "ALT", "ALKPHOS", "BILITOT", "PROT", "ALBUMIN" in the last 168 hours. CBG: No results for input(s): "GLUCAP" in the last 168 hours.  Discharge time spent: greater than 30 minutes.  This record has been created using Conservation officer, historic buildings. Errors have been sought and corrected,but may not always be located. Such creation errors do not reflect on  the standard of care.   Signed: Arnetha Courser, MD [facility] 12/27/2022

## 2022-12-27 NOTE — TOC Transition Note (Signed)
Transition of Care [facility]) - CM/SW Discharge Note   Patient Details  Name: Brandy Paul MRN: [id number] Date of Birth: 11-Mar-1946  [facility] CM/SW Contact:  Zarielle Cea A [place], Theresia Majors Phone Number: 12/27/2022, 1:28 PM   Clinical Narrative:     Patient will DC to: [facility]  Anticipated DC date: 12/27/22  Family notified: Encarnacion Slates  Transport by: Sharin Mons      Per MD patient ready for DC to [facility] . RN, patient, patient's family, and facility notified of DC. Discharge Summary and FL2 sent to facility. RN to call report prior to discharge ( 408p, [phone]). DC packet on chart. Ambulance transport requested for patient.     CSW will sign off for now as social work intervention is no longer needed. Please consult [place] again if new needs arise.   Final next level of care: Skilled Nursing Facility Barriers to Discharge: Barriers Resolved   Patient Goals and CMS Choice      Discharge Placement                Patient chooses bed at: [facility] Patient to be transferred to facility by: [facility] Name of family member notified: Martine Bleecker Patient and family notified of of transfer: 12/27/22  Discharge Plan and Services Additional resources added to the After Visit Summary for                                       Social Determinants of Health (SDOH) Interventions SDOH Screenings   Food Insecurity: No Food Insecurity (12/20/2022)  Housing: Low Risk  (12/20/2022)  Transportation Needs: No Transportation Needs (12/20/2022)  Utilities: Not At Risk (12/20/2022)  Tobacco Use: Medium Risk (12/20/2022)     Readmission Risk Interventions     No data to display

## 2022-12-28 DIAGNOSIS — D649 Anemia, unspecified: Secondary | ICD-10-CM | POA: Diagnosis not present

## 2022-12-28 DIAGNOSIS — E1165 Type 2 diabetes mellitus with hyperglycemia: Secondary | ICD-10-CM | POA: Diagnosis not present

## 2022-12-28 DIAGNOSIS — I69351 Hemiplegia and hemiparesis following cerebral infarction affecting right dominant side: Secondary | ICD-10-CM | POA: Diagnosis not present

## 2022-12-28 DIAGNOSIS — M6250 Muscle wasting and atrophy, not elsewhere classified, unspecified site: Secondary | ICD-10-CM | POA: Diagnosis not present

## 2022-12-28 DIAGNOSIS — J449 Chronic obstructive pulmonary disease, unspecified: Secondary | ICD-10-CM | POA: Diagnosis not present

## 2022-12-28 DIAGNOSIS — Z7902 Long term (current) use of antithrombotics/antiplatelets: Secondary | ICD-10-CM | POA: Diagnosis not present

## 2022-12-28 DIAGNOSIS — K250 Acute gastric ulcer with hemorrhage: Secondary | ICD-10-CM | POA: Diagnosis not present

## 2022-12-28 DIAGNOSIS — K219 Gastro-esophageal reflux disease without esophagitis: Secondary | ICD-10-CM | POA: Diagnosis not present

## 2022-12-28 DIAGNOSIS — Z87891 Personal history of nicotine dependence: Secondary | ICD-10-CM | POA: Diagnosis not present

## 2022-12-28 DIAGNOSIS — I2510 Atherosclerotic heart disease of native coronary artery without angina pectoris: Secondary | ICD-10-CM | POA: Diagnosis not present

## 2022-12-28 DIAGNOSIS — I69320 Aphasia following cerebral infarction: Secondary | ICD-10-CM | POA: Diagnosis not present

## 2022-12-28 DIAGNOSIS — K449 Diaphragmatic hernia without obstruction or gangrene: Secondary | ICD-10-CM | POA: Diagnosis not present

## 2022-12-28 DIAGNOSIS — Z483 Aftercare following surgery for neoplasm: Secondary | ICD-10-CM | POA: Diagnosis not present

## 2022-12-28 DIAGNOSIS — Z87440 Personal history of urinary (tract) infections: Secondary | ICD-10-CM | POA: Diagnosis not present

## 2022-12-28 DIAGNOSIS — Z9189 Other specified personal risk factors, not elsewhere classified: Secondary | ICD-10-CM | POA: Diagnosis not present

## 2022-12-28 DIAGNOSIS — F341 Dysthymic disorder: Secondary | ICD-10-CM | POA: Diagnosis not present

## 2022-12-28 DIAGNOSIS — R296 Repeated falls: Secondary | ICD-10-CM | POA: Diagnosis not present

## 2022-12-28 DIAGNOSIS — E1169 Type 2 diabetes mellitus with other specified complication: Secondary | ICD-10-CM | POA: Diagnosis not present

## 2022-12-28 DIAGNOSIS — E785 Hyperlipidemia, unspecified: Secondary | ICD-10-CM | POA: Diagnosis not present

## 2022-12-28 DIAGNOSIS — Z9012 Acquired absence of left breast and nipple: Secondary | ICD-10-CM | POA: Diagnosis not present

## 2022-12-28 DIAGNOSIS — M170 Bilateral primary osteoarthritis of knee: Secondary | ICD-10-CM | POA: Diagnosis not present

## 2022-12-28 DIAGNOSIS — Z6832 Body mass index (BMI) 32.0-32.9, adult: Secondary | ICD-10-CM | POA: Diagnosis not present

## 2023-01-01 DIAGNOSIS — F331 Major depressive disorder, recurrent, moderate: Secondary | ICD-10-CM | POA: Diagnosis not present

## 2023-01-01 DIAGNOSIS — F0390 Unspecified dementia without behavioral disturbance: Secondary | ICD-10-CM | POA: Diagnosis not present

## 2023-01-01 DIAGNOSIS — D649 Anemia, unspecified: Secondary | ICD-10-CM | POA: Diagnosis not present

## 2023-01-01 DIAGNOSIS — H02004 Unspecified entropion of left upper eyelid: Secondary | ICD-10-CM | POA: Diagnosis not present

## 2023-01-01 DIAGNOSIS — F32A Depression, unspecified: Secondary | ICD-10-CM | POA: Diagnosis not present

## 2023-01-01 DIAGNOSIS — H02409 Unspecified ptosis of unspecified eyelid: Secondary | ICD-10-CM | POA: Diagnosis not present

## 2023-01-01 DIAGNOSIS — Z7189 Other specified counseling: Secondary | ICD-10-CM | POA: Diagnosis not present

## 2023-01-01 DIAGNOSIS — K250 Acute gastric ulcer with hemorrhage: Secondary | ICD-10-CM | POA: Diagnosis not present

## 2023-01-01 DIAGNOSIS — F5102 Adjustment insomnia: Secondary | ICD-10-CM | POA: Diagnosis not present

## 2023-01-01 DIAGNOSIS — H109 Unspecified conjunctivitis: Secondary | ICD-10-CM | POA: Diagnosis not present

## 2023-01-02 DIAGNOSIS — M79631 Pain in right forearm: Secondary | ICD-10-CM | POA: Diagnosis not present

## 2023-01-04 DIAGNOSIS — M6259 Muscle wasting and atrophy, not elsewhere classified, multiple sites: Secondary | ICD-10-CM | POA: Diagnosis not present

## 2023-01-04 DIAGNOSIS — M6281 Muscle weakness (generalized): Secondary | ICD-10-CM | POA: Diagnosis not present

## 2023-01-04 DIAGNOSIS — Z9181 History of falling: Secondary | ICD-10-CM | POA: Diagnosis not present

## 2023-01-04 DIAGNOSIS — I69320 Aphasia following cerebral infarction: Secondary | ICD-10-CM | POA: Diagnosis not present

## 2023-01-04 DIAGNOSIS — I69351 Hemiplegia and hemiparesis following cerebral infarction affecting right dominant side: Secondary | ICD-10-CM | POA: Diagnosis not present

## 2023-01-08 DIAGNOSIS — M6281 Muscle weakness (generalized): Secondary | ICD-10-CM | POA: Diagnosis not present

## 2023-01-08 DIAGNOSIS — I69320 Aphasia following cerebral infarction: Secondary | ICD-10-CM | POA: Diagnosis not present

## 2023-01-08 DIAGNOSIS — M6259 Muscle wasting and atrophy, not elsewhere classified, multiple sites: Secondary | ICD-10-CM | POA: Diagnosis not present

## 2023-01-08 DIAGNOSIS — I69351 Hemiplegia and hemiparesis following cerebral infarction affecting right dominant side: Secondary | ICD-10-CM | POA: Diagnosis not present

## 2023-01-08 DIAGNOSIS — Z9181 History of falling: Secondary | ICD-10-CM | POA: Diagnosis not present

## 2023-01-10 DIAGNOSIS — F32A Depression, unspecified: Secondary | ICD-10-CM | POA: Diagnosis not present

## 2023-01-10 DIAGNOSIS — K5900 Constipation, unspecified: Secondary | ICD-10-CM | POA: Diagnosis not present

## 2023-01-10 DIAGNOSIS — K250 Acute gastric ulcer with hemorrhage: Secondary | ICD-10-CM | POA: Diagnosis not present

## 2023-01-10 DIAGNOSIS — Z6832 Body mass index (BMI) 32.0-32.9, adult: Secondary | ICD-10-CM | POA: Diagnosis not present

## 2023-01-10 DIAGNOSIS — R630 Anorexia: Secondary | ICD-10-CM | POA: Diagnosis not present

## 2023-01-10 DIAGNOSIS — H02004 Unspecified entropion of left upper eyelid: Secondary | ICD-10-CM | POA: Diagnosis not present

## 2023-01-10 DIAGNOSIS — Z8669 Personal history of other diseases of the nervous system and sense organs: Secondary | ICD-10-CM | POA: Diagnosis not present

## 2023-01-10 DIAGNOSIS — K219 Gastro-esophageal reflux disease without esophagitis: Secondary | ICD-10-CM | POA: Diagnosis not present

## 2023-01-11 DIAGNOSIS — M6281 Muscle weakness (generalized): Secondary | ICD-10-CM | POA: Diagnosis not present

## 2023-01-11 DIAGNOSIS — M6259 Muscle wasting and atrophy, not elsewhere classified, multiple sites: Secondary | ICD-10-CM | POA: Diagnosis not present

## 2023-01-11 DIAGNOSIS — I69351 Hemiplegia and hemiparesis following cerebral infarction affecting right dominant side: Secondary | ICD-10-CM | POA: Diagnosis not present

## 2023-01-11 DIAGNOSIS — I69320 Aphasia following cerebral infarction: Secondary | ICD-10-CM | POA: Diagnosis not present

## 2023-01-11 DIAGNOSIS — Z9181 History of falling: Secondary | ICD-10-CM | POA: Diagnosis not present

## 2023-01-15 DIAGNOSIS — M6281 Muscle weakness (generalized): Secondary | ICD-10-CM | POA: Diagnosis not present

## 2023-01-15 DIAGNOSIS — M6259 Muscle wasting and atrophy, not elsewhere classified, multiple sites: Secondary | ICD-10-CM | POA: Diagnosis not present

## 2023-01-15 DIAGNOSIS — Z9181 History of falling: Secondary | ICD-10-CM | POA: Diagnosis not present

## 2023-01-15 DIAGNOSIS — I69320 Aphasia following cerebral infarction: Secondary | ICD-10-CM | POA: Diagnosis not present

## 2023-01-15 DIAGNOSIS — I69351 Hemiplegia and hemiparesis following cerebral infarction affecting right dominant side: Secondary | ICD-10-CM | POA: Diagnosis not present

## 2023-01-17 DIAGNOSIS — M6281 Muscle weakness (generalized): Secondary | ICD-10-CM | POA: Diagnosis not present

## 2023-01-17 DIAGNOSIS — Z9181 History of falling: Secondary | ICD-10-CM | POA: Diagnosis not present

## 2023-01-17 DIAGNOSIS — M6259 Muscle wasting and atrophy, not elsewhere classified, multiple sites: Secondary | ICD-10-CM | POA: Diagnosis not present

## 2023-01-17 DIAGNOSIS — I69351 Hemiplegia and hemiparesis following cerebral infarction affecting right dominant side: Secondary | ICD-10-CM | POA: Diagnosis not present

## 2023-01-17 DIAGNOSIS — I69320 Aphasia following cerebral infarction: Secondary | ICD-10-CM | POA: Diagnosis not present

## 2023-01-22 DIAGNOSIS — E1165 Type 2 diabetes mellitus with hyperglycemia: Secondary | ICD-10-CM | POA: Diagnosis not present

## 2023-01-22 DIAGNOSIS — I2510 Atherosclerotic heart disease of native coronary artery without angina pectoris: Secondary | ICD-10-CM | POA: Diagnosis not present

## 2023-01-22 DIAGNOSIS — H02004 Unspecified entropion of left upper eyelid: Secondary | ICD-10-CM | POA: Diagnosis not present

## 2023-01-22 DIAGNOSIS — D649 Anemia, unspecified: Secondary | ICD-10-CM | POA: Diagnosis not present

## 2023-01-22 DIAGNOSIS — J449 Chronic obstructive pulmonary disease, unspecified: Secondary | ICD-10-CM | POA: Diagnosis not present

## 2023-01-22 DIAGNOSIS — K5900 Constipation, unspecified: Secondary | ICD-10-CM | POA: Diagnosis not present

## 2023-01-22 DIAGNOSIS — K250 Acute gastric ulcer with hemorrhage: Secondary | ICD-10-CM | POA: Diagnosis not present

## 2023-01-22 DIAGNOSIS — H109 Unspecified conjunctivitis: Secondary | ICD-10-CM | POA: Diagnosis not present

## 2023-01-25 DIAGNOSIS — J449 Chronic obstructive pulmonary disease, unspecified: Secondary | ICD-10-CM | POA: Diagnosis not present

## 2023-01-25 DIAGNOSIS — K250 Acute gastric ulcer with hemorrhage: Secondary | ICD-10-CM | POA: Diagnosis not present

## 2023-01-25 DIAGNOSIS — D649 Anemia, unspecified: Secondary | ICD-10-CM | POA: Diagnosis not present

## 2023-01-25 DIAGNOSIS — Z8673 Personal history of transient ischemic attack (TIA), and cerebral infarction without residual deficits: Secondary | ICD-10-CM | POA: Diagnosis not present

## 2023-01-29 DIAGNOSIS — M6259 Muscle wasting and atrophy, not elsewhere classified, multiple sites: Secondary | ICD-10-CM | POA: Diagnosis not present

## 2023-01-29 DIAGNOSIS — I69320 Aphasia following cerebral infarction: Secondary | ICD-10-CM | POA: Diagnosis not present

## 2023-01-29 DIAGNOSIS — Z9181 History of falling: Secondary | ICD-10-CM | POA: Diagnosis not present

## 2023-01-29 DIAGNOSIS — I69351 Hemiplegia and hemiparesis following cerebral infarction affecting right dominant side: Secondary | ICD-10-CM | POA: Diagnosis not present

## 2023-01-29 DIAGNOSIS — M6281 Muscle weakness (generalized): Secondary | ICD-10-CM | POA: Diagnosis not present

## 2023-01-31 DIAGNOSIS — M25562 Pain in left knee: Secondary | ICD-10-CM | POA: Diagnosis not present

## 2023-01-31 DIAGNOSIS — I69351 Hemiplegia and hemiparesis following cerebral infarction affecting right dominant side: Secondary | ICD-10-CM | POA: Diagnosis not present

## 2023-01-31 DIAGNOSIS — Z9181 History of falling: Secondary | ICD-10-CM | POA: Diagnosis not present

## 2023-01-31 DIAGNOSIS — M6281 Muscle weakness (generalized): Secondary | ICD-10-CM | POA: Diagnosis not present

## 2023-01-31 DIAGNOSIS — I69320 Aphasia following cerebral infarction: Secondary | ICD-10-CM | POA: Diagnosis not present

## 2023-01-31 DIAGNOSIS — M6259 Muscle wasting and atrophy, not elsewhere classified, multiple sites: Secondary | ICD-10-CM | POA: Diagnosis not present

## 2023-02-05 DIAGNOSIS — M25562 Pain in left knee: Secondary | ICD-10-CM | POA: Diagnosis not present

## 2023-02-05 DIAGNOSIS — I69351 Hemiplegia and hemiparesis following cerebral infarction affecting right dominant side: Secondary | ICD-10-CM | POA: Diagnosis not present

## 2023-02-05 DIAGNOSIS — M6281 Muscle weakness (generalized): Secondary | ICD-10-CM | POA: Diagnosis not present

## 2023-02-05 DIAGNOSIS — Z9181 History of falling: Secondary | ICD-10-CM | POA: Diagnosis not present

## 2023-02-05 DIAGNOSIS — M6259 Muscle wasting and atrophy, not elsewhere classified, multiple sites: Secondary | ICD-10-CM | POA: Diagnosis not present

## 2023-02-05 DIAGNOSIS — I69320 Aphasia following cerebral infarction: Secondary | ICD-10-CM | POA: Diagnosis not present

## 2023-02-06 DIAGNOSIS — H1012 Acute atopic conjunctivitis, left eye: Secondary | ICD-10-CM | POA: Diagnosis not present

## 2023-02-06 DIAGNOSIS — M6250 Muscle wasting and atrophy, not elsewhere classified, unspecified site: Secondary | ICD-10-CM | POA: Diagnosis not present

## 2023-02-06 DIAGNOSIS — E1165 Type 2 diabetes mellitus with hyperglycemia: Secondary | ICD-10-CM | POA: Diagnosis not present

## 2023-02-07 DIAGNOSIS — M25562 Pain in left knee: Secondary | ICD-10-CM | POA: Diagnosis not present

## 2023-02-07 DIAGNOSIS — I69351 Hemiplegia and hemiparesis following cerebral infarction affecting right dominant side: Secondary | ICD-10-CM | POA: Diagnosis not present

## 2023-02-07 DIAGNOSIS — I69320 Aphasia following cerebral infarction: Secondary | ICD-10-CM | POA: Diagnosis not present

## 2023-02-07 DIAGNOSIS — Z9181 History of falling: Secondary | ICD-10-CM | POA: Diagnosis not present

## 2023-02-07 DIAGNOSIS — M6259 Muscle wasting and atrophy, not elsewhere classified, multiple sites: Secondary | ICD-10-CM | POA: Diagnosis not present

## 2023-02-07 DIAGNOSIS — M6281 Muscle weakness (generalized): Secondary | ICD-10-CM | POA: Diagnosis not present

## 2023-02-08 ENCOUNTER — Other Ambulatory Visit: Payer: Self-pay | Admitting: *Deleted

## 2023-02-08 DIAGNOSIS — D649 Anemia, unspecified: Secondary | ICD-10-CM | POA: Diagnosis not present

## 2023-02-08 DIAGNOSIS — R5383 Other fatigue: Secondary | ICD-10-CM | POA: Diagnosis not present

## 2023-02-08 NOTE — Patient Outreach (Signed)
Post- Acute Care Manager follow up. Mrs. Mansker resides in [facility] skilled nursing facility.    Facility site visit to [facility]. Met with Fritzi Mandes, Child psychotherapist and [place], Sales promotion account executive. Mrs. Oelfke is making slow progress with therapy. From home with spouse. Family meeting scheduled to discuss transition plans with spouse.  Will continue to follow.   Raiford Noble, MSN, RN, BSN [place]  [facility], [facility] RN Post- Acute Care Manager Direct Dial: [phone]

## 2023-02-12 DIAGNOSIS — M6281 Muscle weakness (generalized): Secondary | ICD-10-CM | POA: Diagnosis not present

## 2023-02-12 DIAGNOSIS — M6259 Muscle wasting and atrophy, not elsewhere classified, multiple sites: Secondary | ICD-10-CM | POA: Diagnosis not present

## 2023-02-12 DIAGNOSIS — I69320 Aphasia following cerebral infarction: Secondary | ICD-10-CM | POA: Diagnosis not present

## 2023-02-12 DIAGNOSIS — I69351 Hemiplegia and hemiparesis following cerebral infarction affecting right dominant side: Secondary | ICD-10-CM | POA: Diagnosis not present

## 2023-02-12 DIAGNOSIS — Z9181 History of falling: Secondary | ICD-10-CM | POA: Diagnosis not present

## 2023-02-12 DIAGNOSIS — M25562 Pain in left knee: Secondary | ICD-10-CM | POA: Diagnosis not present

## 2023-02-14 DIAGNOSIS — M25562 Pain in left knee: Secondary | ICD-10-CM | POA: Diagnosis not present

## 2023-02-14 DIAGNOSIS — M6259 Muscle wasting and atrophy, not elsewhere classified, multiple sites: Secondary | ICD-10-CM | POA: Diagnosis not present

## 2023-02-14 DIAGNOSIS — Z9181 History of falling: Secondary | ICD-10-CM | POA: Diagnosis not present

## 2023-02-14 DIAGNOSIS — M6281 Muscle weakness (generalized): Secondary | ICD-10-CM | POA: Diagnosis not present

## 2023-02-14 DIAGNOSIS — I69351 Hemiplegia and hemiparesis following cerebral infarction affecting right dominant side: Secondary | ICD-10-CM | POA: Diagnosis not present

## 2023-02-14 DIAGNOSIS — I69320 Aphasia following cerebral infarction: Secondary | ICD-10-CM | POA: Diagnosis not present

## 2023-02-19 DIAGNOSIS — R630 Anorexia: Secondary | ICD-10-CM | POA: Diagnosis not present

## 2023-02-19 DIAGNOSIS — D649 Anemia, unspecified: Secondary | ICD-10-CM | POA: Diagnosis not present

## 2023-02-19 DIAGNOSIS — K250 Acute gastric ulcer with hemorrhage: Secondary | ICD-10-CM | POA: Diagnosis not present

## 2023-02-19 DIAGNOSIS — Z9181 History of falling: Secondary | ICD-10-CM | POA: Diagnosis not present

## 2023-02-19 DIAGNOSIS — M25562 Pain in left knee: Secondary | ICD-10-CM | POA: Diagnosis not present

## 2023-02-19 DIAGNOSIS — I69320 Aphasia following cerebral infarction: Secondary | ICD-10-CM | POA: Diagnosis not present

## 2023-02-19 DIAGNOSIS — J449 Chronic obstructive pulmonary disease, unspecified: Secondary | ICD-10-CM | POA: Diagnosis not present

## 2023-02-19 DIAGNOSIS — I2510 Atherosclerotic heart disease of native coronary artery without angina pectoris: Secondary | ICD-10-CM | POA: Diagnosis not present

## 2023-02-19 DIAGNOSIS — Z7902 Long term (current) use of antithrombotics/antiplatelets: Secondary | ICD-10-CM | POA: Diagnosis not present

## 2023-02-19 DIAGNOSIS — I69351 Hemiplegia and hemiparesis following cerebral infarction affecting right dominant side: Secondary | ICD-10-CM | POA: Diagnosis not present

## 2023-02-19 DIAGNOSIS — Z6832 Body mass index (BMI) 32.0-32.9, adult: Secondary | ICD-10-CM | POA: Diagnosis not present

## 2023-02-19 DIAGNOSIS — E1165 Type 2 diabetes mellitus with hyperglycemia: Secondary | ICD-10-CM | POA: Diagnosis not present

## 2023-02-19 DIAGNOSIS — F341 Dysthymic disorder: Secondary | ICD-10-CM | POA: Diagnosis not present

## 2023-02-19 DIAGNOSIS — M6281 Muscle weakness (generalized): Secondary | ICD-10-CM | POA: Diagnosis not present

## 2023-02-19 DIAGNOSIS — M6259 Muscle wasting and atrophy, not elsewhere classified, multiple sites: Secondary | ICD-10-CM | POA: Diagnosis not present

## 2023-02-21 DIAGNOSIS — I69351 Hemiplegia and hemiparesis following cerebral infarction affecting right dominant side: Secondary | ICD-10-CM | POA: Diagnosis not present

## 2023-02-21 DIAGNOSIS — I69320 Aphasia following cerebral infarction: Secondary | ICD-10-CM | POA: Diagnosis not present

## 2023-02-21 DIAGNOSIS — M25562 Pain in left knee: Secondary | ICD-10-CM | POA: Diagnosis not present

## 2023-02-21 DIAGNOSIS — M6281 Muscle weakness (generalized): Secondary | ICD-10-CM | POA: Diagnosis not present

## 2023-02-21 DIAGNOSIS — Z9181 History of falling: Secondary | ICD-10-CM | POA: Diagnosis not present

## 2023-02-21 DIAGNOSIS — M6259 Muscle wasting and atrophy, not elsewhere classified, multiple sites: Secondary | ICD-10-CM | POA: Diagnosis not present

## 2023-02-22 DIAGNOSIS — D649 Anemia, unspecified: Secondary | ICD-10-CM | POA: Diagnosis not present

## 2023-02-26 DIAGNOSIS — M6259 Muscle wasting and atrophy, not elsewhere classified, multiple sites: Secondary | ICD-10-CM | POA: Diagnosis not present

## 2023-02-26 DIAGNOSIS — I69351 Hemiplegia and hemiparesis following cerebral infarction affecting right dominant side: Secondary | ICD-10-CM | POA: Diagnosis not present

## 2023-02-26 DIAGNOSIS — I69320 Aphasia following cerebral infarction: Secondary | ICD-10-CM | POA: Diagnosis not present

## 2023-02-26 DIAGNOSIS — M25562 Pain in left knee: Secondary | ICD-10-CM | POA: Diagnosis not present

## 2023-02-26 DIAGNOSIS — M6281 Muscle weakness (generalized): Secondary | ICD-10-CM | POA: Diagnosis not present

## 2023-02-26 DIAGNOSIS — Z9181 History of falling: Secondary | ICD-10-CM | POA: Diagnosis not present

## 2023-02-27 DIAGNOSIS — E785 Hyperlipidemia, unspecified: Secondary | ICD-10-CM | POA: Diagnosis not present

## 2023-02-27 DIAGNOSIS — I69320 Aphasia following cerebral infarction: Secondary | ICD-10-CM | POA: Diagnosis not present

## 2023-02-27 DIAGNOSIS — I69351 Hemiplegia and hemiparesis following cerebral infarction affecting right dominant side: Secondary | ICD-10-CM | POA: Diagnosis not present

## 2023-02-27 DIAGNOSIS — I2510 Atherosclerotic heart disease of native coronary artery without angina pectoris: Secondary | ICD-10-CM | POA: Diagnosis not present

## 2023-02-27 DIAGNOSIS — C50919 Malignant neoplasm of unspecified site of unspecified female breast: Secondary | ICD-10-CM | POA: Diagnosis not present

## 2023-02-27 DIAGNOSIS — E1165 Type 2 diabetes mellitus with hyperglycemia: Secondary | ICD-10-CM | POA: Diagnosis not present

## 2023-02-27 DIAGNOSIS — J449 Chronic obstructive pulmonary disease, unspecified: Secondary | ICD-10-CM | POA: Diagnosis not present

## 2023-02-27 DIAGNOSIS — D649 Anemia, unspecified: Secondary | ICD-10-CM | POA: Diagnosis not present

## 2023-03-01 ENCOUNTER — Other Ambulatory Visit: Payer: Self-pay | Admitting: *Deleted

## 2023-03-01 NOTE — Patient Outreach (Signed)
Post-Acute Care Manager follow up.   Telephonic meeting with [facility] SN[facility] social worker team and therapy manager.  Mrs. Stocum returned home on yesterday, Wednesday 02/28/23. Will have [facility] for PT/OT/ST.  PCP office [facility] manages it's own care management services.  Raiford Noble, MSN, RN, BSN [place]  [facility], [facility] RN Post- Acute Care Manager Direct Dial: [phone]

## 2023-03-02 DIAGNOSIS — E785 Hyperlipidemia, unspecified: Secondary | ICD-10-CM | POA: Diagnosis not present

## 2023-03-02 DIAGNOSIS — N393 Stress incontinence (female) (male): Secondary | ICD-10-CM | POA: Diagnosis not present

## 2023-03-02 DIAGNOSIS — Z6832 Body mass index (BMI) 32.0-32.9, adult: Secondary | ICD-10-CM | POA: Diagnosis not present

## 2023-03-02 DIAGNOSIS — M170 Bilateral primary osteoarthritis of knee: Secondary | ICD-10-CM | POA: Diagnosis not present

## 2023-03-02 DIAGNOSIS — J449 Chronic obstructive pulmonary disease, unspecified: Secondary | ICD-10-CM | POA: Diagnosis not present

## 2023-03-02 DIAGNOSIS — M889 Osteitis deformans of unspecified bone: Secondary | ICD-10-CM | POA: Diagnosis not present

## 2023-03-02 DIAGNOSIS — I69320 Aphasia following cerebral infarction: Secondary | ICD-10-CM | POA: Diagnosis not present

## 2023-03-02 DIAGNOSIS — M21371 Foot drop, right foot: Secondary | ICD-10-CM | POA: Diagnosis not present

## 2023-03-02 DIAGNOSIS — I69351 Hemiplegia and hemiparesis following cerebral infarction affecting right dominant side: Secondary | ICD-10-CM | POA: Diagnosis not present

## 2023-03-02 DIAGNOSIS — E669 Obesity, unspecified: Secondary | ICD-10-CM | POA: Diagnosis not present

## 2023-03-02 DIAGNOSIS — K2100 Gastro-esophageal reflux disease with esophagitis, without bleeding: Secondary | ICD-10-CM | POA: Diagnosis not present

## 2023-03-02 DIAGNOSIS — K250 Acute gastric ulcer with hemorrhage: Secondary | ICD-10-CM | POA: Diagnosis not present

## 2023-03-02 DIAGNOSIS — R1310 Dysphagia, unspecified: Secondary | ICD-10-CM | POA: Diagnosis not present

## 2023-03-02 DIAGNOSIS — D509 Iron deficiency anemia, unspecified: Secondary | ICD-10-CM | POA: Diagnosis not present

## 2023-03-02 DIAGNOSIS — N390 Urinary tract infection, site not specified: Secondary | ICD-10-CM | POA: Diagnosis not present

## 2023-03-02 DIAGNOSIS — K449 Diaphragmatic hernia without obstruction or gangrene: Secondary | ICD-10-CM | POA: Diagnosis not present

## 2023-03-02 DIAGNOSIS — F341 Dysthymic disorder: Secondary | ICD-10-CM | POA: Diagnosis not present

## 2023-03-02 DIAGNOSIS — I25119 Atherosclerotic heart disease of native coronary artery with unspecified angina pectoris: Secondary | ICD-10-CM | POA: Diagnosis not present

## 2023-03-02 DIAGNOSIS — M6259 Muscle wasting and atrophy, not elsewhere classified, multiple sites: Secondary | ICD-10-CM | POA: Diagnosis not present

## 2023-03-02 DIAGNOSIS — Z556 Problems related to health literacy: Secondary | ICD-10-CM | POA: Diagnosis not present

## 2023-03-02 DIAGNOSIS — E1165 Type 2 diabetes mellitus with hyperglycemia: Secondary | ICD-10-CM | POA: Diagnosis not present

## 2023-03-02 DIAGNOSIS — M810 Age-related osteoporosis without current pathological fracture: Secondary | ICD-10-CM | POA: Diagnosis not present

## 2023-03-02 DIAGNOSIS — H538 Other visual disturbances: Secondary | ICD-10-CM | POA: Diagnosis not present

## 2023-03-02 DIAGNOSIS — C50912 Malignant neoplasm of unspecified site of left female breast: Secondary | ICD-10-CM | POA: Diagnosis not present

## 2023-03-02 DIAGNOSIS — D630 Anemia in neoplastic disease: Secondary | ICD-10-CM | POA: Diagnosis not present

## 2023-03-07 DIAGNOSIS — J449 Chronic obstructive pulmonary disease, unspecified: Secondary | ICD-10-CM | POA: Diagnosis not present

## 2023-03-07 DIAGNOSIS — I69320 Aphasia following cerebral infarction: Secondary | ICD-10-CM | POA: Diagnosis not present

## 2023-03-07 DIAGNOSIS — I69351 Hemiplegia and hemiparesis following cerebral infarction affecting right dominant side: Secondary | ICD-10-CM | POA: Diagnosis not present

## 2023-03-07 DIAGNOSIS — E1165 Type 2 diabetes mellitus with hyperglycemia: Secondary | ICD-10-CM | POA: Diagnosis not present

## 2023-03-07 DIAGNOSIS — I25119 Atherosclerotic heart disease of native coronary artery with unspecified angina pectoris: Secondary | ICD-10-CM | POA: Diagnosis not present

## 2023-03-07 DIAGNOSIS — M6259 Muscle wasting and atrophy, not elsewhere classified, multiple sites: Secondary | ICD-10-CM | POA: Diagnosis not present

## 2023-03-08 DIAGNOSIS — J449 Chronic obstructive pulmonary disease, unspecified: Secondary | ICD-10-CM | POA: Diagnosis not present

## 2023-03-08 DIAGNOSIS — I69351 Hemiplegia and hemiparesis following cerebral infarction affecting right dominant side: Secondary | ICD-10-CM | POA: Diagnosis not present

## 2023-03-08 DIAGNOSIS — I25119 Atherosclerotic heart disease of native coronary artery with unspecified angina pectoris: Secondary | ICD-10-CM | POA: Diagnosis not present

## 2023-03-08 DIAGNOSIS — I69320 Aphasia following cerebral infarction: Secondary | ICD-10-CM | POA: Diagnosis not present

## 2023-03-08 DIAGNOSIS — M6259 Muscle wasting and atrophy, not elsewhere classified, multiple sites: Secondary | ICD-10-CM | POA: Diagnosis not present

## 2023-03-08 DIAGNOSIS — E1165 Type 2 diabetes mellitus with hyperglycemia: Secondary | ICD-10-CM | POA: Diagnosis not present

## 2023-03-09 DIAGNOSIS — M6259 Muscle wasting and atrophy, not elsewhere classified, multiple sites: Secondary | ICD-10-CM | POA: Diagnosis not present

## 2023-03-09 DIAGNOSIS — I69351 Hemiplegia and hemiparesis following cerebral infarction affecting right dominant side: Secondary | ICD-10-CM | POA: Diagnosis not present

## 2023-03-09 DIAGNOSIS — E1165 Type 2 diabetes mellitus with hyperglycemia: Secondary | ICD-10-CM | POA: Diagnosis not present

## 2023-03-09 DIAGNOSIS — I25119 Atherosclerotic heart disease of native coronary artery with unspecified angina pectoris: Secondary | ICD-10-CM | POA: Diagnosis not present

## 2023-03-09 DIAGNOSIS — I69320 Aphasia following cerebral infarction: Secondary | ICD-10-CM | POA: Diagnosis not present

## 2023-03-09 DIAGNOSIS — J449 Chronic obstructive pulmonary disease, unspecified: Secondary | ICD-10-CM | POA: Diagnosis not present

## 2023-03-16 DIAGNOSIS — I69320 Aphasia following cerebral infarction: Secondary | ICD-10-CM | POA: Diagnosis not present

## 2023-03-16 DIAGNOSIS — J449 Chronic obstructive pulmonary disease, unspecified: Secondary | ICD-10-CM | POA: Diagnosis not present

## 2023-03-16 DIAGNOSIS — I25119 Atherosclerotic heart disease of native coronary artery with unspecified angina pectoris: Secondary | ICD-10-CM | POA: Diagnosis not present

## 2023-03-16 DIAGNOSIS — E1165 Type 2 diabetes mellitus with hyperglycemia: Secondary | ICD-10-CM | POA: Diagnosis not present

## 2023-03-16 DIAGNOSIS — M6259 Muscle wasting and atrophy, not elsewhere classified, multiple sites: Secondary | ICD-10-CM | POA: Diagnosis not present

## 2023-03-16 DIAGNOSIS — I69351 Hemiplegia and hemiparesis following cerebral infarction affecting right dominant side: Secondary | ICD-10-CM | POA: Diagnosis not present

## 2023-03-19 DIAGNOSIS — K250 Acute gastric ulcer with hemorrhage: Secondary | ICD-10-CM | POA: Diagnosis not present

## 2023-03-19 DIAGNOSIS — I69320 Aphasia following cerebral infarction: Secondary | ICD-10-CM | POA: Diagnosis not present

## 2023-03-19 DIAGNOSIS — I25119 Atherosclerotic heart disease of native coronary artery with unspecified angina pectoris: Secondary | ICD-10-CM | POA: Diagnosis not present

## 2023-03-19 DIAGNOSIS — M6259 Muscle wasting and atrophy, not elsewhere classified, multiple sites: Secondary | ICD-10-CM | POA: Diagnosis not present

## 2023-03-19 DIAGNOSIS — D630 Anemia in neoplastic disease: Secondary | ICD-10-CM | POA: Diagnosis not present

## 2023-03-19 DIAGNOSIS — F341 Dysthymic disorder: Secondary | ICD-10-CM | POA: Diagnosis not present

## 2023-03-19 DIAGNOSIS — E1165 Type 2 diabetes mellitus with hyperglycemia: Secondary | ICD-10-CM | POA: Diagnosis not present

## 2023-03-19 DIAGNOSIS — J449 Chronic obstructive pulmonary disease, unspecified: Secondary | ICD-10-CM | POA: Diagnosis not present

## 2023-03-19 DIAGNOSIS — C50912 Malignant neoplasm of unspecified site of left female breast: Secondary | ICD-10-CM | POA: Diagnosis not present

## 2023-03-19 DIAGNOSIS — M170 Bilateral primary osteoarthritis of knee: Secondary | ICD-10-CM | POA: Diagnosis not present

## 2023-03-19 DIAGNOSIS — H538 Other visual disturbances: Secondary | ICD-10-CM | POA: Diagnosis not present

## 2023-03-19 DIAGNOSIS — I69351 Hemiplegia and hemiparesis following cerebral infarction affecting right dominant side: Secondary | ICD-10-CM | POA: Diagnosis not present

## 2023-03-20 DIAGNOSIS — I69320 Aphasia following cerebral infarction: Secondary | ICD-10-CM | POA: Diagnosis not present

## 2023-03-20 DIAGNOSIS — J449 Chronic obstructive pulmonary disease, unspecified: Secondary | ICD-10-CM | POA: Diagnosis not present

## 2023-03-20 DIAGNOSIS — M6259 Muscle wasting and atrophy, not elsewhere classified, multiple sites: Secondary | ICD-10-CM | POA: Diagnosis not present

## 2023-03-20 DIAGNOSIS — I25119 Atherosclerotic heart disease of native coronary artery with unspecified angina pectoris: Secondary | ICD-10-CM | POA: Diagnosis not present

## 2023-03-20 DIAGNOSIS — I69351 Hemiplegia and hemiparesis following cerebral infarction affecting right dominant side: Secondary | ICD-10-CM | POA: Diagnosis not present

## 2023-03-20 DIAGNOSIS — E1165 Type 2 diabetes mellitus with hyperglycemia: Secondary | ICD-10-CM | POA: Diagnosis not present

## 2023-03-26 DIAGNOSIS — I69351 Hemiplegia and hemiparesis following cerebral infarction affecting right dominant side: Secondary | ICD-10-CM | POA: Diagnosis not present

## 2023-03-26 DIAGNOSIS — E1165 Type 2 diabetes mellitus with hyperglycemia: Secondary | ICD-10-CM | POA: Diagnosis not present

## 2023-03-26 DIAGNOSIS — J449 Chronic obstructive pulmonary disease, unspecified: Secondary | ICD-10-CM | POA: Diagnosis not present

## 2023-03-26 DIAGNOSIS — I25119 Atherosclerotic heart disease of native coronary artery with unspecified angina pectoris: Secondary | ICD-10-CM | POA: Diagnosis not present

## 2023-03-26 DIAGNOSIS — M6259 Muscle wasting and atrophy, not elsewhere classified, multiple sites: Secondary | ICD-10-CM | POA: Diagnosis not present

## 2023-03-26 DIAGNOSIS — I69320 Aphasia following cerebral infarction: Secondary | ICD-10-CM | POA: Diagnosis not present

## 2023-03-27 DIAGNOSIS — E1165 Type 2 diabetes mellitus with hyperglycemia: Secondary | ICD-10-CM | POA: Diagnosis not present

## 2023-03-27 DIAGNOSIS — J449 Chronic obstructive pulmonary disease, unspecified: Secondary | ICD-10-CM | POA: Diagnosis not present

## 2023-03-27 DIAGNOSIS — I69320 Aphasia following cerebral infarction: Secondary | ICD-10-CM | POA: Diagnosis not present

## 2023-03-27 DIAGNOSIS — I69351 Hemiplegia and hemiparesis following cerebral infarction affecting right dominant side: Secondary | ICD-10-CM | POA: Diagnosis not present

## 2023-03-27 DIAGNOSIS — I25119 Atherosclerotic heart disease of native coronary artery with unspecified angina pectoris: Secondary | ICD-10-CM | POA: Diagnosis not present

## 2023-03-27 DIAGNOSIS — M6259 Muscle wasting and atrophy, not elsewhere classified, multiple sites: Secondary | ICD-10-CM | POA: Diagnosis not present

## 2023-04-01 DIAGNOSIS — N390 Urinary tract infection, site not specified: Secondary | ICD-10-CM | POA: Diagnosis not present

## 2023-04-01 DIAGNOSIS — D509 Iron deficiency anemia, unspecified: Secondary | ICD-10-CM | POA: Diagnosis not present

## 2023-04-01 DIAGNOSIS — M21371 Foot drop, right foot: Secondary | ICD-10-CM | POA: Diagnosis not present

## 2023-04-01 DIAGNOSIS — I69351 Hemiplegia and hemiparesis following cerebral infarction affecting right dominant side: Secondary | ICD-10-CM | POA: Diagnosis not present

## 2023-04-01 DIAGNOSIS — E669 Obesity, unspecified: Secondary | ICD-10-CM | POA: Diagnosis not present

## 2023-04-01 DIAGNOSIS — N393 Stress incontinence (female) (male): Secondary | ICD-10-CM | POA: Diagnosis not present

## 2023-04-01 DIAGNOSIS — K2100 Gastro-esophageal reflux disease with esophagitis, without bleeding: Secondary | ICD-10-CM | POA: Diagnosis not present

## 2023-04-01 DIAGNOSIS — I25119 Atherosclerotic heart disease of native coronary artery with unspecified angina pectoris: Secondary | ICD-10-CM | POA: Diagnosis not present

## 2023-04-01 DIAGNOSIS — Z6832 Body mass index (BMI) 32.0-32.9, adult: Secondary | ICD-10-CM | POA: Diagnosis not present

## 2023-04-01 DIAGNOSIS — F341 Dysthymic disorder: Secondary | ICD-10-CM | POA: Diagnosis not present

## 2023-04-01 DIAGNOSIS — I69320 Aphasia following cerebral infarction: Secondary | ICD-10-CM | POA: Diagnosis not present

## 2023-04-01 DIAGNOSIS — C50912 Malignant neoplasm of unspecified site of left female breast: Secondary | ICD-10-CM | POA: Diagnosis not present

## 2023-04-01 DIAGNOSIS — K449 Diaphragmatic hernia without obstruction or gangrene: Secondary | ICD-10-CM | POA: Diagnosis not present

## 2023-04-01 DIAGNOSIS — M810 Age-related osteoporosis without current pathological fracture: Secondary | ICD-10-CM | POA: Diagnosis not present

## 2023-04-01 DIAGNOSIS — R1310 Dysphagia, unspecified: Secondary | ICD-10-CM | POA: Diagnosis not present

## 2023-04-01 DIAGNOSIS — E1165 Type 2 diabetes mellitus with hyperglycemia: Secondary | ICD-10-CM | POA: Diagnosis not present

## 2023-04-01 DIAGNOSIS — Z556 Problems related to health literacy: Secondary | ICD-10-CM | POA: Diagnosis not present

## 2023-04-01 DIAGNOSIS — M170 Bilateral primary osteoarthritis of knee: Secondary | ICD-10-CM | POA: Diagnosis not present

## 2023-04-01 DIAGNOSIS — K250 Acute gastric ulcer with hemorrhage: Secondary | ICD-10-CM | POA: Diagnosis not present

## 2023-04-01 DIAGNOSIS — E785 Hyperlipidemia, unspecified: Secondary | ICD-10-CM | POA: Diagnosis not present

## 2023-04-01 DIAGNOSIS — M6259 Muscle wasting and atrophy, not elsewhere classified, multiple sites: Secondary | ICD-10-CM | POA: Diagnosis not present

## 2023-04-01 DIAGNOSIS — D630 Anemia in neoplastic disease: Secondary | ICD-10-CM | POA: Diagnosis not present

## 2023-04-01 DIAGNOSIS — H538 Other visual disturbances: Secondary | ICD-10-CM | POA: Diagnosis not present

## 2023-04-01 DIAGNOSIS — M889 Osteitis deformans of unspecified bone: Secondary | ICD-10-CM | POA: Diagnosis not present

## 2023-04-01 DIAGNOSIS — J449 Chronic obstructive pulmonary disease, unspecified: Secondary | ICD-10-CM | POA: Diagnosis not present

## 2023-04-02 DIAGNOSIS — J449 Chronic obstructive pulmonary disease, unspecified: Secondary | ICD-10-CM | POA: Diagnosis not present

## 2023-04-02 DIAGNOSIS — I69320 Aphasia following cerebral infarction: Secondary | ICD-10-CM | POA: Diagnosis not present

## 2023-04-02 DIAGNOSIS — M6259 Muscle wasting and atrophy, not elsewhere classified, multiple sites: Secondary | ICD-10-CM | POA: Diagnosis not present

## 2023-04-02 DIAGNOSIS — E1165 Type 2 diabetes mellitus with hyperglycemia: Secondary | ICD-10-CM | POA: Diagnosis not present

## 2023-04-02 DIAGNOSIS — I69351 Hemiplegia and hemiparesis following cerebral infarction affecting right dominant side: Secondary | ICD-10-CM | POA: Diagnosis not present

## 2023-04-02 DIAGNOSIS — I25119 Atherosclerotic heart disease of native coronary artery with unspecified angina pectoris: Secondary | ICD-10-CM | POA: Diagnosis not present

## 2023-04-03 DIAGNOSIS — I69351 Hemiplegia and hemiparesis following cerebral infarction affecting right dominant side: Secondary | ICD-10-CM | POA: Diagnosis not present

## 2023-04-03 DIAGNOSIS — I25119 Atherosclerotic heart disease of native coronary artery with unspecified angina pectoris: Secondary | ICD-10-CM | POA: Diagnosis not present

## 2023-04-03 DIAGNOSIS — E1165 Type 2 diabetes mellitus with hyperglycemia: Secondary | ICD-10-CM | POA: Diagnosis not present

## 2023-04-03 DIAGNOSIS — I69320 Aphasia following cerebral infarction: Secondary | ICD-10-CM | POA: Diagnosis not present

## 2023-04-03 DIAGNOSIS — J449 Chronic obstructive pulmonary disease, unspecified: Secondary | ICD-10-CM | POA: Diagnosis not present

## 2023-04-03 DIAGNOSIS — M6259 Muscle wasting and atrophy, not elsewhere classified, multiple sites: Secondary | ICD-10-CM | POA: Diagnosis not present

## 2023-04-10 DIAGNOSIS — E1165 Type 2 diabetes mellitus with hyperglycemia: Secondary | ICD-10-CM | POA: Diagnosis not present

## 2023-04-10 DIAGNOSIS — I69320 Aphasia following cerebral infarction: Secondary | ICD-10-CM | POA: Diagnosis not present

## 2023-04-10 DIAGNOSIS — I69351 Hemiplegia and hemiparesis following cerebral infarction affecting right dominant side: Secondary | ICD-10-CM | POA: Diagnosis not present

## 2023-04-10 DIAGNOSIS — M6259 Muscle wasting and atrophy, not elsewhere classified, multiple sites: Secondary | ICD-10-CM | POA: Diagnosis not present

## 2023-04-10 DIAGNOSIS — J449 Chronic obstructive pulmonary disease, unspecified: Secondary | ICD-10-CM | POA: Diagnosis not present

## 2023-04-10 DIAGNOSIS — I25119 Atherosclerotic heart disease of native coronary artery with unspecified angina pectoris: Secondary | ICD-10-CM | POA: Diagnosis not present

## 2023-04-11 DIAGNOSIS — I25119 Atherosclerotic heart disease of native coronary artery with unspecified angina pectoris: Secondary | ICD-10-CM | POA: Diagnosis not present

## 2023-04-11 DIAGNOSIS — M6259 Muscle wasting and atrophy, not elsewhere classified, multiple sites: Secondary | ICD-10-CM | POA: Diagnosis not present

## 2023-04-11 DIAGNOSIS — E1165 Type 2 diabetes mellitus with hyperglycemia: Secondary | ICD-10-CM | POA: Diagnosis not present

## 2023-04-11 DIAGNOSIS — I69320 Aphasia following cerebral infarction: Secondary | ICD-10-CM | POA: Diagnosis not present

## 2023-04-11 DIAGNOSIS — J449 Chronic obstructive pulmonary disease, unspecified: Secondary | ICD-10-CM | POA: Diagnosis not present

## 2023-04-11 DIAGNOSIS — I69351 Hemiplegia and hemiparesis following cerebral infarction affecting right dominant side: Secondary | ICD-10-CM | POA: Diagnosis not present

## 2023-04-16 DIAGNOSIS — M6259 Muscle wasting and atrophy, not elsewhere classified, multiple sites: Secondary | ICD-10-CM | POA: Diagnosis not present

## 2023-04-16 DIAGNOSIS — I69320 Aphasia following cerebral infarction: Secondary | ICD-10-CM | POA: Diagnosis not present

## 2023-04-16 DIAGNOSIS — E1165 Type 2 diabetes mellitus with hyperglycemia: Secondary | ICD-10-CM | POA: Diagnosis not present

## 2023-04-16 DIAGNOSIS — I25119 Atherosclerotic heart disease of native coronary artery with unspecified angina pectoris: Secondary | ICD-10-CM | POA: Diagnosis not present

## 2023-04-16 DIAGNOSIS — J449 Chronic obstructive pulmonary disease, unspecified: Secondary | ICD-10-CM | POA: Diagnosis not present

## 2023-04-16 DIAGNOSIS — I69351 Hemiplegia and hemiparesis following cerebral infarction affecting right dominant side: Secondary | ICD-10-CM | POA: Diagnosis not present

## 2023-04-17 DIAGNOSIS — I69320 Aphasia following cerebral infarction: Secondary | ICD-10-CM | POA: Diagnosis not present

## 2023-04-17 DIAGNOSIS — M6259 Muscle wasting and atrophy, not elsewhere classified, multiple sites: Secondary | ICD-10-CM | POA: Diagnosis not present

## 2023-04-17 DIAGNOSIS — E1165 Type 2 diabetes mellitus with hyperglycemia: Secondary | ICD-10-CM | POA: Diagnosis not present

## 2023-04-17 DIAGNOSIS — J449 Chronic obstructive pulmonary disease, unspecified: Secondary | ICD-10-CM | POA: Diagnosis not present

## 2023-04-17 DIAGNOSIS — I25119 Atherosclerotic heart disease of native coronary artery with unspecified angina pectoris: Secondary | ICD-10-CM | POA: Diagnosis not present

## 2023-04-17 DIAGNOSIS — I69351 Hemiplegia and hemiparesis following cerebral infarction affecting right dominant side: Secondary | ICD-10-CM | POA: Diagnosis not present

## 2023-04-24 DIAGNOSIS — E1165 Type 2 diabetes mellitus with hyperglycemia: Secondary | ICD-10-CM | POA: Diagnosis not present

## 2023-04-24 DIAGNOSIS — M6259 Muscle wasting and atrophy, not elsewhere classified, multiple sites: Secondary | ICD-10-CM | POA: Diagnosis not present

## 2023-04-24 DIAGNOSIS — I69351 Hemiplegia and hemiparesis following cerebral infarction affecting right dominant side: Secondary | ICD-10-CM | POA: Diagnosis not present

## 2023-04-24 DIAGNOSIS — J449 Chronic obstructive pulmonary disease, unspecified: Secondary | ICD-10-CM | POA: Diagnosis not present

## 2023-04-24 DIAGNOSIS — I25119 Atherosclerotic heart disease of native coronary artery with unspecified angina pectoris: Secondary | ICD-10-CM | POA: Diagnosis not present

## 2023-04-24 DIAGNOSIS — I69320 Aphasia following cerebral infarction: Secondary | ICD-10-CM | POA: Diagnosis not present

## 2023-04-25 DIAGNOSIS — I69320 Aphasia following cerebral infarction: Secondary | ICD-10-CM | POA: Diagnosis not present

## 2023-04-25 DIAGNOSIS — E1165 Type 2 diabetes mellitus with hyperglycemia: Secondary | ICD-10-CM | POA: Diagnosis not present

## 2023-04-25 DIAGNOSIS — I69351 Hemiplegia and hemiparesis following cerebral infarction affecting right dominant side: Secondary | ICD-10-CM | POA: Diagnosis not present

## 2023-04-25 DIAGNOSIS — I25119 Atherosclerotic heart disease of native coronary artery with unspecified angina pectoris: Secondary | ICD-10-CM | POA: Diagnosis not present

## 2023-04-25 DIAGNOSIS — J449 Chronic obstructive pulmonary disease, unspecified: Secondary | ICD-10-CM | POA: Diagnosis not present

## 2023-04-25 DIAGNOSIS — M6259 Muscle wasting and atrophy, not elsewhere classified, multiple sites: Secondary | ICD-10-CM | POA: Diagnosis not present

## 2023-04-26 ENCOUNTER — Ambulatory Visit: Payer: Medicare Other | Admitting: Gastroenterology

## 2023-05-02 ENCOUNTER — Telehealth: Payer: Self-pay

## 2023-05-02 NOTE — Telephone Encounter (Signed)
Spoke with patients husband who confirmed that patient will be at appointment on 2/13 with Dr Al Pimple.

## 2023-05-03 ENCOUNTER — Inpatient Hospital Stay: Payer: Medicare Other | Attending: Hematology and Oncology | Admitting: Hematology and Oncology

## 2023-05-03 ENCOUNTER — Encounter: Payer: Self-pay | Admitting: Hematology and Oncology

## 2023-05-03 VITALS — BP 135/68 | HR 81 | Temp 98.0°F | Resp 17 | Wt 175.2 lb

## 2023-05-03 DIAGNOSIS — Z87891 Personal history of nicotine dependence: Secondary | ICD-10-CM | POA: Insufficient documentation

## 2023-05-03 DIAGNOSIS — Z9013 Acquired absence of bilateral breasts and nipples: Secondary | ICD-10-CM | POA: Insufficient documentation

## 2023-05-03 DIAGNOSIS — Z170 Estrogen receptor positive status [ER+]: Secondary | ICD-10-CM | POA: Diagnosis not present

## 2023-05-03 DIAGNOSIS — Z79811 Long term (current) use of aromatase inhibitors: Secondary | ICD-10-CM | POA: Insufficient documentation

## 2023-05-03 DIAGNOSIS — C50111 Malignant neoplasm of central portion of right female breast: Secondary | ICD-10-CM

## 2023-05-03 MED ORDER — ANASTROZOLE 1 MG PO TABS: 1.0000 mg | ORAL_TABLET | Freq: Every day | ORAL | 3 refills | Status: AC

## 2023-05-03 NOTE — Progress Notes (Signed)
 BRIEF ONCOLOGIC HISTORY:  Oncology History  Malignant neoplasm of central portion of right breast in female, estrogen receptor positive (HCC)  2005 Surgery   Pagets of the left nipple, s/p mastectomy and LN biopsy   03/31/2022 Mammogram   Mammogram right breast showed an indeterminate 1 cm mass in the right breast at 1:00.  There is also an indeterminate 3.4 cm area of shadowing tissue in the retroareolar right breast at 9:00.  Limited evaluation of the axilla demonstrates normal lymph nodes. US of the breast confirmed same findings.   04/10/2022 Pathology Results   Right breast needle core biopsy at 1:00 showed invasive lobular carcinoma, grade 2 prognostic showed ER 100% positive strong staining PR 85% positive strong staining Ki-67 of 40% and group 5 HER2 negative.  The retroareolar biopsy also confirmed invasive lobular carcinoma grade 1 which is ER 100% positive strong staining PR 0% negative Ki-67 of 40% and HER2 negative   05/17/2022 Surgery   Right breast mastectomy: ILC, grade 2, 8.3 cm, no LN biopsied, margins negative   08/01/2022 Cancer Staging   Staging form: Breast, AJCC 8th Edition - Pathologic stage from 08/01/2022: Stage IIB (pT3, pN0, cM0, G2, ER+, PR-, HER2-) - Signed by Loa Socks, NP on 10/31/2022 Histologic grading system: 3 grade system     INTERVAL HISTORY:  Discussed the use of AI scribe software for clinical note transcription with the patient, who gave verbal consent to proceed.  History of Present Illness    Brandy Paul is a 78 year old female with breast cancer who presents for follow-up regarding her treatment.  She is currently undergoing treatment for breast cancer with anastrozole. She has had bilateral mastectomies.  She experiences hot flashes and dryness in her mouth as side effects of anastrozole, but finds them manageable.   She has a history of falls associated with a problematic left knee, for which she was receiving gel shots.  Despite receiving three gel shots, she continued to experience falls, one of which resulted in a head injury after hitting her head on the car console, causing an indentation. This incident required hospitalization for a week, followed by two months of rehabilitation at [facility].  She has a history of a stroke, resulting in right-sided weakness, which has not changed. She has been undergoing physical and occupational therapy at home, which has improved her ability to bend her right leg, a function she previously could not perform. She is also engaging in exercises at home. No changes in right-sided weakness since her stroke.  Rest of the pertinent 10 point ROS reviewed and neg.  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)    Prediabetes    hx of   Rash    under right breast and groin line using nizoral ointment to q day per dermatology saw few weeks ago   Stress incontinence  wers depends   Stroke [facility]) 2013   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   Toe fracture, left    Past Surgical History:  Procedure Laterality Date   ABDOMINAL HYSTERECTOMY  1989   complete   APPENDECTOMY  2019   with gallbladder   BIOPSY  11/11/2019   Procedure: BIOPSY;  Surgeon: Sherrilyn Rist, MD;  Location: [facility];  Service: Gastroenterology;;   BREAST BIOPSY Right 04/10/2022   [place] RT BREAST BX W LOC DEV EA ADD LESION IMG BX SPEC US GUIDE 04/10/2022 [facility]   BREAST  BIOPSY Right 04/10/2022   [place] RT BREAST BX W LOC DEV 1ST LESION IMG BX SPEC US GUIDE 04/10/2022 [facility]   CATARACT EXTRACTION Bilateral    Late 1990s   CHOLECYSTECTOMY N/A 12/10/2017   Procedure: LAPAROSCOPIC CHOLECYSTECTOMY WITH INTRAOPERATIVE  ERAS PATHWAY;  Surgeon: Luretha Murphy, MD;  Location: [facility];  Service: General;  Laterality: N/A;   COLONOSCOPY WITH PROPOFOL N/A 12/03/2015   Procedure: COLONOSCOPY WITH PROPOFOL;  Surgeon: Napoleon Form, MD;  Location: [facility];  Service: Endoscopy;  Laterality: N/A;  tba before procdcedure   COLONOSCOPY WITH PROPOFOL N/A 11/11/2019   Procedure: COLONOSCOPY WITH PROPOFOL;  Surgeon: Sherrilyn Rist, MD;  Location: [facility];  Service: Gastroenterology;  Laterality: N/A;   ENTEROSCOPY N/A 11/11/2019   Procedure: ENTEROSCOPY;  Surgeon: Sherrilyn Rist, MD;  Location: [facility];  Service: Gastroenterology;  Laterality: N/A;   IR CHOLANGIOGRAM EXISTING TUBE  08/08/2017   IR CHOLANGIOGRAM EXISTING TUBE  08/27/2017   IR PERC CHOLECYSTOSTOMY  07/13/2017   LAPAROSCOPIC APPENDECTOMY N/A 12/10/2017   Procedure: APPENDECTOMY LAPAROSCOPIC;  Surgeon: Luretha Murphy, MD;  Location: [facility];  Service: General;  Laterality: N/A;   MASTECTOMY Left 2005   1 lymph node removed   MASTECTOMY W/ SENTINEL NODE BIOPSY Right 05/17/2022   Procedure: RIGHT MASTECTOMY;  Surgeon: Manus Rudd, MD;  Location: [facility];  Service: General;  Laterality: Right;   RADIAL KERATOTOMY Bilateral    ROBOTIC ASSISTED BILATERAL SALPINGO OOPHERECTOMY Bilateral 11/04/2015   Procedure: XI ROBOTIC ASSISTED BILATERAL SALPINGO OOPHORECTOMY;  Surgeon: Adolphus Birchwood, MD;  Location: [facility];  Service: Gynecology;  Laterality: Bilateral;   TONSILLECTOMY       ALLERGIES:  Allergies  Allergen Reactions   Ceclor [Cefaclor] Other (See Comments)    REACTION: "SEVERE HEADACHE"     CURRENT MEDICATIONS:  Outpatient Encounter Medications as of 05/03/2023  Medication Sig    acetaminophen (TYLENOL) 500 MG tablet Take 500 mg by mouth daily as needed for mild pain.    albuterol (VENTOLIN HFA) 108 (90 Base) MCG/ACT inhaler Inhale 1 puff into the lungs every evening.   anastrozole (ARIMIDEX) 1 MG tablet Take 1 tablet (1 mg total) by mouth daily. (Patient taking differently: Take 1 mg by mouth every evening.)   atorvastatin (LIPITOR) 40 MG tablet Take 40 mg by mouth at bedtime.    buPROPion (WELLBUTRIN SR) 150 MG 12 hr tablet Take 150 mg by mouth daily.   calcium carbonate (OSCAL) 1500 (600 Ca) MG TABS tablet Take 600 mg of elemental calcium by mouth daily.   clopidogrel (PLAVIX) 75 MG tablet Take 75 mg by mouth daily.   ezetimibe (ZETIA) 10 MG tablet Take 10 mg by mouth at bedtime.    feeding supplement (ENSURE ENLIVE / ENSURE PLUS) LIQD Take 237 mLs by mouth 2 (two) times daily between meals.   ferrous sulfate 325 (65 FE) MG  tablet Take 1 tablet (325 mg total) by mouth daily with breakfast.   isosorbide mononitrate (IMDUR) 60 MG 24 hr tablet Take 1 tablet (60 mg total) by mouth daily. Please make overdue appt with Dr. Katrinka Blazing before anymore refills. Thank you  3rd and final attempt   nitroGLYCERIN (NITROSTAT) 0.4 MG SL tablet Place 1 tablet (0.4 mg total) under the tongue every 5 (five) minutes as needed for chest pain (Call 911 at 3rd dose within 15 minutes).   oxybutynin (DITROPAN-XL) 10 MG 24 hr tablet Take 1 tablet by mouth daily.   pantoprazole (PROTONIX) 40 MG tablet Take 1 tablet (40 mg total) by mouth 2 (two) times daily.   polyethylene glycol (MIRALAX / GLYCOLAX) 17 g packet Take 17 g by mouth daily as needed for moderate constipation.   sertraline (ZOLOFT) 100 MG tablet Take 100 mg by mouth at bedtime.    tiotropium (SPIRIVA) 18 MCG inhalation capsule Place 18 mcg into inhaler and inhale every evening.   trimethoprim (TRIMPEX) 100 MG tablet Take 100 mg by mouth daily.   vitamin B-12 1000 MCG tablet Take 1 tablet (1,000 mcg total) by mouth daily.   No  facility-administered encounter medications on file as of 05/03/2023.     ONCOLOGIC FAMILY HISTORY:  Family History  Problem Relation Age of Onset   CVA Mother    Liver cancer Mother        d. 47   Prostate cancer Father        metastatic, d. 25s   Hypertension Sister    Arthritis Sister    Diabetes Sister    Heart attack Sister    Colon polyps Sister    Cancer Brother        dx 63s, unsure of type   Stroke Maternal Grandfather    Skin cancer Son        stage V, SCC vs BCC? dx 78s   Colon cancer Neg Hx    Esophageal cancer Neg Hx    Rectal cancer Neg Hx      SOCIAL HISTORY:  Social History   Socioeconomic History   Marital status: Married    Spouse name: Merlyn Albert   Number of children: 4   Years of education: 21   Highest education level: Not on file  Occupational History   Occupation: retired  Tobacco Use   Smoking status: Former    Current packs/day: 0.00    Types: Cigarettes    Quit date: 02/17/1994    Years since quitting: 29.2   Smokeless tobacco: Never  Vaping Use   Vaping status: Never Used  Substance and Sexual Activity   Alcohol use: No    Alcohol/week: 0.0 standard drinks of alcohol   Drug use: No   Sexual activity: Not Currently  Other Topics Concern   Not on file  Social History Narrative   Lives with spouse   1-2 cups coffee   Social Drivers of Corporate investment banker Strain: Not on file  Food Insecurity: No Food Insecurity (12/20/2022)   Hunger Vital Sign    Worried About Running Out of Food in the Last Year: Never true    Ran Out of Food in the Last Year: Never true  Transportation Needs: No Transportation Needs (12/20/2022)   PRAPARE - Administrator, Civil Service (Medical): No    Lack of Transportation (Non-Medical): No  Physical Activity: Not on file  Stress: Not on file  Social Connections: Not on file  Intimate  Partner Violence: Not At Risk (12/20/2022)   Humiliation, Afraid, Rape, and Kick questionnaire    Fear of  Current or Ex-Partner: No    Emotionally Abused: No    Physically Abused: No    Sexually Abused: No     OBSERVATIONS/OBJECTIVE:  BP 135/68 (BP Location: Left Arm, Patient Position: Sitting)   Pulse 81   Temp 98 F (36.7 C) (Temporal)   Resp 17   Wt 175 lb 3.2 oz (79.5 kg)   SpO2 97%   BMI 31.04 kg/m  GENERAL: Patient is a well appearing female in no acute distress HEENT:  Sclerae anicteric.  Oropharynx clear and moist. No ulcerations or evidence of oropharyngeal candidiasis. Neck is supple.  NODES:  No cervical, supraclavicular, or axillary lymphadenopathy palpated.  BREAST EXAM:  s/p bilateral mastectomy no sign of local recurrence LUNGS:  Clear to auscultation bilaterally.  No wheezes or rhonchi. HEART:  Regular rate and rhythm. No murmur appreciated. ABDOMEN:  Soft, nontender.  Positive, normoactive bowel sounds. No organomegaly palpated. MSK:  No focal spinal tenderness to palpation. Full range of motion bilaterally in the upper extremities. EXTREMITIES:  No peripheral edema.   SKIN:  Clear with no obvious rashes or skin changes. No nail dyscrasia. NEURO:  Nonfocal. Well oriented.  Appropriate affect.   LABORATORY DATA:  None for this visit.  DIAGNOSTIC IMAGING:  None for this visit.      ASSESSMENT AND PLAN:  Ms.. Bocanegra is a pleasant 78 y.o. 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     Breast Cancer On Anastrozole with tolerable side effects (dry mouth). No new complaints or concerns. -Continue Anastrozole, plan to extend beyond 5 years due to patient's tolerance and risk profile.  Post-Stroke Right-Sided Weakness Improvement noted with physical therapy and occupational therapy. -Encourage continuation of home exercises.  Falls History of falls, most recently resulting in a head injury. Falls potentially related to left  knee issues and prior stroke causing right sided weakness -Continue to monitor and manage underlying causes to prevent future falls.  Medication Reconciliation Discrepancies noted in medication list. -Remove Ensure, Miralax, and Trimethoprim from medication list. -Keep Spiriva on list for as-needed use. -Refill Anastrozole to ensure adequate supply until next visit.  Follow-up in 1 year, or sooner if any concerns arise. *Total Encounter Time as defined by the Centers for Medicare and Medicaid Services includes, in addition to the face-to-face time of a patient visit (documented in the note above) non-face-to-face time: obtaining and reviewing outside history, ordering and reviewing medications, tests or procedures, care coordination (communications with other health care professionals or caregivers) and documentation in the medical record.

## 2023-07-23 DIAGNOSIS — E785 Hyperlipidemia, unspecified: Secondary | ICD-10-CM | POA: Diagnosis not present

## 2023-07-23 DIAGNOSIS — E1169 Type 2 diabetes mellitus with other specified complication: Secondary | ICD-10-CM | POA: Diagnosis not present

## 2023-07-23 DIAGNOSIS — D509 Iron deficiency anemia, unspecified: Secondary | ICD-10-CM | POA: Diagnosis not present

## 2023-07-23 DIAGNOSIS — M810 Age-related osteoporosis without current pathological fracture: Secondary | ICD-10-CM | POA: Diagnosis not present

## 2023-07-23 DIAGNOSIS — D519 Vitamin B12 deficiency anemia, unspecified: Secondary | ICD-10-CM | POA: Diagnosis not present

## 2023-07-30 DIAGNOSIS — C50019 Malignant neoplasm of nipple and areola, unspecified female breast: Secondary | ICD-10-CM | POA: Diagnosis not present

## 2023-07-30 DIAGNOSIS — E785 Hyperlipidemia, unspecified: Secondary | ICD-10-CM | POA: Diagnosis not present

## 2023-07-30 DIAGNOSIS — J449 Chronic obstructive pulmonary disease, unspecified: Secondary | ICD-10-CM | POA: Diagnosis not present

## 2023-07-30 DIAGNOSIS — M810 Age-related osteoporosis without current pathological fracture: Secondary | ICD-10-CM | POA: Diagnosis not present

## 2023-07-30 DIAGNOSIS — D509 Iron deficiency anemia, unspecified: Secondary | ICD-10-CM | POA: Diagnosis not present

## 2023-07-30 DIAGNOSIS — R82998 Other abnormal findings in urine: Secondary | ICD-10-CM | POA: Diagnosis not present

## 2023-07-30 DIAGNOSIS — E1169 Type 2 diabetes mellitus with other specified complication: Secondary | ICD-10-CM | POA: Diagnosis not present

## 2023-07-30 DIAGNOSIS — C50911 Malignant neoplasm of unspecified site of right female breast: Secondary | ICD-10-CM | POA: Diagnosis not present

## 2023-07-30 DIAGNOSIS — I69961 Other paralytic syndrome following unspecified cerebrovascular disease affecting right dominant side: Secondary | ICD-10-CM | POA: Diagnosis not present

## 2023-07-30 DIAGNOSIS — Z0000 Encounter for general adult medical examination without abnormal findings: Secondary | ICD-10-CM | POA: Diagnosis not present

## 2023-07-30 DIAGNOSIS — I700 Atherosclerosis of aorta: Secondary | ICD-10-CM | POA: Diagnosis not present

## 2023-07-30 DIAGNOSIS — L918 Other hypertrophic disorders of the skin: Secondary | ICD-10-CM | POA: Diagnosis not present

## 2023-07-30 DIAGNOSIS — Z1339 Encounter for screening examination for other mental health and behavioral disorders: Secondary | ICD-10-CM | POA: Diagnosis not present

## 2023-07-30 DIAGNOSIS — Z1331 Encounter for screening for depression: Secondary | ICD-10-CM | POA: Diagnosis not present

## 2023-07-30 DIAGNOSIS — F330 Major depressive disorder, recurrent, mild: Secondary | ICD-10-CM | POA: Diagnosis not present

## 2023-08-27 DIAGNOSIS — L820 Inflamed seborrheic keratosis: Secondary | ICD-10-CM | POA: Diagnosis not present

## 2024-05-02 ENCOUNTER — Inpatient Hospital Stay: Payer: Medicare Other | Admitting: Hematology and Oncology
